# Patient Record
Sex: Male | Born: 1940
Health system: Southern US, Community
[De-identification: ages and names within clinical notes are randomized; demographics above are authoritative.]

## PROBLEM LIST (undated history)

## (undated) DIAGNOSIS — I447 Left bundle-branch block, unspecified: Secondary | ICD-10-CM

## (undated) DIAGNOSIS — M1990 Unspecified osteoarthritis, unspecified site: Secondary | ICD-10-CM

## (undated) DIAGNOSIS — G4733 Obstructive sleep apnea (adult) (pediatric): Secondary | ICD-10-CM

## (undated) DIAGNOSIS — I712 Thoracic aortic aneurysm, without rupture: Secondary | ICD-10-CM

## (undated) DIAGNOSIS — I350 Nonrheumatic aortic (valve) stenosis: Secondary | ICD-10-CM

## (undated) DIAGNOSIS — I428 Other cardiomyopathies: Secondary | ICD-10-CM

## (undated) DIAGNOSIS — Z95810 Presence of automatic (implantable) cardiac defibrillator: Secondary | ICD-10-CM

## (undated) DIAGNOSIS — E119 Type 2 diabetes mellitus without complications: Secondary | ICD-10-CM

## (undated) DIAGNOSIS — K8020 Calculus of gallbladder without cholecystitis without obstruction: Secondary | ICD-10-CM

## (undated) DIAGNOSIS — I7121 Aneurysm of the ascending aorta, without rupture: Secondary | ICD-10-CM

## (undated) DIAGNOSIS — I10 Essential (primary) hypertension: Secondary | ICD-10-CM

## (undated) DIAGNOSIS — I4891 Unspecified atrial fibrillation: Secondary | ICD-10-CM

## (undated) HISTORY — DX: Aneurysm of the ascending aorta, without rupture: I71.21

## (undated) HISTORY — DX: Essential (primary) hypertension: I10

## (undated) HISTORY — DX: Nonrheumatic aortic (valve) stenosis: I35.0

## (undated) HISTORY — PX: MEDIAN STERNOTOMY: SUR860

## (undated) HISTORY — PX: TONSILLECTOMY: SUR1361

## (undated) HISTORY — DX: Left bundle-branch block, unspecified: I44.7

## (undated) HISTORY — PX: AORTIC VALVE REPLACEMENT: SHX41

## (undated) HISTORY — DX: Obstructive sleep apnea (adult) (pediatric): G47.33

## (undated) HISTORY — DX: Presence of automatic (implantable) cardiac defibrillator: Z95.810

## (undated) HISTORY — DX: Thoracic aortic aneurysm, without rupture: I71.2

## (undated) HISTORY — DX: Unspecified atrial fibrillation: I48.91

## (undated) HISTORY — DX: Calculus of gallbladder without cholecystitis without obstruction: K80.20

## (undated) HISTORY — DX: Unspecified osteoarthritis, unspecified site: M19.90

## (undated) HISTORY — DX: Other cardiomyopathies: I42.8

## (undated) HISTORY — PX: APPENDECTOMY: SHX54

## (undated) HISTORY — PX: CHOLECYSTECTOMY: SHX55

---

## 1999-07-13 ENCOUNTER — Encounter: Payer: Self-pay | Admitting: Family Medicine

## 1999-07-13 ENCOUNTER — Encounter: Admission: RE | Admit: 1999-07-13 | Discharge: 1999-07-13 | Payer: Self-pay | Admitting: Family Medicine

## 1999-07-31 ENCOUNTER — Ambulatory Visit (HOSPITAL_COMMUNITY): Admission: RE | Admit: 1999-07-31 | Discharge: 1999-07-31 | Payer: Self-pay | Admitting: Neurosurgery

## 1999-07-31 ENCOUNTER — Encounter: Payer: Self-pay | Admitting: Neurosurgery

## 1999-08-04 HISTORY — PX: CERVICAL DISCECTOMY: SHX98

## 1999-08-06 ENCOUNTER — Encounter: Payer: Self-pay | Admitting: Neurosurgery

## 1999-08-10 ENCOUNTER — Inpatient Hospital Stay (HOSPITAL_COMMUNITY): Admission: RE | Admit: 1999-08-10 | Discharge: 1999-08-11 | Payer: Self-pay | Admitting: Neurosurgery

## 1999-08-10 ENCOUNTER — Encounter: Payer: Self-pay | Admitting: Neurosurgery

## 1999-09-02 ENCOUNTER — Encounter: Admission: RE | Admit: 1999-09-02 | Discharge: 1999-09-02 | Payer: Self-pay | Admitting: Neurosurgery

## 1999-09-02 ENCOUNTER — Encounter: Payer: Self-pay | Admitting: Neurosurgery

## 1999-10-02 ENCOUNTER — Encounter: Payer: Self-pay | Admitting: Neurosurgery

## 1999-10-02 ENCOUNTER — Encounter: Admission: RE | Admit: 1999-10-02 | Discharge: 1999-10-02 | Payer: Self-pay | Admitting: Neurosurgery

## 2000-04-13 ENCOUNTER — Ambulatory Visit (HOSPITAL_BASED_OUTPATIENT_CLINIC_OR_DEPARTMENT_OTHER): Admission: RE | Admit: 2000-04-13 | Discharge: 2000-04-13 | Payer: Self-pay | Admitting: Orthopedic Surgery

## 2004-02-14 ENCOUNTER — Ambulatory Visit: Payer: Self-pay | Admitting: Internal Medicine

## 2004-12-23 ENCOUNTER — Encounter: Admission: RE | Admit: 2004-12-23 | Discharge: 2004-12-23 | Payer: Self-pay | Admitting: Internal Medicine

## 2005-08-12 ENCOUNTER — Emergency Department (HOSPITAL_COMMUNITY): Admission: EM | Admit: 2005-08-12 | Discharge: 2005-08-12 | Payer: Self-pay | Admitting: Emergency Medicine

## 2007-03-14 ENCOUNTER — Inpatient Hospital Stay (HOSPITAL_COMMUNITY): Admission: EM | Admit: 2007-03-14 | Discharge: 2007-03-16 | Payer: Self-pay | Admitting: Emergency Medicine

## 2007-07-28 ENCOUNTER — Inpatient Hospital Stay (HOSPITAL_COMMUNITY): Admission: EM | Admit: 2007-07-28 | Discharge: 2007-08-15 | Payer: Self-pay | Admitting: Emergency Medicine

## 2007-07-31 ENCOUNTER — Ambulatory Visit: Payer: Self-pay | Admitting: Thoracic Surgery (Cardiothoracic Vascular Surgery)

## 2007-07-31 ENCOUNTER — Encounter: Payer: Self-pay | Admitting: Cardiology

## 2007-08-01 ENCOUNTER — Encounter (INDEPENDENT_AMBULATORY_CARE_PROVIDER_SITE_OTHER): Payer: Self-pay | Admitting: *Deleted

## 2007-08-01 ENCOUNTER — Encounter: Payer: Self-pay | Admitting: Thoracic Surgery (Cardiothoracic Vascular Surgery)

## 2007-08-02 ENCOUNTER — Ambulatory Visit: Payer: Self-pay | Admitting: Surgery

## 2007-08-07 ENCOUNTER — Encounter: Payer: Self-pay | Admitting: Thoracic Surgery (Cardiothoracic Vascular Surgery)

## 2007-08-25 ENCOUNTER — Emergency Department (HOSPITAL_COMMUNITY): Admission: EM | Admit: 2007-08-25 | Discharge: 2007-08-25 | Payer: Self-pay | Admitting: Emergency Medicine

## 2007-09-07 ENCOUNTER — Encounter (HOSPITAL_COMMUNITY): Admission: RE | Admit: 2007-09-07 | Discharge: 2007-11-04 | Payer: Self-pay | Admitting: Cardiology

## 2007-09-07 ENCOUNTER — Ambulatory Visit: Payer: Self-pay | Admitting: Thoracic Surgery (Cardiothoracic Vascular Surgery)

## 2007-09-07 ENCOUNTER — Encounter
Admission: RE | Admit: 2007-09-07 | Discharge: 2007-09-07 | Payer: Self-pay | Admitting: Thoracic Surgery (Cardiothoracic Vascular Surgery)

## 2007-09-18 ENCOUNTER — Inpatient Hospital Stay (HOSPITAL_COMMUNITY): Admission: EM | Admit: 2007-09-18 | Discharge: 2007-09-22 | Payer: Self-pay | Admitting: Emergency Medicine

## 2007-09-29 ENCOUNTER — Encounter
Admission: RE | Admit: 2007-09-29 | Discharge: 2007-09-29 | Payer: Self-pay | Admitting: Thoracic Surgery (Cardiothoracic Vascular Surgery)

## 2007-09-29 ENCOUNTER — Ambulatory Visit: Payer: Self-pay | Admitting: Thoracic Surgery (Cardiothoracic Vascular Surgery)

## 2007-11-06 ENCOUNTER — Ambulatory Visit: Payer: Self-pay | Admitting: Thoracic Surgery (Cardiothoracic Vascular Surgery)

## 2007-11-06 ENCOUNTER — Encounter
Admission: RE | Admit: 2007-11-06 | Discharge: 2007-11-06 | Payer: Self-pay | Admitting: Thoracic Surgery (Cardiothoracic Vascular Surgery)

## 2008-03-18 ENCOUNTER — Ambulatory Visit: Payer: Self-pay | Admitting: Thoracic Surgery (Cardiothoracic Vascular Surgery)

## 2008-03-18 ENCOUNTER — Encounter
Admission: RE | Admit: 2008-03-18 | Discharge: 2008-03-18 | Payer: Self-pay | Admitting: Thoracic Surgery (Cardiothoracic Vascular Surgery)

## 2008-06-22 ENCOUNTER — Encounter: Payer: Self-pay | Admitting: Cardiology

## 2008-08-05 ENCOUNTER — Ambulatory Visit: Payer: Self-pay | Admitting: Thoracic Surgery (Cardiothoracic Vascular Surgery)

## 2008-11-21 ENCOUNTER — Encounter: Payer: Self-pay | Admitting: Cardiology

## 2008-11-27 ENCOUNTER — Ambulatory Visit (HOSPITAL_COMMUNITY): Admission: RE | Admit: 2008-11-27 | Discharge: 2008-11-27 | Payer: Self-pay | Admitting: Cardiovascular Disease

## 2008-12-04 ENCOUNTER — Encounter: Payer: Self-pay | Admitting: Cardiology

## 2008-12-06 ENCOUNTER — Encounter: Payer: Self-pay | Admitting: Cardiology

## 2008-12-09 ENCOUNTER — Encounter: Payer: Self-pay | Admitting: Cardiology

## 2009-01-03 ENCOUNTER — Encounter: Payer: Self-pay | Admitting: Cardiology

## 2009-01-22 ENCOUNTER — Encounter: Payer: Self-pay | Admitting: Cardiology

## 2009-05-06 DIAGNOSIS — R0602 Shortness of breath: Secondary | ICD-10-CM

## 2009-05-06 DIAGNOSIS — M1990 Unspecified osteoarthritis, unspecified site: Secondary | ICD-10-CM | POA: Insufficient documentation

## 2009-05-06 DIAGNOSIS — I447 Left bundle-branch block, unspecified: Secondary | ICD-10-CM

## 2009-05-06 DIAGNOSIS — Q231 Congenital insufficiency of aortic valve: Secondary | ICD-10-CM

## 2009-05-06 DIAGNOSIS — K8020 Calculus of gallbladder without cholecystitis without obstruction: Secondary | ICD-10-CM | POA: Insufficient documentation

## 2009-05-06 DIAGNOSIS — I359 Nonrheumatic aortic valve disorder, unspecified: Secondary | ICD-10-CM

## 2009-05-06 DIAGNOSIS — G4733 Obstructive sleep apnea (adult) (pediatric): Secondary | ICD-10-CM

## 2009-05-06 DIAGNOSIS — I959 Hypotension, unspecified: Secondary | ICD-10-CM

## 2009-05-06 DIAGNOSIS — I712 Thoracic aortic aneurysm, without rupture: Secondary | ICD-10-CM

## 2009-05-07 ENCOUNTER — Encounter: Payer: Self-pay | Admitting: Cardiology

## 2009-05-07 ENCOUNTER — Ambulatory Visit: Payer: Self-pay | Admitting: Cardiology

## 2009-05-07 DIAGNOSIS — I10 Essential (primary) hypertension: Secondary | ICD-10-CM

## 2009-05-07 DIAGNOSIS — Z954 Presence of other heart-valve replacement: Secondary | ICD-10-CM | POA: Insufficient documentation

## 2009-05-08 ENCOUNTER — Ambulatory Visit: Payer: Self-pay | Admitting: Internal Medicine

## 2009-05-08 LAB — CONVERTED CEMR LAB: POC INR: 1.9

## 2009-05-29 ENCOUNTER — Encounter (INDEPENDENT_AMBULATORY_CARE_PROVIDER_SITE_OTHER): Payer: Self-pay | Admitting: Cardiology

## 2009-05-29 ENCOUNTER — Ambulatory Visit: Payer: Self-pay | Admitting: Internal Medicine

## 2009-05-29 LAB — CONVERTED CEMR LAB: POC INR: 2.2

## 2009-06-26 ENCOUNTER — Ambulatory Visit: Payer: Self-pay | Admitting: Internal Medicine

## 2009-06-26 LAB — CONVERTED CEMR LAB: POC INR: 2

## 2009-07-16 ENCOUNTER — Encounter: Payer: Self-pay | Admitting: Cardiology

## 2009-07-16 ENCOUNTER — Ambulatory Visit: Payer: Self-pay | Admitting: Cardiology

## 2009-08-07 ENCOUNTER — Ambulatory Visit: Payer: Self-pay | Admitting: Thoracic Surgery (Cardiothoracic Vascular Surgery)

## 2009-08-07 ENCOUNTER — Encounter: Payer: Self-pay | Admitting: Cardiology

## 2009-08-08 ENCOUNTER — Ambulatory Visit: Payer: Self-pay | Admitting: Cardiology

## 2009-08-08 LAB — CONVERTED CEMR LAB: POC INR: 3.3

## 2009-08-29 ENCOUNTER — Ambulatory Visit: Payer: Self-pay | Admitting: Cardiovascular Disease

## 2009-09-17 ENCOUNTER — Encounter: Payer: Self-pay | Admitting: Cardiology

## 2009-09-17 ENCOUNTER — Encounter (INDEPENDENT_AMBULATORY_CARE_PROVIDER_SITE_OTHER): Payer: Self-pay | Admitting: *Deleted

## 2009-09-17 ENCOUNTER — Ambulatory Visit: Payer: Self-pay | Admitting: Cardiology

## 2009-09-26 ENCOUNTER — Ambulatory Visit: Payer: Self-pay | Admitting: Cardiology

## 2009-10-10 ENCOUNTER — Telehealth (INDEPENDENT_AMBULATORY_CARE_PROVIDER_SITE_OTHER): Payer: Self-pay | Admitting: *Deleted

## 2009-10-24 ENCOUNTER — Ambulatory Visit: Payer: Self-pay | Admitting: Cardiology

## 2009-10-24 LAB — CONVERTED CEMR LAB: POC INR: 1.7

## 2009-11-25 ENCOUNTER — Ambulatory Visit: Payer: Self-pay | Admitting: Cardiovascular Disease

## 2009-11-25 LAB — CONVERTED CEMR LAB: POC INR: 2.1

## 2009-11-26 ENCOUNTER — Encounter: Payer: Self-pay | Admitting: Cardiology

## 2009-11-26 ENCOUNTER — Ambulatory Visit: Payer: Self-pay | Admitting: Cardiology

## 2009-11-28 ENCOUNTER — Ambulatory Visit (HOSPITAL_COMMUNITY): Admission: RE | Admit: 2009-11-28 | Discharge: 2009-11-28 | Payer: Self-pay | Admitting: Cardiology

## 2009-11-28 ENCOUNTER — Ambulatory Visit: Payer: Self-pay

## 2009-11-28 ENCOUNTER — Encounter: Payer: Self-pay | Admitting: Cardiology

## 2009-11-28 ENCOUNTER — Ambulatory Visit: Payer: Self-pay | Admitting: Internal Medicine

## 2009-12-15 ENCOUNTER — Encounter (INDEPENDENT_AMBULATORY_CARE_PROVIDER_SITE_OTHER): Payer: Self-pay | Admitting: *Deleted

## 2009-12-15 ENCOUNTER — Telehealth: Payer: Self-pay | Admitting: Cardiology

## 2009-12-16 ENCOUNTER — Telehealth (INDEPENDENT_AMBULATORY_CARE_PROVIDER_SITE_OTHER): Payer: Self-pay | Admitting: *Deleted

## 2009-12-24 ENCOUNTER — Ambulatory Visit: Payer: Self-pay | Admitting: Cardiology

## 2009-12-24 ENCOUNTER — Encounter: Payer: Self-pay | Admitting: Cardiology

## 2009-12-26 LAB — CONVERTED CEMR LAB
BUN: 18 mg/dL (ref 6–23)
Creatinine, Ser: 0.9 mg/dL (ref 0.4–1.5)
GFR calc non Af Amer: 88.64 mL/min (ref >60)
Potassium: 4.2 meq/L (ref 3.5–5.1)
Pro B Natriuretic peptide (BNP): 120.4 pg/mL — ABNORMAL HIGH (ref 0.0–100.0)

## 2010-01-01 ENCOUNTER — Ambulatory Visit: Payer: Self-pay | Admitting: Cardiology

## 2010-01-13 ENCOUNTER — Ambulatory Visit: Payer: Self-pay | Admitting: Internal Medicine

## 2010-01-26 ENCOUNTER — Ambulatory Visit: Payer: Self-pay | Admitting: Internal Medicine

## 2010-01-26 DIAGNOSIS — I5022 Chronic systolic (congestive) heart failure: Secondary | ICD-10-CM

## 2010-02-04 ENCOUNTER — Encounter: Payer: Self-pay | Admitting: Internal Medicine

## 2010-02-09 ENCOUNTER — Ambulatory Visit: Payer: Self-pay | Admitting: Cardiovascular Disease

## 2010-02-09 ENCOUNTER — Encounter (INDEPENDENT_AMBULATORY_CARE_PROVIDER_SITE_OTHER): Payer: Self-pay | Admitting: *Deleted

## 2010-02-10 ENCOUNTER — Telehealth: Payer: Self-pay | Admitting: Internal Medicine

## 2010-02-18 ENCOUNTER — Ambulatory Visit: Payer: Self-pay | Admitting: Cardiology

## 2010-02-18 ENCOUNTER — Encounter: Payer: Self-pay | Admitting: Cardiology

## 2010-03-02 ENCOUNTER — Ambulatory Visit: Payer: Self-pay | Admitting: Internal Medicine

## 2010-03-02 LAB — CONVERTED CEMR LAB
Basophils Relative: 0.3 % (ref 0.0–3.0)
CO2: 31 meq/L (ref 19–32)
Chloride: 101 meq/L (ref 96–112)
Eosinophils Absolute: 0.2 10*3/uL (ref 0.0–0.7)
Hemoglobin: 16 g/dL (ref 13.0–17.0)
INR: 2.7 — ABNORMAL HIGH (ref 0.8–1.0)
MCHC: 35 g/dL (ref 30.0–36.0)
MCV: 91.4 fL (ref 78.0–100.0)
Monocytes Absolute: 0.5 10*3/uL (ref 0.1–1.0)
Neutro Abs: 5.6 10*3/uL (ref 1.4–7.7)
RBC: 5 M/uL (ref 4.22–5.81)
Sodium: 140 meq/L (ref 135–145)

## 2010-03-05 ENCOUNTER — Ambulatory Visit: Payer: Self-pay | Admitting: Internal Medicine

## 2010-03-05 ENCOUNTER — Inpatient Hospital Stay (HOSPITAL_COMMUNITY)
Admission: RE | Admit: 2010-03-05 | Discharge: 2010-03-08 | Payer: Self-pay | Source: Home / Self Care | Admitting: Internal Medicine

## 2010-03-09 ENCOUNTER — Ambulatory Visit: Payer: Self-pay | Admitting: Cardiovascular Disease

## 2010-03-13 ENCOUNTER — Encounter: Payer: Self-pay | Admitting: Internal Medicine

## 2010-03-17 ENCOUNTER — Encounter: Payer: Self-pay | Admitting: Internal Medicine

## 2010-03-17 ENCOUNTER — Encounter: Payer: Self-pay | Admitting: Cardiology

## 2010-03-17 ENCOUNTER — Ambulatory Visit: Payer: Self-pay | Admitting: Cardiology

## 2010-03-17 LAB — CONVERTED CEMR LAB
CO2: 31 meq/L (ref 19–32)
Calcium: 9.3 mg/dL (ref 8.4–10.5)
Chloride: 102 meq/L (ref 96–112)
Glucose, Bld: 162 mg/dL — ABNORMAL HIGH (ref 70–99)
Magnesium: 2.2 mg/dL (ref 1.5–2.5)
Potassium: 4.2 meq/L (ref 3.5–5.1)
Sodium: 140 meq/L (ref 135–145)

## 2010-04-02 IMAGING — CR DG CHEST 1V PORT
1 series · 1 of 1 positions shown · non-contrast
Comparison: 07/28/2007

CLINICAL DATA: CHF, respiratory distress.

PORTABLE CHEST - 1 VIEW

[AP]
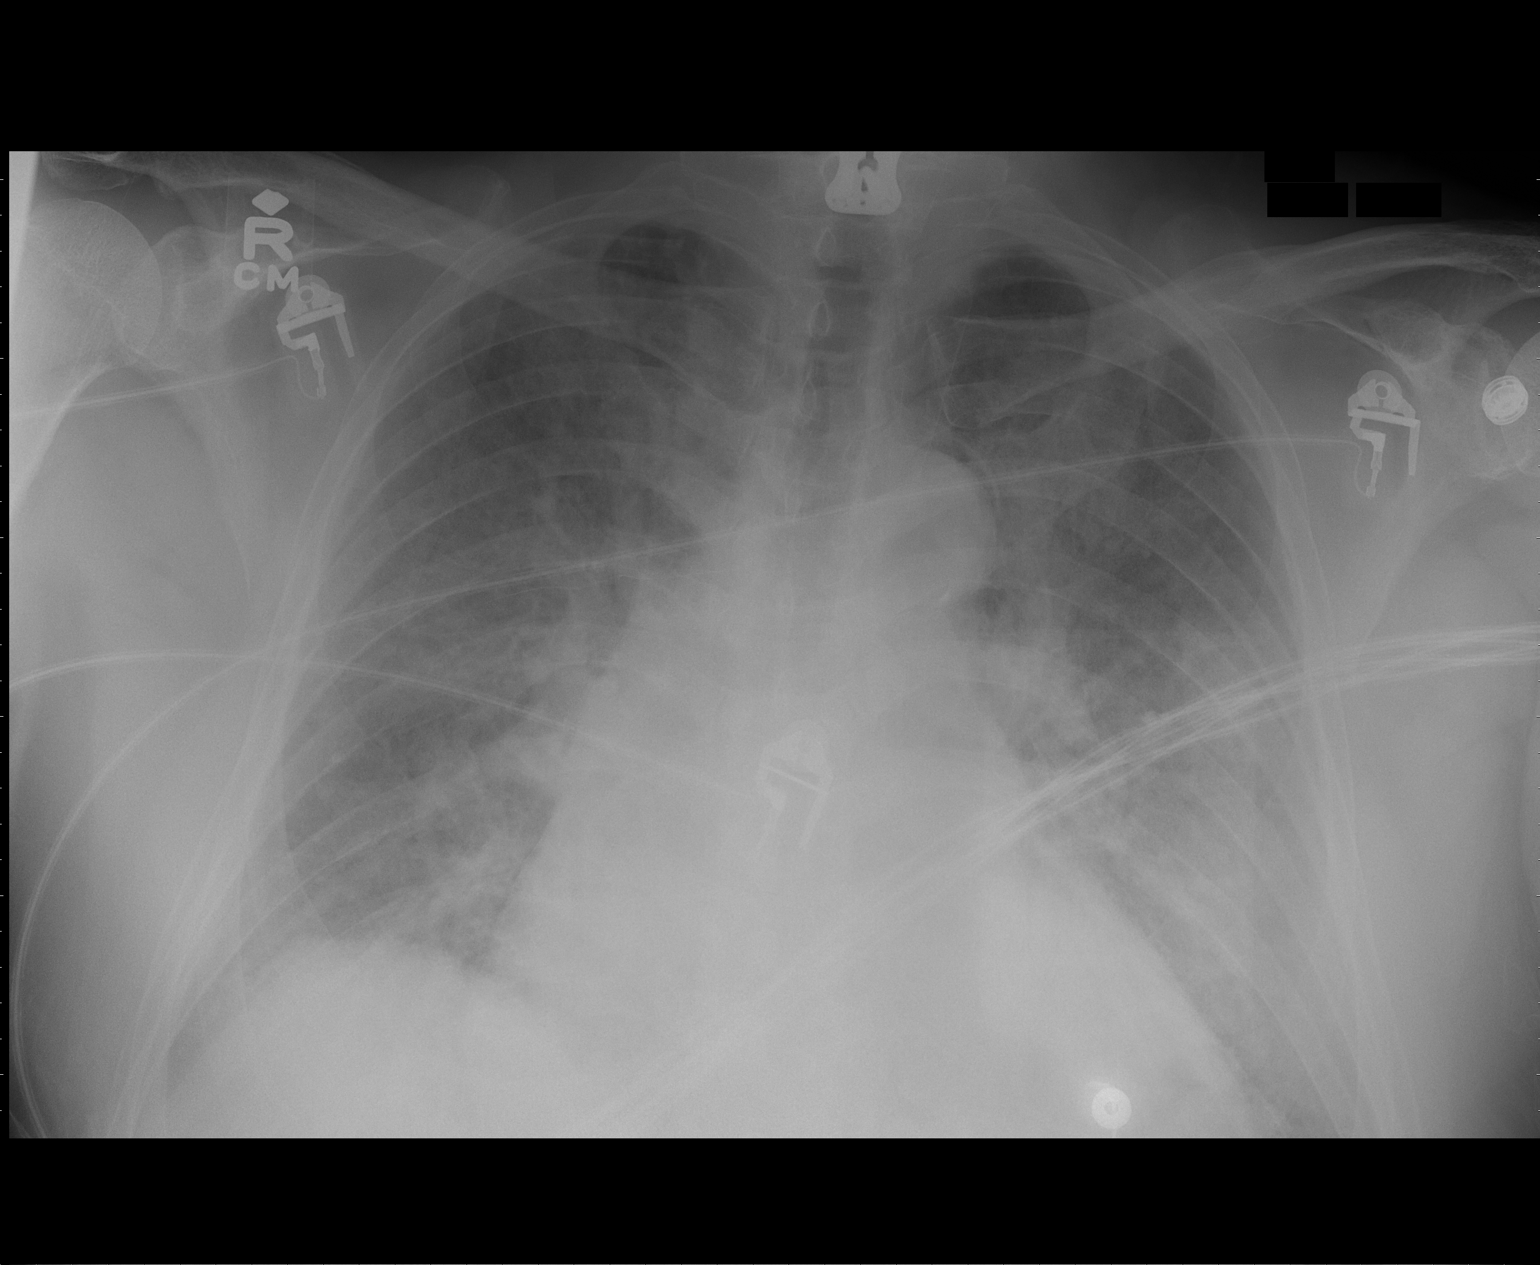

[1 of 1 positions shown; findings below may reference images not displayed]

FINDINGS: Compared to prior exam, cardiomegaly is unchanged.  There
has been an increase in pulmonary edema, most evident in the upper
lobes bilaterally.  Left costophrenic angle excluded from view.
Pulmonary edema is now graded as moderate to severe.  Lower
cervical ACDF again noted.
IMPRESSION: 1.  Increasing pulmonary edema/congestive heart failure.

## 2010-04-08 ENCOUNTER — Ambulatory Visit: Admission: RE | Admit: 2010-04-08 | Discharge: 2010-04-08 | Payer: Self-pay | Source: Home / Self Care

## 2010-04-08 IMAGING — CR DG CHEST 1V PORT
1 series · 1 of 1 positions shown · non-contrast
Comparison: 08/02/2007

CLINICAL DATA: Status post CABG.  CHF, AVR, MAZE

PORTABLE CHEST - 1 VIEW

[AP]
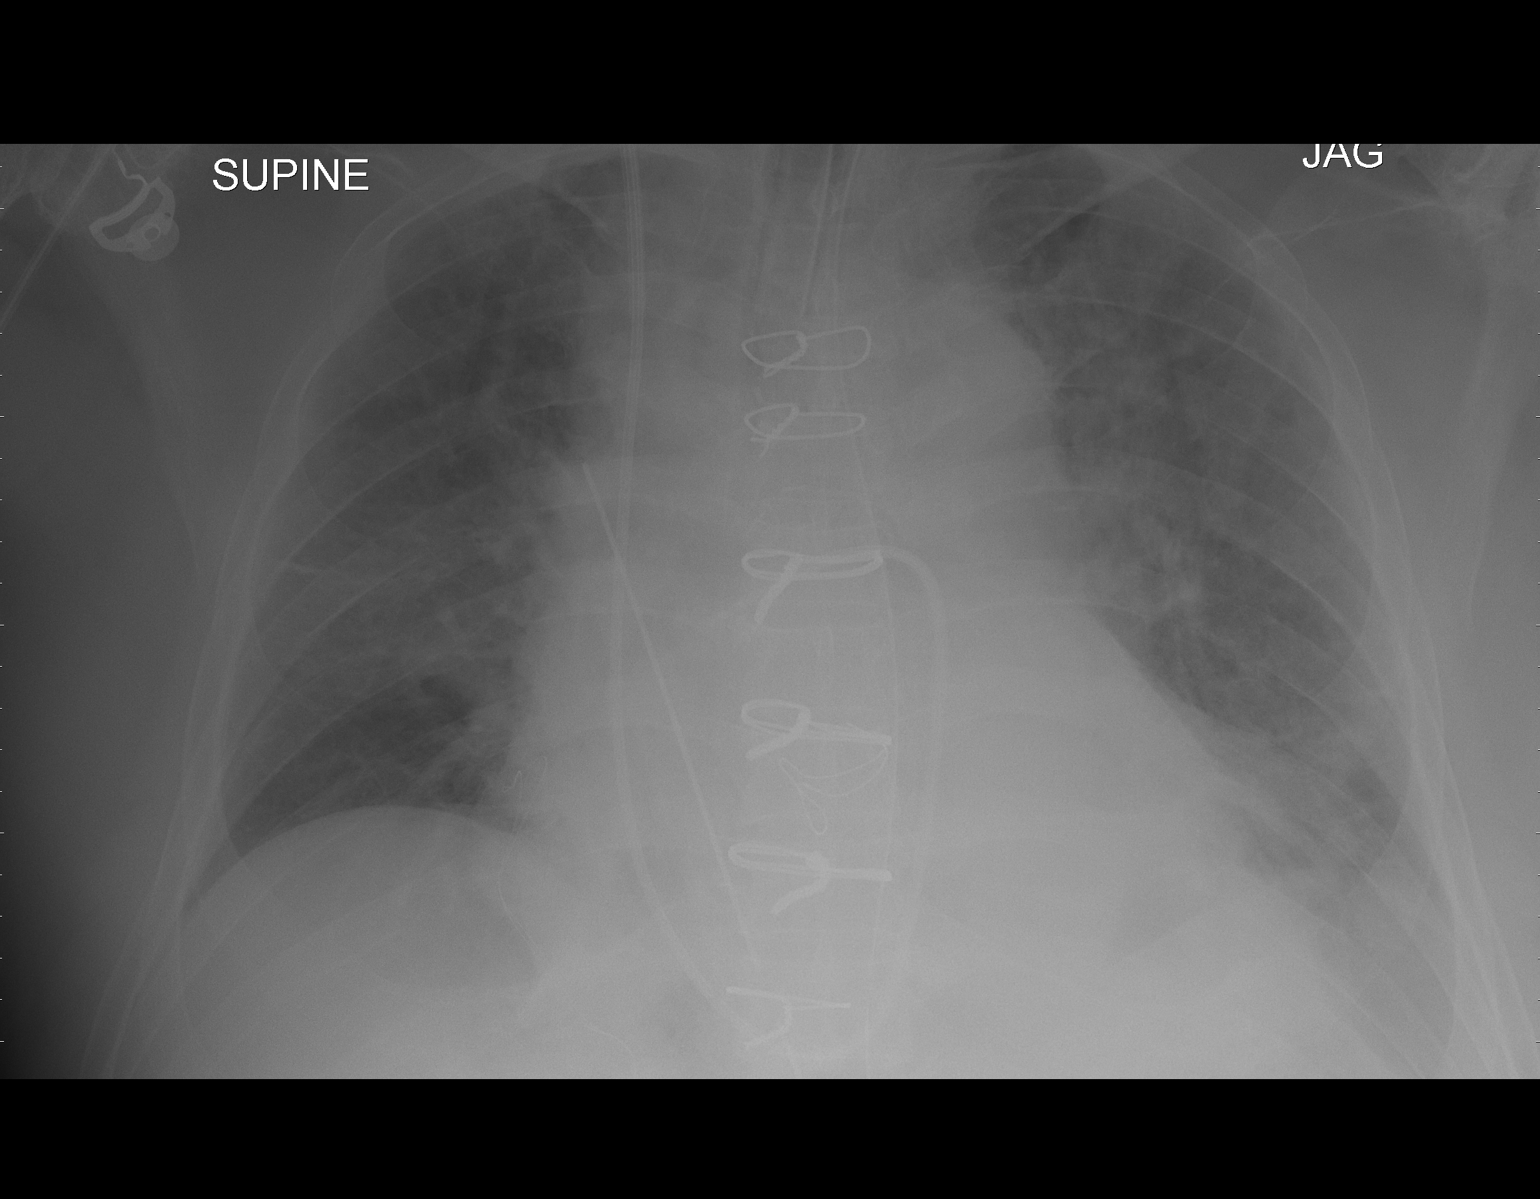

[1 of 1 positions shown; findings below may reference images not displayed]

FINDINGS: Patient has had a median sternotomy and CABG.
Endotracheal tube is in place with tip 5.6 cm above carina.
Mediastinal drains are in place.  A Swan-Ganz catheter tip overlies
the level of the right main pulmonary artery.  The heart is
enlarged.  There are patchy densities especially in the perihilar
regions, consistent with mild edema.  Left lower lobe atelectasis
is suspected.
IMPRESSION: Postoperative appearance of the chest.  Cardiomegaly and perihilar
edema.

## 2010-04-09 IMAGING — CR DG CHEST 1V PORT
1 series · 1 of 1 positions shown · non-contrast
Comparison: Chest radiograph 08/07/2007

CLINICAL DATA: Congestive heart failure

PORTABLE CHEST - 1 VIEW

[view not recorded]
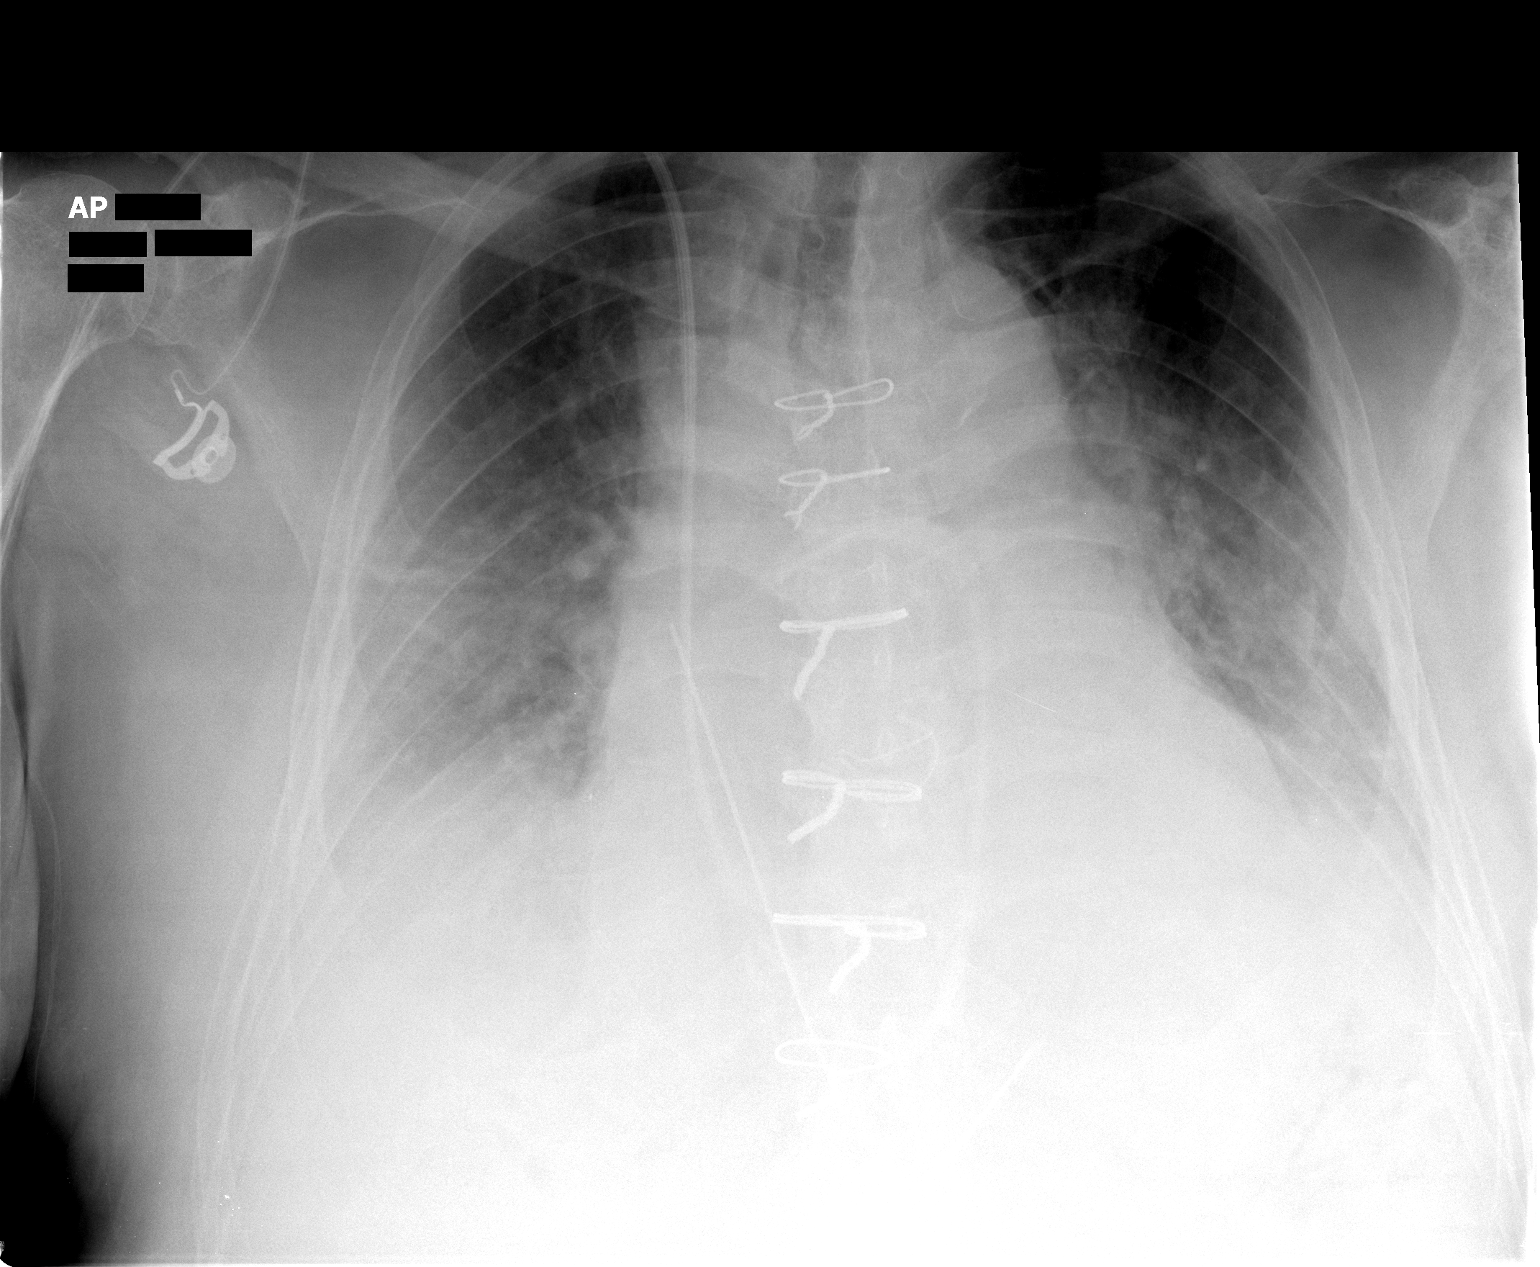

[1 of 1 positions shown; findings below may reference images not displayed]

FINDINGS: Sternotomy wires overlie stable enlarged cardiac
silhouette.  Interval extubation.  There is increase in hazy
opacity over the bilateral lung bases.  Mediastinal drain remains.
Swan-Ganz catheter unchanged.  No pneumothorax.  Mild to moderate
central venous, congestion.
IMPRESSION: 1.  Interval extubation with increase in bibasilar atelectasis and
effusions.
2.  Stable central venous congestion.

## 2010-04-11 IMAGING — CR DG CHEST 2V
2 series · 2 of 2 positions shown · non-contrast
Comparison: 08/09/2007 study.

CLINICAL DATA: Coronary artery bypass grafting.  Follow-up. History
of aortic valvular replacement.

CHEST - 2 VIEW

[w chest pa]
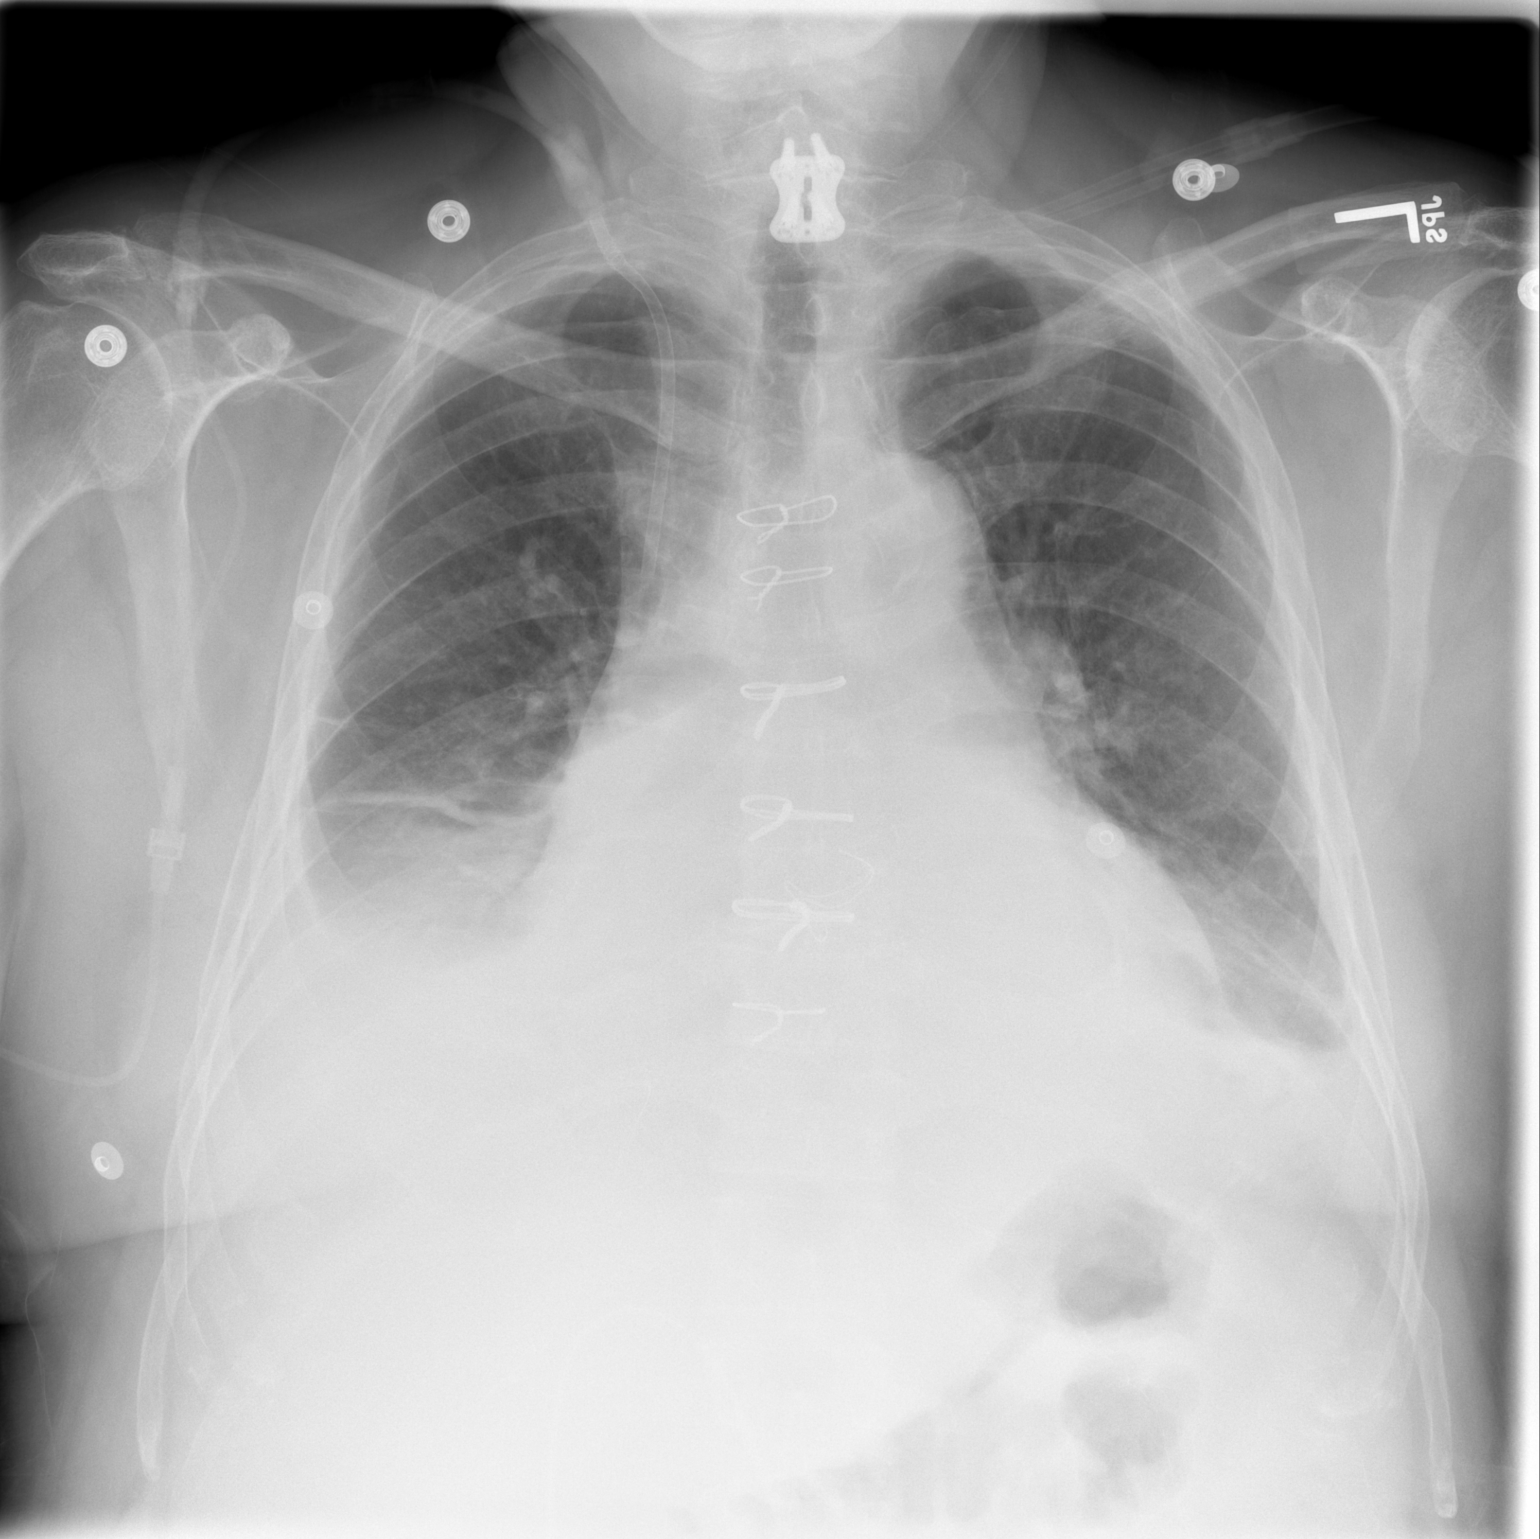

[w chest lat]
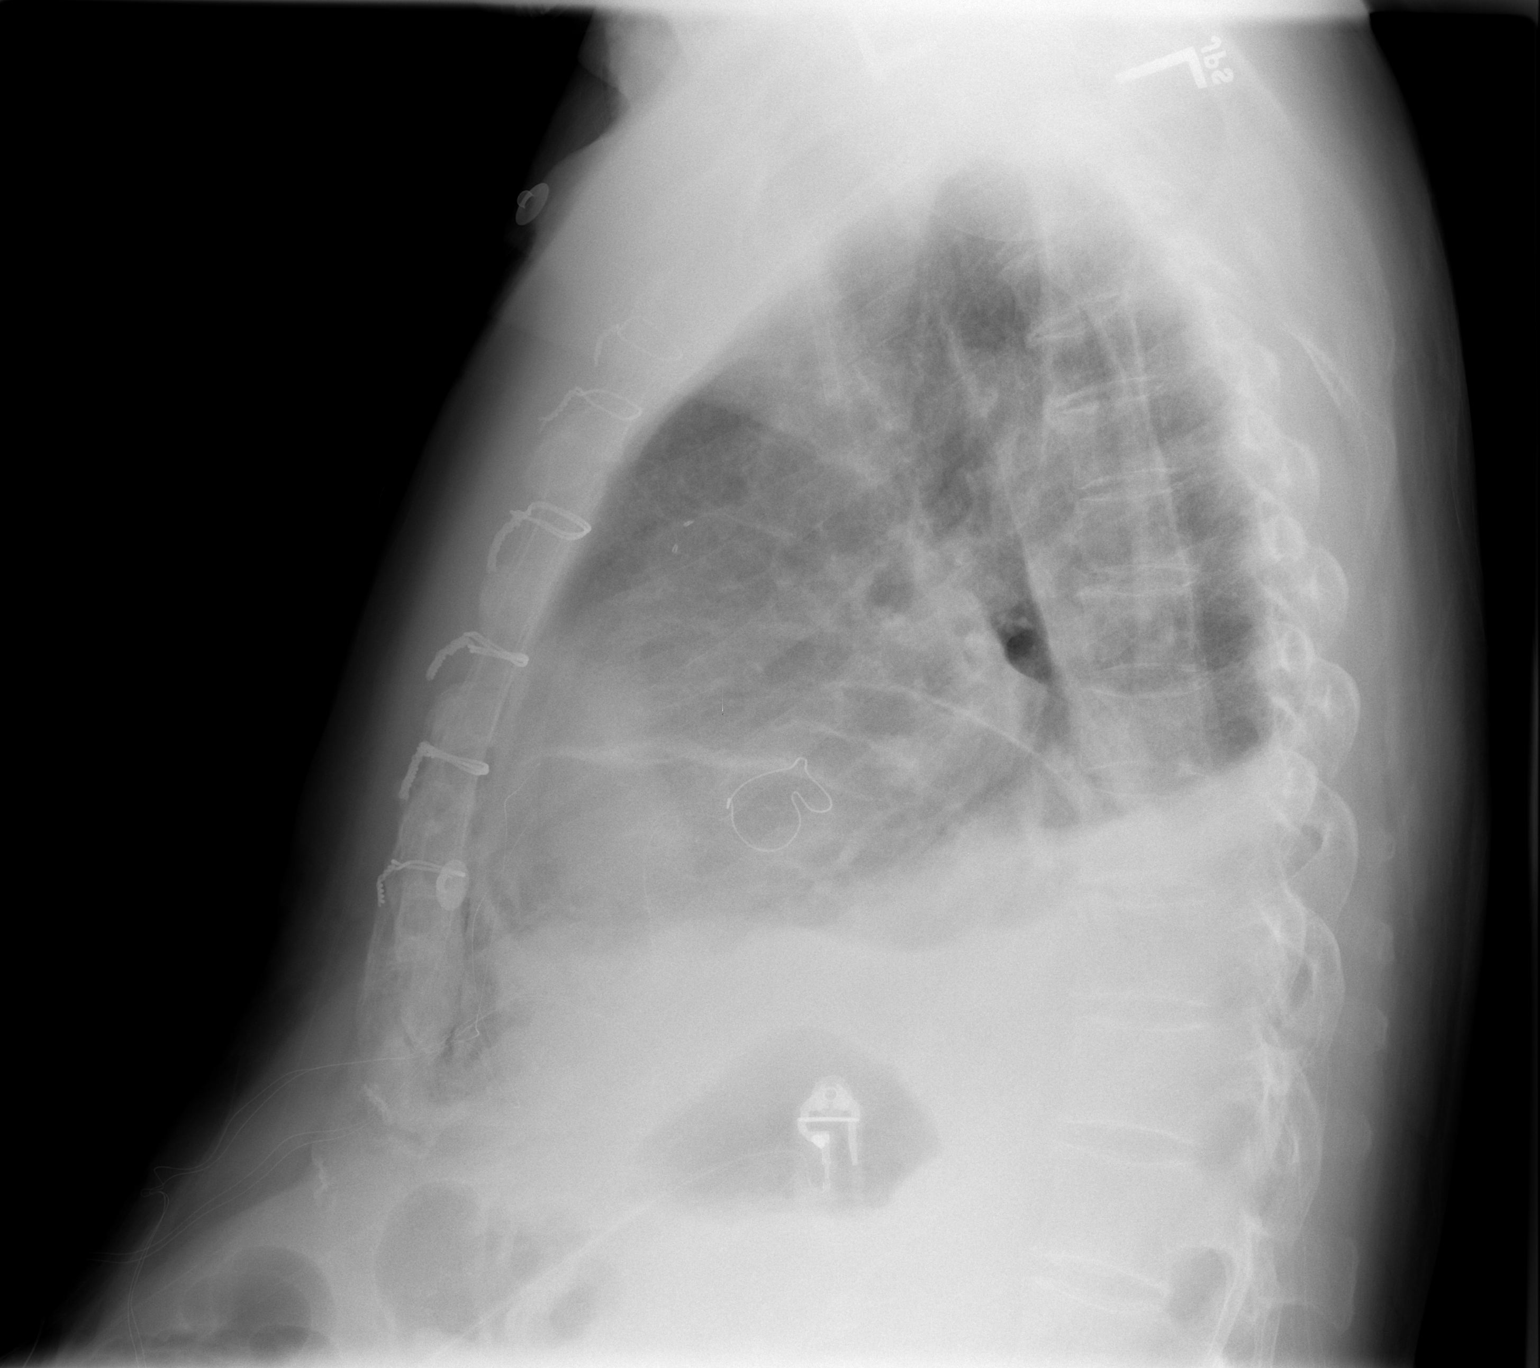

[2 of 2 positions shown; findings below may reference images not displayed]

FINDINGS: Previous median sternotomy and valvular replacement
procedure has been performed.  Previous lower cervical surgery has
been performed with hardware in place.

A venous catheter is seen in place with its tip in the superior
vena cava with no evidence of pneumothorax.

There is continued enlargement of the cardiac silhouette.  There is
bilateral basilar atelectasis and pleural effusion formation more
on the right than the left.  However, there is improved aeration of
the lungs in the mid upper portions compared to the prior study.
IMPRESSION: Continued enlargement of the cardiac silhouette with bilateral
basilar atelectasis and pleural effusion formation.  Improved
aeration of the mid and upper lung zones with less vascular
congestion pattern than on the previous study.

## 2010-04-26 ENCOUNTER — Encounter: Payer: Self-pay | Admitting: Thoracic Surgery (Cardiothoracic Vascular Surgery)

## 2010-05-05 NOTE — Medication Information (Signed)
Summary: [name]  Anticoagulant Therapy  Managed by: Weston Brass, PharmD Referring MD: Jens Som MD, Beryle Lathe MD: Gala Romney MD, Reuel Boom Indication 1: Atrial Fibrillation Lab Used: [facility] [place] Site: [address] INR POC 1.9 INR RANGE 2.0-3.0  Dietary changes: yes       Details: Was drinking 2 glasses of wine previously, but has now stopped all alcohol  Health status changes: no    Bleeding/hemorrhagic complications: no    Recent/future hospitalizations: no    Any changes in medication regimen? no    Recent/future dental: no  Any missed doses?: no       Is patient compliant with meds? yes       Allergies: No Known Drug Allergies  Anticoagulation Management History:      The patient is taking warfarin and comes in today for a routine follow up visit.  Positive risk factors for bleeding include an age of 71 years or older.  The bleeding index is 'intermediate risk'.  Positive CHADS2 values include History of HTN.  Negative CHADS2 values include Age > 75 years old.  Anticoagulation responsible provider: Kiearra Oyervides MD, Reuel Boom.  INR POC: 1.9.  Cuvette Lot#: [id number].  Exp: 02/2011.    Anticoagulation Management Assessment/Plan:      The patient's current anticoagulation dose is Warfarin sodium 5 mg tabs: Use as directed by Anticoagulation Clinic.  The target INR is 2.0-3.0.  The next INR is due 01/26/2010.  Anticoagulation instructions were given to patient.  Results were reviewed/authorized by Weston Brass, PharmD.  He was notified by Haynes Hoehn, PharmD Candidate.         Prior Anticoagulation Instructions: INR 1.6  Take 1 extra tablet today (Thursday). Then change dose to 1 tablet everyday. Re-check INR in 10 days.   Current Anticoagulation Instructions: INR 1.9  Take Coumadin 1 tablet every day of the week, except 1 and 1/2 tablets on Wednesday.  Return to clinic in 2 weeks.

## 2010-05-05 NOTE — Cardiovascular Report (Signed)
Summary: Pre Op Orders   Pre Op Orders   Imported By: Roderic Ovens 02/23/2010 16:44:35  _____________________________________________________________________  External Attachment:    Type:   Image     Comment:   External Document

## 2010-05-05 NOTE — Medication Information (Signed)
Summary: [email or web address]  Anticoagulant Therapy  Managed by: Charolotte Eke, PharmD Referring MD: Jens Som MD, Beryle Lathe MD: Gala Romney MD, Reuel Boom Indication 1: Atrial Fibrillation Lab Used: [facility] [place] Site: [address] INR POC 2.0 INR RANGE 2.0-3.0  Dietary changes: no    Health status changes: no    Bleeding/hemorrhagic complications: no    Recent/future hospitalizations: no    Any changes in medication regimen? no    Recent/future dental: no  Any missed doses?: no       Is patient compliant with meds? yes       Current Medications (verified): 1)  Diltiazem Hcl Er Beads 240 Mg Xr24h-Cap (Diltiazem Hcl Er Beads) .... Take One Capsule By Mouth Daily 2)  Lisinopril-Hydrochlorothiazide 20-12.5 Mg Tabs (Lisinopril-Hydrochlorothiazide) .... Take 1 Tablet By Mouth Once A Day 3)  Digoxin 0.125 Mg Tabs (Digoxin) .... Take A Half  Tablet By Mouth Daily 4)  Warfarin Sodium 5 Mg Tabs (Warfarin Sodium) .... Use As Directed By Anticoagulation Clinic 5)  Klor-Con M20 20 Meq Cr-Tabs (Potassium Chloride Crys Cr) .... Take 1 Tablet By Mouth Once A Day  Allergies (verified): No Known Drug Allergies  Anticoagulation Management History:      Positive risk factors for bleeding include an age of 31 years or older.  The bleeding index is 'intermediate risk'.  Positive CHADS2 values include History of HTN.  Negative CHADS2 values include Age > 72 years old.  Anticoagulation responsible provider: Bensimhon MD, Reuel Boom.  INR POC: 2.0.  Exp: 07/2010.    Anticoagulation Management Assessment/Plan:      The patient's current anticoagulation dose is Warfarin sodium 5 mg tabs: Use as directed by Anticoagulation Clinic.  The next INR is due 07/24/2009.  Anticoagulation instructions were given to patient.  Results were reviewed/authorized by Charolotte Eke, PharmD.  He was notified by Charolotte Eke, PharmD.         Prior Anticoagulation Instructions: INR 2.2  Continue 1 tab daily  except 0.5 tab each Wednesday.  Recheck in 4 weeks.    Current Anticoagulation Instructions: The patient is to continue with the same dose of coumadin.  This dosage includes: 5mg  daily except 2.5mg  on Wed.

## 2010-05-05 NOTE — Assessment & Plan Note (Signed)
Summary: [facility]   Visit Type:  2 months follow up  CC:  No cardiac complains.  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 Note a cardiac catheterization in April of 2009 prior to his procedure showed an ejection fraction of 20%, no coronary disease, a dilated aortic root, moderate aortic stenosis and mild mitral regurgitation. The patient subsequently had aortic valve replacement with 25 mm Carpentier-Edwards Perimount valve, placement of ascending aortic aneurysm with 38-mm Hemashield graft, modified Cox maze III atrial ablation. Note he did develop atrial fibrillation 6 months after his procedure. A cardioversion was attempted by his report but he only held sinus for one week. He has been in permanent atrial fibrillation for the past 10-12 months. Patient previously followed by [facility] heart [facility] vascular but would prefer to be followed here. Last echocardiogram in [place] during an admission for cholecystitis was performed in September of 2010. Ejection fraction was 30-35%, moderate mitral regurgitation and mild aortic insufficiency. There was mild aortic stenosis by report. I last saw him in February of 2011. Since then the patient denies any dyspnea on exertion, orthopnea, PND, pedal edema, palpitations, syncope, chest pain or bleeding.   Current Medications (verified): 1)  Diltiazem Hcl Er Beads 240 Mg Xr24h-Cap (Diltiazem Hcl Er Beads) .... Take One Capsule By Mouth Daily 2)  Lisinopril-Hydrochlorothiazide 20-12.5 Mg Tabs (Lisinopril-Hydrochlorothiazide) .... Take 1 Tablet By Mouth Once A Day 3)  Digoxin 0.125 Mg Tabs (Digoxin) .... Take A Half  Tablet By Mouth Daily 4)  Warfarin Sodium 5 Mg Tabs (Warfarin Sodium) .... Use As Directed By Anticoagulation Clinic 5)  Klor-Con M20 20 Meq Cr-Tabs (Potassium  Chloride Crys Cr) .... Take 1 Tablet By Mouth Once A Day  Allergies (verified): No Known Drug Allergies  Past History:  Past Medical History: Reviewed history from 05/07/2009 and no changes required. 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   Past Surgical History: Reviewed history from 05/07/2009 and no changes required.  Median sternotomy extracorporeal circulation, aortic valve  replacement with 25 mm Carpentier-Edwards Perimount valve, placement of  ascending aortic aneurysm with 38-mm Hemashield graft, modified Cox maze   III atrial ablation.   repair of an ascending aortic aneurysm  Cox maze  procedure a year ago   Pleural effusion status post left-sided thoracentesis with extraction of 1.2 L of bloody fluid.   appendectomy and Tonsillectomy as a child.  Status post C7 disk removal and fusion in May 2001.   Cholecystectomy  Social History: Reviewed history from 05/07/2009 and no changes required. 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  He has two children and one grandchild.  Tobacco Use - Former.  Alcohol Use - no  Review of Systems       no fevers or chills, productive cough, hemoptysis, dysphasia, odynophagia, melena, hematochezia, dysuria, hematuria, rash, seizure activity, orthopnea, PND, pedal edema, claudication. Remaining systems are negative.   Vital Signs:  Patient profile:   70 year old male Height:      70 inches Weight:  210.50 pounds BMI:     30.31 Pulse rate:   68 / minute Pulse rhythm:   regular Resp:     18 per  minute BP sitting:   118 / 80  (left arm) Cuff size:   large  Vitals Entered By: Vikki Ports (July 16, 2009 10:00 AM)  Physical Exam  General:  Well-developed well-nourished in no acute distress.  Skin is warm and dry.  HEENT is normal.  Neck is supple. No thyromegaly.  Chest is clear to auscultation with normal expansion.  Cardiovascular exam is irregular. Crisp mechanical valve sounds. 2/6 systolic murmur but no diastolic murmur. Abdominal exam nontender or distended. No masses palpated. Extremities show no edema. neuro grossly intact    Impression & Recommendations:  Problem # 1:  FIBRILLATION, ATRIAL (ICD-427.31) Patient remains in atrial fibrillation on physical examination. Continue Coumadin with goal INR 2-3. Given the cardiomyopathy I would prefer that he be on a beta blocker. I will decrease his Cardizem to 120 mg p.o. daily and add Coreg 6.25 mg p.o. b.i.d. He will return in 8 weeks and I will try to discontinue his Cardizem and increase Coreg further. He will then need a Holter monitor to make sure that his rate is controlled. His updated medication list for this problem includes:    Digoxin 0.125 Mg Tabs (Digoxin) .Marland Kitchen... Take a half  tablet by mouth daily    Warfarin Sodium 5 Mg Tabs (Warfarin sodium) ..... Use as directed by anticoagulation clinic  Problem # 2:  COUMADIN THERAPY (ICD-V58.61) Goal INR 2-3.  Problem # 3:  ESSENTIAL HYPERTENSION, BENIGN (ICD-401.1) Blood pressure controlled on present medications. Will continue. His updated medication list for this problem includes:    Diltiazem Hcl Er Beads 120 Mg Xr24h-cap (Diltiazem hcl er beads) .Marland Kitchen... Take one capsule by mouth daily    Lisinopril-hydrochlorothiazide 20-12.5 Mg Tabs (Lisinopril-hydrochlorothiazide) .Marland Kitchen... Take 1 tablet by mouth once a day    Carvedilol 6.25 Mg Tabs (Carvedilol) .Marland Kitchen... Take one tablet by mouth twice a day  His updated medication list for this problem includes:    Diltiazem Hcl Er  Beads 120 Mg Xr24h-cap (Diltiazem hcl er beads) .Marland Kitchen... Take one capsule by mouth daily    Lisinopril-hydrochlorothiazide 20-12.5 Mg Tabs (Lisinopril-hydrochlorothiazide) .Marland Kitchen... Take 1 tablet by mouth once a day    Carvedilol 6.25 Mg Tabs (Carvedilol) .Marland Kitchen... 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 Our goal will be to increase Coreg in the future and discontinue calcium blocker altogether. He will then need a followup echocardiogram and if ejection fraction less than or equal to 35% referral to EP for consideration of ICD. His updated medication list for this problem includes:    Diltiazem Hcl Er Beads 120 Mg Xr24h-cap (Diltiazem hcl er beads) .Marland Kitchen... Take one capsule by mouth daily    Lisinopril-hydrochlorothiazide 20-12.5 Mg Tabs (Lisinopril-hydrochlorothiazide) .Marland Kitchen... Take 1 tablet by mouth once a day    Digoxin 0.125 Mg Tabs (Digoxin) .Marland Kitchen... Take a half  tablet by mouth daily    Warfarin Sodium 5 Mg Tabs (Warfarin sodium) ..... Use as directed by anticoagulation clinic    Carvedilol 6.25 Mg Tabs (Carvedilol) .Marland Kitchen... Take one tablet by mouth twice a day  Problem # 5:  AORTIC VALVE REPLACEMENT, HX OF (ICD-V43.3) Continued SBE prophylaxis.  Problem # 6:  ASCENDING AORTIC ANEURYSM (ICD-441.2) Followup MRA in the future.  Problem # 7:  SLEEP APNEA, OBSTRUCTIVE (ICD-327.23)  Patient Instructions: 1)  Your physician recommends that you schedule a follow-up appointment in: 8 WEEKS 2)  Your physician has recommended you make the following change in your medication: DECREASE DILTIAZEM TO 120MG  ONCE DAILY 3)  START CARVEDILOL 6.25MG  ONE TABLET TWICE DAILY Prescriptions: CARVEDILOL 6.25 MG TABS (CARVEDILOL) Take one tablet by mouth twice a day  #60 x 12   Entered by:   Deliah Goody, RN   Authorized by:   Ferman Hamming, MD, [facility]   Signed by:   Deliah Goody, RN on 07/16/2009    Method used:   Electronically to        [facility] [address] [facility] [address] (retail)       [address]       [place], [place]  [address]       Ph: [phone] or [phone]       Fax: [phone]   RxID:   [id number] DILTIAZEM HCL ER BEADS 120 MG XR24H-CAP (DILTIAZEM HCL ER BEADS) Take one capsule by mouth daily  #30 x 12   Entered by:   Deliah Goody, RN   Authorized by:   Ferman Hamming, MD, [facility]   Signed by:   Deliah Goody, RN on 07/16/2009   Method used:   Electronically to        [facility] [address] (retail)       [address]       [place], [place]  [address]       Ph: [phone] or [phone]       Fax: [phone]   RxID:   [id number]

## 2010-05-05 NOTE — Progress Notes (Signed)
Summary: calling about labs  Phone Note Call from Patient Call back at Home Phone [phone]   Caller: Patient Summary of Call: pt calling to schedule labs before procedure on 03/05/10 Initial call taken by: Judie Grieve,  February 10, 2010 12:58 PM  Follow-up for Phone Call        scheduled labs for 11/28 at 0830 Follow-up by: Claris Gladden RN,  February 10, 2010 1:24 PM

## 2010-05-05 NOTE — Medication Information (Signed)
Summary: rov/eac  Anticoagulant Therapy  Managed by: Weston Brass, PharmD Referring MD: Jens Som MD, Beryle Lathe MD: Eden Emms MD, Theron Arista Indication 1: Atrial Fibrillation Lab Used: [facility] [place] Site: [address] INR POC 2.1 INR RANGE 2.0-3.0  Dietary changes: no    Health status changes: no    Bleeding/hemorrhagic complications: no    Recent/future hospitalizations: no    Any changes in medication regimen? no    Recent/future dental: no  Any missed doses?: no       Is patient compliant with meds? yes       Allergies: No Known Drug Allergies  Anticoagulation Management History:      The patient is taking warfarin and comes in today for a routine follow up visit.  Positive risk factors for bleeding include an age of 71 years or older.  The bleeding index is 'intermediate risk'.  Positive CHADS2 values include History of HTN.  Negative CHADS2 values include Age > 24 years old.  Anticoagulation responsible provider: Eden Emms MD, Theron Arista.  INR POC: 2.1.  Cuvette Lot#: [id number].  Exp: 11/2010.    Anticoagulation Management Assessment/Plan:      The patient's current anticoagulation dose is Warfarin sodium 5 mg tabs: Use as directed by Anticoagulation Clinic.  The target INR is 2.0-3.0.  The next INR is due 09/26/2009.  Anticoagulation instructions were given to patient.  Results were reviewed/authorized by Weston Brass, PharmD.  He was notified by Weston Brass PharmD.         Prior Anticoagulation Instructions: INR 3.3  Do NOT take coumadin tomorrow.  Then return to normal dosing schedule of 1/2 tablet on Wednesday and 1 tablet all other days.  Return to clinic in 3 weeks.   Current Anticoagulation Instructions: INR 2.1   Continue same dose of 1 tablet every day except 1/2 tablet on Wednesday

## 2010-05-05 NOTE — Medication Information (Signed)
Summary: [facility]  Anticoagulant Therapy  Managed by: Eda Keys, PharmD Referring MD: Jens Som MD, Beryle Lathe MD: Jens Som MD, Arlys John Indication 1: Atrial Fibrillation Lab Used: [facility] [place] Site: [address] INR POC 3.3 INR RANGE 2.0-3.0  Dietary changes: no    Health status changes: no    Bleeding/hemorrhagic complications: no    Recent/future hospitalizations: no    Any changes in medication regimen? no    Recent/future dental: no  Any missed doses?: no       Is patient compliant with meds? yes       Allergies: No Known Drug Allergies  Anticoagulation Management History:      The patient is taking warfarin and comes in today for a routine follow up visit.  Positive risk factors for bleeding include an age of 41 years or older.  The bleeding index is 'intermediate risk'.  Positive CHADS2 values include History of HTN.  Negative CHADS2 values include Age > 81 years old.  Anticoagulation responsible provider: Jens Som MD, Arlys John.  INR POC: 3.3.  Cuvette Lot#: [id number].  Exp: 09/2010.    Anticoagulation Management Assessment/Plan:      The patient's current anticoagulation dose is Warfarin sodium 5 mg tabs: Use as directed by Anticoagulation Clinic.  The next INR is due 08/29/2009.  Anticoagulation instructions were given to patient.  Results were reviewed/authorized by Eda Keys, PharmD.  He was notified by Eda Keys.         Prior Anticoagulation Instructions: The patient is to continue with the same dose of coumadin.  This dosage includes: 5mg  daily except 2.5mg  on Wed.  Current Anticoagulation Instructions: INR 3.3  Do NOT take coumadin tomorrow.  Then return to normal dosing schedule of 1/2 tablet on Wednesday and 1 tablet all other days.  Return to clinic in 3 weeks.

## 2010-05-05 NOTE — Letter (Signed)
Summary: [facility]  [facility]   Imported By: Debby Freiberg 10/03/2009 13:10:18  _____________________________________________________________________  External Attachment:    Type:   Image     Comment:   External Document

## 2010-05-05 NOTE — Medication Information (Signed)
Summary: rov/sp  Anticoagulant Therapy  Managed by: Elaina Pattee, PharmD Referring MD: Jens Som MD, Beryle Lathe MD: Shirlee Latch MD, Dalton Indication 1: Atrial Fibrillation Lab Used: [facility] [place] Site: [address] INR POC 2.5 INR RANGE 2.0-3.0  Dietary changes: no    Health status changes: yes       Details: Dizziness since meds changed. Will be contacting MD today.  Bleeding/hemorrhagic complications: no    Recent/future hospitalizations: no    Any changes in medication regimen? yes       Details: Digoxin and Coreg doses increased. Diltiazem DC.   Recent/future dental: no  Any missed doses?: yes     Details: Missed dose Wednesday.  Is patient compliant with meds? yes       Current Medications (verified): 1)  Lisinopril-Hydrochlorothiazide 20-12.5 Mg Tabs (Lisinopril-Hydrochlorothiazide) .... Take 1 Tablet By Mouth Once A Day 2)  Digoxin 0.125 Mg Tabs (Digoxin) .... Take One Tablet By Mouth Once Daily 3)  Warfarin Sodium 5 Mg Tabs (Warfarin Sodium) .... Use As Directed By Anticoagulation Clinic 4)  Klor-Con M20 20 Meq Cr-Tabs (Potassium Chloride Crys Cr) .... Take 1/2 Tablet Daily. Ran Out 2 Weeks Ago 5)  Carvedilol 12.5 Mg Tabs (Carvedilol) .... Take One Tablet By Mouth Twice A Day  Allergies (verified): No Known Drug Allergies  Anticoagulation Management History:      The patient is taking warfarin and comes in today for a routine follow up visit.  Positive risk factors for bleeding include an age of 71 years or older.  The bleeding index is 'intermediate risk'.  Positive CHADS2 values include History of HTN.  Negative CHADS2 values include Age > 71 years old.  Anticoagulation responsible provider: Shirlee Latch MD, [place].  INR POC: 2.5.  Cuvette Lot#: [id number].  Exp: 11/2010.    Anticoagulation Management Assessment/Plan:      The patient's current anticoagulation dose is Warfarin sodium 5 mg tabs: Use as directed by Anticoagulation Clinic.  The  target INR is 2.0-3.0.  The next INR is due 10/24/2009.  Anticoagulation instructions were given to patient.  Results were reviewed/authorized by Elaina Pattee, PharmD.  He was notified by Elaina Pattee, PharmD.         Prior Anticoagulation Instructions: INR 2.1   Continue same dose of 1 tablet every day except 1/2 tablet on Wednesday   Current Anticoagulation Instructions: INR 2.5. Take 1 tablet daily except 0.5 tablet on Wednesdays. Recheck in 4 weeks.

## 2010-05-05 NOTE — Medication Information (Signed)
Summary: rov/sp  Anticoagulant Therapy  Managed by: Reina Fuse, PharmD Referring MD: Jens Som MD, Beryle Lathe MD: Eden Emms MD, Theron Arista Indication 1: Atrial Fibrillation Lab Used: [facility] [place] Site: [address] INR POC 2.1 INR RANGE 2.0-3.0  Dietary changes: no    Health status changes: no    Bleeding/hemorrhagic complications: no    Recent/future hospitalizations: no    Any changes in medication regimen? no    Recent/future dental: no  Any missed doses?: no       Is patient compliant with meds? yes       Allergies: No Known Drug Allergies  Anticoagulation Management History:      The patient is taking warfarin and comes in today for a routine follow up visit.  Positive risk factors for bleeding include an age of 71 years or older.  The bleeding index is 'intermediate risk'.  Positive CHADS2 values include History of HTN.  Negative CHADS2 values include Age > 21 years old.  Anticoagulation responsible provider: Eden Emms MD, Theron Arista.  INR POC: 2.1.  Cuvette Lot#: [id number].  Exp: 01/2011.    Anticoagulation Management Assessment/Plan:      The patient's current anticoagulation dose is Warfarin sodium 5 mg tabs: Use as directed by Anticoagulation Clinic.  The target INR is 2.0-3.0.  The next INR is due 12/23/2009.  Anticoagulation instructions were given to patient.  Results were reviewed/authorized by Reina Fuse, PharmD.  He was notified by Reina Fuse PharmD.         Prior Anticoagulation Instructions: INR 1.7  Take extra 1/2 tablet today then resume same dose of 1 tablet every day except 1/2 tablet on Wednesday.   Current Anticoagulation Instructions: INR 2.1  Continue taking Coumadin 1 tab (5 mg) on all days except Coumadin 0.5 tab (2.5 mg) on Wednesdays. Return to clinic in 4 weeks.

## 2010-05-05 NOTE — Letter (Signed)
Summary: [facility]  [facility]   Imported By: Erle Crocker 09/30/2009 07:54:35  _____________________________________________________________________  External Attachment:    Type:   Image     Comment:   External Document

## 2010-05-05 NOTE — Cardiovascular Report (Signed)
Summary: [facility]  [facility]   Imported By: Roderic Ovens 05/12/2009 15:43:03  _____________________________________________________________________  External Attachment:    Type:   Image     Comment:   External Document

## 2010-05-05 NOTE — Medication Information (Signed)
Summary: rov/mw  Anticoagulant Therapy  Managed by: Weston Brass, PharmD Referring MD: Jens Som MD, Beryle Lathe MD: Jens Som MD, Arlys John Indication 1: Atrial Fibrillation Lab Used: [facility] [place] Site: [address] INR POC 1.6 INR RANGE 2.0-3.0  Dietary changes: no    Health status changes: no    Bleeding/hemorrhagic complications: no    Recent/future hospitalizations: no    Any changes in medication regimen? no    Recent/future dental: no  Any missed doses?: no       Is patient compliant with meds? yes       Allergies: No Known Drug Allergies  Anticoagulation Management History:      The patient is taking warfarin and comes in today for a routine follow up visit.  Positive risk factors for bleeding include an age of 2 years or older.  The bleeding index is 'intermediate risk'.  Positive CHADS2 values include History of HTN.  Negative CHADS2 values include Age > 51 years old.  Anticoagulation responsible provider: Jens Som MD, Arlys John.  INR POC: 1.6.  Cuvette Lot#: [id number].  Exp: 01/2011.    Anticoagulation Management Assessment/Plan:      The patient's current anticoagulation dose is Warfarin sodium 5 mg tabs: Use as directed by Anticoagulation Clinic.  The target INR is 2.0-3.0.  The next INR is due 01/13/2010.  Anticoagulation instructions were given to patient.  Results were reviewed/authorized by Weston Brass, PharmD.  He was notified by Harrel Carina, PharmD candidate.         Prior Anticoagulation Instructions: INR 1.6 Today take an extra tablet in addition to the half you've already taken. Go back to normal dosing schedule after that. See you in 1 week.  Current Anticoagulation Instructions: INR 1.6  Take 1 extra tablet today (Thursday). Then change dose to 1 tablet everyday. Re-check INR in 10 days.

## 2010-05-05 NOTE — Medication Information (Signed)
Summary: rov/cb  Anticoagulant Therapy  Managed by: Weston Brass, PharmD Referring MD: Jens Som MD, Beryle Lathe MD: Jens Som MD, Arlys John Indication 1: Atrial Fibrillation Lab Used: [facility] [place] Site: [address] INR POC 1.7 INR RANGE 2.0-3.0  Dietary changes: no    Health status changes: no    Bleeding/hemorrhagic complications: no    Recent/future hospitalizations: no    Any changes in medication regimen? no    Recent/future dental: no  Any missed doses?: no       Is patient compliant with meds? yes       Allergies: No Known Drug Allergies  Anticoagulation Management History:      The patient is taking warfarin and comes in today for a routine follow up visit.  Positive risk factors for bleeding include an age of 71 years or older.  The bleeding index is 'intermediate risk'.  Positive CHADS2 values include History of HTN.  Negative CHADS2 values include Age > 51 years old.  Anticoagulation responsible provider: Jens Som MD, Arlys John.  INR POC: 1.7.  Cuvette Lot#: [id number].  Exp: 01/2011.    Anticoagulation Management Assessment/Plan:      The patient's current anticoagulation dose is Warfarin sodium 5 mg tabs: Use as directed by Anticoagulation Clinic.  The target INR is 2.0-3.0.  The next INR is due 11/17/2009.  Anticoagulation instructions were given to patient.  Results were reviewed/authorized by Weston Brass, PharmD.  He was notified by Weston Brass PharmD.         Prior Anticoagulation Instructions: INR 2.5. Take 1 tablet daily except 0.5 tablet on Wednesdays. Recheck in 4 weeks.  Current Anticoagulation Instructions: INR 1.7  Take extra 1/2 tablet today then resume same dose of 1 tablet every day except 1/2 tablet on Wednesday.

## 2010-05-05 NOTE — Letter (Signed)
Summary: Implantable Device Instructions  Architectural technologist, Main Office  [address]   [place], [place] [address]   Phone: [phone]  Fax: [phone]      Implantable Device Instructions  You are scheduled for:  _____ Permanent Transvenous Pacemaker ___x__ Implantable Cardioverter Defibrillator (CRT-D) _____ Implantable Loop Recorder _____ Generator Change  on March 05, 2010 at 12:45 pm with Dr. Graciela Husbands.  1.  Please arrive at the Short Stay Center at [facility] at 10:45 am on the day of your procedure.  2.  You may have a clear liquid breakfast before 6:30 am the morning of your procedure.  Clear liquids include: broth, water, Ginger Ale, Sprite, black coffee, tea (no sugar), cranberry/grape/apple juice, jello, and posicle from clear juices. Nothing to eat or drink after 6:30 am.  Take medications, except those listed below, with your liquid breakfast.   3.  Please call the office to schedule a convient time for lab work within seven days prior to your procedure.  The lab at [facility] is open from 8:30 AM to 1:30 PM and from 2:30 PM to 5:00 PM.  The lab at [facility] is open from 7:30 AM to 5:30 PM.  You do not have to be fasting.  4.  Do NOT take these medications the morning of your procedure:  Lisinopril-Hydrochlorothiazide, Klor-Con, and Furosemide.   5. After your procedure, you will go to a Telemetry room to be monitored while taking the medication Tikosyn. Please plan to stay at least 3 days.   6.  Wash your chest and neck with antibacterial soap (any brand) the evening before and the morning of your procedure.  Rinse well.    *If you have ANY questions after you get home, please call the office [phone]. Claris Gladden, RN  *Every attempt is made to prevent procedures from being rescheduled.  Due to the nauture of Electrophysiology, rescheduling can happen.  The physician is always aware and directs the staff  when this occurs.

## 2010-05-05 NOTE — Medication Information (Signed)
Summary: rov/mw  Anticoagulant Therapy  Managed by: Weston Brass, PharmD Referring MD: Jens Som MD, Beryle Lathe MD: Eden Emms MD, Theron Arista Indication 1: Atrial Fibrillation Lab Used: [facility] [place] Site: [address] INR POC 2.0 INR RANGE 2.0-3.0  Dietary changes: no    Health status changes: no    Bleeding/hemorrhagic complications: no    Recent/future hospitalizations: yes       Details: discharged from hospital yesterday after device implant and Tikosyn initiation   Any changes in medication regimen? yes       Details: started Tikosyn last weekend  Recent/future dental: no  Any missed doses?: no       Is patient compliant with meds? yes       Allergies: No Known Drug Allergies  Anticoagulation Management History:      The patient is taking warfarin and comes in today for a routine follow up visit.  Positive risk factors for bleeding include an age of 29 years or older.  The bleeding index is 'intermediate risk'.  Positive CHADS2 values include History of CHF and History of HTN.  Negative CHADS2 values include Age > 32 years old.  His last INR was 2.7 ratio.  Anticoagulation responsible provider: Eden Emms MD, Theron Arista.  INR POC: 2.0.  Cuvette Lot#: [id number].  Exp: 03/2011.    Anticoagulation Management Assessment/Plan:      The patient's current anticoagulation dose is Warfarin sodium 5 mg tabs: Use as directed by Anticoagulation Clinic.  The target INR is 2.0-3.0.  The next INR is due 04/08/2010.  Anticoagulation instructions were given to patient.  Results were reviewed/authorized by Weston Brass, PharmD.  He was notified by Weston Brass PharmD.         Prior Anticoagulation Instructions: INR 2.3  Continue taking 1 tablet everyday. Recheck in 4 weeks.   Current Anticoagulation Instructions: INR 2.0  Take an extra 1/2 tablet today then resume same dose of 1 tablet every day.  Recheck INR in 4 weeks.

## 2010-05-05 NOTE — Medication Information (Signed)
Summary: rov/sl  Anticoagulant Therapy  Managed by: Lyna Poser, PharmD Referring MD: Jens Som MD, Beryle Lathe MD: Shirlee Latch MD,Saqib Cazarez Indication 1: Atrial Fibrillation Lab Used: [facility] [place] Site: [address] INR POC 1.6 INR RANGE 2.0-3.0  Dietary changes: no    Health status changes: no    Bleeding/hemorrhagic complications: no    Recent/future hospitalizations: no    Any changes in medication regimen? yes       Details: started lasix 20 mg daily  Recent/future dental: no  Any missed doses?: no       Is patient compliant with meds? yes       Allergies: No Known Drug Allergies  Anticoagulation Management History:      The patient is taking warfarin and comes in today for a routine follow up visit.  Positive risk factors for bleeding include an age of 71 years or older.  The bleeding index is 'intermediate risk'.  Positive CHADS2 values include History of HTN.  Negative CHADS2 values include Age > 43 years old.  Anticoagulation responsible provider: Shirlee Latch MD,Eilam Shrewsbury.  INR POC: 1.6.  Cuvette Lot#: [id number].  Exp: 01/2011.    Anticoagulation Management Assessment/Plan:      The patient's current anticoagulation dose is Warfarin sodium 5 mg tabs: Use as directed by Anticoagulation Clinic.  The target INR is 2.0-3.0.  The next INR is due 01/02/2010.  Anticoagulation instructions were given to patient.  Results were reviewed/authorized by Lyna Poser, PharmD.         Prior Anticoagulation Instructions: INR 2.1  Continue taking Coumadin 1 tab (5 mg) on all days except Coumadin 0.5 tab (2.5 mg) on Wednesdays. Return to clinic in 4 weeks.  Current Anticoagulation Instructions: INR 1.6 Today take an extra tablet in addition to the half you've already taken. Go back to normal dosing schedule after that. See you in 1 week.

## 2010-05-05 NOTE — Progress Notes (Signed)
Summary: [facility]  [facility]   Imported By: Debby Freiberg 09/26/2009 16:57:23  _____________________________________________________________________  External Attachment:    Type:   Image     Comment:   External Document

## 2010-05-05 NOTE — Progress Notes (Signed)
  Phone Note Outgoing Call   Call placed by: Deliah Goody, RN,  October 10, 2009 10:48 AM Summary of Call: monitor reviewed by dr Jens Som shows atrial flutter with controlled rate. pt aware Deliah Goody, RN  October 10, 2009 10:49 AM

## 2010-05-05 NOTE — Medication Information (Signed)
Summary: rov/tm  Anticoagulant Therapy  Managed by: Lyna Poser, PharmD Referring MD: Jens Som MD, Beryle Lathe MD: Kristeen Miss MD Indication 1: Atrial Fibrillation Lab Used: [facility] [place] Site: [address] INR POC 2.3 INR RANGE 2.0-3.0  Dietary changes: no    Health status changes: no    Bleeding/hemorrhagic complications: no    Recent/future hospitalizations: yes       Details: defribrillator installed december 1st  Any changes in medication regimen? no    Recent/future dental: no  Any missed doses?: no       Is patient compliant with meds? yes      Comments: Patient has been taking 5 mg everyday instead of 7.5 on wednesdays and 5 mg all other days. Says he's been doing that the past 2 times he has seen [place] even though that's not what we told him.   Allergies: No Known Drug Allergies  Anticoagulation Management History:      The patient is taking warfarin and comes in today for a routine follow up visit.  Positive risk factors for bleeding include an age of 71 years or older.  The bleeding index is 'intermediate risk'.  Positive CHADS2 values include History of CHF and History of HTN.  Negative CHADS2 values include Age > 28 years old.  Anticoagulation responsible provider: Kristeen Miss MD.  INR POC: 2.3.  Cuvette Lot#: [id number].  Exp: 03/2011.    Anticoagulation Management Assessment/Plan:      The patient's current anticoagulation dose is Warfarin sodium 5 mg tabs: Use as directed by Anticoagulation Clinic.  The target INR is 2.0-3.0.  The next INR is due 03/09/2010.  Anticoagulation instructions were given to patient.  Results were reviewed/authorized by Lyna Poser, PharmD.         Prior Anticoagulation Instructions: INR 2.7 Continue 5mg s daily except 7.5mg s on Wednesdays. REcheck in 2 weeks.   Current Anticoagulation Instructions: INR 2.3  Continue taking 1 tablet everyday. Recheck in 4 weeks.

## 2010-05-05 NOTE — Miscellaneous (Signed)
  Clinical Lists Changes  Medications: Added new medication of FUROSEMIDE 20 MG TABS (FUROSEMIDE) Take one tablet by mouth daily. - Signed Rx of FUROSEMIDE 20 MG TABS (FUROSEMIDE) Take one tablet by mouth daily.;  #30 x 12;  Signed;  Entered by: Deliah Goody, RN;  Authorized by: Ferman Hamming, MD, [facility];  Method used: Electronically to [facility] [address] [place], [place], [place]  [address], Ph: [phone] or [phone], Fax: [phone]    Prescriptions: FUROSEMIDE 20 MG TABS (FUROSEMIDE) Take one tablet by mouth daily.  #30 x 12   Entered by:   Deliah Goody, RN   Authorized by:   Ferman Hamming, MD, [facility]   Signed by:   Deliah Goody, RN on 12/15/2009   Method used:   Electronically to        [facility] [address] (retail)       [address]       [place], [place]  [address]       Ph: [phone] or [phone]       Fax: [phone]   RxID:   [id number]

## 2010-05-05 NOTE — Assessment & Plan Note (Signed)
Summary: [facility]   Visit Type:  Follow-up  CC:  No complains.  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 Note a cardiac catheterization in April of 2009 prior to his procedure showed an ejection fraction of 20%, no coronary disease, a dilated aortic root, moderate aortic stenosis and mild mitral regurgitation. The patient subsequently had aortic valve replacement with 25 mm Carpentier-Edwards  valve, replacement of ascending aortic aneurysm with 38-mm Hemashield graft, modified Cox maze III. Note he did develop atrial fibrillation 6 months after his procedure. A cardioversion was attempted by his report but he only held sinus for one week. He has been in permanent atrial fibrillation for the past 10-12 months.  Last echocardiogram in [place] during an admission for cholecystitis was performed in September of 2010. Ejection fraction was 30-35%, moderate mitral regurgitation and mild aortic insufficiency. There was mild aortic stenosis by report. I last saw him in June 2011.  Since then the patient denies any dyspnea on exertion, orthopnea, PND, pedal edema, palpitations, syncope, chest pain or bleeding. Note he has had several "dizzy spells". They are sudden in onset. He feels this for approximately 1 minute and they resolve spontaneously. There are no associated symptoms. He has not had syncope.  Current Medications (verified): 1)  Lisinopril-Hydrochlorothiazide 20-12.5 Mg Tabs (Lisinopril-Hydrochlorothiazide) .... Take 1 Tablet By Mouth Once A Day 2)  Digoxin 0.125 Mg Tabs (Digoxin) .... Take One Tablet By Mouth Once Daily 3)  Warfarin Sodium 5 Mg Tabs (Warfarin Sodium) .... Use As Directed By Anticoagulation Clinic 4)  Klor-Con M20 20 Meq Cr-Tabs (Potassium Chloride Crys Cr) .... Take 1/2 Tablet Daily. Ran Out 2  Weeks Ago 5)  Carvedilol 12.5 Mg Tabs (Carvedilol) .... 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  Median sternotomy extracorporeal circulation, aortic valve  replacement with 25 mm Carpentier-Edwards Perimount valve, placement of  ascending aortic aneurysm with 38-mm Hemashield graft, modified Cox maze   III atrial ablation.  appendectomy and Tonsillectomy as a child.  Status post C7 disk removal and fusion in May 2001.   Cholecystectomy  Social History: Reviewed history from 05/07/2009 and no changes required. 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  He has two children and one grandchild.  Tobacco Use - Former.  Alcohol Use - no  Review of Systems       no fevers or chills, productive cough, hemoptysis, dysphasia, odynophagia, melena, hematochezia, dysuria, hematuria, rash, seizure activity, orthopnea, PND, pedal edema, claudication. Remaining systems are negative.   Vital Signs:  Patient profile:   70 year old male Height:      70 inches Weight:      221.50 pounds BMI:     31.90 Pulse rate:   72 / minute Pulse rhythm:   irregular Resp:     18 per minute BP sitting:   128 / 78  (right arm) Cuff size:   large  Vitals Entered By: Vikki Ports (November 26, 2009 10:14 AM)  Physical  Exam  General:  Well-developed well-nourished in no acute distress.  Skin is warm and dry.  HEENT is normal.  Neck is supple. No thyromegaly.  Chest is clear to auscultation with normal expansion.    Cardiovascular exam is irregular; 2/6 systolic murmur no diastolic murmur Abdominal exam nontender or distended. No masses palpated. Extremities show no edema. 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 Plan repeat echocardiogram. If ejection fraction less than or equal to 35% plan referral for ICD. The following medications were removed from the medication list:    Digoxin 0.125 Mg Tabs (Digoxin) .Mar[place] Kitchen... Take one tablet by mouth once daily His updated medication list for this problem includes:    Lisinopril-hydrochlorothiazide 20-12.5 Mg Tabs (Lisinopril-hydrochlorothiazide) .Mar[place] Kitchen... Take 1 tablet by mouth once a day    Warfarin Sodium 5 Mg Tabs (Warfarin sodium) ..... Use as directed by anticoagulation clinic    Carvedilol 12.5 Mg Tabs (Carvedilol) .Mar[place] Kitchen... Take one tablet by mouth twice a day  Problem # 2:  COUMADIN THERAPY (ICD-V58.61) Monitored in the Coumadin clinic. Goal INR 2-3.  Problem # 3:  ESSENTIAL HYPERTENSION, BENIGN (ICD-401.1) Blood pressure controlled on present medications. Will continue. His updated medication list for this problem includes:    Lisinopril-hydrochlorothiazide 20-12.5 Mg Tabs (Lisinopril-hydrochlorothiazide) .Mar[place] Kitchen... Take 1 tablet by mouth once a day    Carvedilol 12.5 Mg Tabs (Carvedilol) .Mar[place] Kitchen... Take one tablet by mouth twice a day  Problem # 4:  AORTIC VALVE REPLACEMENT, HX OF (ICD-V43.3) Followup echocardiogram. Continue SBE prophylaxis. Orders: Echocardiogram (Echo)  Problem # 5:  LEFT BUNDLE BRANCH BLOCK (ICD-426.3)  His updated medication list for this problem includes:    Lisinopril-hydrochlorothiazide 20-12.5 Mg Tabs (Lisinopril-hydrochlorothiazide) .Mar[place] Kitchen... Take 1 tablet by mouth once a day    Warfarin Sodium 5 Mg Tabs (Warfarin sodium) ..... Use as directed by anticoagulation clinic    Carvedilol 12.5 Mg Tabs (Carvedilol) .Mar[place] Kitchen... Take one  tablet by mouth twice a day  Problem # 6:  FIBRILLATION, ATRIAL (ICD-427.31) Patient now in atrial flutter. He is complaining of "dizzy spells". I am concerned about the possibility of pauses. Continue carvedilol but discontinue digoxin. If symptoms persist will need an event monitor. Follow heart rate. Continue Coumadin. The following medications were removed from the medication list:    Digoxin 0.125 Mg Tabs (Digoxin) .Mar[place] Kitchen... Take one tablet by mouth once daily His updated medication list for this problem includes:    Warfarin Sodium 5 Mg Tabs (Warfarin sodium) ..... Use as directed by anticoagulation clinic    Carvedilol 12.5 Mg Tabs (Carvedilol) .Mar[place] Kitchen... Take one tablet by mouth twice a day  Problem # 7:  ASCENDING AORTIC ANEURYSM (ICD-441.2) Plan followup CTA in the future status post aneurysm repair.  Problem # 8:  SLEEP APNEA, OBSTRUCTIVE (ICD-327.23)  Patient Instructions: 1)  Your physician recommends that you schedule a follow-up appointment in: 3 MONTHS 2)  Your physician has recommended you make the following change in your medication: STOP DIGOXIN 3)  Your physician has requested that you have an echocardiogram.  Echocardiography is a painless test that uses sound waves to create images of your heart. It provides your doctor with information about the size and shape of your heart and how well your heart's chambers and valves are working.  This procedure takes approximately one hour. There are no restrictions for this procedure. 4)  MONITOR HEART RATE Prescriptions: KLOR-CON M20 20 MEQ CR-TABS (POTASSIUM CHLORIDE CRYS  CR) Take 1/2 tablet daily. Ran out 2 weeks ago  #30 x 12   Entered by:   Deliah Goody, RN   Authorized by:   Ferman Hamming, MD, [facility]   Signed by:   Deliah Goody, RN on 11/26/2009   Method used:   Electronically to        [facility] [address] (retail)       [address]       [place], [place]  [address]       Ph: [phone] or  [phone]       Fax: [phone]   RxID:   [id number]

## 2010-05-05 NOTE — Procedures (Signed)
Summary: summary report  summary report   Imported By: Mirna Mires 02/02/2010 16:00:30  _____________________________________________________________________  External Attachment:    Type:   Image     Comment:   External Document

## 2010-05-05 NOTE — Miscellaneous (Signed)
  Clinical Lists Changes  Medications: Changed medication from DIGOXIN 0.125 MG TABS (DIGOXIN) Take a half  tablet by mouth daily to DIGOXIN 0.125 MG TABS (DIGOXIN) Take one tablet by mouth once daily - Signed Rx of DIGOXIN 0.125 MG TABS (DIGOXIN) Take one tablet by mouth once daily;  #90 x 3;  Signed;  Entered by: Deliah Goody, RN;  Authorized by: Ferman Hamming, MD, [facility];  Method used: Electronically to [facility] [address] [place], [place], [place]  [address], Ph: [phone] or [phone], Fax: [phone] Rx of LISINOPRIL-HYDROCHLOROTHIAZIDE 20-12.5 MG TABS (LISINOPRIL-HYDROCHLOROTHIAZIDE) Take 1 tablet by mouth once a day;  #90 x 3;  Signed;  Entered by: Deliah Goody, RN;  Authorized by: Ferman Hamming, MD, [facility];  Method used: Electronically to [facility] [address], [place], [place], [place]  [address], Ph: [phone] or [phone], Fax: [phone] Rx of WARFARIN SODIUM 5 MG TABS (WARFARIN SODIUM) Use as directed by Anticoagulation Clinic;  #45 x 3;  Signed;  Entered by: Deliah Goody, RN;  Authorized by: Ferman Hamming, MD, [facility];  Method used: Electronically to [facility] [address] [place], [place], [place]  [address], Ph: [phone] or [phone], Fax: [phone]    Prescriptions: WARFARIN SODIUM 5 MG TABS (WARFARIN SODIUM) Use as directed by Anticoagulation Clinic  #45 x 3   Entered by:   Deliah Goody, RN   Authorized by:   Ferman Hamming, MD, [facility]   Signed by:   Deliah Goody, RN on 09/17/2009   Method used:   Electronically to        [facility] [address] (retail)       [address]       [place], [place]  [address]       Ph: [phone] or [phone]       Fax: [phone]   RxID:   [id number] LISINOPRIL-HYDROCHLOROTHIAZIDE 20-12.5 MG TABS (LISINOPRIL-HYDROCHLOROTHIAZIDE) Take 1 tablet by mouth once a day  #90 x 3  Entered by:   Deliah Goody, RN   Authorized by:   Ferman Hamming, MD, [facility]   Signed by:   Deliah Goody, RN on 09/17/2009   Method used:   Electronically to        [facility] [address] (retail)       [address]       [place], [place]  [address]       Ph: [phone] or [phone]       Fax: [phone]   RxID:   [id number] DIGOXIN 0.125 MG TABS (DIGOXIN) Take one tablet by mouth once daily  #90 x 3   Entered by:   Deliah Goody, RN   Authorized by:   Ferman Hamming, MD, [facility]   Signed by:   Deliah Goody, RN on 09/17/2009   Method used:   Electronically to        [facility] [address] (retail)       [address]       [place], [place]  [address]       Ph: [phone] or [phone]       Fax: [phone]   RxID:   [id number]

## 2010-05-05 NOTE — Assessment & Plan Note (Signed)
Summary: [facility]   Visit Type:  Initial Consult  CC:  HTN-Aortic valve disorder.  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 Note a cardiac catheterization in April of 2009 prior to his procedure showed an ejection fraction of 20%, no coronary disease, a dilated aortic root, moderate aortic stenosis and mild mitral regurgitation. The patient subsequently had aortic valve replacement with 25 mm Carpentier-Edwards Perimount valve, placement of ascending aortic aneurysm with 38-mm Hemashield graft, modified Cox maze III atrial ablation. Note he did develop atrial fibrillation 6 months after his procedure. A cardioversion was attempted by his report but he only held sinus for one week. He has been in permanent atrial fibrillation for the past 10-12 months. Patient previously followed by [facility] heart and vascular but would prefer to be followed here. Last echocardiogram in [place] during an admission for cholecystitis was performed in September of 2010. Ejection fraction was 30-35%, moderate mitral regurgitation and mild aortic insufficiency. There was mild aortic stenosis by report. The patient denies any dyspnea on exertion, orthopnea, PND, pedal edema, palpitations, syncope, chest pain or bleeding.  Preventive Screening-Counseling & Management  Alcohol-Tobacco     Smoking Status: quit  Current Medications (verified): 1)  Diltiazem Hcl Er Beads 240 Mg Xr24h-Cap (Diltiazem Hcl Er Beads) .... Take One Capsule By Mouth Daily 2)  Amiodarone Hcl 200 Mg Tabs (Amiodarone Hcl) .... Take One Tablet By Mouth Daily 3)  Lisinopril-Hydrochlorothiazide 20-12.5 Mg Tabs (Lisinopril-Hydrochlorothiazide) .... Take 1 Tablet By Mouth Once A Day 4)  Digoxin 0.125 Mg Tabs (Digoxin) .... Take A Half  Tablet By Mouth Daily 5)  Warfarin  Sodium 2.5 Mg Tabs (Warfarin Sodium) .... Use As Directed By Anticoagualtion Clinic 6)  Klor-Con M20 20 Meq Cr-Tabs (Potassium Chloride Crys Cr) .... 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   Past Surgical History:  Median sternotomy extracorporeal circulation, aortic valve  replacement with 25 mm Carpentier-Edwards Perimount valve, placement of  ascending aortic aneurysm with 38-mm Hemashield graft, modified Cox maze   III atrial ablation.   repair of an ascending aortic aneurysm  Cox maze  procedure a year ago   Pleural effusion status post left-sided thoracentesis with extraction of 1.2 L of bloody fluid.   appendectomy and Tonsillectomy as a child.  Status post C7 disk removal and fusion in May 2001.   Cholecystectomy  Family History: Reviewed history from 05/06/2009 and no changes required.  Unremarkable for coronary disease.  Social History: Reviewed history from 05/06/2009 and no changes required. 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  He has two children and one grandchild.  Tobacco Use - Former.  Alcohol Use - no Smoking Status:  quit  Review of Systems       no fevers or chills, productive cough, hemoptysis, dysphasia, odynophagia, melena, hematochezia, dysuria, hematuria, rash, seizure activity, orthopnea, PND, pedal edema, claudication. Remaining systems are negative.   Vital Signs:  Patient  profile:   70 year  old male Height:      70 inches Weight:      216.25 pounds BMI:     31.14 Pulse rate:   71 / minute Pulse rhythm:   irregular Resp:     18 per minute BP sitting:   150 / 100  (left arm) Cuff size:   large  Vitals Entered By: Vikki Ports (May 07, 2009 10:38 AM)  Physical Exam  General:  Well developed/well nourished in NAD Skin warm/dry Patient not depressed No peripheral clubbing Back-normal HEENT-normal/normal eyelids Neck supple/normal carotid upstroke bilaterally; no bruits; no JVD; no thyromegaly chest - CTA/ normal expansion; status post sternotomy CV - RRR/normal S1 and S2; 2/6 systolic murmur left sternal border. No diastolic murmur. no rubs or gallops; PMI nondisplaced Abdomen -NT/ND, no HSM, no mass, + bowel sounds, no bruit 2+ femoral pulses, no bruits Ext-no edema, chords, 2+ DP Neuro-grossly nonfocal     Impression & Recommendations:  Problem # 1:  AORTIC VALVE REPLACEMENT, HX OF (ICD-V43.3) Continued SBE prophylaxis.  Problem # 2:  COUMADIN THERAPY (ICD-V58.61) Referred to the Coumadin clinic. Goal INR 2-3.  Problem # 3:  ESSENTIAL HYPERTENSION, BENIGN (ICD-401.1) Blood pressure elevated today. However he states he typically runs 140/80. We will follow this and adjust as indicated. His updated medication list for this problem includes:    Diltiazem Hcl Er Beads 240 Mg Xr24h-cap (Diltiazem hcl er beads) .Marland Kitchen... Take one capsule by mouth daily    Lisinopril-hydrochlorothiazide 20-12.5 Mg Tabs (Lisinopril-hydrochlorothiazide) .Marland Kitchen... 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 We are discontinuing his amiodarone for his atrial fibrillation as described below. As it washes out of his system we will add a beta blocker both for rate control and for his cardiomyopathy. I will titrate this and hopefully wean off his calcium blocker. Once fully titrated we will repeat an echocardiogram his  ejection fraction is less than 35% he would be a candidate for ICD. His updated medication list for this problem includes:    Diltiazem Hcl Er Beads 240 Mg Xr24h-cap (Diltiazem hcl er beads) .Marland Kitchen... Take one capsule by mouth daily    Lisinopril-hydrochlorothiazide 20-12.5 Mg Tabs (Lisinopril-hydrochlorothiazide) .Marland Kitchen... Take 1 tablet by mouth once a day    Digoxin 0.125 Mg Tabs (Digoxin) .Marland Kitchen... Take a half  tablet by mouth daily    Warfarin Sodium 2.5 Mg Tabs (Warfarin sodium) ..... Use as directed by anticoagualtion clinic  Problem # 5:  FIBRILLATION, ATRIAL (ICD-427.31) We had long discussions today concerning the treatment for his atrial fibrillation. I discussed rate control anticoagulation versus reattempt at cardioversion. He has been in a atrial fibrillation for almost one year despite a previous Cox-Maze procedure. He would prefer to not have reattempt at cardioversion. I will therefore discontinue his amiodarone. I will see him back in 8 weeks. As the amiodarone washes out of his system his heart rate will most likely increase and we will add a beta blocker at that time. I would also like to wean his Cardizem in the future if possible. Continue Coumadin with goal INR 2-3. Note I have asked him to contact us if he becomes tachycardic or has increasing shortness of breath which he has had with atrial fibrillation with a rapid ventricular response in the past. His updated medication list for this problem includes:    Digoxin 0.125 Mg Tabs (Digoxin) .Marland Kitchen... Take a half  tablet by  mouth daily    Warfarin Sodium 2.5 Mg Tabs (Warfarin sodium) ..... Use as directed by anticoagualtion clinic  Orders: [facility]. Coumadin Clinic Referral (Coumadin clinic)  Problem # 6:  ASCENDING AORTIC ANEURYSM (ICD-441.2) He will most likely need followup CTA or MRA in the future.  Problem # 7:  SLEEP APNEA, OBSTRUCTIVE (ICD-327.23)  Patient Instructions: 1)  Your physician recommends that you schedule a follow-up  appointment in: 8 WEEKS 2)  Your physician has recommended you make the following change in your medication: STOP AMIODARONE 3)  You have been referred to [facility] [place] [facility]  Appended Document: [facility] electrocardiogram today shows atrial fibrillation at a rate of 69. Left bundle branch block.

## 2010-05-05 NOTE — Medication Information (Signed)
Summary: rov/tm  Anticoagulant Therapy  Managed by: Shelby Dubin, PharmD, BCPS, CPP Referring MD: Jens Som MD, Beryle Lathe MD: Gala Romney MD, Reuel Boom Indication 1: Atrial Fibrillation Lab Used: [facility] [place] Site: [address] INR POC 2.2 INR RANGE 2.0-3.0  Dietary changes: no    Health status changes: no    Bleeding/hemorrhagic complications: no    Recent/future hospitalizations: no    Any changes in medication regimen? no    Recent/future dental: no  Any missed doses?: no       Is patient compliant with meds? yes       Current Medications (verified): 1)  Diltiazem Hcl Er Beads 240 Mg Xr24h-Cap (Diltiazem Hcl Er Beads) .... Take One Capsule By Mouth Daily 2)  Lisinopril-Hydrochlorothiazide 20-12.5 Mg Tabs (Lisinopril-Hydrochlorothiazide) .... Take 1 Tablet By Mouth Once A Day 3)  Digoxin 0.125 Mg Tabs (Digoxin) .... Take A Half  Tablet By Mouth Daily 4)  Warfarin Sodium 5 Mg Tabs (Warfarin Sodium) .... Use As Directed By Anticoagulation Clinic 5)  Klor-Con M20 20 Meq Cr-Tabs (Potassium Chloride Crys Cr) .... Take 1 Tablet By Mouth Once A Day  Allergies (verified): No Known Drug Allergies  Anticoagulation Management History:      The patient is taking warfarin and comes in today for a routine follow up visit.  Positive risk factors for bleeding include an age of 71 years or older.  The bleeding index is 'intermediate risk'.  Positive CHADS2 values include History of HTN.  Negative CHADS2 values include Age > 71 years old old.  Anticoagulation responsible provider: Mackenzye Mackel MD, Reuel Boom.  INR POC: 2.2.  Cuvette Lot#: [id number].  Exp: 07/2010.    Anticoagulation Management Assessment/Plan:      The patient's current anticoagulation dose is Warfarin sodium 5 mg tabs: Use as directed by Anticoagulation Clinic.  The next INR is due 06/26/2009.  Anticoagulation instructions were given to patient.  Results were reviewed/authorized by Shelby Dubin, PharmD, BCPS,  CPP.  He was notified by Shelby Dubin PharmD, BCPS, CPP.         Prior Anticoagulation Instructions: INR 1.9 Today take extra 2.5mg s then change dose to 5mg s everyday except 2.5mg s on Wednesdays. Recheck in 2-3  weeks per Dr. Jimmey Ralph.   Current Anticoagulation Instructions: INR 2.2  Continue 1 tab daily except 0.5 tab each Wednesday.  Recheck in 4 weeks.

## 2010-05-05 NOTE — Medication Information (Signed)
Summary: CCN/ATRIAL FIB/DM  Anticoagulant Therapy  Managed by: Bethena Midget, RN, BSN Referring MD: Jens Som MD, Beryle Lathe MD: Tenny Craw MD, Gunnar Fusi Indication 1: Atrial Fibrillation Lab Used: [facility]  Site: [address] INR POC 1.9 INR RANGE 2.0-3.0  Dietary changes: no    Health status changes: no    Bleeding/hemorrhagic complications: no    Recent/future hospitalizations: no    Any changes in medication regimen? yes       Details: Amiodarone discontinued yesterday( last dose)   Recent/future dental: no  Any missed doses?: no       Is patient compliant with meds? yes      Comments: May 2009 had Aortic Valvle Replacement, Maze and also fixed Aortic Aneursym  by Dr. Dorris Fetch. Been on coumadin off and on pt. states for 2 yrs. But HR recently went out of rhythm now back on coumadin.   Current Medications (verified): 1)  Diltiazem Hcl Er Beads 240 Mg Xr24h-Cap (Diltiazem Hcl Er Beads) .... Take One Capsule By Mouth Daily 2)  Lisinopril-Hydrochlorothiazide 20-12.5 Mg Tabs (Lisinopril-Hydrochlorothiazide) .... Take 1 Tablet By Mouth Once A Day 3)  Digoxin 0.125 Mg Tabs (Digoxin) .... Take A Half  Tablet By Mouth Daily 4)  Warfarin Sodium 5 Mg Tabs (Warfarin Sodium) .... Use As Directed By Anticoagulation Clinic 5)  Klor-Con M20 20 Meq Cr-Tabs (Potassium Chloride Crys Cr) .... Take 1 Tablet By Mouth Once A Day  Allergies (verified): No Known Drug Allergies  Anticoagulation Management History:      The patient comes in today for his initial visit for anticoagulation therapy.  Positive risk factors for bleeding include an age of 71 years or older.  The bleeding index is 'intermediate risk'.  Positive CHADS2 values include History of HTN.  Negative CHADS2 values include Age > 71 years old.  Anticoagulation responsible provider: Tenny Craw MD, Gunnar Fusi.  INR POC: 1.9.  Cuvette Lot#: [id number].  Exp: 07/2010.    Anticoagulation Management Assessment/Plan:      The  patient's current anticoagulation dose is Warfarin sodium 5 mg tabs: Use as directed by Anticoagulation Clinic.  The next INR is due 05/29/2009.  Anticoagulation instructions were given to patient.  Results were reviewed/authorized by Bethena Midget, RN, BSN.  He was notified by Bethena Midget, RN, BSN.         Current Anticoagulation Instructions: INR 1.9 Today take extra 2.5mg s then change dose to 5mg s everyday except 2.5mg s on Wednesdays. Recheck in 2-3  weeks per Dr. Jimmey Ralph.

## 2010-05-05 NOTE — Procedures (Signed)
Summary: summary report  summary report   Imported By: Mirna Mires 10/10/2009 13:54:51  _____________________________________________________________________  External Attachment:    Type:   Image     Comment:   External Document

## 2010-05-05 NOTE — Letter (Signed)
Summary: Handout Printed  Printed Handout:  - Coumadin Instructions-w/out Meds 

## 2010-05-05 NOTE — Assessment & Plan Note (Signed)
Summary: ec6/discuss ICD/DM   Visit Type:  ec6 Referring Provider:  Dr. Jens Som  CC:  no complaints.  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  He has minimal SOB with activity;  He has no edema..  He has had episodes of dizziness that lasted about a minute unassociated with epiphenomena or palpitations. It occurred some minutes after standing and in the context of modest exertion.  He does have some palpitations, specifically after his digoxin was held there was rapid heart rates with a change in his basal rate from 70-90 and significant exercise intolerance attended that change.   Cardiac catheterization in April of 2009 prior to his procedure showed an ejection fraction of 20%, no coronary disease, a dilated aortic root, moderate aortic stenosis and mild mitral regurgitation. The patient subsequently had aortic valve replacement with 25 mm Carpentier-Edwards  valve, replacement of ascending aortic aneurysm with 38-mm Hemashield graft, modified Cox maze III.   Note he did develop atrial fibrillation 6 months after his procedure. A cardioversion was attempted by his report but he only held sinus for one week. He has been in permanent atrial fibrillation for the past 10-12 months.  Last echocardiogram in [place] during an admission for cholecystitis was performed in September of 2010. Ejection fraction was 30-35%, moderate mitral regurgitation and mild aortic insufficiency. There was mild aortic stenosis by report. I last saw him in June 2011.  Since then the patient denies any dyspnea on exertion, orthopnea, PND, pedal edema, palpitations, syncope, chest pain or bleeding.    Note he has had several "dizzy spells". They are sudden in onset. He feels this for approximately 1 minute and they resolve spontaneously. There are  no associated symptoms. He has not had syncope.  Problems Prior to Update: 1)  Encounter For Long-term Use of Other Medications  (ICD-V58.69) 2)  Other Primary Cardiomyopathies  (ICD-425.4) 3)  Coumadin Therapy  (ICD-V58.61) 4)  Essential Hypertension, Benign  (ICD-401.1) 5)  Aortic Valve Replacement, Hx of  (ICD-V43.3) 6)  Left Bundle Branch Block  (ICD-426.3) 7)  Fibrillation, Atrial  (ICD-427.31) 8)  Aortic Stenosis  (ICD-424.1) 9)  Ascending Aortic Aneurysm  (ICD-441.2) 10)  Degenerative Joint Disease  (ICD-715.90) 11)  Bicuspid Aortic Valve  (ICD-746.4) 12)  Hypotension  (ICD-458.9) 13)  Shortness of Breath  (ICD-786.05) 14)  Sleep Apnea, Obstructive  (ICD-327.23) 15)  Cholelithiasis  (ICD-574.20)  Current Medications (verified): 1)  Lisinopril-Hydrochlorothiazide 20-12.5 Mg Tabs (Lisinopril-Hydrochlorothiazide) .... Take 1 Tablet By Mouth Once A Day 2)  Warfarin Sodium 5 Mg Tabs (Warfarin Sodium) .... Use As Directed By Anticoagulation Clinic 3)  Klor-Con M20 20 Meq Cr-Tabs (Potassium Chloride Crys Cr) .... Take 1/2 Tablet Daily. Ran Out 2 Weeks Ago 4)  Carvedilol 12.5 Mg Tabs (Carvedilol) .... Take One Tablet By Mouth Twice A Day 5)  Furosemide 20 Mg Tabs (Furosemide) .... As Needed 6)  Digoxin 0.125 Mg Tabs (Digoxin) .... 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  appendectomy and Tonsillectomy as a child.  Status post C7 disk removal and fusion in May 2001.   Cholecystectomy  Family History: Last updated: 05/06/2009  Unremarkable for coronary disease.  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  He has two children and one grandchild.  Tobacco Use - Former.  Alcohol Use - no  Risk Factors: Smoking Status: quit (05/07/2009)  Vital Signs:  Patient profile:   70 year old male Height:      70 inches Weight:      219.50 pounds BMI:     31.61 Pulse rate:   72 / minute BP sitting:   136 / 78  (left arm) Cuff size:   regular  Vitals Entered By: Caralee Ates CMA (January 26, 2010 1:55 PM)  Physical Exam  General:  Well developed, well nourished,older Caucasian male appearing his stated age in no acute distress. Head:  normal HEENT Neck:  supple without thyromegaly Chest Wall:  without kyphosis or scoliosis well-healed sternotomy scar Lungs:  clear Heart:  irregular with a 2/6 systolic murmur heard along the right upper sternal border Abdomen:  soft protuberant with active bowel sounds and without hepatomegaly Msk:  without skeletal deformities Pulses:  intact distal pulses Extremities:  no clubbing cyanosis or edema Neurologic:  alert and oriented and grossly normal sensory motor function Skin:  warm and dry without rashes Cervical Nodes:  note adenopathy Psych:  engaging affect   Impression & Recommendations:  Problem # 1:  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 It is appropriate to consider ICD implantation for primary prevention of sudden cardiac death. Based on theMadit-CRT results, it would be appropriate also to place a left ventricular lead in the hopes of decreasing the development of congestive symptoms. I have  reviewed this with him.  We discussed the benefits as well as the risks of CRT-D implantation he understands these risks and is willing to proceed  Problem # 2:  SYSTOLIC HEART FAILURE, CHRONIC (ICD-428.22) the patient has mild exercise limitations which he could identify following reversion of his sinus rhythm 2 his post operative atrial arrhythmia.  today his electro cardiogram demonstrates flutter. Others have been described as atrial fibrillation.  maintaining anticoagulation is appropriate at the time of the procedure as it is possible that reversion to sinus rhythm would occur. We elected discussion regarding the potential role of amiodarone for maintenance of sinus rhythm.The alternative would be to start him on Tikosyn at the time of his procedure. We decided that that is preferable. His updated medication list for this problem includes:    Lisinopril-hydrochlorothiazide 20-12.5 Mg Tabs (Lisinopril-hydrochlorothiazide) .Marland Kitchen... Take 1 tablet by mouth once a day    Warfarin Sodium 5 Mg Tabs (Warfarin sodium) ..... Use as directed by anticoagulation clinic    Carvedilol 12.5 Mg Tabs (Carvedilol) .Marland Kitchen... Take one tablet by mouth twice a day    Furosemide 20 Mg Tabs (Furosemide) .Marland Kitchen... Take one tablet by mouth daily.    Digoxin 0.125 Mg Tabs (Digoxin) ..... Once daily  Problem # 3:  ATRIAL FIBRILLATION AND FLUTTER (ICD-427.31) as above. His updated medication list for this problem includes:    Warfarin Sodium 5 Mg Tabs (Warfarin sodium) ..... Use as directed by anticoagulation clinic    Carvedilol  12.5 Mg Tabs (Carvedilol) .Marland Kitchen... Take one tablet by mouth twice a day    Digoxin 0.125 Mg Tabs (Digoxin) ..... Once daily  Problem # 4:  LEFT BUNDLE BRANCH BLOCK (ICD-426.3) as above His updated medication list for this problem includes:    Lisinopril-hydrochlorothiazide 20-12.5 Mg Tabs (Lisinopril-hydrochlorothiazide) .Marland Kitchen... Take 1 tablet by mouth once a day    Warfarin Sodium 5 Mg Tabs (Warfarin  sodium) ..... Use as directed by anticoagulation clinic    Carvedilol 12.5 Mg Tabs (Carvedilol) .Marland Kitchen... Take one tablet by mouth twice a day  Patient Instructions: 1)  Bjorn Loser will call you regarding scheduling of appointment for procedure.

## 2010-05-05 NOTE — Letter (Signed)
Summary: [facility] Office Note  [facility] Office Note   Imported By: Roderic Ovens 05/12/2009 15:07:04  _____________________________________________________________________  External Attachment:    Type:   Image     Comment:   External Document

## 2010-05-05 NOTE — Assessment & Plan Note (Signed)
Summary: [name]   Visit Type:  2 months follow up  CC:  No complains.  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 Note a cardiac catheterization in April of 2009 prior to his procedure showed an ejection fraction of 20%, no coronary disease, a dilated aortic root, moderate aortic stenosis and mild mitral regurgitation. The patient subsequently had aortic valve replacement with 25 mm Carpentier-Edwards  valve, replacement of ascending aortic aneurysm with 38-mm Hemashield graft, modified Cox maze III. Note he did develop atrial fibrillation 6 months after his procedure. A cardioversion was attempted by his report but he only held sinus for one week. He has been in permanent atrial fibrillation for the past 10-12 months.  Last echocardiogram in [place] during an admission for cholecystitis was performed in September of 2010. Ejection fraction was 30-35%, moderate mitral regurgitation and mild aortic insufficiency. There was mild aortic stenosis by report. I last saw him in Aprill 2011 and we began decreasing his cardizem and adding coreg for his cardiomyopathy. Since then the patient denies any dyspnea on exertion, orthopnea, PND, pedal edema, palpitations, syncope, chest pain or bleeding.  Current Medications (verified): 1)  Diltiazem Hcl Er Beads 120 Mg Xr24h-Cap (Diltiazem Hcl Er Beads) .... Take One Capsule By Mouth Daily 2)  Lisinopril-Hydrochlorothiazide 20-12.5 Mg Tabs (Lisinopril-Hydrochlorothiazide) .... Take 1 Tablet By Mouth Once A Day 3)  Digoxin 0.125 Mg Tabs (Digoxin) .... Take A Half  Tablet By Mouth Daily 4)  Warfarin Sodium 5 Mg Tabs (Warfarin Sodium) .... Use As Directed By Anticoagulation Clinic 5)  Klor-Con M20 20 Meq Cr-Tabs (Potassium Chloride Crys Cr) .... Take 1/2 Tablet Daily. Ran Out 2 Weeks  Ago 6)  Carvedilol 6.25 Mg Tabs (Carvedilol) .... Take One Tablet By Mouth Twice A Day  Allergies (verified): No Known Drug Allergies  Past History:  Past Medical History: Hypertension LEFT BUNDLE BRANCH BLOCK (ICD-426.3) FIBRILLATION, ATRIAL (ICD-427.31) AORTIC STENOSIS (ICD-424.1) ASCENDING AORTIC ANEURYSM (ICD-441.2) DEGENERATIVE JOINT DISEASE (ICD-715.90) BICUSPID AORTIC VALVE (ICD-746.4) SLEEP APNEA, OBSTRUCTIVE (ICD-327.23) CHOLELITHIASIS (ICD-574.20) NICM  Past Surgical History:  Median sternotomy extracorporeal circulation, aortic valve  replacement with 25 mm Carpentier-Edwards Perimount valve, placement of  ascending aortic aneurysm with 38-mm Hemashield graft, modified Cox maze   III atrial ablation.  appendectomy and Tonsillectomy as a child.  Status post C7 disk removal and fusion in May 2001.   Cholecystectomy  Social History: Reviewed history from 05/07/2009 and no changes required. 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  He has two children and one grandchild.  Tobacco Use - Former.  Alcohol Use - no  Review of Systems       no fevers or chills, productive cough, hemoptysis, dysphasia, odynophagia, melena, hematochezia, dysuria, hematuria, rash, seizure activity, orthopnea, PND, pedal edema, claudication. Remaining systems are negative.   Vital Signs:  Patient profile:   70 year old male Height:      70 inches Weight:      217 pounds BMI:     31.25 Pulse rate:   68 / minute Pulse rhythm:   irregular Resp:     18 per minute BP sitting:   130 / 88  (left arm) Cuff size:   large  Vitals Entered By: Vikki Ports (September 17, 2009 9:16 AM)  Physical Exam  General:  Well-developed well-nourished in no  acute distress.  Skin is warm and dry.  HEENT is normal.  Neck is supple. No thyromegaly.  Chest is clear to auscultation with normal expansion.  Cardiovascular exam is irregular with 2/6 systolic murmur left sternal border. No diastolic  murmur. Abdominal exam nontender or distended. No masses palpated. Extremities show no edema. neuro grossly intact    EKG  Procedure date:  09/17/2009  Findings:      Atrial fibrillation with left bundle branch block.  Impression & Recommendations:  Problem # 1:  OTHER PRIMARY CARDIOMYOPATHIES (ICD-425.4) Plan discontinue Cardizem. Increase Coreg to 12.5 mg p.o. b.i.d. I will see him back in 8 weeks and increase his ACE inhibitor if possible as tolerated by blood pressure. Once medications are titrated plan repeat echocardiogram. If ejection fraction less than or equal to 35% referr to EP for consideration of ICD. The following medications were removed from the medication list:    Diltiazem Hcl Er Beads 120 Mg Xr24h-cap (Diltiazem hcl er beads) .Marland Kitchen... Take one capsule by mouth daily His updated medication list for this problem includes:    Lisinopril-hydrochlorothiazide 20-12.5 Mg Tabs (Lisinopril-hydrochlorothiazide) .Marland Kitchen... Take 1 tablet by mouth once a day    Digoxin 0.125 Mg Tabs (Digoxin) .Marland Kitchen... Take a half  tablet by mouth daily    Warfarin Sodium 5 Mg Tabs (Warfarin sodium) ..... Use as directed by anticoagulation clinic    Carvedilol 12.5 Mg Tabs (Carvedilol) .Marland Kitchen... Take one tablet by mouth twice a day  Problem # 2:  COUMADIN THERAPY (ICD-V58.61) Monitored in the Coumadin clinic. Goal INR 2-3.  Problem # 3:  ESSENTIAL HYPERTENSION, BENIGN (ICD-401.1) Blood pressure controlled on present medications. Will continue. The following medications were removed from the medication list:    Diltiazem Hcl Er Beads 120 Mg Xr24h-cap (Diltiazem hcl er beads) .Marland Kitchen... Take one capsule by mouth daily His updated medication list for this problem includes:    Lisinopril-hydrochlorothiazide 20-12.5 Mg Tabs (Lisinopril-hydrochlorothiazide) .Marland Kitchen... Take 1 tablet by mouth once a day    Carvedilol 12.5 Mg Tabs (Carvedilol) .Marland Kitchen... Take one tablet by mouth twice a day  Problem # 4:  AORTIC VALVE  REPLACEMENT, HX OF (ICD-V43.3) Continued SBE prophylaxis.  Problem # 5:  LEFT BUNDLE BRANCH BLOCK (ICD-426.3)  The following medications were removed from the medication list:    Diltiazem Hcl Er Beads 120 Mg Xr24h-cap (Diltiazem hcl er beads) .Marland Kitchen... Take one capsule by mouth daily His updated medication list for this problem includes:    Lisinopril-hydrochlorothiazide 20-12.5 Mg Tabs (Lisinopril-hydrochlorothiazide) .Marland Kitchen... Take 1 tablet by mouth once a day    Warfarin Sodium 5 Mg Tabs (Warfarin sodium) ..... Use as directed by anticoagulation clinic    Carvedilol 12.5 Mg Tabs (Carvedilol) .Marland Kitchen... Take one tablet by mouth twice a day  Problem # 6:  FIBRILLATION, ATRIAL (ICD-427.31) Given nonischemic cardiomyopathy plan discontinue Cardizem. Increase Coreg to 12.5 mg p.o. b.i.d.  48-hour Holter monitor in one week to make sure that rate is adequately controlled. Continue Coumadin. His updated medication list for this problem includes:    Digoxin 0.125 Mg Tabs (Digoxin) .Marland Kitchen... Take a half  tablet by mouth daily    Warfarin Sodium 5 Mg Tabs (Warfarin sodium) ..... Use as directed by anticoagulation clinic    Carvedilol 12.5 Mg Tabs (Carvedilol) .Marland Kitchen... Take one tablet by mouth twice a day  Orders: Holter (Holter)  Problem # 7:  ASCENDING AORTIC ANEURYSM (ICD-441.2) Will need her CTA or MRA and future. The patient is status post repair.  Problem #  8:  SLEEP APNEA, OBSTRUCTIVE (ICD-327.23)  Patient Instructions: 1)  Your physician recommends that you schedule a follow-up appointment in: 8 WEEKS 2)  Your physician has recommended that you wear a holter monitor.  Holter monitors are medical devices that record the heart's electrical activity. Doctors most often use these monitors to diagnose arrhythmias. Arrhythmias are problems with the speed or rhythm of the heartbeat. The monitor is a small, portable device. You can wear one while you do your normal daily activities. This is usually used to  diagnose what is causing palpitations/syncope (passing out). 3)  Your physician has recommended you make the following change in your medication: STOP DILTIAZEM 4)  INCREASE CARVEDALOL 12.5MG  ONE TABLET TWICE DAILY Prescriptions: CARVEDILOL 12.5 MG TABS (CARVEDILOL) Take one tablet by mouth twice a day  #60 x 12   Entered by:   Deliah Goody, RN   Authorized by:   Ferman Hamming, MD, [facility]   Signed by:   Deliah Goody, RN on 09/17/2009   Method used:   Electronically to        [id number] (retail)       [address]       [place], [place]  [address]       Ph: [phone] or [phone]       Fax: [phone]   RxID:   [id number]

## 2010-05-05 NOTE — Progress Notes (Signed)
Summary: sob x 2 days  Phone Note Call from Patient   Caller: Patient [phone] Reason for Call: Talk to Nurse Summary of Call: pt having a -fib off and on-now having sob x 2 days-getting worse-appt with klein 10-24 to discuss pacemaker-what to do? [phone] Initial call taken by: Glynda Jaeger,  December 15, 2009 3:34 PM  Follow-up for Phone Call        spoke with pt, he is having regular atrial fib and has noticed for the last couple days SOB. he denies swelling in his feet and ankles but his abdomen is larger and his weight is up 10lbs in 2 weeks. he notices the SOB with walking about 100 yards and his heart rate elevates to about 90. he was unable to sleep late night in the bed, he sat in the recliner but denies trouble breathing while lying flat. will foward for dr Jens Som review Deliah Goody, RN  December 15, 2009 4:09 PM   Additional Follow-up for Phone Call Additional follow up Details #1::        Lasix 20 mg by mouth daily; bmet/bnp 1 week 48 hour holter for afib (? rate controlled) Ferman Hamming, MD, [facility]  December 15, 2009 4:29 PM  pt aware of new med and need for labs Deliah Goody, RN  December 15, 2009 4:38 PM

## 2010-05-05 NOTE — Progress Notes (Signed)
Summary: holter monitor  Phone Note Call from Patient Call back at Home Phone [phone]   Call For: monitor Action Taken: Appt Scheduled Summary of Call: Pt would like to have holter monitor done same day  of coumadin and Lab work. Initial call taken by: Marcos Eke,  December 16, 2009 4:38 PM

## 2010-05-05 NOTE — Medication Information (Signed)
Summary: rov/cs  Anticoagulant Therapy  Managed by: Bethena Midget, RN, BSN Referring MD: Jens Som MD, Beryle Lathe MD: Johney Frame MD, Fayrene Fearing Indication 1: Atrial Fibrillation Lab Used: [facility]  Site: [address] INR POC 2.7 INR RANGE 2.0-3.0  Dietary changes: no    Health status changes: no    Bleeding/hemorrhagic complications: no    Recent/future hospitalizations: no    Any changes in medication regimen? no    Recent/future dental: no  Any missed doses?: no       Is patient compliant with meds? yes      Comments: Seeing Dr Graciela Husbands today.   Allergies: No Known Drug Allergies  Anticoagulation Management History:      The patient is taking warfarin and comes in today for a routine follow up visit.  Positive risk factors for bleeding include an age of 71 years or older.  The bleeding index is 'intermediate risk'.  Positive CHADS2 values include History of HTN.  Negative CHADS2 values include Age > 12 years old.  Anticoagulation responsible provider: Roberta Angell MD, Fayrene Fearing.  INR POC: 2.7.  Cuvette Lot#: [id number].  Exp: 03/2011.    Anticoagulation Management Assessment/Plan:      The patient's current anticoagulation dose is Warfarin sodium 5 mg tabs: Use as directed by Anticoagulation Clinic.  The target INR is 2.0-3.0.  The next INR is due 02/09/2010.  Anticoagulation instructions were given to patient.  Results were reviewed/authorized by Bethena Midget, RN, BSN.  He was notified by Bethena Midget, RN, BSN.         Prior Anticoagulation Instructions: INR 1.9  Take Coumadin 1 tablet every day of the week, except 1 and 1/2 tablets on Wednesday.  Return to clinic in 2 weeks.    Current Anticoagulation Instructions: INR 2.7 Continue 5mg s daily except 7.5mg s on Wednesdays. REcheck in 2 weeks.

## 2010-05-05 NOTE — Assessment & Plan Note (Signed)
Summary: [facility]   Visit Type:  3 months follow up Referring Provider:  Dr. Jens Som  CC:  No complaints.  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 Note a cardiac catheterization in April of 2009 prior to his procedure showed an ejection fraction of 20%, no coronary disease, a dilated aortic root, moderate aortic stenosis and mild mitral regurgitation. The patient subsequently had aortic valve replacement with 25 mm Carpentier-Edwards  valve, replacement of ascending aortic aneurysm with 38-mm Hemashield graft, modified Cox maze III. Note he did develop atrial fibrillation 6 months after his procedure. A cardioversion was attempted by his report but he only held sinus for one week. He has been in permanent atrial fibrillation for the past 10-12 months.  Last echocardiogram performed 8/11 and showed Ejection fraction was 35%, moderate LAE, mild mitral regurgitation; prosthetic aortic valve with mean gradient of 10 mmHg. Holtet monitor in Oct 2011 showed atrial flutter with controlled ventricular response. Patient seen by Dr. Graciela Husbands recently and is scheduled for ICD and possible initiation of tikosyn for atrial flutter.  I last saw him in August 2011.  Since then the patient has dyspnea with more extreme activities but not with routine activities. It is relieved with rest. It is not associated with chest pain. There is no orthopnea, PND or pedal edema. There is no syncope or palpitations. There is no exertional chest pain. Denies bleeding; no further "dizzy" spells.   Current Medications (verified): 1)  Lisinopril-Hydrochlorothiazide 20-12.5 Mg Tabs (Lisinopril-Hydrochlorothiazide) .... Take 1 Tablet By Mouth Once A Day 2)  Warfarin Sodium 5 Mg Tabs (Warfarin Sodium) .... Use As Directed By Anticoagulation Clinic 3)   Klor-Con M20 20 Meq Cr-Tabs (Potassium Chloride Crys Cr) .... Take 1/2 Tablet Daily. Ran Out 2 Weeks Ago 4)  Carvedilol 12.5 Mg Tabs (Carvedilol) .... Take One Tablet By Mouth Twice A Day 5)  Furosemide 20 Mg Tabs (Furosemide) .... As Needed 6)  Digoxin 0.125 Mg Tabs (Digoxin) .... Once Daily  Allergies (verified): No Known Drug Allergies  Past History:  Past Medical History: Reviewed history from 11/26/2009 and no changes required. Hypertension LEFT BUNDLE BRANCH BLOCK (ICD-426.3) FIBRILLATION, ATRIAL (ICD-427.31) atrial flutter AORTIC STENOSIS (ICD-424.1) ASCENDING AORTIC ANEURYSM (ICD-441.2) DEGENERATIVE JOINT DISEASE (ICD-715.90) BICUSPID AORTIC VALVE (ICD-746.4) SLEEP APNEA, OBSTRUCTIVE (ICD-327.23) CHOLELITHIASIS (ICD-574.20) NICM  Past Surgical History: Reviewed history from 09/17/2009 and no changes required.  Median sternotomy extracorporeal circulation, aortic valve  replacement with 25 mm Carpentier-Edwards Perimount valve, placement of  ascending aortic aneurysm with 38-mm Hemashield graft, modified Cox maze   III atrial ablation.  appendectomy and Tonsillectomy as a child.  Status post C7 disk removal and fusion in May 2001.   Cholecystectomy  Social History: Reviewed history from 05/07/2009 and no changes required. 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  He has two children and one grandchild.  Tobacco Use - Former.  Alcohol Use - no  Review of Systems       no fevers or chills, productive cough, hemoptysis, dysphasia, odynophagia, melena, hematochezia, dysuria, hematuria, rash, seizure activity, orthopnea, PND, pedal edema, claudication. Remaining systems are negative.   Vital Signs:  Patient profile:   70 year old male Height:      70 inches Weight:      218.25 pounds BMI:     31.43 Pulse rate:  72 / minute Pulse rhythm:   regular Resp:     18 per minute BP sitting:   124 / 84  (left arm) Cuff size:   large  Vitals Entered By: Vikki Ports (February 18, 2010 9:14 AM)  Physical Exam  General:  Well-developed well-nourished in no acute distress.  Skin is warm and dry.  HEENT is normal.  Neck is supple. No thyromegaly.  Chest is clear to auscultation with normal expansion.  Cardiovascular exam is irregular; 2/6 systolic ejection murmur. Abdominal exam nontender or distended. No masses palpated. Extremities show no edema. 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 Euvolemic on examination. Patient is scheduled for ICD in early December with Dr. Graciela Husbands. His updated medication list for this problem includes:    Lisinopril-hydrochlorothiazide 20-12.5 Mg Tabs (Lisinopril-hydrochlorothiazide) .Marland Kitchen... Take 1 tablet by mouth once a day    Warfarin Sodium 5 Mg Tabs (Warfarin sodium) ..... Use as directed by anticoagulation clinic    Carvedilol 12.5 Mg Tabs (Carvedilol) .Marland Kitchen... Take one tablet by mouth twice a day    Furosemide 20 Mg Tabs (Furosemide) .Marland Kitchen... As needed    Digoxin 0.125 Mg Tabs (Digoxin) ..... Once daily  Problem # 2:  COUMADIN THERAPY (ICD-V58.61) Monitor in the Coumadin clinic. Goal INR 2-3.  Problem # 3:  ESSENTIAL HYPERTENSION, BENIGN (ICD-401.1) Blood pressure controlled on present medications. Will continue. His updated medication list for this problem includes:    Lisinopril-hydrochlorothiazide 20-12.5 Mg Tabs (Lisinopril-hydrochlorothiazide) .Marland Kitchen... Take 1 tablet by mouth once a day    Carvedilol 12.5 Mg Tabs (Carvedilol) .Marland Kitchen... Take one tablet by mouth twice a day    Furosemide 20 Mg Tabs (Furosemide) .Marland Kitchen... As needed  Problem # 4:  AORTIC VALVE REPLACEMENT, HX OF (ICD-V43.3) Continued SBE prophylaxis.  Problem # 5:  ATRIAL FIBRILLATION AND FLUTTER (ICD-427.31) Continue beta blocker, digoxin and Coumadin. At the time of his ICD he will most likely be initiated on tikosyn and a possible  attempt to reestablish sinus rhythm. His updated medication list for this problem includes:    Warfarin Sodium 5 Mg Tabs (Warfarin sodium) ..... Use as directed by anticoagulation clinic    Carvedilol 12.5 Mg Tabs (Carvedilol) .Marland Kitchen... Take one tablet by mouth twice a day    Digoxin 0.125 Mg Tabs (Digoxin) ..... Once daily  Problem # 6:  ASCENDING AORTIC ANEURYSM (ICD-441.2) Plan followup CTA in one year.  Problem # 7:  SLEEP APNEA, OBSTRUCTIVE (ICD-327.23)  Patient Instructions: 1)  Your physician wants you to follow-up in:6 MONTHS   You will receive a reminder letter in the mail two months in advance. If you don't receive a letter, please call our office to schedule the follow-up appointment.

## 2010-05-05 NOTE — Miscellaneous (Signed)
Summary: Device preload  Clinical Lists Changes  Observations: Added new observation of ICD INDICATN: ICM (03/13/2010 9:04) Added new observation of ICDLEADSTAT3: active (03/13/2010 9:04) Added new observation of ICDLEADSER3: [id number] (03/13/2010 9:04) Added new observation of ICDLEADMOD3: 4396  (03/13/2010 9:04) Added new observation of ICDLEADLOC3: LV  (03/13/2010 9:04) Added new observation of ICDLEADSTAT2: active  (03/13/2010 9:04) Added new observation of ICDLEADSER2: 409811  (03/13/2010 9:04) Added new observation of ICDLEADMOD2: 0158  (03/13/2010 9:04) Added new observation of ICDLEADLOC2: RV  (03/13/2010 9:04) Added new observation of ICDLEADSTAT1: active  (03/13/2010 9:04) Added new observation of ICDLEADSER1: [id number]  (03/13/2010 9:04) Added new observation of ICDLEADMOD1: 5076  (03/13/2010 9:04) Added new observation of ICDLEADLOC1: RA  (03/13/2010 9:04) Added new observation of ICD IMP MD: Sherryl Manges, MD  (03/13/2010 9:04) Added new observation of ICDLEADDOI3: 03/05/2010  (03/13/2010 9:04) Added new observation of ICDLEADDOI2: 03/05/2010  (03/13/2010 9:04) Added new observation of ICDLEADDOI1: 03/05/2010  (03/13/2010 9:04) Added new observation of ICD IMPL DTE: 03/05/2010  (03/13/2010 9:04) Added new observation of ICD SERL#: [id number]  (03/13/2010 9:04) Added new observation of ICD MODL#: N119  (03/13/2010 9:04) Added new observation of ICDMANUFACTR: [employer]  (03/13/2010 9:04) Added new observation of ICD MD: Sherryl Manges, MD  (03/13/2010 9:04)       ICD Specifications Following MD:  Sherryl Manges, MD     ICD Vendor:  [facility] [employer]     ICD Model Number:  N119     ICD Serial Number:  [id number] ICD DOI:  03/05/2010     ICD Implanting MD:  Sherryl Manges, MD  Lead 1:    Location: RA     DOI: 03/05/2010     Model #: [id number]     Serial #: [id number]     Status: active Lead 2:    Location: RV     DOI: 03/05/2010     Model #: 2725     Serial #: [id number]     Status:  active Lead 3:    Location: LV     DOI: 03/05/2010     Model #: [id number]     Serial #: [id number]     Status: active  Indications::  ICM

## 2010-05-06 ENCOUNTER — Ambulatory Visit: Admit: 2010-05-06 | Payer: Self-pay

## 2010-05-07 NOTE — Medication Information (Signed)
Summary: rov/sp  Anticoagulant Therapy  Managed by: Weston Brass, PharmD Referring MD: Jens Som MD, Beryle Lathe MD: Patty Sermons Indication 1: Atrial Fibrillation Lab Used: [facility] [place] Site: [address] INR POC 2.4 INR RANGE 2.0-3.0  Dietary changes: no    Health status changes: no    Bleeding/hemorrhagic complications: no    Recent/future hospitalizations: no    Any changes in medication regimen? no    Recent/future dental: no  Any missed doses?: no       Is patient compliant with meds? yes       Current Medications (verified): 1)  Lisinopril-Hydrochlorothiazide 20-12.5 Mg Tabs (Lisinopril-Hydrochlorothiazide) .... Take 1 Tablet By Mouth Once A Day 2)  Warfarin Sodium 5 Mg Tabs (Warfarin Sodium) .... Use As Directed By Anticoagulation Clinic 3)  Klor-Con M20 20 Meq Cr-Tabs (Potassium Chloride Crys Cr) .... Take 1/2 Tablet Daily. Ran Out 2 Weeks Ago 4)  Carvedilol 12.5 Mg Tabs (Carvedilol) .... Take One and One Half  Tablet By Mouth Twice A Day 5)  Furosemide 20 Mg Tabs (Furosemide) .... As Needed 6)  Digoxin 0.125 Mg Tabs (Digoxin) .... Once Daily 7)  Tikosyn 250 Mcg Caps (Dofetilide) .Marland Kitchen.. 1  Tab By Mouth Two Times A Day  Allergies: No Known Drug Allergies  Anticoagulation Management History:      The patient is taking warfarin and comes in today for a routine follow up visit.  Positive risk factors for bleeding include an age of 71 years or older.  The bleeding index is 'intermediate risk'.  Positive CHADS2 values include History of CHF and History of HTN.  Negative CHADS2 values include Age > 71 years old.  His last INR was 2.7 ratio.  Anticoagulation responsible provider: Andreya Lacks.  INR POC: 2.4.  Cuvette Lot#: [id number].  Exp: 05/2011.    Anticoagulation Management Assessment/Plan:      The patient's current anticoagulation dose is Warfarin sodium 5 mg tabs: Use as directed by Anticoagulation Clinic.  The target INR is 2.0-3.0.  The next INR  is due 05/06/2010.  Anticoagulation instructions were given to patient.  Results were reviewed/authorized by Weston Brass, PharmD.  He was notified by Weston Brass PharmD.         Prior Anticoagulation Instructions: INR 2.0  Take an extra 1/2 tablet today then resume same dose of 1 tablet every day.  Recheck INR in 4 weeks.   Current Anticoagulation Instructions: INR 2.4  Continue same dose of 1 tablet every day.  Recheck INR in 4 weeks.

## 2010-05-07 NOTE — Procedures (Signed)
Summary: [facility]   Current Medications (verified): 1)  Lisinopril-Hydrochlorothiazide 20-12.5 Mg Tabs (Lisinopril-Hydrochlorothiazide) .... Take 1 Tablet By Mouth Once A Day 2)  Warfarin Sodium 5 Mg Tabs (Warfarin Sodium) .... Use As Directed By Anticoagulation Clinic 3)  Klor-Con M20 20 Meq Cr-Tabs (Potassium Chloride Crys Cr) .... Take 1/2 Tablet Daily. Ran Out 2 Weeks Ago 4)  Carvedilol 12.5 Mg Tabs (Carvedilol) .... Take One and One Half  Tablet By Mouth Twice A Day 5)  Furosemide 20 Mg Tabs (Furosemide) .... As Needed 6)  Digoxin 0.125 Mg Tabs (Digoxin) .... Once Daily 7)  Tikosyn 250 Mcg Caps (Dofetilide) .Marland Kitchen.. 1  Tab By Mouth Two Times A Day  Allergies (verified): No Known Drug Allergies   ICD Specifications Following MD:  Sherryl Manges, MD     ICD Vendor:  [facility] [employer]     ICD Model Number:  [phone]     ICD Serial Number:  [id number] ICD DOI:  03/05/2010     ICD Implanting MD:  Sherryl Manges, MD  Lead 1:    Location: RA     DOI: 03/05/2010     Model #: [id number]     Serial #: [id number]     Status: active Lead 2:    Location: RV     DOI: 03/05/2010     Model #: 2130     Serial #: [id number]     Status: active Lead 3:    Location: LV     DOI: 03/05/2010     Model #: 4396     Serial #: [id number]     Status: active  Indications::  ICM   ICD Follow Up Battery Voltage:  GOOD V     Charge Time:  8.2 seconds     Battery Est. Longevity:  7 YRS Underlying rhythm:  SR   ICD Device Measurements Atrium:  Amplitude: 3.1 mV, Impedance: 557 ohms, Threshold: 0.7 V at 0.4 msec Right Ventricle:  Amplitude: 25.0 mV, Impedance: 583 ohms, Threshold: 0.9 V at 0.4 msec Left Ventricle:  Amplitude: 25 mV, Impedance: 697 ohms, Threshold: 1.5 V at 1.5 msec Shock Impedance: 65 ohms   Episodes MS Episodes:  0     Shock:  0     ATP:  0     Nonsustained:  0     Atrial Therapies:  0 Atrial Pacing:  100%     Ventricular Pacing:  100%  Brady Parameters Mode DDDR     Lower Rate Limit:  70      Upper Rate Limit 130 PAV 200     Sensed AV Delay:  140  Tachy Zones VF:  240     VT:  210     VT1:  180     Next Cardiology Appt Due:  06/30/2010 Tech Comments:  WOUND CHECK---STERI STRIPS REMOVED.  NO REDNESS OR SWELLING AT SITE.  NORMAL DEVICE FUNCTION.  CHANGED LV OUTPUT TO 2.5 DUE TO PT HAVING DIAPHRAMATIC STIMULATION.  PT WOULD LIKE TO BE ENROLLED IN LATITUDE. ROV 06-30-10 @ [address]. Vella Kohler  March 17, 2010 8:08 PM

## 2010-05-07 NOTE — Assessment & Plan Note (Signed)
Summary: eph/amber   Referring Krystle Polcyn:  Dr. Jens Som  CC:  check up.  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 Note a cardiac catheterization in April of 2009 prior to his procedure showed an ejection fraction of 20%, no coronary disease, a dilated aortic root, moderate aortic stenosis and mild mitral regurgitation. The patient subsequently had aortic valve replacement with 25 mm Carpentier-Edwards  valve, replacement of ascending aortic aneurysm with 38-mm Hemishield graft, modified Cox maze III. Note he did develop atrial fibrillation 6 months after his procedure. A cardioversion was attempted by his report but he only held sinus for one week.  Last echocardiogram performed 8/11 and showed Ejection fraction was 35%, moderate LAE, mild mitral regurgitation; prosthetic aortic valve with mean gradient of 10 mmHg. Holter monitor in Oct 2011 showed atrial flutter with controlled ventricular response. Patient had CRT-D on December 2 of 2011. He was placed on tikosyn for his atrial fibrillation/flutter. His dose was decreased because of increasing QT. Since then the patient denies any dyspnea on exertion, orthopnea, PND, pedal edema, palpitations, syncope or chest pain.   Current Medications (verified): 1)  Lisinopril-Hydrochlorothiazide 20-12.5 Mg Tabs (Lisinopril-Hydrochlorothiazide) .... Take 1 Tablet By Mouth Once A Day 2)  Warfarin Sodium 5 Mg Tabs (Warfarin Sodium) .... Use As Directed By Anticoagulation Clinic 3)  Klor-Con M20 20 Meq Cr-Tabs (Potassium Chloride Crys Cr) .... Take 1/2 Tablet Daily. Ran Out 2 Weeks Ago 4)  Carvedilol 12.5 Mg Tabs (Carvedilol) .... Take One Tablet By Mouth Twice A Day 5)  Furosemide 20 Mg Tabs (Furosemide) .... As Needed 6)  Digoxin 0.125 Mg Tabs (Digoxin) .... Once Daily 7)  Tikosyn 250  Mcg Caps (Dofetilide) .Marland Kitchen.. 1  Tab By Mouth Two Times A Day  Allergies: No Known Drug Allergies  Past History:  Past Medical History: Reviewed history from 11/26/2009 and no changes required. Hypertension LEFT BUNDLE BRANCH BLOCK (ICD-426.3) FIBRILLATION, ATRIAL (ICD-427.31) atrial flutter AORTIC STENOSIS (ICD-424.1) ASCENDING AORTIC ANEURYSM (ICD-441.2) DEGENERATIVE JOINT DISEASE (ICD-715.90) BICUSPID AORTIC VALVE (ICD-746.4) SLEEP APNEA, OBSTRUCTIVE (ICD-327.23) CHOLELITHIASIS (ICD-574.20) NICM  Past Surgical History:  Median sternotomy extracorporeal circulation, aortic valve  replacement with 25 mm Carpentier-Edwards Perimount valve, placement of  ascending aortic aneurysm with 38-mm Hemashield graft, modified Cox maze   III atrial ablation.  appendectomy and Tonsillectomy as a child.  Status post C7 disk removal and fusion in May 2001.   Cholecystectomy  S/P CRT-D  Social History: Reviewed history from 05/07/2009 and no changes required. 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  He has two children and one grandchild.  Tobacco Use - Former.  Alcohol Use - no  Review of Systems       no fevers or chills, productive cough, hemoptysis, dysphasia, odynophagia, melena, hematochezia, dysuria, hematuria, rash, seizure activity, orthopnea, PND, pedal edema, claudication. Remaining systems are negative.  Vital Signs:  Patient profile:   70 year old male Height:      70 inches Weight:      217 pounds BMI:     31.25 Pulse rate:   72 / minute Resp:     16 per minute BP sitting:   138 / 86  (left arm)  Vitals Entered By: Kem Parkinson (March 17, 2010 9:17 AM)  Physical Exam  General:  Well-developed well-nourished in no acute distress.  Skin  is warm and dry.  HEENT is normal.  Neck is supple. No thyromegaly.  Chest is clear to auscultation with normal expansion. ICD site without hematoma or evidence of infection. Cardiovascular exam is regular rate and  rhythm. 2/6 systolic murmur left sternal border. No diastolic murmur noted. Abdominal exam nontender or distended. No masses palpated. Extremities show no edema. neuro grossly intact    EKG  Procedure date:  03/17/2010  Findings:      AV paced.   ICD Specifications Following MD:  Sherryl Manges, MD     ICD Vendor:  [facility] [employer]     ICD Model Number:  [phone]     ICD Serial Number:  [id number] ICD DOI:  03/05/2010     ICD Implanting MD:  Sherryl Manges, MD  Lead 1:    Location: RA     DOI: 03/05/2010     Model #: 1478     Serial #: [id number]     Status: active Lead 2:    Location: RV     DOI: 03/05/2010     Model #: 5784     Serial #: [id number]     Status: active Lead 3:    Location: LV     DOI: 03/05/2010     Model #: 4396     Serial #: [id number]     Status: active  Indications::  ICM   Impression & Recommendations:  Problem # 1:  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 b.i.d. His updated medication list for this problem includes:    Lisinopril-hydrochlorothiazide 20-12.5 Mg Tabs (Lisinopril-hydrochlorothiazide) .Marland Kitchen... Take 1 tablet by mouth once a day    Warfarin Sodium 5 Mg Tabs (Warfarin sodium) ..... Use as directed by anticoagulation clinic    Carvedilol 12.5 Mg Tabs (Carvedilol) .Marland Kitchen... Take one and one half  tablet by mouth twice a day    Furosemide 20 Mg Tabs (Furosemide) .Marland Kitchen... As needed    Digoxin 0.125 Mg Tabs (Digoxin) ..... Once daily    Tikosyn 250 Mcg Caps (Dofetilide) .Marland Kitchen... 1  tab by mouth two times a day  Orders: TLB-Magnesium (Mg) (83735-MG)  Problem # 2:  COUMADIN THERAPY (ICD-V58.61) Goal INR 2-3.  Problem # 3:  ESSENTIAL HYPERTENSION, BENIGN (ICD-401.1)  Blood pressure controlled. Continue present medications. Check potassium and renal function. Check magnesium given tikosyn use. His updated medication list for this problem includes:    Lisinopril-hydrochlorothiazide 20-12.5 Mg Tabs  (Lisinopril-hydrochlorothiazide) .Marland Kitchen... Take 1 tablet by mouth once a day    Carvedilol 12.5 Mg Tabs (Carvedilol) .Marland Kitchen... Take one and one half  tablet by mouth twice a day    Furosemide 20 Mg Tabs (Furosemide) .Marland Kitchen... As needed  Orders: TLB-BMP (Basic Metabolic Panel-BMET) (80048-METABOL)  Problem # 4:  AORTIC VALVE REPLACEMENT, HX OF (ICD-V43.3) Continued SBE prophylaxis.  Problem # 5:  ATRIAL FIBRILLATION AND FLUTTER (ICD-427.31) Continue tikosyn and coumadin His updated medication list for this problem includes:    Warfarin Sodium 5 Mg Tabs (Warfarin sodium) ..... Use as directed by anticoagulation clinic    Carvedilol 12.5 Mg Tabs (Carvedilol) .Marland Kitchen... Take one and one half  tablet by mouth twice a day    Digoxin 0.125 Mg Tabs (Digoxin) ..... Once daily    Tikosyn 250 Mcg Caps (Dofetilide) .Marland Kitchen... 1  tab by mouth two times a day  Problem # 6:  ASCENDING AORTIC ANEURYSM (ICD-441.2) Status post replacement.  Problem # 7:  SLEEP APNEA, OBSTRUCTIVE (ICD-327.23)  Patient Instructions:  1)  Your physician has recommended you make the following change in your medication: INCREASE CARVEDALOL 12.5MG  TAKE ONE AND ONE HALF TABLET TWICE DAILY 2)  Your physician wants you to follow-up in: 6 MONTHS   You will receive a reminder letter in the mail two months in advance. If you don't receive a letter, please call our office to schedule the follow-up appointment. Prescriptions: WARFARIN SODIUM 5 MG TABS (WARFARIN SODIUM) Use as directed by Anticoagulation Clinic  #45 x 3   Entered by:   Deliah Goody, RN   Authorized by:   Ferman Hamming, MD, [facility]   Signed by:   Deliah Goody, RN on 03/17/2010   Method used:   Electronically to        [facility] [phone]* (retail)       [address]       [place], [place]  [address]       Ph: [phone]       Fax: [phone]   RxID:   [id number] CARVEDILOL 12.5 MG TABS (CARVEDILOL) Take one and one half  tablet by mouth  twice a day  #90 x 12   Entered by:   Deliah Goody, RN   Authorized by:   Ferman Hamming, MD, [facility]   Signed by:   Deliah Goody, RN on 03/17/2010   Method used:   Electronically to        [facility] [phone][facility] (retail)       [address]       [place], [place]  [address]       Ph: [phone]       Fax: [phone]   RxID:   [id number]

## 2010-05-07 NOTE — Cardiovascular Report (Signed)
Summary: Office Visit   Office Visit   Imported By: Roderic Ovens 03/24/2010 08:53:13  _____________________________________________________________________  External Attachment:    Type:   Image     Comment:   External Document

## 2010-05-07 NOTE — Cardiovascular Report (Signed)
Summary: CRT-D Warranty Validation and Lead Registrations   CRT-D Warranty Validation and Lead Registrations   Imported By: Roderic Ovens 03/24/2010 08:56:10  _____________________________________________________________________  External Attachment:    Type:   Image     Comment:   External Document

## 2010-06-09 ENCOUNTER — Encounter: Payer: Self-pay | Admitting: Cardiology

## 2010-06-09 DIAGNOSIS — Z954 Presence of other heart-valve replacement: Secondary | ICD-10-CM

## 2010-06-09 DIAGNOSIS — I359 Nonrheumatic aortic valve disorder, unspecified: Secondary | ICD-10-CM

## 2010-06-09 DIAGNOSIS — I4891 Unspecified atrial fibrillation: Secondary | ICD-10-CM

## 2010-06-10 ENCOUNTER — Encounter: Payer: Self-pay | Admitting: Cardiology

## 2010-06-10 ENCOUNTER — Encounter (INDEPENDENT_AMBULATORY_CARE_PROVIDER_SITE_OTHER): Payer: MEDICARE

## 2010-06-10 DIAGNOSIS — Z7901 Long term (current) use of anticoagulants: Secondary | ICD-10-CM

## 2010-06-10 DIAGNOSIS — I4891 Unspecified atrial fibrillation: Secondary | ICD-10-CM

## 2010-06-10 LAB — CONVERTED CEMR LAB: POC INR: 2.4

## 2010-06-12 ENCOUNTER — Encounter (INDEPENDENT_AMBULATORY_CARE_PROVIDER_SITE_OTHER): Payer: Self-pay | Admitting: *Deleted

## 2010-06-15 LAB — BASIC METABOLIC PANEL
BUN: 16 mg/dL (ref 6–23)
CO2: 27 mEq/L (ref 19–32)
CO2: 27 mEq/L (ref 19–32)
Calcium: 8.6 mg/dL (ref 8.4–10.5)
Calcium: 8.7 mg/dL (ref 8.4–10.5)
Chloride: 103 mEq/L (ref 96–112)
Chloride: 104 mEq/L (ref 96–112)
Chloride: 106 mEq/L (ref 96–112)
Creatinine, Ser: 0.83 mg/dL (ref 0.4–1.5)
GFR calc Af Amer: >60 mL/min (ref >60)
GFR calc Af Amer: >60 mL/min (ref >60)
GFR calc Af Amer: >60 mL/min (ref >60)
GFR calc Af Amer: >60 mL/min (ref >60)
GFR calc non Af Amer: >60 mL/min (ref >60)
GFR calc non Af Amer: >60 mL/min (ref >60)
Glucose, Bld: 139 mg/dL — ABNORMAL HIGH (ref 70–99)
Glucose, Bld: 147 mg/dL — ABNORMAL HIGH (ref 70–99)
Glucose, Bld: 148 mg/dL — ABNORMAL HIGH (ref 70–99)
Glucose, Bld: 236 mg/dL — ABNORMAL HIGH (ref 70–99)
Potassium: 4.1 mEq/L (ref 3.5–5.1)
Potassium: 4.1 mEq/L (ref 3.5–5.1)
Sodium: 134 mEq/L — ABNORMAL LOW (ref 135–145)
Sodium: 137 mEq/L (ref 135–145)
Sodium: 137 mEq/L (ref 135–145)
Sodium: 138 mEq/L (ref 135–145)
Sodium: 139 mEq/L (ref 135–145)

## 2010-06-15 LAB — PROTIME-INR
INR: 1.89 — ABNORMAL HIGH (ref 0.00–1.49)
INR: 2.38 — ABNORMAL HIGH (ref 0.00–1.49)
INR: 2.4 — ABNORMAL HIGH (ref 0.00–1.49)
Prothrombin Time: 21.1 seconds — ABNORMAL HIGH (ref 11.6–15.2)
Prothrombin Time: 21.9 seconds — ABNORMAL HIGH (ref 11.6–15.2)

## 2010-06-15 LAB — DIGOXIN LEVEL: Digoxin Level: <0.2 ng/mL — ABNORMAL LOW (ref 0.8–2.0)

## 2010-06-15 LAB — MAGNESIUM: Magnesium: 2.2 mg/dL (ref 1.5–2.5)

## 2010-06-16 ENCOUNTER — Encounter: Payer: Self-pay | Admitting: Internal Medicine

## 2010-06-16 NOTE — Letter (Signed)
Summary: Appointment - Reschedule  [facility], Main Office  [address]   [place], [place] [address]   Phone: [phone]  Fax: [phone]     June 12, 2010 MRN: [id number]   [facility] [address] [place], [place]  [address]   Dear Mr. [facility],   Due to a change in our office schedule, your appointment on 06-30-2010                       at   9:50 a.m.     must be changed.  It is very important that we reach you to reschedule this appointment. We look forward to participating in your health care needs. Please contact us at the number listed above at your earliest convenience to reschedule this appointment.     Sincerely,       Lorne Skeens  [facility] Scheduling Team

## 2010-06-16 NOTE — Medication Information (Signed)
Summary: ccr. per pt call.gd  Anticoagulant Therapy  Managed by: Windell Hummingbird, RN Referring MD: Jens Som MD, Beryle Lathe MD: Myrtis Ser MD, Tinnie Gens Indication 1: Atrial Fibrillation Lab Used: [facility] Site: [address] INR POC 2.4 INR RANGE 2.0-3.0  Dietary changes: no    Health status changes: no    Bleeding/hemorrhagic complications: yes       Details: burst blood vessel in eye (cleared within 24 hours)  Recent/future hospitalizations: no    Any changes in medication regimen? no    Recent/future dental: no  Any missed doses?: no       Is patient compliant with meds? yes       Allergies: No Known Drug Allergies  Anticoagulation Management History:      The patient is taking warfarin and comes in today for a routine follow up visit.  Positive risk factors for bleeding include an age of 71 years or older.  The bleeding index is 'intermediate risk'.  Positive CHADS2 values include History of CHF and History of HTN.  Negative CHADS2 values include Age > 79 years old.  His last INR was 2.7 ratio.  Anticoagulation responsible provider: Myrtis Ser MD, Tinnie Gens.  INR POC: 2.4.  Cuvette Lot#: [id number].  Exp: 04/2011.    Anticoagulation Management Assessment/Plan:      The patient's current anticoagulation dose is Warfarin sodium 5 mg tabs: Use as directed by Anticoagulation Clinic.  The target INR is 2.0-3.0.  The next INR is due 07/08/2010.  Anticoagulation instructions were given to patient.  Results were reviewed/authorized by Windell Hummingbird, RN.  He was notified by Windell Hummingbird, RN.         Prior Anticoagulation Instructions: INR 2.4  Continue same dose of 1 tablet every day.  Recheck INR in 4 weeks.   Current Anticoagulation Instructions: INR 2.4 Continue taking 1 tablet every day. Recheck INR in 4 weeks.

## 2010-06-30 ENCOUNTER — Encounter: Payer: MEDICARE | Admitting: Internal Medicine

## 2010-07-08 ENCOUNTER — Encounter: Payer: MEDICARE | Admitting: *Deleted

## 2010-07-13 ENCOUNTER — Ambulatory Visit (INDEPENDENT_AMBULATORY_CARE_PROVIDER_SITE_OTHER): Payer: MEDICARE | Admitting: *Deleted

## 2010-07-13 DIAGNOSIS — Z954 Presence of other heart-valve replacement: Secondary | ICD-10-CM

## 2010-07-13 DIAGNOSIS — Z7901 Long term (current) use of anticoagulants: Secondary | ICD-10-CM

## 2010-07-13 DIAGNOSIS — I359 Nonrheumatic aortic valve disorder, unspecified: Secondary | ICD-10-CM

## 2010-07-13 DIAGNOSIS — I4891 Unspecified atrial fibrillation: Secondary | ICD-10-CM

## 2010-07-13 NOTE — Patient Instructions (Signed)
INR 2.3 Continue taking 1 tablet (5mg ) daily.  Return in 4 weeks.

## 2010-08-04 ENCOUNTER — Encounter: Payer: Self-pay | Admitting: Internal Medicine

## 2010-08-04 ENCOUNTER — Ambulatory Visit (INDEPENDENT_AMBULATORY_CARE_PROVIDER_SITE_OTHER): Payer: MEDICARE | Admitting: Internal Medicine

## 2010-08-04 ENCOUNTER — Ambulatory Visit (INDEPENDENT_AMBULATORY_CARE_PROVIDER_SITE_OTHER): Payer: MEDICARE | Admitting: *Deleted

## 2010-08-04 DIAGNOSIS — Z95810 Presence of automatic (implantable) cardiac defibrillator: Secondary | ICD-10-CM | POA: Insufficient documentation

## 2010-08-04 DIAGNOSIS — I4891 Unspecified atrial fibrillation: Secondary | ICD-10-CM

## 2010-08-04 DIAGNOSIS — I428 Other cardiomyopathies: Secondary | ICD-10-CM

## 2010-08-04 DIAGNOSIS — I359 Nonrheumatic aortic valve disorder, unspecified: Secondary | ICD-10-CM

## 2010-08-04 DIAGNOSIS — I5022 Chronic systolic (congestive) heart failure: Secondary | ICD-10-CM

## 2010-08-04 DIAGNOSIS — Z954 Presence of other heart-valve replacement: Secondary | ICD-10-CM

## 2010-08-04 LAB — BASIC METABOLIC PANEL
BUN: 19 mg/dL (ref 6–23)
Chloride: 103 mEq/L (ref 96–112)
Potassium: 4.1 mEq/L (ref 3.5–5.1)

## 2010-08-04 LAB — CBC WITH DIFFERENTIAL/PLATELET
Eosinophils Relative: 2.3 % (ref 0.0–5.0)
HCT: 41.9 % (ref 39.0–52.0)
Hemoglobin: 14.7 g/dL (ref 13.0–17.0)
Lymphs Abs: 1 10*3/uL (ref 0.7–4.0)
Monocytes Relative: 7.1 % (ref 3.0–12.0)
Neutro Abs: 4.5 10*3/uL (ref 1.4–7.7)
RDW: 13.3 % (ref 11.5–14.6)
WBC: 6.1 10*3/uL (ref 4.5–10.5)

## 2010-08-04 LAB — MAGNESIUM: Magnesium: 2.1 mg/dL (ref 1.5–2.5)

## 2010-08-04 LAB — POCT INR: INR: 1.8

## 2010-08-04 MED ORDER — DABIGATRAN ETEXILATE MESYLATE 150 MG PO CAPS: 150.0000 mg | ORAL_CAPSULE | Freq: Two times a day (BID) | ORAL | Status: DC

## 2010-08-04 MED ORDER — LISINOPRIL 20 MG PO TABS: 20.0000 mg | ORAL_TABLET | Freq: Every day | ORAL | Status: DC

## 2010-08-04 NOTE — Progress Notes (Signed)
HPI: [name] is a 70 y.o. male  Is seen  Following COPD implantation in the fall 2011. He also had a history of atrial flutter. This occurred following porcine aortic valve replacement in 2009 as well as Cox maze procedure for atrial fibrillation.   He had undergone cardiac catheterization in April of 2009 prior to his procedure showed an ejection fraction of 20%, no coronary disease, a dilated aortic root, moderate aortic stenosis and mild mitral regurgitation. The patient subsequently had aortic valve replacement with 25 mm Carpentier-Edwards  valve, replacement of ascending aortic aneurysm with 38-mm Hemishield graft, modified Cox maze III.    Last echocardiogram performed 8/11 and showed Ejection fraction was 35%, moderate LAE, mild mitral regurgitation; prosthetic aortic valve with mean gradient of 10 mmHg.    He was placed on tikosyn for his atrial fibrillation/flutter. His dose was decreased because of increasing QT.   Since then the patient denies any dyspnea on exertion, orthopnea, PND, pedal edema, palpitations, syncope or chest pain    Current Outpatient Prescriptions  Medication Sig Dispense Refill  . carvedilol (COREG) 12.5 MG tablet Take 18.75 mg by mouth 2 (two) times daily with a meal.        . digoxin (LANOXIN) 0.125 MG tablet Take 125 mcg by mouth daily.        Marland Kitchen dofetilide (TIKOSYN) 250 MCG capsule Take 250 mcg by mouth 2 (two) times daily.        Marland Kitchen lisinopril-hydrochlorothiazide (PRINZIDE,ZESTORETIC) 20-12.5 MG per tablet Take 1 tablet by mouth daily.        . metFORMIN (GLUCOPHAGE) 500 MG tablet Take 500 mg by mouth daily with breakfast.        . potassium chloride SA (K-DUR,KLOR-CON) 20 MEQ tablet Take 10 mEq by mouth daily.        Marland Kitchen warfarin (COUMADIN) 5 MG tablet Take by mouth as directed.        Marland Kitchen DISCONTD: furosemide (LASIX) 20 MG tablet Take 20 mg by mouth as needed.          No Known Allergies  Past Medical History  Diagnosis Date  . LBBB (left  bundle branch block)   . Atrial fibrillation   . Atrial flutter   . Aortal stenosis   . Ascending aortic aneurysm   . DJD (degenerative joint disease)   . Bicuspid aortic valve   . OSA (obstructive sleep apnea)   . Cholelithiases   . Non-ischemic cardiomyopathy     Past Surgical History  Procedure Date  . Median sternotomy   . Aortic valve replacement   . Appendectomy   . Tonsillectomy   . Cervical discectomy 5/01    C7  . Cholecystectomy     No family history on file.  History   Social History  . Marital Status: Married    Spouse Name: N/A    Number of Children: 2  . Years of Education: N/A   Occupational History  . retired Art gallery manager    Social History Main Topics  . Smoking status: Former Games developer  . Smokeless tobacco: Not on file  . Alcohol Use: No  . Drug Use: Not on file  . Sexually Active: Not on file   Other Topics Concern  . Not on file   Social History Narrative  . No narrative on file    Fourteen point review of systems was negative except as noted in HPI and PMH   PHYSICAL EXAMINATION  Blood pressure 140/80, pulse 64, height 5'  10" (1.778 m), weight 222 lb (100.699 kg).   Well developed and nourished in no acute distress HENT normal Neck supple with JVP-flat Carotids brisk and full without bruits Back without scoliosis or kyphosis Clear Regular rate and rhythm,S4 and a 1-2/6 systolic murmur at the right upper sternal border Abd-soft with active BS without hepatomegaly or midline pulsation Femoral pulses 2+ distal pulses intact No Clubbing cyanosis; 1-2+ edema Skin-warm and dry LN-neg submandibular and supraclavicular A & Oriented CN 3-12 normal  Grossly normal sensory and motor function Affect engaging .

## 2010-08-04 NOTE — Patient Instructions (Signed)
Your physician recommends that you have lab work today: bmp/ digoxin/ magnesium/ cbc (427.31).  You will need labwork 10 days after starting Pradaxa: cbc (427.31).  Your physician has recommended you make the following change in your medication:  1) Take lasix 20mg  every other day 2) Take potassium 10 meq every other day with lasix 3) Stop lisinopril/ hctz. 4) Start lisinopril 20mg  once daily  5) Stop Coumadin 6) Start Pradaxa 150mg  twice daily.  Your physician wants you to follow-up in: 6 months with Dr. Jens Som & 1 year with Dr. Graciela Husbands. You will receive a reminder letter in the mail two months in advance. If you don't receive a letter, please call our office to schedule the follow-up appointment.

## 2010-08-04 NOTE — Assessment & Plan Note (Signed)
No recurrent atrial fibrillation as detected by his device. He will continue on Tikosyn. We will check his potassium and magnesium levels today.  He is on hydrochlorothiazide; this is contraindicated in the setting of Tikosyn therapy. We will change it to furosemide 20 mg every other day.  He also would like to switch to Pradaxa. We reviewed the potential benefits as well as risks of the Pradaxa. He is agreeable

## 2010-08-04 NOTE — Assessment & Plan Note (Signed)
Stable on current medications. We'll check his digoxin level

## 2010-08-04 NOTE — Assessment & Plan Note (Signed)
The patient's device was interrogated.  The information was reviewed. No changes were made in the programming.    

## 2010-08-18 NOTE — Consult Note (Signed)
NAME:  [name], [name]                ACCOUNT NO.:  [id number]   MEDICAL RECORD NO.:  [id number]           PATIENT TYPE:   LOCATION:                                 FACILITY:   PHYSICIAN:  Salvatore Decent. Dorris Fetch, M.D.DATE OF BIRTH:  1941-03-23   DATE OF CONSULTATION:  07/31/2007  DATE OF DISCHARGE:                                 CONSULTATION   CHIEF COMPLAINT:  [name] is a 70 year old gentleman who presents  with a chief complaint of shortness of breath.   HISTORY OF PRESENT ILLNESS:  [name] is a 70 year old gentleman with  a history of aortic stenosis with bicuspid aortic valve, dilated  ascending aorta, and paroxysmal atrial fibrillation.  His aortic valve  area was 1.2 cm with a mean gradient of 23 and a max gradient of 35 in  December 2008.  He was admitted at that time with atrial fibrillation.  He was started on Betapace.  He initially had improvement with that, but  now presents with increasing dyspnea on exertion.  He has also had  orthopnea.  He came to the emergency room on July 28, 2007, with  progressively worsening shortness of breath.  Today, he had cardiac  catheterization, which showed severe left ventricular dysfunction with  an ejection fraction estimated at 25%.  He had normal coronary arteries.  His aortic valve area calculated at 1.1 sq. cm with a mean gradient of  13 mmHg.  His cardiac index was 1.8.  Right ventricular pressure was  40/10 and PA was 50/20.  His wedge was 25.  He had marked dilatation of  his aortic root and ascending aorta.  There was mild aortic  insufficiency.  Of note, when Dr. Jenne [name] was doing the catheterization,  he noted a great deal of difficulty crossing the aortic valve and  thought that the valve may be considerably tighter than the calculated  numbers.   [name] is currently symptom free.   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   MEDICATIONS ON ADMISSION:  1. Diovan 160 mg daily.  2. Hydrochlorothiazide 25 mg daily.  3. Cardura 4 mg daily.  4. Coumadin 5 mg daily except 7.5 on Tuesday and Saturday.  5. Betapace 80 mg b.i.d.   ALLERGIES:  He has no known drug allergies.   FAMILY HISTORY:  No coronary disease.  Father died with COPD.   SOCIAL HISTORY:  He is a retired Art gallery manager.  He is married.  He has 2  daughters and 1 grandchild.  He is a nonsmoker.   REVIEW OF SYSTEMS:  Denies any previous prostate trouble, but did  require catheterization and urinary retention post catheterization.  Recent diagnosis of sleep apnea, it was treated with CPAP.  He bruises  and bleeds easily since he is on Coumadin.  All other systems are  negative.   PHYSICAL EXAMINATION:  GENERAL:  [name] is a 70 year old white male  in no acute distress.  He is well developed and well nourished.  NEUROLOGICAL:  He is alert and oriented x3 with no focal deficits.  HEENT:  Unremarkable.  NECK:  Supple without thyromegaly or adenopathy or bruits.  CARDIAC:  Regular rate and rhythm.  There is a 2/6 systolic murmur at  the right upper sternal border.  LUNGS:  Clear anteriorly.  The patient is supine post catheterization.  ABDOMEN:  Soft and nontender.  EXTREMITIES:  Without clubbing, cyanosis, or edema.  He has palpable  pulses.   LABORATORY DATA:  Sodium 135, potassium 4.3, BUN 21, creatinine 1.2,  white count 11.3, hematocrit 47, and platelets 249,000.  His PT was 27.9  on admission with an INR of 2.5, it was 15.9 with an INR of 1.2.  Cardiac enzymes were negative x3.  Portable chest x-ray on the 24th  showed enlarged heart and congestive heart failure.   IMPRESSION:  [name] is a 70 year old gentleman with known aortic   stenosis which has been moderate, congestive heart failure, and  paroxysmal atrial fibrillation.  He also has a dilated ascending aorta,  5.2 cm.  During catheterization today, he was found to have normal  coronaries, but when on attempts to cross the aortic valve it was very  difficult and the appearances of the films were discussed with Dr.  Jenne [name].  It appears his aortic valve may be quite a bit more stenotic  than calculated numbers that indicate.  I agree with Dr. Mikey Bussing plan  for a transesophageal echocardiogram to further evaluate the aortic  valve and I think we may need to give serious consideration to aortic  valve replacement as well as root and ascending aorta replacement with  or without a concomitant maze procedure.  We will await the results of  the transesophageal echocardiogram, and I will meet with the patient  again as well as discuss further with cardiology at that time.      Salvatore Decent Dorris Fetch, M.D.  Electronically Signed     SCH/MEDQ  D:  07/31/2007  T:  08/01/2007  Job:  [id number]   cc:   Madaline Savage, M.D.

## 2010-08-18 NOTE — Assessment & Plan Note (Signed)
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  Postoperatively, he did quite well.  He saw Dr. Elsie [name] recently and was  told he had been in sinus rhythm since July and his Coumadin and other  atrial fibrillation medications were discontinued.  He currently is  taking Diovan 160 mg daily and a baby aspirin.   PHYSICAL EXAMINATION:  GENERAL:  The patient is a well-appearing 70-year-  old white male, in no acute distress.  VITAL SIGNS:  His blood pressure is 162/91, pulse 64, respirations are  18, and his ox saturation 95% on room air.  LUNGS:  Clear with equal breath sounds bilaterally.  There is no rales  or wheezing.  CARDIAC:  Has a faint systolic murmur at the right upper sternal border.  Has a regular rate and rhythm.  His sternum is stable.  Sternal incision  is well healed.   A rhythm strip shows normal sinus rhythm with a rate of approximately  70.   IMPRESSION:  The patient is a 70 year old gentleman.  He is status post  aortic valve replacement and a maze procedure.  He is doing extremely  well at this point in time.  He states he has energy that he had had for  years.  He had thought he was just getting older, but his exercise  tolerance is improved dramatically.  He is not really as weak and tired  as he had been prior to surgery.  He is off Coumadin and  antiarrhythmics.  Currently, she is taking Diovan and baby aspirin.  We  did not make any medication changes today.  I will see him back in about  6 months just to check his rhythm.  Once again, he will also continued  to be  followed by Dr.  Elsie [name] during that time.  Obviously, I will be happy to see him back at  anytime in the interim, if he were to have any.   [name] Dorris Fetch, M.D.  Electronically Signed   SCH/MEDQ  D:  03/18/2008  T:  03/18/2008  Job:  [id number]   cc:   [name], M.D.  [name], M.D.

## 2010-08-18 NOTE — Assessment & Plan Note (Signed)
OFFICE VISIT   [name], [name]  DOB:  Jun 21, 1940                                        September 29, 2007  CHART #:  [id number]   The patient is a 70 year old gentleman who had aortic valve replacement,  replacement of the ascending aorta, and a Cox Maze procedure done on Aug 07, 2007.  He tells me he was readmitted to the hospital last week.  I  was out of the town.  Dr. Elsie Lincoln readmitted him for fluid overload.  He  was diuresed and had left thoracentesis.  A 1.2 liters of fluid was  removed with a thoracentesis,  and he had marked symptomatic  improvement.  He states that since that time he has been on 20 mg of  Lasix on a p.r.n. basis but has not been taking it every day.   PHYSICAL EXAMINATION:  GENERAL:  The patient is a 70 year old white male  in no acute distress.  VITAL SIGNS:  Blood pressure 134/84, pulse 64, respirations 18, and his  oxygen saturation is 90% on room air.  LUNGS:  Diminished breath sounds at the bases.  There is no wheezing.  CARDIAC:  Regular rate and rhythm with no murmur.  EXTREMITIES:  Traced 1+ pitting edema.   Rhythm strip shows no clear P-wave, but it is a regular rhythm.   IMPRESSION:  The patient looks and feels much better today.  He does  still appear to be volume overloaded.  He has had partial reaccumulation  of his left pleural effusion.  He does have a small right pleural  effusion as well as just some pulmonary vascular congestion and some  peripheral edema, and I think there is any indication for thoracentesis  at this time, but I do think he needs to increase his diuretic for the  next several days.  I have advised him to take 40 mg of Lasix daily for  the next 3 days, then take 20 mg daily for the next 2 weeks and after  that he can go back to the p.r.n. basis, as he was instructed when he  left the hospital.  I will plan to see him  back in about a month.  We will checking another chest x-ray to check on  his  progress at that time.   Salvatore Decent Dorris Fetch, M.D.  Electronically Signed   SCH/MEDQ  D:  09/29/2007  T:  09/30/2007  Job:  [id number]   cc:   Madaline Savage, M.D.  Gaspar Garbe, M.D.

## 2010-08-18 NOTE — Assessment & Plan Note (Signed)
OFFICE VISIT   [name], [name]  DOB:  [birth year]                                        September 07, 2007  CHART #:  [id number]   Mr. [name] is a 70 year old gentleman who had aortic stenosis, ascending  aortic aneurysm, and chronic atrial fibrillation.  He had an aortic  valve replacement, as well as replacement of the ascending aorta and  modified Cox-Maze procedure done on Aug 07, 2007.  Postoperatively, he  had some anemia and thrombocytopenia, as well as some bradycardia, but  eventually was discharged home on the 12th without any major  complications.  After getting home, he was on the Lasix for about a week  and did well for the first 2 weeks.  He saw Dr. Elsie Lincoln last week and was  significantly volume overloaded with peripheral edema and decreased  breath sounds.  He was started on Bumex.  He has lost about 4 pounds  since that time.  He says his heart rhythm has been regular for the  majority of the time; however, he had noticed 1 episode last week that  lasted about 10 minutes, where it was irregular.  He remains on  Coumadin.  Dr. Elsie Lincoln is managing his INR and that has been in a stable  situation.   PHYSICAL EXAMINATION:  Mr. [name] is a 70 year old white male in no  acute distress.  Blood pressure 142/88, pulse 79, respirations are 20,  and his oxygen saturation is 93% on room air.  He has markedly decreased  breath sounds at the bases.  Cardiac exam has a regular rate and rhythm.  Normal S1 and S2.  There is no murmur or rub.  His abdomen is  protuberant, but benign.  His extremities have 2-3 plus pitting edema.   LABORATORY DATA:  Chest x-ray shows bilateral pleural effusions and  pulmonary congestion.   IMPRESSION:  Mr. [name] is a 70 year old gentleman.  He is status post  aortic valve replacement, as well as repair of an ascending aneurysm and  Maze procedure for chronic atrial fibrillation.  He is maintaining sinus  rhythm, although he may  have had a very brief episode of atrial  fibrillation, which was undocumented about a week ago.  He does remain  at risk for having atrial fibrillation for about the next 6 months, and  will always have some risk after that but the risk markedly decreases  after 6 months.  There is about 85% long-term cure rate.  He will remain  on Coumadin for the near future.  His biggest problem currently is  volume overload.  He was started on Bumex last week and it sounds like  he started to diuresis.  He has noted some improvement in his peripheral  edema.  He still is short of breath and has orthopnea and aggravating  cough.  He is about 4 pounds negative since starting Bumex a week ago.   I advised to Mr. and Mrs. [name] to check his weight on a daily basis,  which they have been doing.  If he were to gain more than 3 pounds a day  or 5 pounds in 3 days, they should call either [place] or Dr. Elsie Lincoln to get  his diuretic dose increased.  I am going to plan on seeing him back in  about 3 weeks.  We will check another chest x-ray at that time.  It is  possible that he might need a thoracentesis.  Right now, I think it is  primarily related to heart failure.  If he were to develop any more  acute respiratory symptoms in the meantime, he was instructed to call  our office.  From a pain standpoint, he is doing well.  He may begin  driving.  He should not lift any objects which are greater than 10  pounds for at least another 2 weeks.   Salvatore Decent Dorris Fetch, M.D.  Electronically Signed   SCH/MEDQ  D:  09/07/2007  T:  09/08/2007  Job:  [id number]   cc:   Madaline Savage, M.D.  Gaspar Garbe, M.D.

## 2010-08-18 NOTE — H&P (Signed)
NAME:  DAVYON, FISCH                ACCOUNT NO.:  [id number]   MEDICAL RECORD NO.:  [id number]          PATIENT TYPE:  EMS   LOCATION:  MAJO                         FACILITY:  [facility]   PHYSICIAN:  Madaline Savage, M.D.DATE OF BIRTH:  April 13, 1940   DATE OF ADMISSION:  07/28/2007  DATE OF DISCHARGE:                              HISTORY & PHYSICAL   CHIEF COMPLAINT:  Shortness of breath.   HISTORY OF PRESENT ILLNESS:  Mr. Ketchum is a 70 year old male followed  by Dr. Elsie Lincoln, with a history of PAF and aortic stenosis.  His last  echocardiogram was in December 2008.  His aortic valve area at that time  was 1.2 cm, with a mean gradient of 23 mm and a maximum gradient of 35  mm.  He was seen in the office and found to be in atrial fibrillation.  He was actually admitted for atrial fibrillation back in December 2008.  He was seen by Dr. Jenne Campus and Rudi Coco, N.P.  He was put on  Betapace.  His diet and was cut back because of some hypotension.  He  did well initially, but he says for the past month he has been having  increasing dyspnea on exertion.  For the past several days, he has had  frank orthopnea and has been sleeping in a recliner.  He finally came to  the emergency room earlier this morning.  He denies chest pain.  He  cannot always tell when his heart rhythm is irregular, but he has a  feeling that it has been irregular for about a month.   PAST MEDICAL HISTORY:  1. PAF as noted.  2. He has aortic stenosis as noted.  3. He has treated hypertension and recently has been somewhat      hypotensive.  4. He has a chronic left bundle branch block.  5. He has DJD, and had C-spine surgery in May 2001.  6. CT of his chest in December 2008 showed a dilated ascending aorta      at 5.2 cm.  7. He had an incidental finding of cholelithiasis on ultrasound in      2006.   CURRENT MEDICATIONS:  1. Diovan 160 mg a day.  2. HCTZ 25 mg a day.  3. Cardura 4 mg a day.  4. Coumadin 5 mg a  day, with 7.5 on Tuesday and Saturday.  5. Betapace 80 mg b.i.d.   He has no known drug allergies.   SOCIAL HISTORY:  He is married.  He is a retired Art gallery manager.  He has two  daughters and one grandchild.  He is a nonsmoker.   FAMILY HISTORY:  Unremarkable for coronary artery disease.  His mother  died at 26.  His father died at 88 of COPD.   REVIEW OF SYSTEMS:  Essentially unremarkable, except for as noted above.  He denies any prostate trouble.  He has not had anginal symptoms.  He  did have a nuclear stress test in the office recently, and this was  negative for ischemia.  He was scheduled to have an echocardiogram this  next  week.  He has also recently been diagnosed with sleep apnea and was  actually to start CPAP today.   PHYSICAL EXAMINATION:  VITAL SIGNS:  Blood pressure 126/100, pulse 96,  temperature 97, respirations 16.  GENERAL:  He is a well-developed overweight male in no acute distress.  HEENT: Normocephalic.  He wears glasses.  NECK:  Without obvious JVD.  He does have a transmitted murmur to his  carotids.  CHEST:  Reveals rales at the left base and halfway up on the right.  CARDIAC:  Reveals irregularly irregular rhythm, with a 2/6 systolic  murmur loudest at the aortic valve area and left sternal border.  ABDOMEN:  Obese and nontender.  EXTREMITIES: Without edema.  Distal pulses are intact.  There are no  femoral bruits noted.  NEUROLOGIC:  Grossly intact.  He is awake, alert, and oriented,  cooperative.  Moves all extremities without obvious deficit.  SKIN:  Warm and dry.   EKG:  Shows atrial fibrillation, with left bundle branch block, with a  rate of 94.   LABORATORIES:  Sodium 135, potassium 4.3, BUN 21, creatinine 1.2.  INR  is 2.5.  White count 11.3, hemoglobin 15.9, hematocrit 46.8, platelets  249.  Troponin is negative x1.   CHEST X-RAY:  Shows cardiomegaly and congestive heart failure.   IMPRESSION:  1. Congestive heart failure, probably secondary  to aortic stenosis and      atrial fibrillation.  2. Paroxysmal atrial fibrillation.  The patient has been on Betapace      but apparently has been in atrial fibrillation for the last few      weeks.  3. Coumadin therapy, with therapeutic INR.  4. Moderate aortic stenosis by echocardiogram in December 2008, with      an aortic valve area 1.2 sq cm, and a mean gradient of 23 mmHg,      with normal LV function.  5. Chronic left bundle branch block.  6. Dilated ascending aorta at 5.2 cm by CT scan in December 2008.  7. Recent diagnosis of sleep apnea.  The patient was to be fitted for      CPAP today.  8. Treated hypertension.  9. Coumadin therapy.   PLAN:  The patient will be admitted to telemetry.  We will start him on  Lasix 80 mg b.i.d. for 2 days and then reassess his response.  For now,  we will continue his Betapace 80 mg b.i.d., and his Coumadin will be put  on hold today pending physician evaluation for possible catheterization  Monday.      Abelino Derrick, P.A.    ______________________________  Madaline Savage, M.D.    Lenard Lance  D:  07/28/2007  T:  07/28/2007  Job:  [id number]

## 2010-08-18 NOTE — Discharge Summary (Signed)
NAME:  [name], [name]                ACCOUNT NO.:  [id number]   MEDICAL RECORD NO.:  [id number]          PATIENT TYPE:  INP   LOCATION:  2010                         FACILITY:  [facility]   PHYSICIAN:  Salvatore Decent. Dorris [name], M.D.DATE OF BIRTH:  1940/11/09   DATE OF ADMISSION:  07/28/2007  DATE OF DISCHARGE:  08/15/2007                               DISCHARGE SUMMARY   FINAL DIAGNOSES:  1. Aortic stenosis.  2. Ascending aortic aneurysm.  3. Chronic atrial fibrillation.   IN-HOSPITAL DIAGNOSES:  1. Acute blood loss anemia postoperatively.  2. Thrombocytopenia postoperatively.  3. Bradycardia postoperatively.   SECONDARY DIAGNOSES:  1. Chronic left bundle-branch block.  2. Hypertension.  3. Cholelithiasis.  4. Degenerative joint disease.  5. Status post C7 disk removal and fusion in May 2001.   IN-HOSPITAL OPERATIONS AND PROCEDURES:  1. Cardiac catheterization.  2. Aortic valve replacement with 25-mm Carpentier-Edwards Perimount      valve.  3. Replacement of ascending aortic aneurysm with 38-mm Hemashield      graft.  4. Modified Cox-Maze III atrial ablation.   HISTORY AND PHYSICAL AND HOSPITAL COURSE:  [name] is a 70 year old  gentleman with a history of moderate aortic stenosis.  He also has known  enlarged ascending aorta and a history of paroxysmal atrial  fibrillation.  He presented with congestive heart failure.  The patient  was found to have normal coronary arteries by catheterization, but the  aortic valve was very difficult to cross; and there is question that the  aortic valve may be more stenotic than the calculated numbers indicated.  Transesophageal echocardiogram showed moderate-to-severe aortic stenosis  and 1+ mitral regurgitation.  [name] saw and evaluated the  patient.  He discussed with the patient undergoing aortic valve  replacement with replacement of ascending aortic aneurysm and Cox-Maze  procedure.  He discussed the risks and benefits with  the patient.  The  patient nods understanding and agreed to proceed.  Surgery was scheduled  for Aug 07, 2007.  Preoperatively, the patient had bilateral carotid  duplex ultrasound which showed no significant ICA stenosis.  The patient  remained stable preoperatively.  For details of the patient's past  medical history and physical exam, please see dictated H and P.   The patient was taken to the operating room on Aug 07, 2007, where he  underwent aortic valve replacement with 25-mm Carpentier-Edwards  Perimount valve, replacement of ascending aortic aneurysm with a 38-mm  Hemashield graft, and modified Cox-Maze III atrial ablation.  The  patient tolerated this procedure well and transferred to the Intensive  Care Unit in stable condition.  Postoperatively, the patient was noted  to be hemodynamically stable.  He was extubated in the evening of  surgery.  Post extubation, he is noted to be alert and oriented x4 and  neuro intact.  Postoperatively, chest x-ray done on postop day #1 was  stable.  The patient had minimum drainage from chest tubes, and chest  tube was discontinued in normal fashion.  The patient was encouraged to  use his incentive spirometer.  Repeat chest x-ray on postop day #  3  showed mild bilateral pleural effusions.  The patient was on diuretics  for volume overload as well as infusions.  He continued to use his  incentive spirometer.  The patient was able to be weaned off oxygen,  sating greater than 90% on room air.  Repeat chest x-ray was done today,  Aug 15, 2007, prior to discharge home, which showed improving bilateral  pleural effusions and noted to be stable.  Following surgery, the  patient was placed on AAI pacing.  He was on low-dose dopamine, and this  was weaned off as tolerated.  Postoperatively, the patient was noted to  be in normal sinus rhythm.  He was restarted on his Coumadin dosing as  well as Sotalol and doxazosin as well as ACE inhibitor.  The  patient is  initially tolerated these.  On postop day #2, the patient went back into  rapid atrial fibrillation.  He was started on IV amiodarone drip.  He  was also continued on all other medications.  The patient converted back  to normal sinus rhythm after starting amiodarone IV.  On postop day #3,  the patient bradied down into the 40s.  He was placed back on A pacing  at 80.  The patient improved with A pacing.  He is maintaining heart  rate in the 80s.  He maintained normal sinus rhythm.  IV amiodarone was  discontinued, and he was switched to p.o. amiodarone.  The patient's  underlying rhythm of pacer was 55-60.  He was in normal sinus rhythm.  On postop day #5, it was felt due to the patient's bradycardic episode,  best to stop lisinopril as well as Lopressor and to hold the Cardura.  The patient remained on the pacer.  He had a brief episode of rapid  atrial fibrillation on postop day #6.  Next, the pacer was disconnected.  The patient converted back to normal sinus rhythm and hence remained in  normal sinus rhythm for the past 24 hours.  Blood pressure is stable.  Heart rate is in the 70s.  He is continued on amiodarone p.o., but no  beta-blocker or ACE inhibitor.  We will hold off on the ACE as well as  the patient's Cardura and potentially restart as an outpatient.  During  this time, the patient did develop acute blood loss anemia.  He was  asymptomatic and never required any transfusions.  Hemoglobin and  hematocrit were monitored, and they remained stable prior to discharge.  The patient was restarted on Coumadin as stated above, and then INRs  were obtained.  Last INR was 1.5 on postop day #8.  Plan is to follow up  with [name] as an outpatient for management.  The patient did have  volume overload postoperatively, and he is to continue on diuretics.  He  is 1.3 kg above his preoperative weight.  The patient is out of bed,  ambulating well with assistance.  He is  tolerating diet well.  No nausea  or vomiting noted.   On postop day #8, the patient is afebrile.  He is in normal sinus rhythm  with heart rate in the 70s.  Blood pressure is 120s over 70s.  He is  sating greater than 90% on room air.   LABORATORY DATA:  On postop day #8 showed an INR of 1.5 with sodium of  136, potassium 4.8, chloride 96, bicarb 30, BUN 21, creatinine 1.10, and  glucose of 146.  Last CBC was done  on Aug 13, 2007, postop day #6,  showed a white count of 8, hemoglobin 9.4, hematocrit 26.9, and platelet  count 209.  Chest x-ray today showed small bilateral pleural effusions  but stable.  All incisions are clean, dry, and intact and healing well.  The patient is tentatively ready for discharge home today, postop day  #8, Aug 15, 2007.   FOLLOWUP APPOINTMENTS:  A followup appointment has been arranged with  [name] for September 07, 2007, at 11.45 a.m.  The patient will need  to obtain an AP and lateral chest x-ray 30 minutes prior to this  appointment.  The patient will need to follow up with [name] in 2  weeks.  He needs to contact Dr. Truett Perna office and make these  arrangements.  The patient will need to obtain a PT/INR and blood work  on Aug 17, 2007, at the [facility].   ACTIVITY:  The patient was instructed no driving until released to do  so, no heavy lifting over 10 pounds.  He is told to ambulate 3-4 times  per day, progress as tolerated, and to continue his breathing exercises.   INCISIONAL CARE:  The patient was told to shower, washing his incisions  using soap and water.  He is to contact the office if he develops any  drainage or opening from any of his incision sites.   DIET:  The patient is educated on diet to be low fat, low salt.   DISCHARGE MEDICATIONS:  1. Aspirin 81 mg daily.  2. Coumadin 2.5 mg at night.  3. Amiodarone 200 mg b.i.d.  4. Lasix 40 mg daily x1 week.  5. Potassium chloride 20 mEq daily x1 week.   6. Ultram 50 mg 1-2 tablets q.4-6 h. p.r.n.      Theda Belfast, PA      Salvatore Decent. Dorris [name], M.D.  Electronically Signed    KMD/MEDQ  D:  08/15/2007  T:  08/16/2007  Job:  [id number]   cc:   Salvatore Decent. Dorris [name], M.D.  Madaline Savage, M.D.

## 2010-08-18 NOTE — Assessment & Plan Note (Signed)
OFFICE VISIT   [name], [name]  DOB:  1940-12-04                                        Aug 05, 2008  CHART #:  [id number]   The patient is a 70 year old gentleman, who had aortic valve  replacement, repair of an ascending aortic aneurysm, and Cox maze  procedure a year ago for aortic stenosis and chronic atrial  fibrillation.  He returns today for check on his rhythm.  He has been  off his amiodarone, digoxin, and Coumadin for almost a year now.  He had  been taking Diovan, but that was discontinued.  He was started on  amlodipine 5 mg daily.  The patient says that he feels better than he  has in years.  He is able to work physically all day.  He does not have  any chest pain, shortness of breath.  He has not noted any irregular or  rapid rhythms.   His current medications are aspirin 81 mg daily, amlodipine 5 mg daily,  and Lasix.   He has no known drug allergies.   PHYSICAL EXAMINATION:  GENERAL:  The patient is a well-appearing 70-year-  old gentleman in no acute distress.  VITAL SIGNS:  His blood pressure is 166/82, pulse 43, respirations are  18, and his oxygen saturation is 94% on room air.  LUNGS:  Clear.  CARDIAC:  Regularly irregular rhythm.  There is a faint systolic murmur.  CHEST:  Sternal incision is well healed.   LABORATORY DATA:  His rhythm strip today shows atrial bigeminy.   IMPRESSION:  The patient is a 70 year old gentleman.  He is now a year  out from aortic valve replacement and maze procedure as well as  replacement of the ascending aorta.  He is doing extremely well at this  point in time.  He says he feels better than he had in years prior to  surgery which is not surprising given the severity of his aortic  stenosis.  His blood pressure is elevated.  He was started on Norvasc  and told that he might need to go up on the dose if his blood pressure  did not come down with 5 mg daily.  I told him to go ahead and increase  his  blood pressure medication to 10 mg daily and to check back with Dr.  Elsie Lincoln and Dr. Wylene Simmer to have his blood pressure rechecked to make sure  it does come down.  His Diovan was stopped because of cost that he was  having trouble meeting.  Overall, I think the patient is doing well.  I  will plan to see him back in a year to check on his progress.   Salvatore Decent Dorris Fetch, M.D.  Electronically Signed   SCH/MEDQ  D:  08/05/2008  T:  08/06/2008  Job:  [id number]   cc:   Madaline Savage, M.D.  Gaspar Garbe, M.D.

## 2010-08-18 NOTE — Op Note (Signed)
NAME:  [name], [name]                ACCOUNT NO.:  [id number]   MEDICAL RECORD NO.:  [id number]          PATIENT TYPE:  INP   LOCATION:  2306                         FACILITY:  [facility]   PHYSICIAN:  Salvatore Decent. Dorris Fetch, M.D.DATE OF BIRTH:  May 01, 1940   DATE OF PROCEDURE:  08/07/2007  DATE OF DISCHARGE:                               OPERATIVE REPORT   PREOPERATIVE DIAGNOSES:  1. Aortic stenosis.  2. Ascending aortic aneurysm.  3. Chronic atrial fibrillation.   POSTOPERATIVE DIAGNOSES:  1. Aortic stenosis.  2. Ascending aortic aneurysm.  3. Chronic atrial fibrillation.   PROCEDURE:  Median sternotomy extracorporeal circulation, aortic valve  replacement with 25 mm Carpentier-Edwards Perimount valve, placement of  ascending aortic aneurysm with 38-mm Hemashield graft, modified Cox maze  III atrial ablation.   SURGEON:  Salvatore Decent. Dorris Fetch, MD   ASSISTANT:  Gershon Crane, PA-C.   ANESTHESIA:  General.   FINDINGS:  Moderately severe aortic stenosis, general bicuspid valve  with heavy calcification on the left coronary side, 180-degrees origin  of the coronary arteries, normal morphology of the aortic root to the  level of sinotubular junction,  poststenotic dilatation with aneurysm  formation of the ascending aorta.  The aorta approximately 5.2 cm  diameter tapering to relatively normal caliber before the take off the  innominate artery, mild global hypokinesis, and moderate septal  hypokinesis.  Post bypass transesophageal echocardiography revealed good  function of the prosthetic valve with no perivalvular leaks.  There was  mild mitral insufficiency pre and post bypass.   CLINICAL NOTE:  [name] is a 70 year old gentleman with a known  bicuspid aortic valve and a history of moderate aortic stenosis.  He  also has known enlarged ascending aorta and a history of paroxysmal  atrial fibrillation.  He presented with congestive heart failure.  He  was found to have normal  coronary arteries by catheterization, but the  aortic valve was very difficult to cross and I suspected the aortic  valve may be more stenotic and than the calculated numbers indicated.  Transesophageal echocardiography showed moderate-to-severe aortic  stenosis and 1+ mitral regurgitation.  The patient was offered aortic  valve replacement and repair of the ascending aorta.  We also discussed  maze procedure at the time of treatment of this chronic atrial  fibrillation.  He understood that was associated with 85-90% success  rate.  [name] strongly wished to proceed with aortic valve  replacement and repair of the ascending aorta.  We did discuss valve  options including mechanical and tissue valves, he strongly desired  tissue valve as well as treatment for his atrial fibrillation.   OPERATIVE NOTE:  [name] was brought to the preop holding area on Aug 07, 2007, there the anesthesia service placed on monitoring arterial  central venous and pulmonary arterial pressures.  Intravenous  antibiotics were administered.  He was taken to the operating room,  anesthetized and intubated.  Transesophageal echocardiography was  performed with findings consistent with the preoperative echocardiogram.  The chest, abdomen, and legs were prepped and draped in the usual  sterile fashion.  A median sternotomy was performed.  The patient was  fully heparinized.  Hemostasis was achieved.  The pericardium was  opened.  There was a moderate pericardial effusion.  The ascending aorta  was markedly enlarged.  There was sufficient room on the ascending aorta  to cannulate, it was cross-clamped to allow for a direct aneurysm repair  without the need for circulatory arrest.  After confirming adequate  anticoagulation with ACT measurement, the aorta was cannulated via  concentric 2 Ethilon pledgeted pursestring sutures.  A 31-French right-  angle metal tip cannula was placed via pursestring suture in the   superior vena cava.  This was difficult due to the size of the ascending  aorta.  Cardiopulmonary bypass was commenced.  A second right-angle  venous cannula, 40-French size was placed via pursestring in the  inferior aspect of the right atrium and directed to the inferior vena  cava.  Caval tapes were placed, but were only tightened during the left  atrial portion of the procedure.  Full cardiopulmonary bypass was  commenced and flows were maintained per protocol throughout the  procedure.  The heart was elevated exposing the left atrial appendage.  This was ablated with 2 ablation lines using bipolar irrigated  radiofrequency ablation.  The left atrial appendage then was excised  with a ATB45 green stapler.  The left-sided pulmonary veins then were  encircled and 2 ablation lines were placed on the left atrium just  proximal to the insertion site of the pulmonary veins, again used the  bipolar device.   A left ventricular vent was placed via purse suture in the right  superior pulmonary vein and a retrograde cardioplegic cannula was placed  via pursestring suture in the right atrium and an antegrade cardioplegic  cannula was placed in the ascending aorta.   The aorta was cross-clamped.  The patient was cooled to 28 degrees  Celsius.  The left ventricle was emptied via the vents.  Cardiac arrest  was then achieved with a combination of cold antegrade and retrograde  blood cardioplegia.  Technical difficulties were encountered with a  temperature probe.  There was a good diastolic arrest, 1 L of  cardioplegia was administered antegrade and additional liter was  administered retrograde.   The aorta was incised just above the sinotubular junction.  The aortic  valve was inspected.  The more posterior of the 2 leaflets was heavily  calcified and had moderately heavy annular calcification.  The more  anterior of the 2 leaflets had mild calcification of the valve and  annulus.  Valve  leaflets were excised.  Care was taken to remove any  entrapped calcium.  The annulus was debrided.  The annulus was copiously  irrigated with iced saline and sized for a 25 mm Paramount valve.   At this point, the caval tapes were tightened, left atrium was opened  via the intra-atrial groove.  The ablation lines around the right-sided  pulmonary veins that were completed posteriorly with a bipolar  radiofrequency device.  The monopolar device then was used to connect  the ablation lines between the sets of pulmonary veins as well as extend  the lesion to the 4 o'clock position on the mitral annulus and to  connect the pulmonary vein lesions to the lesion at the base of the left  atrial appendage.  The transesophageal echocardiogram probe was pulled  back during this portion of procedure.  After completing the ablation  lines, the atriotomy was closed in 2 layers  with a running 4-0 Prolene  suture.  First layer was a horizontal mattress followed by running  simple suture.   The aortic valve replacement then was performed, 2-0 Ethibond horizontal  mattress sutures with subannular pledgets were placed circumferentially  around the annulus.  These were then brought through the sewing ring of  the valve that was lowered into place and seated nicely and the sutures  were sequentially tied.  Completion of the placement, the annulus was  probed with a fine tip right angle.  There were no gaps.  Potential  perivalvular leaks noted.  The coronary ostia were inspected.  There was  no impingement on either the left or right coronary ostium.  Next, the  ascending aorta was excised from the level of the sinotubular junction  to approximately a centimeter below the aortic cross-clamp.  The aorta  was sized for a 38-mm Hemashield graft prior to placement of the cross-  clamp.  The 38 Hemashield graft then was anastomosed proximally with a  running 4-0 Prolene suture.  It was cut to length and the  distal  anastomosis was performed again with a running 4-0 Prolene suture.  The  patient's distal anastomoses before tying the suture, warm dose of  retrograde cardioplegia was administered.  Of note, cardioplegia was  administered at 15-minute intervals throughout the procedure due to  technical difficulties with the temperature probe.  The patient was  placed in Trendelenburg position.  Lidocaine was administered.  After  giving the warm retrograde cardioplegia, de-airing maneuvers were  performed.  The aortic cross-clamp was removed.  The total cross-clamp  time was 107 minutes.   For the patient is being rewarmed, the caval tapes were once again  tightened.  The tip of the right atrial appendage was excised and a  incision was made in the body of the right atrium incorporating the  retrograde cannulation site.  Ablation lines then were completed from  this incision along the lateral aspect of the right atrium to the  superior vena cava and inferior vena cava between the 2 incisions, then  along the medial aspect of the right atrium to the anterior most aspect  of the tricuspid annulus using bipolar radiofrequency, the monopolar  device was used to complete the isthmus lesion to the coronary sinus.   The right atriotomy sites were closed with running 4-0 Prolene sutures.  A McGoon needle was placed in the graft and secured with a 4-0 pledgeted  Prolene suture.  Once suture lines were carefully inspected for  hemostasis.  When the patient had rewarmed to a core temperature of 37  degrees Celsius, a dopamine infusion was initiated 5 mcg/kg per minute.  Epicardial pacing wires placed on the right ventricle and right atrium.  The patient was weaned from cardiopulmonary bypass on the first attempt  without difficulty.  The total bypass time was 196 minutes.  The initial  cardiac index was greater than 2 L/minute meter squared.  The patient  remained hemodynamically stable throughout the  post bypass.  Post bypass  transesophageal echocardiography revealed good function of the  prosthetic valve.  There was 1+ mitral insufficiency and there was no  change in the mild global hypokinesis and moderate septal hypokinesis.   A dose of protamine was administered, it well tolerated.  The atrial and  aortic cannulae were removed.  The remainder of protamine was  administered without incident.  Chest was irrigated with 1 L of warm  normal saline containing a  gram of vancomycin.  Hemostasis was achieved.  The pericardium was reapproximated with interrupted 2-0 silk sutures. it  came together  easily without tension.  Two mediastinal chest tubes were  placed through separate subcostal incisions.  The sternum was closed  with combination of single and double heavy gauge stainless steel wires.  Pectoralis fascia, subcutaneous tissue, and skin were closed in standard  fashion.  All sponge, needle, and instrument counts were correct.  At  the end of procedure, there were no intraoperative complications.  The  patient was taken from the operating room to the surgical intensive care  unit in fair condition.      Salvatore Decent Dorris Fetch, M.D.  Electronically Signed     SCH/MEDQ  D:  08/07/2007  T:  08/08/2007  Job:  [id number]   cc:   Madaline Savage, M.D.

## 2010-08-18 NOTE — H&P (Signed)
NAME:  Brandon, [name]                ACCOUNT NO.:  [id number]   MEDICAL RECORD NO.:  [id number]          PATIENT TYPE:  EMS   LOCATION:  MAJO                         FACILITY:  [facility]   PHYSICIAN:  [name], P.A.   DATE OF BIRTH:  03/17/1941   DATE OF ADMISSION:  09/18/2007  DATE OF DISCHARGE:                              HISTORY & PHYSICAL   CHIEF COMPLAINT:  Weakness and shortness of breath.   HISTORY OF PRESENT ILLNESS:  [name] is a 70 year old male who is  followed by [name], [name] and [name].  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  He was cathed  in April 2009.  He has had problems with PAF, he had been on Betapace at  one point, but has had problems with hypotension.  He had  catheterization in April 2009, he had minor coronary disease and severe  AS.  He underwent surgery by [name] on Aug 07, 2007.  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  He also had a modified Cox maze  procedure.  He was discharged on amiodarone.  Since discharge, he  apparently has been in and out of atrial fibrillation.  Usually it only  lasts a couple hours.  He saw [name] in the office on September 07, 2007.  He does have a residual left pleural effusion.  [name] had  recently put on Bumex for some edema and that seemed to be working.  Today, he presents to the emergency room with increasing weakness and  shortness of breath.  Apparently since discharge, he has been short of  breath, but his symptoms worsened, especially with rehab.  He can  correlate his increasing shortness of breath and weakness with his  atrial fibrillation.  He started feeling weak yesterday, he thinks he  was in atrial fibrillation yesterday and is in atrial fibrillation now  with a ventricular response of 110.  Also in the emergency room, he has  an increasing left pleural  effusion.  He has not had fever or chills or  productive cough.   PAST MEDICAL HISTORY:  1. Remarkable for bicuspid aortic valve with dilated aortic root and      PAF as noted above.  2. He has chronic left bundle branch block.  3. He had LV dysfunction, which was described as severe at      catheterization in April 2009.  A TEE done in May 2009, showed his      EF to be 40%.  4. He has had an incidental finding of cholelithiasis.  5. He is status post appendectomy and tonsillectomy remotely, as well      as C7 disk surgery in 2001.  6. He does have sleep apnea and is on CPAP.   CURRENT MEDICATIONS:  Are not totally clear, but apparently he was  discharged on:  1. Aspirin 81 mg a day.  2. Coumadin 2.5 mg or as directed.  3.  Amiodarone 200 mg a day.  4. Bumex 2 mg a day.   His wife has his list of medications and she is at work and will bring  it when she comes.  The patient does not know what medicines he takes.   ALLERGIES:  HE HAS NO KNOWN DRUG ALLERGIES.   SOCIAL HISTORY:  He is a nonsmoker, he is a retired Art gallery manager.  He is  married.  He has two children and one grandchild.   FAMILY HISTORY:  Unremarkable for coronary disease.   REVIEW OF SYSTEMS:  He denies any GI bleeding or melena.  He has not had  fever, chills or productive cough.   PHYSICAL EXAMINATION:  VITAL SIGNS:  Blood pressure manually by me was  100 systolic over 60.  His heart rate is 110.  Respirations are 12.  GENERAL:  He is a well-developed, somewhat chronically ill appearing  pale male in no acute distress.  HEENT:  Normocephalic.  He wears  glasses.  NECK:  Reveals no bruits, I could not detect any significant JVD.  CHEST:  Reveals diminished breath sounds halfway up on the left and a  third of the way up on the right with a few crackles on the right.  CARDIAC:  Reveals irregularly irregular rhythm without obvious murmur.  ABDOMEN:  Nontender.  No hepatosplenomegaly.  Bowel sounds are present.   EXTREMITIES:  Reveal 1+ pretibial pitting edema bilaterally.  NEURO:  Grossly intact.  He is awake, alert, oriented and cooperative.   His EKG shows atrial fibrillation with a left bundle branch block.   LABORATORY DATA:  White count 9.3, hemoglobin 12.4, hematocrit 37.8,  platelets 317.  Sodium 134, potassium 4.4, BUN 24, creatinine 1.1, INR  1.8.  Initial troponin in the ER is negative.  Chest x-ray shows an  increase in left pleural effusion compared with [name] x-ray  of September 07, 2007.   IMPRESSION:  1. Dyspnea, probably secondary to increasing left pleural effusion, as      well as recurrent atrial fibrillation.  2. Paroxysmal atrial fibrillation, the patient is back in atrial      fibrillation and does not tolerate this well.  3. Transient hypotension, when he is in atrial fibrillation he does      tend to be hypotensive, some of this is complicated by the fact      that the mechanical blood pressure cuffs have a hard time reading      his blood pressure in atrial fibrillation.  4. Bicuspid aortic valve with dilated aortic root, status post      Carpentier-Edwards valve with ascending aortic aneurysm Hemashield      graft and modified Cox III ablation by [name], on Aug 07, 2007.  5. Left ventricular dysfunction with an ejection fraction of 25% at      catheterization in April 2009, and 40% by transesophageal      echocardiogram on May 2009.  6. Coumadin therapy, followed by [name].  7. Chronic left bundle branch block.  8. Degenerative joint disease with remote C7 disk surgery to 2001.  9. Sleep apnea on continuous positive airway pressure.  10.Incidental cholelithiasis noted in the past.   PLAN:  The patient be admitted to telemetry.  We will add Lanoxin for  rate control as he is somewhat hypotensive.  Will give him one dose of  IV Lasix now.  Will also ask [name] to see him in consult as  he  may need to his left effusion tapped.  Will  discuss further treatment of  his atrial fibrillation with [name].      [name], P.ALenard [name]  D:  09/18/2007  T:  09/18/2007  Job:  [id number]

## 2010-08-18 NOTE — Discharge Summary (Signed)
[name], [name]                ACCOUNT NO.:  [id number]   MEDICAL RECORD NO.:  [id number]          PATIENT TYPE:  INP   LOCATION:  2020                         FACILITY:  [facility]   PHYSICIAN:  Madaline Savage, M.D.DATE OF BIRTH:  [birth year]   DATE OF ADMISSION:  09/18/2007  DATE OF DISCHARGE:  09/22/2007                               DISCHARGE SUMMARY   DISCHARGE DIAGNOSES:  1. Pleural effusion status post left-sided thoracentesis with      extraction of 1.2 L of bloody fluid.  2. Status post recent aortic valve replacement with Carpentier-Edwards      valve.  3. Paroxysmal atrial fibrillation recurrent on Coumadin      anticoagulation therapy.  4. Left ventricular dysfunction with ejection fraction of 25%.  5. Left bundle branch block.  6. Obstructive sleep apnea.   HOSPITAL COURSE:  This is a pleasant 71 year old gentleman with history  of bicuspid aortic valve who underwent valve replacement and maze  procedure on Aug 07, 2007.  He was in sinus rhythm and discontinued the  amiodarone and the patient was not a candidate for Betapace therapy.  He  could not tolerate it secondary to reduce blood pressure.  He saw Dr.  Dorris Fetch on September 07, 2007 and an x-ray revealed left-sided pleural  effusion.  Today, the patient presents with shortness of breath which is  increasing and atrial fibrillation.  Chest x-ray  revealed increased  left pleural effusion on the day of presentation and EKG showed atrial  fibrillation with increased ventricular response to 110.  We admitted  him to the hospital and asked Dr. Dorris Fetch to see the patient in  consult.  Our plan was to ask interventional radiology to proceed with  ultrasound-guided thoracentesis and Dr. Dorris Fetch agreed with this  plan.   On June 16, the patient underwent ultrasound-guided thoracentesis with  extraction of 1.2 L of bloody fluid.  His blood pressure of final post  procedure was 110/74.  The patient was stable and we  started loading him  with Coumadin.  On the day of discharge, he was stable.  INR was 1.9,  hemoglobin 12.3, hematocrit 36.6, white blood cell count 6.3, platelet  count 286.  Sodium 137, potassium 3.10, BUN 19, creatinine 1.03,  chloride 96, CO2 34, glucose 113.   The patient was in atrial fibrillation on the day of his discharge and  maximum heart rate of 94.   DISCHARGE MEDICATIONS:  He is going to be discharged home on the  following medications.  1. Amiodarone 200 mg b.i.d.  2. Lanoxin 0.125 mg daily.  3. Coumadin 7.5 mg today and the next consecutive days and then the      patient will present to [facility]madin Clinic for INR checkup.  4. Protonix 40 mg daily.  5. Lasix 20 mg daily.  6. Aspirin 81 mg daily.   The patient was allowed to start his p.r.n. medications as before.   DISCHARGE FOLLOWUP:  He will see Dr. Elsie Lincoln on July 2 at 4:30 p.m. and  appointment for Protime and INR checkup is scheduled in our Coumadin  Clinic on July 22 at 8:40 a.m.      Raymon Mutton, P.A.    ______________________________  Madaline Savage, M.D.    MK/MEDQ  D:  09/22/2007  T:  09/22/2007  Job:  [id number]

## 2010-08-18 NOTE — Assessment & Plan Note (Signed)
OFFICE VISIT   [name], [name]  DOB:  30-Dec-1940                                        November 06, 2007  CHART #:  [id number]   HISTORY OF PRESENT ILLNESS:  The patient is status post aortic valve  replacement using 25-mm Carpentier-Edwards Perimount valve with  placement of ascending aortic aneurysm with 30 mm Hemashield graft to  modify Cox-Maze III atrial ablation.  This was done by Dr. Dorris Fetch  on Aug 07, 2007.  The patient was seen in June for his followup visit.  At this time, he was noted to have a significant left pleural effusion.  At this time, his diuretics were increased and plans to followup to see  if improved on its own or would require a thoracentesis.  The patient  states that he had started cardiac rehab and during his second visit,  developed a significant shortness of breath with exertion and was taken  down to the emergency room.  Chest x-ray done and showed increased size  in his left pneumothorax.  The patient was admitted under Cardiology and  underwent left thoracentesis.  The patient states 1.2 liters was drained  from his left side.  He was discharged home the next day.  The patient  states since then he has been feeling so much better.  He is ambulating  30 minutes per day without difficulty and states no more shortness of  breath.  He is not back at cardiac rehab, because he is doing it on his  own.  Dr. Elsie Lincoln has continued to manage the patient's Coumadin.  Last  INR was 2.4.  The patient is tolerating diet well.  Denies any chest  pain, shortness of breath, and opening or drainage from any of his  incision sites.   PHYSICAL EXAMINATION:  VITAL SIGNS:  Blood pressure 151/80, pulse of 60,  respirations of 18, and O2 sats 94% on room air.  RESPIRATORY:  Clear to auscultation bilaterally.  CARDIAC:  Regular rate and rhythm.  S1 and S2 noted.  No murmur noted.  ABDOMEN:  Benign.  EXTREMITIES:  No edema noted.  GENERAL:  All  incisions are clean, dry, and intact and continue to heal  well.   STUDIES:  The patient had PA and lateral chest x-ray done today on  November 06, 2006, which shows to be stable.  There is no reaccumulation of  pleural effusion.  No pneumothorax or atelectasis noted.   IMPRESSION AND PLAN:  The patient is continuing to progress well  postoperatively.  He is doing much better post thoracentesis.  The  patient is instructed to continue ambulating as tolerated.  He is told  that he is allowed to lift over 10 pounds, but increase gradually.  He  is to continue seeing Dr. Elsie Lincoln as directed.  I discussed to follow up  with Dr. Dorris Fetch in our office.  We will contact the patient to  arrange a followup visit in 6 months with chest x-ray.  The patient is  instructed to contact us, if he develops or has any surgical issues.  The patient is in agreement.   Salvatore Decent Dorris Fetch, M.D.  Electronically Signed   KMD/MEDQ  D:  11/06/2007  T:  11/06/2007  Job:  [id number]   cc:   Madaline Savage, M.D.  Salvatore Decent Dorris Fetch,  M.D. 

## 2010-08-18 NOTE — Cardiovascular Report (Signed)
NAMEELON, EOFF                ACCOUNT NO.:  [id number]   MEDICAL RECORD NO.:  [id number]          PATIENT TYPE:  INP   LOCATION:  2315                         FACILITY:  [facility]   PHYSICIAN:  Darlin Priestly, MD  DATE OF BIRTH:  1940-06-20   DATE OF PROCEDURE:  07/31/2007  DATE OF DISCHARGE:                            CARDIAC CATHETERIZATION   PROCEDURES:  1. Right heart catheterization.  2. Left heart catheterization.  3. Coronary angiography.  4. Left ventriculogram.  5. Ascending aortography.   ATTENDING SURGEON:  Darlin Priestly, MD   COMPLICATIONS:  None.   INDICATIONS:  Mr. Bennis is a 70 year old male, patient of Dr. Lavonne Chick with a history of paroxysmal atrial fibrillation, history of  aortic stenosis with aortic valve area of 1.2 and a mean gradient of 23,  in December 2008.  He was readmitted on July 28, 2007, with increasing  shortness of breath, orthopnea, and PND.  He is now brought for a  cardiac catheterization to assess his coronary anatomy as well as left  ventricular function and aortic valve.   DESCRIPTION OF PROCEDURE:  After informed consent, the patient was  brought to cardiac cath lab where right groin was shaved, prepped, and  draped in sterile fashion.  ECG monitor was established.  Using modified  Seldinger technique, #7 French venous sheath introduced into the femoral  vein, #6 French arterial sheath into right femoral artery.  Next, under  fluoroscopic guidance, a #7 [place] Swan-Ganz catheter was floated to the  RA, RV, PA, in wedge position, and hemodynamic measures were obtained.   A 6-French diagnostic catheter was used to perform diagnostic  angiography.   Left main is a large vessel with no significant disease.   LAD is a large vessel which courses __________  with 4 diagonal  branches.  LAD has no significant disease.   First, second, and third diagonals were all small-to-medium sized  vessels with no significant disease.   The fourth diagonal is a medium-to-large vessel with no significant  disease.   Left circumflex is a medium-sized vessel coursed to AV groove against  one obtuse marginal branch.  AV groove circumflex has no significant  disease.   First OM is a medium-sized vessel which bifurcates distally with no  significant disease.   Right coronary is a large vessel, which is dominant, gives rise to PDA  and posterolateral branch.  There is no significant disease in the  RCA,  PDA, or posterolateral branch.   Left ventricle reveals a severely depressed EF of 20%.  The patient was  noted to have severe global hypokinesis with mild mitral regurge.   Ascending aortography reveals moderate-to-marked aortic root dilatation  with mild aortic insufficiency.   It should be noted that we did cross the aortic valve, there was  considerable amount of difficulty crossing.  We were unable to cross the  J-wire.  Ultimately, we were able to cross with a straight wire and a  right coronary catheter, which was ultimately exchanged for a pigtail  over exchange length J-wire.   HEMODYNAMICS:  Right artery  13, RV 40/10, PA 50/20, pulmonary capillary  pressure is 25, systemic arterial pressure 94/67, LV system pressure  126/6, LVEDP of 20, cardiac output 3.7, cardiac index 1.8, PA saturation  59%, aortic saturation 94%, aortic valve area of 1.1 sq cm, atrial valve  mean gradient 30 mmHg.   CONCLUSION:  1. Noncritical coronary artery disease.  2. Severely depressed left ventricular systolic function.  3. Mild mitral regurgitation.  4. Moderate-to-marked aortic root dilatation.  5. History of mild aortic insufficiency.  6. Elevated pulmonary capillary wedge pressure with moderate pulmonary      hypertension.  7. Cardiac output 3.7, cardiac index 1.8.  8. Pulmonary artery saturation of 39%, aortic saturation 94%.  9. Aortic valve area of 1.1 sq cm and aortic valve mean gradient 32      mmHg.        Darlin Priestly, MD  Electronically Signed     RHM/MEDQ  D:  07/31/2007  T:  08/01/2007  Job:  [id number]   cc:   Madaline Savage, M.D.

## 2010-08-19 ENCOUNTER — Telehealth: Payer: Self-pay | Admitting: Internal Medicine

## 2010-08-19 NOTE — Telephone Encounter (Signed)
Pradaxa needs authorization from your insurance company.  [facility]ed this to [place] on 5-13 regarding same. Pt has 3 days of meds left and is concerned that this will not be done in time. Is there any samples he can get until this gets resolved.  Please call patient back regarding same.

## 2010-08-19 NOTE — Telephone Encounter (Signed)
Pt aware samples have been placed at the front desk of pradaxa Pradaxa 150mg   Lot: [id number] A Exp: 10/12 # 24. I will have to contact [facility] when I am back in the office next week and start the PA. He is agreeable.

## 2010-08-21 NOTE — Assessment & Plan Note (Signed)
OFFICE VISIT   [name], [name]  DOB:  [birth year]                                        Aug 07, 2009  CHART #:  [id number]   HISTORY:  The patient is a 70 year old gentleman.  He had aortic valve  replacement, repair of an ascending aortic aneurysm, and a Cox maze  procedure for chronic atrial fibrillation in 2009.  He returned last May  for 1-year followup, at which time he was in sinus rhythm.  He states  that since his last visit, his only major medical issue was he was  hospitalized in [place] with acute cholecystitis and had to have a  cholecystectomy.  Apparently, he was in the hospital quite a period of  time coming off the Coumadin and going back on the Coumadin.  He is not  aware of any cardiac issues or arrhythmias during that event.  He did  say that last summer he had had some what he felt like irregular heart  rapid rhythms.  He has now changed cardiologist and is with Dr. Olga Millers, who is very happy with.   CURRENT MEDICATIONS:  1. Coreg 12.5 mg daily.  2. Lisinopril/hydrochlorothiazide 20/12.5 one tablet daily.  3. Diltiazem 120 mg daily.  4. Coumadin 5 mg daily except Wednesdays when he takes 2.5 mg.  5. Klor-Con 20 mEq daily.  6. Digoxin 0.125 mg tablets, he takes 1/2 tablet daily.   ALLERGIES:  He has known drug allergies.   PHYSICAL EXAMINATION:  His blood pressure is 144/86, pulse 68 and  regular, respirations are 18, and oxygen saturation is 94% on room air.  His cardiac exam has a regular rate and rhythm.  There is a normal S1  and S2.  There is no murmur.  His sternal incision is well healed.   DIAGNOSTIC TESTS:  A rhythm strip was done in the office shows sinus  rhythm with a rate of 68.   IMPRESSION:  The patient is doing well at this point in time.  He is now  2 years out from aortic valve replacement and maze procedure.  He is  maintaining sinus rhythm currently on a beta-blocker and calcium blocker  as well as very  low dose of digoxin.  He does remain on Coumadin.  He  has not had any issues regarding that other than having to come off and  go back on around the time of his gallbladder surgery a year ago.  We  did not make any medication changes today.  We will defer to Dr.  Jens Som in that regard as he is following now consistently with Dr.  Jens Som.  I have released him to return on a p.r.n. basis.  I will be  more than happy to see him at any time in the future if I can be of any  further assistance with his care.   Salvatore Decent Dorris Fetch, M.D.  Electronically Signed   SCH/MEDQ  D:  08/07/2009  T:  08/07/2009  Job:  [id number]   cc:   Madolyn Frieze. Jens Som, MD, [facility]  Gaspar Garbe, M.D.

## 2010-08-21 NOTE — Op Note (Signed)
[place]. [facility]  Patient:    [name], [name]                       MRN: [id number] Proc. Date: 04/13/00 Adm. Date:  [id number] Attending:  Marlowe Shores CC:         Artist Pais Mina Marble, M.D. (2) [id number].   Operative Report  PREOPERATIVE DIAGNOSIS:  Right carpal tunnel syndrome.  POSTOPERATIVE DIAGNOSIS:  Right carpal tunnel syndrome.  PROCEDURE:  Right carpal tunnel release.  SURGICAL HISTORY:  Artist Pais. Mina Marble, M.D.  ASSISTANT:  R.N.  ANESTHESIOLOGIST:  Bier block.  TOURNIQUET TIME:  30 minutes.  COMPLICATIONS:  None.  DRAINS:  None.  DESCRIPTION OF PROCEDURE:  The patient was taken to the operating room after induction of ___________ Bier block analgesic.  The right upper extremity was prepped and draped in the usual sterile fashion.  Once this was done a 2 cm incision was made in the palmar aspect of the right hand in line with the ___________ of the ring finger in the area of the thenar crease.  The incision was taken down through the skin and subcutaneous tissues until the palmar fascia was identified.  The palmar fascia was split longitudinally, this exposing the transverse carpal ligament.  The transverse carpal ligament was divided with a 15 blade.  This exposed and underlying median nerve in ___________ with the carpal canal.  Using Ragnell retractor, the distal-most and proximal aspects of the transverse carpal ligament were identified and divided under direct vision.  The carpal canal was inspected.  There were no obvious ganglions or osseous lesions were present.  The wound was then thoroughly irrigated and closed with a running 3-0 Prolene subcuticular stitch.  Steri-Strips, 4x4, and compressive dressing was applied.  The patient tolerated the procedure well and went to recovery room in stable fashion. DD:  04/13/00 TD:  04/13/00 Job: [id number]

## 2010-08-21 NOTE — Discharge Summary (Signed)
NAMEANURAG, SCARFO                ACCOUNT NO.:  [id number]   MEDICAL RECORD NO.:  [id number]          PATIENT TYPE:  INP   LOCATION:  2034                         FACILITY:  [facility]   PHYSICIAN:  Raymon Mutton, P.A. DATE OF BIRTH:  08/22/1940   DATE OF ADMISSION:  03/14/2007  DATE OF DISCHARGE:  03/16/2007                               DISCHARGE SUMMARY   HISTORY OF PRESENT ILLNESS:  Mr. Kyllo is a 70 year old gentleman  presented to the emergency room with complaints of irregular rapid heart  rate and chest pain.  The patient took some aspirin with resolution of  pain, but the symptoms were recurrent.  He called our office and  instructed to go to the emergency room.  Upon arrival to the ER, the EKG  was performed and it revealed atrial fibrillation with rapid ventricular  response.  The patient has never had a history of atrial fibrillation in  the past, and he was unaware of how long it lasted, apparently, greater  than 48 hours.  We started him on anticoagulation therapy.  Dr. Clarene Duke  saw the patient and decided to continue with Coumadin therapy for 3-4  weeks and then proceed with cardioversion.  During this hospitalization,  the patient was seen with social worker, and Lovenox prescription was  arranged for him to take injections at home.   DISCHARGE MEDICATIONS:  The patient was discharge home on the following  medications:  1. Cardura  4 mg q.h.s.  2. Avapro 150 mg daily.  3. Coumadin 5 mg daily.  4. Diltiazem 120 mg q.a.c.  5. HCTZ 12.5 mg daily.   DISCHARGE DIAGNOSIS:  1. New onset atrial fibrillation, duration unknown, greater than 48      hours.  Plan to stay on anticoagulation therapy with Coumadin and      then planned for cardioversion.  2. Hypertension.  3. Aortic stenosis.  4. Rheumatic fever as a child.  5. History of C7 fracture effusion by Dr. Newell Coral.   DISCHARGE FOLLOWUP:  Dr. Elsie Lincoln to contact the patient to schedule  follow-up appointment, and the  patient was instructed to return to the  office on December 13 for protime INR checks.      Raymon Mutton, P.A.     MK/MEDQ  D:  04/25/2007  T:  04/26/2007  Job:  [id number]   cc:   Madaline Savage, M.D.  [facility]

## 2010-08-21 NOTE — Op Note (Signed)
[place]. [facility]  Patient:    [name], [name]                       MRN: [id number] Proc. Date: 08/10/99 Adm. Date:  [id number] Disc. Date: [id number] Attending:  Barton Fanny                           Operative Report  PREOPERATIVE DIAGNOSIS:  Cervical spondylosis, degenerative disc disease and radiculopathy.  POSTOPERATIVE DIAGNOSIS:  Cervical spondylosis, degenerative disc disease and radiculopathy.  OPERATION: C6-7 anterior cervical diskectomy and arthrodesis with iliac crest Allograft and A line cervical plating.  SURGEON:  Hewitt Shorts, M.D.  ANESTHESIA:  General endotracheal.  INDICATION FOR PROCEDURE:  The patient is a 70 year old man who presented with a right cervical radiculopathy who was found by MRI, myelogram, and post myelogram CT scan as well as x-ray to have multiple level degenerative disc disease and spondylosis.  However, there was particular foraminal encroachment on the right side at C6-7 where as at the C4-5 and C5-6 levels, the more foraminal encroachment was off to the left where he was asymptomatic.  It was therefore elected to proceed with a single level anterior cervical diskectomy and arthrodesis at the level where the foraminal encroachment most closely corresponded to his symptoms.  DESCRIPTION OF PROCEDURE:  The patient was brought to the operating room and was placed under general endotracheal anesthesia.  The patient was placed in 10 pounds of halter traction and the neck was prepped with Betadine soap and solution and draped in a sterile fashion. A horizontal incision was made on the left side of the neck.  The line of incision was infiltrated with local anesthetic with epinephrine and an incision was made carried down through the subcutaneous tissue and platysma. Dissection was then carried out through an avascular plane leaving the sternocleidomastoid, carotid artery, jugular vein laterally and  tracheal and esophagus medially.  The ventral aspect of the vertebral column was identified and localizing x-ray taken.  The C6-7 intervertebral disc space identified. Bipolar cautery was used throughout the case to maintain hemostasis.  The annulus of the C6-7 disc was incised and the disc space entered and degenerated disc material removed using micro curets and pituitary rongeurs.  The cartilaginous end plates of C6 and C7 were removed using micro curets and the Midas Rex drill with an A2 bur.  Then there was substantial osteophytic and spondylitic overgrowth that was removed using the Midas Rex drill including posteriorly across the ventral aspect of the spinal canal as well as laterally within the foramen.  Once the spondylitic overgrowth was thinned a 2 mm Kerrison punch with a thin foot plate was used to remove the remaining thin axilla bone.  The thickened ligament tissue was removed as well and in the end good decompression of the thecal sac and of the foraminal and nerve root bilaterally was achieved.  There was mild epidural bleeding that was controlled with Gelfoam soaked in thrombin. Once hemostasis was established and the decompression completed, we proceeded with the arthrodesis.  We selected a wedge of iliac crest Allograft and cut and shaped it using an oscillating saw. It was positioned in the intervertebral disc space and counter sunk.  We then removed anterior osteophytic overgrowth and selected a 26 mm A line cervical plate. It was positioned over the fusion construct and secured to each of the vertebrae with a pair  of screws. Each screw hole was drilled and taped and a 4 x 14 mm screw placed accepted on the left side at C7 where a 4.35 x 14 mm screw was placed.  The wound was irrigated with Bacitracin solution.  Checked for hemostasis which was established and confirmed and then we proceeded with closure.  The platysma was closed with interrupted and inverted 2-0 undyed  Vicryl suture and the subcutaneous and subcuticular interrupted inverted 3-0 undyed Vicryl sutures and the skin was reapproximated with Dermabond.  No x-ray was taken at the end of the procedure because, the initial film had not visualized the actual vertebral bodies of C6 and C7 and therefore it was felt that another x-ray would not be of any use.  However, under direct vision the fusion construct appeared good.  Following closure, the patient was taken out of traction, placed in a soft collar.  Reversed from anesthetic, extubated and transferred to the recovery room for further care. DD:  08/10/99 TD:  08/12/99 Job: [id number]

## 2010-08-21 NOTE — H&P (Signed)
[name]. [facility]  Patient:    [name]                       MRN: [id number] Adm. Date:  [id number] Disc. Date: [id number] Attending:  Barton Fanny CC:         Hewitt Shorts, M.D.                         History and Physical  CHIEF COMPLAINT: This patient is a 70 year old right-handed white male who is evaluated for right cervical radiculopathy with pain and numbness of the right arm and forearm.  HISTORY OF PRESENT ILLNESS: He has had several episodes over the past year, the current episode not easing.  He was given a number of medications including prednisone dospak, pain pills, and muscle relaxants, with pain, numbness, and tingling all through the right upper extremity.  He complains of pain in the right arm and forearm, numbness and tingling in the right forearm, and sense of weakness in the right upper extremity.  He has a bit of soreness in the intrascapular region but no significant posterior neck pain.  He was evaluated with MRI scan and x-rays and the MRI scan confirms degenerative disk disease and spondylosis noted at C4-5, C5-6 and C6-7, and it was suspected that he had root compression of the right side at C6-7. However, to evaluate this further a myelogram and post myelogram CT scan was performed, which confirmed degenerative disk disease and spondylosis at C4-5, C5-6, and C6-7, with particular nerve root impingement on the right side at C6-7 corresponding to his right C7 cervical radiculopathy.  He did have some encroachment on the left side at C4-5 and C5-6 but no left cervical radicular symptoms.  The patient is therefore admitted now for single level C6-7 anterior cervical diskectomy and arthrodesis with bone graft and plating. PAST MEDICAL HISTORY: No history of hypertension, myocardial infarction, cancer, stroke, diabetes, peptic ulcer disease, or lung disease.  PAST SURGICAL HISTORY:  1. Appendectomy as a  child.  2. Tonsillectomy as a child.  ALLERGIES: No known drug allergies.  CURRENT MEDICATIONS: None.  FAMILY HISTORY: His mother passed away at age 16 from congestive heart failure.  His father died at age 36 and had emphysema.  SOCIAL HISTORY: The patient is married.  He works as an Veterinary surgeon.  He does not smoke.  He does drink alcoholic beverages socially. He denies history of substance abuse.  REVIEW OF SYSTEMS: Notable for those difficulties described in the History of Present Illness and Past Medical History, otherwise unremarkable.  PHYSICAL EXAMINATION:  GENERAL: The patient is a well-developed, well-nourished white male in no acute distress.  VITAL SIGNS: Height 5 feet 10 inches.  Weight 210 pounds.  Afebrile.  LUNGS: Clear to auscultation.  He has symmetrical respiratory excursion.  HEART: Regular rate and rhythm, normal S1 and S2.  No murmur.  ABDOMEN: Soft, nondistended.  Bowel sounds present.  EXTREMITIES: No clubbing, cyanosis, or edema.  MUSCULOSKELETAL: No tenderness to palpation over the cervical spinous process or paracervical musculature.  He has good range of motion of the neck without significant discomfort on range of motion of the neck.  There is no discomfort on testing for impingement in the shoulders.  NEUROLOGIC: Shows weakness of the right triceps, 4-4+/5, and remainder of upper extremity strength 5/5 bilaterally including the deltoid, biceps, as well as left triceps as well as intrinsics  and grip bilaterally.  Sensory examination shows intact sensation to pinprick in the upper extremities.  He does say that pinprick is somewhat less intense throughout the right hand as compared to the left hand but there is no clear-cut dermatome pattern of sensory deficit.  Reflexes are 1 at the biceps and brachial radialis bilaterally as well as the left triceps.  However, the right triceps is trace. Quadriceps and gastrocnemius are 1  bilaterally.  Toes are downgoing bilaterally.  He has normal gait and stance.  IMPRESSION: Right C7 cervical radiculopathy secondary to spondylosis and degenerative disk disease encroaching upon the right C7 nerve root, with more mild degenerative changes seen at the C4-5 and C5-6 level and more significantly at those levels off to the left side.  PLAN: The patient is admitted for C6-7 anterior cervical diskectomy and arthrodesis, with iliac crest allograft and cervical plating.  We discussed the nature of the surgery including hospital stay and recuperation, his limitations during the postoperative period, the need for postoperative immobilization in a soft cervical collar, and discussed the risks of surgery including the risk of infection, bleeding, possible need for transfusion, the risk of nerve root dysfunction with pain, weakness, numbness, or paresthesias, the risk of spinal cord dysfunction with paralysis of all four limbs and quadriplegia, and the anesthetic risks of myocardial infarction, stroke, pneumonia, and death.  We discussed alternatives to surgical procedure as well as alternatives to surgery, and after discussing all this the patient does wish to proceed with the surgery as planned and is admitted for such.  DD: 08/10/99 TD:  08/10/99 Job: [id number]

## 2010-08-24 NOTE — Telephone Encounter (Signed)
I contacted the pt's insurance at [phone] and have received authorization for his Pradaxa. [facility] at [facility] [address] [facility] and have notified the pt.

## 2010-09-02 ENCOUNTER — Other Ambulatory Visit: Payer: Self-pay | Admitting: *Deleted

## 2010-09-02 ENCOUNTER — Encounter: Payer: MEDICARE | Admitting: *Deleted

## 2010-10-02 ENCOUNTER — Other Ambulatory Visit: Payer: Self-pay | Admitting: Cardiovascular Disease

## 2010-10-04 ENCOUNTER — Other Ambulatory Visit: Payer: Self-pay | Admitting: Cardiology

## 2010-11-05 ENCOUNTER — Ambulatory Visit (INDEPENDENT_AMBULATORY_CARE_PROVIDER_SITE_OTHER): Payer: Medicare Other | Admitting: *Deleted

## 2010-11-05 ENCOUNTER — Encounter: Payer: Self-pay | Admitting: Internal Medicine

## 2010-11-05 ENCOUNTER — Other Ambulatory Visit: Payer: Self-pay | Admitting: Internal Medicine

## 2010-11-05 DIAGNOSIS — Z95810 Presence of automatic (implantable) cardiac defibrillator: Secondary | ICD-10-CM

## 2010-11-05 DIAGNOSIS — I428 Other cardiomyopathies: Secondary | ICD-10-CM

## 2010-11-05 DIAGNOSIS — I4891 Unspecified atrial fibrillation: Secondary | ICD-10-CM

## 2010-11-05 LAB — REMOTE ICD DEVICE
ATRIAL PACING ICD: 99 pct
FVT: 0
TOT-0006: 20120501000000
TZAT-0001FASTVT: 2
TZAT-0001SLOWVT: 1
TZAT-0002FASTVT: NEGATIVE
TZAT-0013FASTVT: 2
TZAT-0013SLOWVT: 2
TZAT-0018FASTVT: NEGATIVE
TZAT-0018FASTVT: NEGATIVE
TZAT-0018SLOWVT: NEGATIVE
TZON-0003SLOWVT: 333.3 ms
TZST-0001FASTVT: 3
TZST-0001FASTVT: 6
TZST-0001FASTVT: 7
TZST-0001SLOWVT: 5
TZST-0001SLOWVT: 6
TZST-0003FASTVT: 41 J
TZST-0003FASTVT: 41 J
TZST-0003SLOWVT: 17 J
TZST-0003SLOWVT: 41 J
VENTRICULAR PACING ICD: 98 pct
VF: 0

## 2010-11-06 ENCOUNTER — Telehealth: Payer: Self-pay | Admitting: Cardiology

## 2010-11-06 NOTE — Telephone Encounter (Signed)
Pt calling wanting to know what he can take for sinus drainage that won't effect other meds he is taking--advised to take any decongestant that does not have antihystamine or "D" in the make up--pt agrees--nt

## 2010-11-06 NOTE — Telephone Encounter (Signed)
Per pt call pt is having a severe sinus attack, started about one week ago but is getting progressively worse. Pt calling to see what medicine he can take, OTC or RX. Pt is c/o coughing, nasal drainage into throat forcing pt to sleep in recliner. Please return pt call to advise/discuss.

## 2010-11-11 NOTE — Progress Notes (Signed)
icd remote check  

## 2010-11-25 ENCOUNTER — Encounter: Payer: Self-pay | Admitting: *Deleted

## 2010-12-29 LAB — COMPREHENSIVE METABOLIC PANEL
ALT: 25
Alkaline Phosphatase: 52
Alkaline Phosphatase: 55
BUN: 20
CO2: 29
CO2: 29
Chloride: 99
Creatinine, Ser: 0.98
GFR calc non Af Amer: 59 — ABNORMAL LOW
GFR calc non Af Amer: >60
Glucose, Bld: 156 — ABNORMAL HIGH
Glucose, Bld: 164 — ABNORMAL HIGH
Potassium: 4
Potassium: 4.5
Sodium: 138
Total Bilirubin: 3 — ABNORMAL HIGH

## 2010-12-29 LAB — POCT I-STAT 3, VENOUS BLOOD GAS (G3P V)
O2 Saturation: 59
pO2, Ven: 33

## 2010-12-29 LAB — CBC
HCT: 43.4
HCT: 46.8
Hemoglobin: 13.6
Hemoglobin: 14.1
Hemoglobin: 15.7
Hemoglobin: 15.9
MCHC: 33.7
MCHC: 34.1
MCHC: 34.5
MCHC: 34.5
MCV: 88.5
MCV: 88.6
MCV: 89.1
Platelets: 171
Platelets: 180
Platelets: 203
RBC: 4.68
RBC: 5.09
RBC: 5.29
RDW: 12.9
RDW: 12.9
RDW: 13.1
RDW: 13.1
WBC: 8.3
WBC: 8.9
WBC: 9.4

## 2010-12-29 LAB — POCT I-STAT 3, ART BLOOD GAS (G3+)
Acid-Base Excess: 1
Acid-Base Excess: 2
Acid-Base Excess: 2
Bicarbonate: 29 — ABNORMAL HIGH
O2 Saturation: 79
O2 Saturation: 94
Patient temperature: 97.6
Patient temperature: 97.7
TCO2: 31
TCO2: 31
pH, Arterial: 7.323 — ABNORMAL LOW
pO2, Arterial: 70 — ABNORMAL LOW

## 2010-12-29 LAB — BASIC METABOLIC PANEL
BUN: 23
BUN: 23
CO2: 28
Calcium: 8.9
Calcium: 9
Calcium: 9.2
Chloride: 100
Chloride: 101
Creatinine, Ser: 0.87
Creatinine, Ser: 0.93
Creatinine, Ser: 1.07
Creatinine, Ser: 1.09
GFR calc Af Amer: >60
GFR calc Af Amer: >60
GFR calc non Af Amer: >60
GFR calc non Af Amer: >60
Glucose, Bld: 110 — ABNORMAL HIGH
Sodium: 138
Sodium: 138

## 2010-12-29 LAB — DIFFERENTIAL
Basophils Absolute: 0
Basophils Relative: 0
Eosinophils Absolute: 0.1
Eosinophils Relative: 1
Monocytes Absolute: 0.5
Monocytes Relative: 4
Neutro Abs: 9.5 — ABNORMAL HIGH

## 2010-12-29 LAB — POCT CARDIAC MARKERS
CKMB, poc: <1 — ABNORMAL LOW
Myoglobin, poc: 62.1
Operator id: 282201
Troponin i, poc: <0.05

## 2010-12-29 LAB — POCT I-STAT, CHEM 8
BUN: 21
Calcium, Ion: 1.14
Chloride: 99
HCT: 49
Potassium: 4.3
Sodium: 135

## 2010-12-29 LAB — PROTIME-INR
INR: 1.2
INR: 1.5
INR: 2.1 — ABNORMAL HIGH
Prothrombin Time: 15.9 — ABNORMAL HIGH
Prothrombin Time: 18.4 — ABNORMAL HIGH
Prothrombin Time: 23.9 — ABNORMAL HIGH

## 2010-12-29 LAB — CARDIAC PANEL(CRET KIN+CKTOT+MB+TROPI)
CK, MB: 1.1
CK, MB: 1.2
CK, MB: 1.2
CK, MB: 1.4
Relative Index: INVALID
Total CK: 28
Troponin I: 0.01
Troponin I: 0.01
Troponin I: 0.02
Troponin I: 0.02
Troponin I: 0.02

## 2010-12-29 LAB — APTT
aPTT: 30
aPTT: 31

## 2010-12-29 LAB — B-NATRIURETIC PEPTIDE (CONVERTED LAB)
Pro B Natriuretic peptide (BNP): 455 — ABNORMAL HIGH
Pro B Natriuretic peptide (BNP): 484 — ABNORMAL HIGH
Pro B Natriuretic peptide (BNP): 589 — ABNORMAL HIGH
Pro B Natriuretic peptide (BNP): 790 — ABNORMAL HIGH

## 2010-12-29 LAB — MAGNESIUM: Magnesium: 2.4

## 2010-12-30 LAB — PROTIME-INR
INR: 1.4
INR: 1.5
INR: 1.5
INR: 1.6 — ABNORMAL HIGH
INR: 2.3 — ABNORMAL HIGH
INR: 2 — ABNORMAL HIGH
Prothrombin Time: 16.9 — ABNORMAL HIGH
Prothrombin Time: 18.1 — ABNORMAL HIGH
Prothrombin Time: 18.7 — ABNORMAL HIGH
Prothrombin Time: 19.1 — ABNORMAL HIGH
Prothrombin Time: 22.1 — ABNORMAL HIGH
Prothrombin Time: 26 — ABNORMAL HIGH
Prothrombin Time: 22.9 — ABNORMAL HIGH

## 2010-12-30 LAB — CROSSMATCH
ABO/RH(D): O POS
Antibody Screen: NEGATIVE

## 2010-12-30 LAB — BLOOD GAS, ARTERIAL
Bicarbonate: 26.8 — ABNORMAL HIGH
FIO2: 0.21
O2 Saturation: 98.4
Patient temperature: 98.6
pH, Arterial: 7.431

## 2010-12-30 LAB — BASIC METABOLIC PANEL
BUN: 14
BUN: 15
BUN: 17
BUN: 18
CO2: 29
CO2: 30
Calcium: 7.8 — ABNORMAL LOW
Calcium: 8.3 — ABNORMAL LOW
Calcium: 8.4
Calcium: 8.6
Calcium: 8.6
Calcium: 8.8
Calcium: 8.9
Calcium: 9.1
Chloride: 101
Chloride: 95 — ABNORMAL LOW
Chloride: 97
Chloride: 99
Chloride: 99
Creatinine, Ser: 0.97
Creatinine, Ser: 0.99
Creatinine, Ser: 1.06
Creatinine, Ser: 1.1
Creatinine, Ser: 1.19
Creatinine, Ser: 1.3
GFR calc Af Amer: >60
GFR calc Af Amer: >60
GFR calc Af Amer: >60
GFR calc Af Amer: >60
GFR calc Af Amer: >60
GFR calc Af Amer: >60
GFR calc Af Amer: >60
GFR calc Af Amer: >60
GFR calc Af Amer: >60
GFR calc Af Amer: >60
GFR calc non Af Amer: >60
GFR calc non Af Amer: >60
GFR calc non Af Amer: >60
GFR calc non Af Amer: >60
GFR calc non Af Amer: >60
GFR calc non Af Amer: >60
GFR calc non Af Amer: >60
GFR calc non Af Amer: >60
GFR calc non Af Amer: >60
GFR calc non Af Amer: >60
Glucose, Bld: 102 — ABNORMAL HIGH
Glucose, Bld: 116 — ABNORMAL HIGH
Glucose, Bld: 123 — ABNORMAL HIGH
Glucose, Bld: 90
Potassium: 3.8
Potassium: 4.3
Potassium: 4.3
Potassium: 4.4
Sodium: 131 — ABNORMAL LOW
Sodium: 135
Sodium: 135
Sodium: 136
Sodium: 138
Sodium: 138

## 2010-12-30 LAB — POCT I-STAT 3, ART BLOOD GAS (G3+)
Acid-base deficit: 3 — ABNORMAL HIGH
Bicarbonate: 23.6
Bicarbonate: 27.6 — ABNORMAL HIGH
O2 Saturation: 93
O2 Saturation: 97
Operator id: 252761
Operator id: 260261
Operator id: 3406
Patient temperature: 37.1
TCO2: 25
TCO2: 25
TCO2: 26
pCO2 arterial: 36.8
pCO2 arterial: 38.4
pCO2 arterial: 44.6
pH, Arterial: 7.399
pH, Arterial: 7.41
pH, Arterial: 7.418
pH, Arterial: 7.423
pO2, Arterial: 105 — ABNORMAL HIGH
pO2, Arterial: 265 — ABNORMAL HIGH

## 2010-12-30 LAB — CBC
HCT: 27.2 — ABNORMAL LOW
HCT: 43.2
Hemoglobin: 12.4 — ABNORMAL LOW
Hemoglobin: 14.6
Hemoglobin: 9.4 — ABNORMAL LOW
Hemoglobin: 9.6 — ABNORMAL LOW
MCHC: 34
MCHC: 35.3
MCV: 88
MCV: 88.5
Platelets: 155
Platelets: 204
Platelets: 211
Platelets: 302
RBC: 3.06 — ABNORMAL LOW
RBC: 3.43 — ABNORMAL LOW
RBC: 3.57 — ABNORMAL LOW
RBC: 4.01 — ABNORMAL LOW
RBC: 4.59
RBC: 4.68
RDW: 13
RDW: 13.1
RDW: 13.4
RDW: 14.3
WBC: 14.1 — ABNORMAL HIGH
WBC: 14.8 — ABNORMAL HIGH
WBC: 16.5 — ABNORMAL HIGH
WBC: 17.9 — ABNORMAL HIGH
WBC: 24.3 — ABNORMAL HIGH
WBC: 7.3
WBC: 7.5
WBC: 8
WBC: 8.1
WBC: 10

## 2010-12-30 LAB — POCT I-STAT, CHEM 8
BUN: 20
Calcium, Ion: 1.18
Chloride: 103
Creatinine, Ser: 1.1
Creatinine, Ser: 1.1
HCT: 32 — ABNORMAL LOW
HCT: 34 — ABNORMAL LOW
HCT: 35 — ABNORMAL LOW
Hemoglobin: 10.9 — ABNORMAL LOW
Hemoglobin: 11.6 — ABNORMAL LOW
Potassium: 4.3
Potassium: 5.1
Sodium: 138
Sodium: 140
Sodium: 137
TCO2: 25
TCO2: 28

## 2010-12-30 LAB — B-NATRIURETIC PEPTIDE (CONVERTED LAB)
Pro B Natriuretic peptide (BNP): 766 — ABNORMAL HIGH
Pro B Natriuretic peptide (BNP): 940 — ABNORMAL HIGH

## 2010-12-30 LAB — POCT I-STAT 4, (NA,K, GLUC, HGB,HCT)
Glucose, Bld: 144 — ABNORMAL HIGH
Glucose, Bld: 166 — ABNORMAL HIGH
Glucose, Bld: 97
HCT: 38 — ABNORMAL LOW
HCT: 43
Hemoglobin: 12.9 — ABNORMAL LOW
Hemoglobin: 14.6
Hemoglobin: 8.5 — ABNORMAL LOW
Hemoglobin: 9.2 — ABNORMAL LOW
Hemoglobin: 9.9 — ABNORMAL LOW
Operator id: 252761
Operator id: 3406
Potassium: 4.5
Potassium: 4.6
Sodium: 133 — ABNORMAL LOW
Sodium: 138
Sodium: 139

## 2010-12-30 LAB — POCT I-STAT GLUCOSE: Glucose, Bld: 127 — ABNORMAL HIGH

## 2010-12-30 LAB — MAGNESIUM
Magnesium: 2.3
Magnesium: 2.5
Magnesium: 2.5
Magnesium: 3.2 — ABNORMAL HIGH

## 2010-12-30 LAB — DIFFERENTIAL
Basophils Absolute: 0
Lymphocytes Relative: 7 — ABNORMAL LOW
Lymphs Abs: 0.7
Neutrophils Relative %: 84 — ABNORMAL HIGH

## 2010-12-30 LAB — URINALYSIS, ROUTINE W REFLEX MICROSCOPIC
Bilirubin Urine: NEGATIVE
Glucose, UA: NEGATIVE
Hgb urine dipstick: NEGATIVE
Specific Gravity, Urine: 1.015
Urobilinogen, UA: 0.2
pH: 7

## 2010-12-30 LAB — POCT CARDIAC MARKERS
CKMB, poc: <1 — ABNORMAL LOW
Myoglobin, poc: 47.1
Operator id: 173591
Troponin i, poc: <0.05

## 2010-12-30 LAB — CREATININE, SERUM
Creatinine, Ser: 0.7
Creatinine, Ser: 0.94
GFR calc Af Amer: >60
GFR calc Af Amer: >60
GFR calc non Af Amer: >60
GFR calc non Af Amer: >60

## 2010-12-30 LAB — HEMOGLOBIN AND HEMATOCRIT, BLOOD
HCT: 26.8 — ABNORMAL LOW
Hemoglobin: 9.1 — ABNORMAL LOW

## 2010-12-31 LAB — POCT I-STAT, CHEM 8
Calcium, Ion: 1.13
Chloride: 101
Creatinine, Ser: 1.1
Glucose, Bld: 108 — ABNORMAL HIGH
HCT: 39
Hemoglobin: 13.3
Potassium: 4.4

## 2010-12-31 LAB — COMPREHENSIVE METABOLIC PANEL
ALT: 13
Alkaline Phosphatase: 75
BUN: 22
CO2: 29
GFR calc non Af Amer: >60
Glucose, Bld: 113 — ABNORMAL HIGH
Potassium: 4.3
Sodium: 138
Total Protein: 6.3

## 2010-12-31 LAB — BASIC METABOLIC PANEL
BUN: 20
BUN: 21
BUN: 24 — ABNORMAL HIGH
CO2: 31
CO2: 32
CO2: 33 — ABNORMAL HIGH
CO2: 34 — ABNORMAL HIGH
Calcium: 9.2
Calcium: 9.2
Calcium: 9.2
Chloride: 96
Chloride: 97
Creatinine, Ser: 1.03
Creatinine, Ser: 1.07
Creatinine, Ser: 1.11
Creatinine, Ser: 1.18
GFR calc Af Amer: >60
GFR calc non Af Amer: >60
Glucose, Bld: 113 — ABNORMAL HIGH
Glucose, Bld: 115 — ABNORMAL HIGH
Glucose, Bld: 124 — ABNORMAL HIGH
Glucose, Bld: 125 — ABNORMAL HIGH
Sodium: 136

## 2010-12-31 LAB — CBC
Hemoglobin: 12.3 — ABNORMAL LOW
Hemoglobin: 12.3 — ABNORMAL LOW
MCHC: 32.7
MCHC: 33.6
MCV: 81.8
MCV: 82
Platelets: 317
RBC: 4.48
RBC: 4.59
RDW: 14.7
RDW: 15
RDW: 15
WBC: 9.3

## 2010-12-31 LAB — DIFFERENTIAL
Basophils Absolute: 0
Lymphocytes Relative: 7 — ABNORMAL LOW
Lymphs Abs: 0.6 — ABNORMAL LOW
Neutrophils Relative %: 83 — ABNORMAL HIGH

## 2010-12-31 LAB — TSH: TSH: 3.535

## 2010-12-31 LAB — PROTIME-INR
INR: 1.8 — ABNORMAL HIGH
Prothrombin Time: 21.7 — ABNORMAL HIGH

## 2010-12-31 LAB — POCT CARDIAC MARKERS
CKMB, poc: <1 — ABNORMAL LOW
Myoglobin, poc: 53.1

## 2010-12-31 LAB — HEPARIN LEVEL (UNFRACTIONATED): Heparin Unfractionated: 0.61

## 2010-12-31 LAB — MAGNESIUM: Magnesium: 2.2

## 2011-01-11 LAB — CBC
HCT: 42.2
Hemoglobin: 14.8
Hemoglobin: 14.8
Hemoglobin: 14.9
Hemoglobin: 15.5
MCHC: 34.4
MCHC: 35.1
MCHC: 35.3
MCV: 90.7
MCV: 92.3
RBC: 4.64
RBC: 4.64
RBC: 4.65
RBC: 4.87
RDW: 12.5
RDW: 12.6

## 2011-01-11 LAB — COMPREHENSIVE METABOLIC PANEL
AST: 23
Albumin: 3.7
Chloride: 105
Creatinine, Ser: 0.91
GFR calc Af Amer: >60
Total Bilirubin: 1.9 — ABNORMAL HIGH
Total Protein: 6.1

## 2011-01-11 LAB — HEPARIN LEVEL (UNFRACTIONATED)
Heparin Unfractionated: 0.11 — ABNORMAL LOW
Heparin Unfractionated: 0.3

## 2011-01-11 LAB — APTT: aPTT: 27

## 2011-01-11 LAB — DIFFERENTIAL
Eosinophils Relative: 3
Lymphocytes Relative: 14
Lymphs Abs: 1.1
Monocytes Absolute: 0.6
Monocytes Relative: 7
Neutro Abs: 5.9

## 2011-01-11 LAB — BASIC METABOLIC PANEL
Calcium: 8.6
Creatinine, Ser: 0.98
GFR calc Af Amer: >60
GFR calc non Af Amer: >60
Sodium: 139

## 2011-01-11 LAB — CK TOTAL AND CKMB (NOT AT ARMC)
CK, MB: 1
CK, MB: 1.1
Relative Index: INVALID
Relative Index: INVALID
Total CK: 45
Total CK: 74
Total CK: 78

## 2011-01-11 LAB — PROTIME-INR
INR: 0.9
INR: 1
INR: 1.1
Prothrombin Time: 13.2
Prothrombin Time: 14.9

## 2011-01-11 LAB — TSH: TSH: 1.963

## 2011-01-11 LAB — TROPONIN I: Troponin I: 0.02

## 2011-01-22 ENCOUNTER — Encounter: Payer: Self-pay | Admitting: Cardiology

## 2011-02-03 ENCOUNTER — Encounter: Payer: Self-pay | Admitting: Cardiology

## 2011-02-03 ENCOUNTER — Ambulatory Visit (INDEPENDENT_AMBULATORY_CARE_PROVIDER_SITE_OTHER): Payer: Medicare Other | Admitting: Cardiology

## 2011-02-03 DIAGNOSIS — I428 Other cardiomyopathies: Secondary | ICD-10-CM

## 2011-02-03 DIAGNOSIS — I10 Essential (primary) hypertension: Secondary | ICD-10-CM

## 2011-02-03 DIAGNOSIS — Z95810 Presence of automatic (implantable) cardiac defibrillator: Secondary | ICD-10-CM

## 2011-02-03 DIAGNOSIS — I4891 Unspecified atrial fibrillation: Secondary | ICD-10-CM

## 2011-02-03 DIAGNOSIS — Z79899 Other long term (current) drug therapy: Secondary | ICD-10-CM

## 2011-02-03 LAB — BASIC METABOLIC PANEL WITH GFR
CO2: 27 mEq/L (ref 19–32)
Chloride: 102 mEq/L (ref 96–112)
GFR, Est Non African American: 71 mL/min — ABNORMAL LOW (ref >90)
Potassium: 4.5 mEq/L (ref 3.5–5.3)

## 2011-02-03 MED ORDER — CARVEDILOL 25 MG PO TABS: 25.0000 mg | ORAL_TABLET | Freq: Two times a day (BID) | ORAL | Status: DC

## 2011-02-03 NOTE — Assessment & Plan Note (Signed)
Status post replacement. 

## 2011-02-03 NOTE — Assessment & Plan Note (Signed)
Continue ACE inhibitor. Increase carvedilol to 25 mg p.o. B.i.d.

## 2011-02-03 NOTE — Assessment & Plan Note (Signed)
Continued SBE prophylaxis. Repeat echocardiogram when he returns in one year.

## 2011-02-03 NOTE — Progress Notes (Signed)
VHQ:IONGEXBM male with past medical history of bicuspid aortic valve status post porcine aortic valve replacement in 2009 as well as Cox maze procedure for atrial fibrillation and NICM for F/U. Note a cardiac catheterization in April of 2009 prior to his procedure showed an ejection fraction of 20%, no coronary disease, a dilated aortic root, moderate aortic stenosis and mild mitral regurgitation. The patient subsequently had aortic valve replacement with 25 mm Carpentier-Edwards valve, replacement of ascending aortic aneurysm with 38-mm Hemishield graft, modified Cox maze III. Note he did develop atrial fibrillation 6 months after his procedure. A cardioversion was attempted by his report but he only held sinus for one week. Last echocardiogram performed 8/11 and showed Ejection fraction was 35%, moderate LAE, mild mitral regurgitation; prosthetic aortic valve with mean gradient of 10 mmHg. Holter monitor in Oct 2011 showed atrial flutter with controlled ventricular response. Patient had CRT-D on December 2 of 2011. He was placed on tikosyn for his atrial fibrillation/flutter. His dose was decreased because of increasing QT. I last saw him in Dec 2011. Since then, the patient denies any dyspnea on exertion, orthopnea, PND, pedal edema, palpitations, syncope or chest pain.    Current Outpatient Prescriptions  Medication Sig Dispense Refill  . carvedilol (COREG) 12.5 MG tablet Take 12.5 mg by mouth 2 (two) times daily with a meal.       . dabigatran (PRADAXA) 150 MG CAPS Take 1 capsule (150 mg total) by mouth every 12 (twelve) hours.  60 capsule  6  . furosemide (LASIX) 20 MG tablet One tablet every other day      . lisinopril (PRINIVIL,ZESTRIL) 20 MG tablet Take 1 tablet (20 mg total) by mouth daily.  30 tablet  11  . metFORMIN (GLUCOPHAGE) 500 MG tablet Take 500 mg by mouth daily with breakfast.        . TIKOSYN 250 MCG capsule TAKE ONE CAPSULE TWICE A DAY  60 capsule  6     Past Medical History    Diagnosis Date  . LBBB (left bundle branch block)   . Atrial fibrillation   . Atrial flutter   . Aortal stenosis   . Ascending aortic aneurysm   . DJD (degenerative joint disease)   . Bicuspid aortic valve   . OSA (obstructive sleep apnea)   . Cholelithiases   . Non-ischemic cardiomyopathy     Past Surgical History  Procedure Date  . Median sternotomy   . Aortic valve replacement   . Appendectomy   . Tonsillectomy   . Cervical discectomy 5/01    C7  . Cholecystectomy     History   Social History  . Marital Status: Married    Spouse Name: N/A    Number of Children: 2  . Years of Education: N/A   Occupational History  . retired Art gallery manager    Social History Main Topics  . Smoking status: Former Games developer  . Smokeless tobacco: Not on file  . Alcohol Use: No  . Drug Use: Not on file  . Sexually Active: Not on file   Other Topics Concern  . Not on file   Social History Narrative  . No narrative on file    ROS: no fevers or chills, productive cough, hemoptysis, dysphasia, odynophagia, melena, hematochezia, dysuria, hematuria, rash, seizure activity, orthopnea, PND, pedal edema, claudication. Remaining systems are negative.  Physical Exam: Well-developed well-nourished in no acute distress.  Skin is warm and dry.  HEENT is normal.  Neck is supple. No thyromegaly.  Chest is clear to auscultation with normal expansion.  Cardiovascular exam is regular rate and rhythm. 2/6 systolic murmur left border. No diastolic murmur. Abdominal exam nontender or distended. No masses palpated. Extremities show no edema. neuro grossly intact  ECG AV sequential pacemaker.

## 2011-02-03 NOTE — Assessment & Plan Note (Signed)
Blood pressure elevated; increase coreg to 25 mg po b.i.d.

## 2011-02-03 NOTE — Assessment & Plan Note (Signed)
Patient's rhythm is AV paced. Continue tikosyn and dabigitran. Check potassium, magnesium and renal function and hemoglobin.

## 2011-02-03 NOTE — Patient Instructions (Signed)
Your physician wants you to follow-up in: ONE YEAR You will receive a reminder letter in the mail two months in advance. If you don't receive a letter, please call our office to schedule the follow-up appointment.   INCREASE CARVEDILOL 25 MG TWICE DAILY  Your physician recommends that you return for lab work in: TODAY

## 2011-02-03 NOTE — Assessment & Plan Note (Signed)
Management per electrophysiology. 

## 2011-02-04 ENCOUNTER — Ambulatory Visit (INDEPENDENT_AMBULATORY_CARE_PROVIDER_SITE_OTHER): Payer: Medicare Other | Admitting: *Deleted

## 2011-02-04 ENCOUNTER — Other Ambulatory Visit: Payer: Self-pay | Admitting: Internal Medicine

## 2011-02-04 DIAGNOSIS — I4891 Unspecified atrial fibrillation: Secondary | ICD-10-CM

## 2011-02-04 DIAGNOSIS — Z95810 Presence of automatic (implantable) cardiac defibrillator: Secondary | ICD-10-CM

## 2011-02-04 DIAGNOSIS — I2589 Other forms of chronic ischemic heart disease: Secondary | ICD-10-CM

## 2011-02-04 LAB — CBC WITH DIFFERENTIAL/PLATELET
Basophils Absolute: 0 10*3/uL (ref 0.0–0.1)
Eosinophils Relative: 3 % (ref 0–5)
HCT: 45.3 % (ref 39.0–52.0)
Hemoglobin: 15.6 g/dL (ref 13.0–17.0)
Lymphocytes Relative: 16 % (ref 12–46)
Lymphs Abs: 1.1 10*3/uL (ref 0.7–4.0)
MCV: 89.5 fL (ref 78.0–100.0)
Monocytes Absolute: 0.5 10*3/uL (ref 0.1–1.0)
Monocytes Relative: 8 % (ref 3–12)
Neutro Abs: 4.9 10*3/uL (ref 1.7–7.7)
RBC: 5.06 MIL/uL (ref 4.22–5.81)
WBC: 6.7 10*3/uL (ref 4.0–10.5)

## 2011-02-07 LAB — REMOTE ICD DEVICE
CHARGE TIME: 8.3 s
DEVICE MODEL ICD: 485875
HV IMPEDENCE: 54 Ohm
PACEART VT: 0
RV LEAD IMPEDENCE ICD: 696 Ohm
TZAT-0001SLOWVT: 1
TZAT-0013SLOWVT: 2
TZAT-0018FASTVT: NEGATIVE
TZAT-0018SLOWVT: NEGATIVE
TZON-0003FASTVT: 285.7 ms
TZON-0003SLOWVT: 333.3 ms
TZST-0001FASTVT: 3
TZST-0001FASTVT: 6
TZST-0001FASTVT: 7
TZST-0001SLOWVT: 5
TZST-0001SLOWVT: 6
TZST-0003FASTVT: 41 J
TZST-0003FASTVT: 41 J
TZST-0003SLOWVT: 17 J
VF: 0

## 2011-02-19 NOTE — Progress Notes (Signed)
icd remote check  

## 2011-03-24 ENCOUNTER — Encounter: Payer: Self-pay | Admitting: Internal Medicine

## 2011-04-07 ENCOUNTER — Other Ambulatory Visit: Payer: Self-pay | Admitting: Internal Medicine

## 2011-04-21 ENCOUNTER — Ambulatory Visit (INDEPENDENT_AMBULATORY_CARE_PROVIDER_SITE_OTHER): Payer: Medicare Other | Admitting: *Deleted

## 2011-04-21 ENCOUNTER — Encounter: Payer: Self-pay | Admitting: Internal Medicine

## 2011-04-21 DIAGNOSIS — I428 Other cardiomyopathies: Secondary | ICD-10-CM

## 2011-04-21 LAB — ICD DEVICE OBSERVATION
AL IMPEDENCE ICD: 635 Ohm
HV IMPEDENCE: 57 Ohm
RV LEAD AMPLITUDE: 25 mv
RV LEAD IMPEDENCE ICD: 691 Ohm
TZAT-0001FASTVT: 1
TZAT-0001SLOWVT: 1
TZAT-0013SLOWVT: 2
TZAT-0018FASTVT: NEGATIVE
TZAT-0018FASTVT: NEGATIVE
TZAT-0018SLOWVT: NEGATIVE
TZON-0003SLOWVT: 333.3 ms
TZST-0001FASTVT: 3
TZST-0001FASTVT: 6
TZST-0001FASTVT: 7
TZST-0001SLOWVT: 5
TZST-0001SLOWVT: 6
TZST-0001SLOWVT: 7
TZST-0003FASTVT: 41 J
TZST-0003FASTVT: 41 J
TZST-0003SLOWVT: 17 J
TZST-0003SLOWVT: 41 J
VENTRICULAR PACING ICD: 92 pct

## 2011-04-21 NOTE — Progress Notes (Signed)
ICD check 

## 2011-04-26 ENCOUNTER — Other Ambulatory Visit: Payer: Self-pay

## 2011-05-06 ENCOUNTER — Other Ambulatory Visit: Payer: Self-pay | Admitting: Cardiology

## 2011-05-06 MED ORDER — DOFETILIDE 250 MCG PO CAPS: 250.0000 ug | ORAL_CAPSULE | Freq: Two times a day (BID) | ORAL | Status: DC

## 2011-05-13 ENCOUNTER — Encounter: Payer: Medicare Other | Admitting: *Deleted

## 2011-05-19 ENCOUNTER — Ambulatory Visit: Payer: Self-pay | Admitting: Cardiovascular Disease

## 2011-06-04 ENCOUNTER — Other Ambulatory Visit: Payer: Self-pay | Admitting: Cardiology

## 2011-07-20 ENCOUNTER — Encounter: Payer: Medicare Other | Admitting: Internal Medicine

## 2011-07-27 ENCOUNTER — Telehealth: Payer: Self-pay | Admitting: *Deleted

## 2011-07-27 NOTE — Telephone Encounter (Signed)
Toni Amend from [facility] called. He could not fill Tikosyn medication under Dr.Crenshaw so I advised him to fill medication under Dr.Klein's name.

## 2011-08-10 ENCOUNTER — Encounter: Payer: Self-pay | Admitting: Internal Medicine

## 2011-08-11 ENCOUNTER — Telehealth: Payer: Self-pay | Admitting: Internal Medicine

## 2011-08-11 NOTE — Telephone Encounter (Signed)
08-11-11 called pt to rs pt's missed appt 07-20-11 with dr klein/mt

## 2011-09-10 ENCOUNTER — Ambulatory Visit (INDEPENDENT_AMBULATORY_CARE_PROVIDER_SITE_OTHER): Payer: Medicare Other | Admitting: Internal Medicine

## 2011-09-10 ENCOUNTER — Encounter: Payer: Self-pay | Admitting: Internal Medicine

## 2011-09-10 VITALS — BP 156/93 | HR 70 | Ht 70.0 in | Wt 231.0 lb

## 2011-09-10 DIAGNOSIS — I10 Essential (primary) hypertension: Secondary | ICD-10-CM | POA: Insufficient documentation

## 2011-09-10 DIAGNOSIS — I4891 Unspecified atrial fibrillation: Secondary | ICD-10-CM

## 2011-09-10 DIAGNOSIS — I428 Other cardiomyopathies: Secondary | ICD-10-CM

## 2011-09-10 DIAGNOSIS — Z95810 Presence of automatic (implantable) cardiac defibrillator: Secondary | ICD-10-CM

## 2011-09-10 DIAGNOSIS — I5022 Chronic systolic (congestive) heart failure: Secondary | ICD-10-CM

## 2011-09-10 LAB — ICD DEVICE OBSERVATION
AL THRESHOLD: 0.7 V
ATRIAL PACING ICD: 92 pct
DEVICE MODEL ICD: 485875
LV LEAD AMPLITUDE: 25 mv
RV LEAD AMPLITUDE: 25 mv
RV LEAD IMPEDENCE ICD: 635 Ohm
TZAT-0001FASTVT: 2
TZAT-0001SLOWVT: 2
TZAT-0013FASTVT: 2
TZAT-0013SLOWVT: 2
TZAT-0013SLOWVT: 2
TZAT-0018FASTVT: NEGATIVE
TZAT-0018SLOWVT: NEGATIVE
TZON-0003SLOWVT: 333.3 ms
TZST-0001FASTVT: 3
TZST-0001FASTVT: 4
TZST-0001FASTVT: 7
TZST-0001FASTVT: 8
TZST-0001SLOWVT: 4
TZST-0001SLOWVT: 6
TZST-0001SLOWVT: 7
TZST-0003FASTVT: 26 J
TZST-0003FASTVT: 36 J
TZST-0003FASTVT: 41 J
TZST-0003FASTVT: 41 J
TZST-0003FASTVT: 41 J
TZST-0003SLOWVT: 17 J
TZST-0003SLOWVT: 31 J
TZST-0003SLOWVT: 41 J
VENTRICULAR PACING ICD: 95 pct

## 2011-09-10 MED ORDER — LISINOPRIL 20 MG PO TABS: ORAL_TABLET | ORAL | Status: DC

## 2011-09-10 MED ORDER — FUROSEMIDE 20 MG PO TABS: ORAL_TABLET | ORAL | Status: DC

## 2011-09-10 NOTE — Assessment & Plan Note (Signed)
The patient has significant elevations of blood pressure and had been elevated at home. We will have him increase his lisinopril to twice daily. He also has: Occurring peripheral edema we will have him take his diuretic daily for 2 weeks. We'll check a metabolic profile and magnesium at that time for this as well as for  Tikosyn surveillance

## 2011-09-10 NOTE — Assessment & Plan Note (Signed)
As above Currently taking Pradaxa. We'll reassess renal function

## 2011-09-10 NOTE — Progress Notes (Signed)
  HPI  [name] is a 71 y.o. male Seen in followup for CRT-D implantation in the fall 2011. He also had a history of atrial flutter. This occurred following  porcine aortic valve replacement in 2009 as well as Cox maze procedure for atrial fibrillation.  He had undergone cardiac catheterization in April of 2009 prior to his procedure showed an ejection fraction of 20%, no coronary disease, a dilated aortic root, moderate aortic stenosis and mild mitral regurgitation. The patient subsequently had aortic valve replacement with 25 mm Carpentier-Edwards valve, replacement of ascending aortic aneurysm with 38-mm Hemishield graft, modified Cox maze III.  Last echocardiogram performed 8/11 and showed Ejection fraction was 35%, moderate LAE, mild mitral regurgitation; prosthetic aortic valve with mean gradient of 10 mmHg.    The patient denies chest pain, shortness of breath, nocturnal dyspnea, orthopnea   There have been no palpitations, lightheadedness or syncope.   He has noted lately that his blood pressure has been; there has also been more peripheral edema. He notes he takes a diuretic every other day. His creatinine in October was 1.05    Past Medical History  Diagnosis Date  . LBBB (left bundle branch block)   . Atrial fibrillation/flutter   . Hypertension   . Aortic stenosis/ bicuspid valve--s/p AVR   . Ascending aortic aneurysm   . DJD (degenerative joint disease)   . Biventricular implantable cardiac defibrillator- BSX   . OSA (obstructive sleep apnea)   . Cholelithiases   . Non-ischemic cardiomyopathy     Past Surgical History  Procedure Date  . Median sternotomy   . Aortic valve replacement   . Appendectomy   . Tonsillectomy   . Cervical discectomy 5/01    C7  . Cholecystectomy     Current Outpatient Prescriptions  Medication Sig Dispense Refill  . carvedilol (COREG) 25 MG tablet Take 1 tablet (25 mg total) by mouth 2 (two) times daily with a meal.  60 tablet  12    . dofetilide (TIKOSYN) 250 MCG capsule Take 1 capsule (250 mcg total) by mouth 2 (two) times daily.  60 capsule  6  . furosemide (LASIX) 20 MG tablet       . lisinopril (PRINIVIL,ZESTRIL) 20 MG tablet Take 1 tablet (20 mg total) by mouth daily.  30 tablet  11  . metFORMIN (GLUCOPHAGE) 500 MG tablet Take 500 mg by mouth daily with breakfast.        . PRADAXA 150 MG CAPS TAKE 1 CAPSULE BY MOUTH EVERY 12 HOURS.  60 capsule  6  . DISCONTD: furosemide (LASIX) 20 MG tablet TAKE 1 TABLET EVERY DAY  30 tablet  5    No Known Allergies  Review of Systems negative except from HPI and PMH  Physical Exam BP 156/93  Pulse 70  Ht 5\' 10"  (1.778 m)  Wt 231 lb (104.781 kg)  BMI 33.15 kg/m2 Well developed and well nourished in no acute distress HENT normal E scleral and icterus clear Neck Supple JVP flat; carotids brisk and full Clear to ausculation Regular rate and rhythm, no murmurs gallops or rub Soft with active bowel sounds No clubbing cyanosis 1+ Edema Alert and oriented, grossly normal motor and sensory function Skin Warm and Dry  Electrocardiogram demonstrates AV pacing  Assessment and  Plan

## 2011-09-10 NOTE — Patient Instructions (Signed)
Your physician has recommended you make the following change in your medication:  1) Increase lasix to 20 mg one tablet by mouth daily for 2 weeks, then resume one tablet by mouth every other day. 2) Increase lisinopril to 20 mg one tablet by mouth twice daily.  Your physician recommends that you return for lab work in: 2 weeks- bmp/magesium  Your physician wants you to follow-up in: 1 year with Dr. Graciela Husbands. You will receive a reminder letter in the mail two months in advance. If you don't receive a letter, please call our office to schedule the follow-up appointment.

## 2011-09-10 NOTE — Assessment & Plan Note (Addendum)
As above; he is currently stable class II symptoms

## 2011-09-10 NOTE — Assessment & Plan Note (Signed)
The patient's device was interrogated and the information was fully reviewed.  The device was reprogrammed to  Minimize intrinsic conduction by shortening his AV delay and increasing change with activity

## 2011-09-10 NOTE — Assessment & Plan Note (Signed)
Continue current medications. 

## 2011-09-24 ENCOUNTER — Other Ambulatory Visit: Payer: Self-pay | Admitting: Internal Medicine

## 2011-09-24 LAB — BASIC METABOLIC PANEL
BUN: 21 mg/dL (ref 6–23)
CO2: 27 mEq/L (ref 19–32)
Calcium: 9.4 mg/dL (ref 8.4–10.5)
Creat: 1.03 mg/dL (ref 0.50–1.35)
Glucose, Bld: 271 mg/dL — ABNORMAL HIGH (ref 70–99)

## 2011-09-24 LAB — MAGNESIUM: Magnesium: 1.9 mg/dL (ref 1.5–2.5)

## 2011-11-04 ENCOUNTER — Other Ambulatory Visit: Payer: Self-pay | Admitting: Internal Medicine

## 2011-12-07 ENCOUNTER — Other Ambulatory Visit: Payer: Self-pay | Admitting: Internal Medicine

## 2011-12-16 ENCOUNTER — Encounter: Payer: Medicare Other | Admitting: *Deleted

## 2011-12-16 ENCOUNTER — Encounter: Payer: Self-pay | Admitting: Internal Medicine

## 2011-12-16 ENCOUNTER — Other Ambulatory Visit: Payer: Self-pay

## 2011-12-16 DIAGNOSIS — I428 Other cardiomyopathies: Secondary | ICD-10-CM

## 2011-12-29 LAB — REMOTE ICD DEVICE
AL IMPEDENCE ICD: 644 Ohm
ATRIAL PACING ICD: 95 pct
FVT: 0
LV LEAD IMPEDENCE ICD: 627 Ohm
TOT-0006: 20130801000000
TZAT-0001FASTVT: 1
TZAT-0001FASTVT: 2
TZAT-0001SLOWVT: 2
TZAT-0002FASTVT: NEGATIVE
TZAT-0013FASTVT: 2
TZAT-0013SLOWVT: 2
TZAT-0018FASTVT: NEGATIVE
TZAT-0018SLOWVT: NEGATIVE
TZON-0003FASTVT: 285.7 ms
TZST-0001FASTVT: 4
TZST-0001FASTVT: 5
TZST-0001FASTVT: 8
TZST-0001SLOWVT: 3
TZST-0001SLOWVT: 4
TZST-0001SLOWVT: 5
TZST-0003FASTVT: 26 J
TZST-0003FASTVT: 36 J
TZST-0003FASTVT: 41 J
TZST-0003FASTVT: 41 J
TZST-0003SLOWVT: 31 J
TZST-0003SLOWVT: 41 J
TZST-0003SLOWVT: 41 J
VENTRICULAR PACING ICD: 91 pct
VF: 0

## 2012-02-09 ENCOUNTER — Other Ambulatory Visit: Payer: Self-pay | Admitting: Cardiology

## 2012-03-16 ENCOUNTER — Encounter: Payer: Self-pay | Admitting: Internal Medicine

## 2012-03-16 ENCOUNTER — Ambulatory Visit (INDEPENDENT_AMBULATORY_CARE_PROVIDER_SITE_OTHER): Payer: Medicare Other | Admitting: *Deleted

## 2012-03-16 DIAGNOSIS — Z95810 Presence of automatic (implantable) cardiac defibrillator: Secondary | ICD-10-CM

## 2012-03-16 DIAGNOSIS — I5022 Chronic systolic (congestive) heart failure: Secondary | ICD-10-CM

## 2012-03-16 LAB — REMOTE ICD DEVICE
BRDY-0003RV: 130 {beats}/min
BRDY-0004RV: 130 {beats}/min
DEVICE MODEL ICD: 485875
HV IMPEDENCE: 57 Ohm
PACEART VT: 0
RV LEAD IMPEDENCE ICD: 712 Ohm
TZAT-0001FASTVT: 2
TZAT-0001SLOWVT: 1
TZAT-0001SLOWVT: 2
TZAT-0002FASTVT: NEGATIVE
TZAT-0013SLOWVT: 2
TZAT-0018FASTVT: NEGATIVE
TZAT-0018SLOWVT: NEGATIVE
TZAT-0018SLOWVT: NEGATIVE
TZON-0003FASTVT: 285.7 ms
TZST-0001FASTVT: 5
TZST-0001FASTVT: 6
TZST-0001FASTVT: 7
TZST-0001FASTVT: 8
TZST-0001SLOWVT: 4
TZST-0001SLOWVT: 5
TZST-0003FASTVT: 26 J
TZST-0003FASTVT: 36 J
TZST-0003FASTVT: 41 J
TZST-0003SLOWVT: 17 J
TZST-0003SLOWVT: 31 J
TZST-0003SLOWVT: 41 J
VENTRICULAR PACING ICD: 91 pct
VF: 0

## 2012-03-21 ENCOUNTER — Encounter: Payer: Self-pay | Admitting: *Deleted

## 2012-04-06 ENCOUNTER — Other Ambulatory Visit: Payer: Self-pay | Admitting: *Deleted

## 2012-04-06 MED ORDER — DOFETILIDE 250 MCG PO CAPS: 250.0000 ug | ORAL_CAPSULE | Freq: Two times a day (BID) | ORAL | Status: DC

## 2012-04-07 ENCOUNTER — Telehealth: Payer: Self-pay | Admitting: Cardiology

## 2012-04-07 NOTE — Telephone Encounter (Signed)
Spoke with pt, questions regarding meds and ICD answered.

## 2012-04-07 NOTE — Telephone Encounter (Signed)
New Problem:    Patient called in with a question about his dofetilide (TIKOSYN) 250 MCG capsule.  Please call back.

## 2012-06-22 ENCOUNTER — Ambulatory Visit (INDEPENDENT_AMBULATORY_CARE_PROVIDER_SITE_OTHER): Payer: Medicare Other | Admitting: *Deleted

## 2012-06-22 ENCOUNTER — Other Ambulatory Visit: Payer: Self-pay

## 2012-06-22 DIAGNOSIS — I428 Other cardiomyopathies: Secondary | ICD-10-CM

## 2012-06-22 DIAGNOSIS — Z95810 Presence of automatic (implantable) cardiac defibrillator: Secondary | ICD-10-CM

## 2012-06-26 LAB — REMOTE ICD DEVICE
AL IMPEDENCE ICD: 598 Ohm
ATRIAL PACING ICD: 94 pct
LV LEAD IMPEDENCE ICD: 637 Ohm
RV LEAD AMPLITUDE: 25 mv
RV LEAD IMPEDENCE ICD: 700 Ohm
TOT-0006: 20131212000000
TZAT-0001FASTVT: 2
TZAT-0001SLOWVT: 2
TZAT-0013SLOWVT: 2
TZAT-0018FASTVT: NEGATIVE
TZON-0003FASTVT: 285.7 ms
TZON-0003SLOWVT: 333.3 ms
TZST-0001FASTVT: 8
TZST-0001SLOWVT: 5
TZST-0001SLOWVT: 6
TZST-0003FASTVT: 36 J
TZST-0003FASTVT: 41 J
TZST-0003FASTVT: 41 J
TZST-0003SLOWVT: 17 J
TZST-0003SLOWVT: 31 J
VF: 0

## 2012-07-06 ENCOUNTER — Encounter: Payer: Self-pay | Admitting: *Deleted

## 2012-08-11 ENCOUNTER — Other Ambulatory Visit: Payer: Self-pay | Admitting: Internal Medicine

## 2012-09-06 ENCOUNTER — Encounter: Payer: Self-pay | Admitting: Internal Medicine

## 2012-09-13 ENCOUNTER — Ambulatory Visit (INDEPENDENT_AMBULATORY_CARE_PROVIDER_SITE_OTHER): Payer: Medicare Other | Admitting: Internal Medicine

## 2012-09-13 ENCOUNTER — Encounter: Payer: Self-pay | Admitting: Internal Medicine

## 2012-09-13 ENCOUNTER — Other Ambulatory Visit: Payer: Self-pay | Admitting: Cardiology

## 2012-09-13 VITALS — BP 165/90 | HR 70 | Wt 230.0 lb

## 2012-09-13 DIAGNOSIS — I10 Essential (primary) hypertension: Secondary | ICD-10-CM

## 2012-09-13 DIAGNOSIS — Z95810 Presence of automatic (implantable) cardiac defibrillator: Secondary | ICD-10-CM

## 2012-09-13 DIAGNOSIS — I428 Other cardiomyopathies: Secondary | ICD-10-CM

## 2012-09-13 DIAGNOSIS — I5022 Chronic systolic (congestive) heart failure: Secondary | ICD-10-CM

## 2012-09-13 DIAGNOSIS — I4891 Unspecified atrial fibrillation: Secondary | ICD-10-CM

## 2012-09-13 LAB — ICD DEVICE OBSERVATION
AL AMPLITUDE: 1.9 mv
AL IMPEDENCE ICD: 584 Ohm
AL THRESHOLD: 0.8 V
CHARGE TIME: 8.7 s
DEVICE MODEL ICD: 485875
HV IMPEDENCE: 57 Ohm
LV LEAD THRESHOLD: 1.4 V
RV LEAD AMPLITUDE: 25 mv
RV LEAD THRESHOLD: 0.9 V
TZAT-0001FASTVT: 2
TZAT-0001SLOWVT: 2
TZAT-0002FASTVT: NEGATIVE
TZAT-0013FASTVT: 2
TZAT-0013SLOWVT: 2
TZAT-0018FASTVT: NEGATIVE
TZAT-0018SLOWVT: NEGATIVE
TZST-0001FASTVT: 4
TZST-0001FASTVT: 5
TZST-0001FASTVT: 8
TZST-0001SLOWVT: 3
TZST-0001SLOWVT: 4
TZST-0001SLOWVT: 7
TZST-0003FASTVT: 26 J
TZST-0003FASTVT: 36 J
TZST-0003FASTVT: 41 J
TZST-0003FASTVT: 41 J
TZST-0003SLOWVT: 31 J
TZST-0003SLOWVT: 41 J
TZST-0003SLOWVT: 41 J

## 2012-09-13 LAB — BASIC METABOLIC PANEL
BUN: 17 mg/dL (ref 6–23)
Calcium: 9.8 mg/dL (ref 8.4–10.5)
Chloride: 104 mEq/L (ref 96–112)
Creatinine, Ser: 1 mg/dL (ref 0.4–1.5)

## 2012-09-13 NOTE — Progress Notes (Signed)
Patient Care Team: Gaspar Garbe, MD as PCP - General   HPI  [name] is a 72 y.o. male Seen in followup for CRT-D implantation in the fall 2011. He also had a history of atrial flutter. In 2009 he underwent  porcine aortic valve replacement with 25 mm Carpentier-Edwards valve, replacement of ascending aortic aneurysm with 38-mm Hemishield graft, s well as Cox maze procedure for atrial fibrillation. He takes dofetilide. Surveillance laboratories were normal June/13  He had undergone cardiac catheterization in April of 2009>> s ejection fraction of 20%, no coronary disease,    Last echocardiogram performed 8/11 and showed Ejection fraction was 35%, moderate LAE, mild mitral regurgitation; prosthetic aortic valve with mean gradient of 10 mmHg.      Past Medical History  Diagnosis Date  . LBBB (left bundle branch block)   . Atrial fibrillation/flutter   . Hypertension   . Aortic stenosis/ bicuspid valve--s/p AVR   . Ascending aortic aneurysm   . DJD (degenerative joint disease)   . Biventricular implantable cardiac defibrillator- BSX   . OSA (obstructive sleep apnea)   . Cholelithiases   . Non-ischemic cardiomyopathy     Past Surgical History  Procedure Laterality Date  . Median sternotomy    . Aortic valve replacement    . Appendectomy    . Tonsillectomy    . Cervical discectomy  5/01    C7  . Cholecystectomy      Current Outpatient Prescriptions  Medication Sig Dispense Refill  . carvedilol (COREG) 25 MG tablet TAKE 1 TABLET BY MOUTH TWICE DAILY WITH A MEAL  60 tablet  6  . furosemide (LASIX) 20 MG tablet Take one tablet by mouth every other day  30 tablet  6  . lisinopril (PRINIVIL,ZESTRIL) 20 MG tablet Take one tablet by mouth twice daily  60 tablet  11  . metFORMIN (GLUCOPHAGE) 500 MG tablet Take 500 mg by mouth daily with breakfast.        . PRADAXA 150 MG CAPS TAKE 1 CAPSULE BY MOUTH EVERY 12 HOURS  60 capsule  2  . TIKOSYN 250 MCG capsule TAKE 1 CAPSULE  BY MOUTH 2 TIMES DAILY.  60 capsule  3   No current facility-administered medications for this visit.    No Known Allergies  Review of Systems negative except from HPI and PMH  Physical Exam BP 165/90  Pulse 70  Wt 230 lb (104.327 kg)  BMI 33 kg/m2 Well developed and well nourished in no acute distress HENT normal E scleral and icterus clear Neck Supple JVP flat; carotids brisk and full Clear to ausculation Regular rate and rhythm, 2/6 murmur and an S4Soft with active bowel sounds No clubbing cyanosis Trace Edema Alert and oriented, grossly normal motor and sensory function Skin Warm and Dry  ECG demonstrates AV pacing with a QRS duration of 174 QTC of 522 which gives corrected T oJTC of about 450  Assessment and  Plan

## 2012-09-13 NOTE — Patient Instructions (Signed)
Your physician recommends that you continue on your current medications as directed. Please refer to the Current Medication list given to you today.  Your physician recommends that you return for lab work in: today  Your physician wants you to follow-up in: 1 year. You will receive a reminder letter in the mail two months in advance. If you don't receive a letter, please call our office to schedule the follow-up appointment.    

## 2012-09-13 NOTE — Assessment & Plan Note (Signed)
euvolemic we'll continue current medications.   

## 2012-09-13 NOTE — Assessment & Plan Note (Signed)
Poorly controlled; as above we'll begin Aldactone

## 2012-09-13 NOTE — Assessment & Plan Note (Signed)
The patient has persistent nonischemic cardiomyopathy. I will asked Dr. Jens Som as to whether he would benefit from the addition of Aldactone. His blood pressure is quite elevated today.

## 2012-09-13 NOTE — Assessment & Plan Note (Signed)
No intercurrent atrial fibrillation. We'll continue dofetilide. We'll check surveillance laboratories today. QT interval is okay

## 2012-09-13 NOTE — Assessment & Plan Note (Signed)
The patient's device was interrogated.  The information was reviewed. No changes were made in the programming.    

## 2012-09-18 ENCOUNTER — Other Ambulatory Visit: Payer: Self-pay | Admitting: Internal Medicine

## 2012-09-21 ENCOUNTER — Other Ambulatory Visit: Payer: Self-pay | Admitting: *Deleted

## 2012-09-21 MED ORDER — FUROSEMIDE 20 MG PO TABS: 20.0000 mg | ORAL_TABLET | Freq: Every day | ORAL | Status: DC

## 2012-11-16 ENCOUNTER — Other Ambulatory Visit: Payer: Self-pay | Admitting: *Deleted

## 2012-11-16 MED ORDER — DABIGATRAN ETEXILATE MESYLATE 150 MG PO CAPS: ORAL_CAPSULE | ORAL | Status: DC

## 2012-11-16 MED ORDER — DOFETILIDE 250 MCG PO CAPS: ORAL_CAPSULE | ORAL | Status: DC

## 2012-12-14 ENCOUNTER — Encounter: Payer: Self-pay | Admitting: Internal Medicine

## 2012-12-14 ENCOUNTER — Ambulatory Visit (INDEPENDENT_AMBULATORY_CARE_PROVIDER_SITE_OTHER): Payer: Medicare Other | Admitting: *Deleted

## 2012-12-14 DIAGNOSIS — I428 Other cardiomyopathies: Secondary | ICD-10-CM

## 2012-12-14 DIAGNOSIS — Z95810 Presence of automatic (implantable) cardiac defibrillator: Secondary | ICD-10-CM

## 2012-12-15 LAB — REMOTE ICD DEVICE
AL IMPEDENCE ICD: 582 Ohm
TZAT-0001SLOWVT: 1
TZAT-0013SLOWVT: 2
TZAT-0018FASTVT: NEGATIVE
TZAT-0018SLOWVT: NEGATIVE
TZON-0003FASTVT: 285.7 ms
TZON-0003SLOWVT: 333.3 ms
TZST-0001FASTVT: 3
TZST-0001FASTVT: 6
TZST-0001FASTVT: 7
TZST-0001SLOWVT: 5
TZST-0001SLOWVT: 6
TZST-0003FASTVT: 41 J
TZST-0003FASTVT: 41 J
TZST-0003SLOWVT: 17 J
TZST-0003SLOWVT: 41 J
VENTRICULAR PACING ICD: 88 pct

## 2012-12-18 NOTE — Progress Notes (Signed)
ICD remote. All functions normal, no NSVT. Full details in [facility].  [place] 03/15/13

## 2012-12-20 ENCOUNTER — Other Ambulatory Visit: Payer: Self-pay

## 2012-12-20 MED ORDER — DOFETILIDE 250 MCG PO CAPS: ORAL_CAPSULE | ORAL | Status: DC

## 2013-01-08 ENCOUNTER — Ambulatory Visit (INDEPENDENT_AMBULATORY_CARE_PROVIDER_SITE_OTHER): Payer: Medicare Other | Admitting: *Deleted

## 2013-01-08 ENCOUNTER — Telehealth: Payer: Self-pay | Admitting: *Deleted

## 2013-01-08 DIAGNOSIS — Z95810 Presence of automatic (implantable) cardiac defibrillator: Secondary | ICD-10-CM

## 2013-01-08 NOTE — Progress Notes (Signed)
Pt Bi-V pacing 88% of time. Changed paced AV delay from 160-189ms to 130-19ms.  [place] 03/15/13 & ROV w/ Dr. Graciela Husbands 09/2013.

## 2013-01-08 NOTE — Telephone Encounter (Signed)
Bringing pt in today to change AV delay per Dr. Graciela Husbands.  Paced AV delay currently at 160-162ms w/ Bi-V pacing 88%.

## 2013-02-12 ENCOUNTER — Encounter: Payer: Self-pay | Admitting: Internal Medicine

## 2013-02-14 ENCOUNTER — Ambulatory Visit (INDEPENDENT_AMBULATORY_CARE_PROVIDER_SITE_OTHER): Payer: Medicare Other | Admitting: Cardiology

## 2013-02-14 ENCOUNTER — Encounter: Payer: Self-pay | Admitting: Cardiology

## 2013-02-14 VITALS — BP 134/80 | HR 70 | Ht 70.0 in | Wt 228.0 lb

## 2013-02-14 DIAGNOSIS — I4891 Unspecified atrial fibrillation: Secondary | ICD-10-CM

## 2013-02-14 DIAGNOSIS — I719 Aortic aneurysm of unspecified site, without rupture: Secondary | ICD-10-CM

## 2013-02-14 DIAGNOSIS — R0989 Other specified symptoms and signs involving the circulatory and respiratory systems: Secondary | ICD-10-CM

## 2013-02-14 DIAGNOSIS — I509 Heart failure, unspecified: Secondary | ICD-10-CM

## 2013-02-14 MED ORDER — FUROSEMIDE 40 MG PO TABS: ORAL_TABLET | ORAL | Status: DC

## 2013-02-14 MED ORDER — SPIRONOLACTONE 25 MG PO TABS: 25.0000 mg | ORAL_TABLET | Freq: Every day | ORAL | Status: DC

## 2013-02-14 NOTE — Patient Instructions (Addendum)
Your physician has requested that you have an echocardiogram. Echocardiography is a painless test that uses sound waves to create images of your heart. It provides your doctor with information about the size and shape of your heart and how well your heart's chambers and valves are working. This procedure takes approximately one hour. There are no restrictions for this procedure. On November 25 @ 2:00 at [address] location  You will have and ultrasound of the abdomen at our [address] location November 20th @ 8:00am   You will have a CTA of the Thoriac Aorta on November 19 @ 9:00 am at the [address] location, nothing to eat or drink 2 hours prior to test and hold metformin   Your physician has recommended you make the following change in your medication:  TAKE LASIX (40 MG ) DAILY AND EXTRA DOSE IF NEEDED START ALDACTONE ( 25 MG) DAILY IN THE AFTERNOON THAT WAS SENT INTO YOUR PHARMACY   Your physician recommends that you return for lab work ON November 18TH BETWEEN 8 AND 5 DONT FORGET TO BRING LAB PAPERWORK

## 2013-02-14 NOTE — Assessment & Plan Note (Signed)
Blood pressure controlled. Continue present medications. 

## 2013-02-14 NOTE — Progress Notes (Signed)
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 Note a cardiac catheterization in April of 2009 prior to his procedure showed an ejection fraction of 20%, no coronary disease, a dilated aortic root, moderate aortic stenosis and mild mitral regurgitation. The patient subsequently had aortic valve replacement with 25 mm Carpentier-Edwards valve, replacement of ascending aortic aneurysm with 38-mm Hemishield graft, modified Cox maze III. Note he did develop atrial fibrillation 6 months after his procedure. A cardioversion was attempted by his report but he only held sinus for one week. Last echocardiogram performed 8/11 and showed Ejection fraction was 35%, moderate LAE, mild mitral regurgitation; prosthetic aortic valve with mean gradient of 10 mmHg. Holter monitor in Oct 2011 showed atrial flutter with controlled ventricular response. Patient had CRT-D on December 2 of 2011. He was placed on tikosyn for his atrial fibrillation/flutter. His dose was decreased because of increasing QT. I last saw him in Oct of 2012. Since then, the patient denies any dyspnea on exertion, orthopnea, PND, palpitations, syncope or chest pain. Occasional mild pedal edema.   Current Outpatient Prescriptions  Medication Sig Dispense Refill  . carvedilol (COREG) 25 MG tablet TAKE 1 TABLET BY MOUTH TWICE DAILY WITH A MEAL  60 tablet  6  . dabigatran (PRADAXA) 150 MG CAPS capsule TAKE 1 CAPSULE BY MOUTH EVERY 12 HOURS  60 capsule  2  . dofetilide (TIKOSYN) 250 MCG capsule TAKE 1 CAPSULE BY MOUTH 2 TIMES DAILY.  60 capsule  11  . lisinopril (PRINIVIL,ZESTRIL) 40 MG tablet Take 40 mg by mouth 2 (two) times daily.      . metFORMIN (GLUCOPHAGE) 500 MG tablet Take 1,000 mg by mouth daily with breakfast.       . furosemide (LASIX) 20 MG tablet Take 40 mg by mouth 2 (two) times daily.        No current facility-administered medications for this visit.     Past Medical History  Diagnosis Date  . LBBB (left bundle branch block)   . Atrial fibrillation/flutter   . Hypertension   . Aortic stenosis/ bicuspid valve--s/p AVR   . Ascending aortic aneurysm   . DJD (degenerative joint disease)   . Biventricular implantable cardiac defibrillator- BSX   . OSA (obstructive sleep apnea)   . Cholelithiases   . Non-ischemic cardiomyopathy     Past Surgical History  Procedure Laterality Date  . Median sternotomy    . Aortic valve replacement    . Appendectomy    . Tonsillectomy    . Cervical discectomy  5/01    C7  . Cholecystectomy      History   Social History  . Marital Status: Married    Spouse Name: N/A    Number of Children: 2  . Years of Education: N/A   Occupational History  . retired Art gallery manager    Social History Main Topics  . Smoking status: Former Games developer  . Smokeless tobacco: Not on file  . Alcohol Use: No  . Drug Use: Not on file  . Sexual Activity: Not on file   Other Topics Concern  . Not on file   Social History Narrative  . No narrative on file    ROS: no fevers or chills, productive cough, hemoptysis, dysphasia, odynophagia, melena, hematochezia, dysuria, hematuria, rash, seizure activity, orthopnea, PND, pedal edema, claudication. Remaining systems are negative.  Physical Exam: Well-developed well-nourished in no acute distress.  Skin is warm and dry.  HEENT is normal.  Neck is supple.  Chest is clear to auscultation with normal expansion.  Cardiovascular exam is regular rate and rhythm.  Abdominal exam nontender or distended. No masses palpated. Extremities show trace edema. neuro grossly intact  ECG AV paced.

## 2013-02-14 NOTE — Assessment & Plan Note (Signed)
Continue SBE prophylaxis. Repeat echocardiogram. 

## 2013-02-14 NOTE — Assessment & Plan Note (Signed)
Patient remains in sinus rhythm. Continue tikosyn and coreg. Continue pradaxa. I discussed that this medication is not typically recommended in patients with previous prosthetic valves. However he would prefer to continue this instead of Coumadin and understands this is off label and not recommended. Check hemoglobin and renal function.

## 2013-02-14 NOTE — Assessment & Plan Note (Signed)
Status post aortic root replacement. Plan CTA of thoracic aorta.

## 2013-02-14 NOTE — Assessment & Plan Note (Signed)
Followed by electrophysiology. 

## 2013-02-14 NOTE — Assessment & Plan Note (Signed)
Continue ACE inhibitor and beta blocker. Change Lasix to 40 mg daily with additional 40 mg daily as needed. Add spironolactone 25 mg daily. Check potassium and renal function in one week. I will also check magnesium at that time.

## 2013-02-20 ENCOUNTER — Other Ambulatory Visit: Payer: Self-pay | Admitting: Cardiology

## 2013-02-20 LAB — CBC WITH DIFFERENTIAL/PLATELET
Basophils Relative: 1 % (ref 0–1)
Eosinophils Absolute: 0.1 10*3/uL (ref 0.0–0.7)
Eosinophils Relative: 2 % (ref 0–5)
HCT: 41.4 % (ref 39.0–52.0)
Hemoglobin: 14.6 g/dL (ref 13.0–17.0)
Lymphs Abs: 1 10*3/uL (ref 0.7–4.0)
MCH: 31.7 pg (ref 26.0–34.0)
MCHC: 35.3 g/dL (ref 30.0–36.0)
MCV: 89.8 fL (ref 78.0–100.0)
Monocytes Absolute: 0.6 10*3/uL (ref 0.1–1.0)
Monocytes Relative: 8 % (ref 3–12)

## 2013-02-20 LAB — BASIC METABOLIC PANEL
BUN: 23 mg/dL (ref 6–23)
CO2: 29 mEq/L (ref 19–32)
Calcium: 9.4 mg/dL (ref 8.4–10.5)
Chloride: 96 mEq/L (ref 96–112)
Glucose, Bld: 246 mg/dL — ABNORMAL HIGH (ref 70–99)

## 2013-02-21 ENCOUNTER — Ambulatory Visit (INDEPENDENT_AMBULATORY_CARE_PROVIDER_SITE_OTHER)
Admission: RE | Admit: 2013-02-21 | Discharge: 2013-02-21 | Disposition: A | Discharge status: Home / Self Care | Payer: Medicare Other | Source: Ambulatory Visit | Attending: Cardiology | Admitting: Cardiology

## 2013-02-21 DIAGNOSIS — I4891 Unspecified atrial fibrillation: Secondary | ICD-10-CM

## 2013-02-21 DIAGNOSIS — I719 Aortic aneurysm of unspecified site, without rupture: Secondary | ICD-10-CM

## 2013-02-21 DIAGNOSIS — R0989 Other specified symptoms and signs involving the circulatory and respiratory systems: Secondary | ICD-10-CM

## 2013-02-21 DIAGNOSIS — I509 Heart failure, unspecified: Secondary | ICD-10-CM

## 2013-02-21 HISTORY — DX: Type 2 diabetes mellitus without complications: E11.9

## 2013-02-22 ENCOUNTER — Ambulatory Visit (HOSPITAL_COMMUNITY): Payer: Medicare Other | Attending: Cardiology

## 2013-02-22 DIAGNOSIS — I10 Essential (primary) hypertension: Secondary | ICD-10-CM | POA: Insufficient documentation

## 2013-02-22 DIAGNOSIS — R0989 Other specified symptoms and signs involving the circulatory and respiratory systems: Secondary | ICD-10-CM | POA: Insufficient documentation

## 2013-02-22 DIAGNOSIS — I700 Atherosclerosis of aorta: Secondary | ICD-10-CM | POA: Insufficient documentation

## 2013-02-22 DIAGNOSIS — I714 Abdominal aortic aneurysm, without rupture: Secondary | ICD-10-CM

## 2013-02-22 DIAGNOSIS — I77819 Aortic ectasia, unspecified site: Secondary | ICD-10-CM | POA: Insufficient documentation

## 2013-02-22 DIAGNOSIS — I4891 Unspecified atrial fibrillation: Secondary | ICD-10-CM | POA: Insufficient documentation

## 2013-02-22 DIAGNOSIS — Z87891 Personal history of nicotine dependence: Secondary | ICD-10-CM | POA: Insufficient documentation

## 2013-02-23 ENCOUNTER — Telehealth: Payer: Self-pay | Admitting: Nurse Practitioner

## 2013-02-23 DIAGNOSIS — I428 Other cardiomyopathies: Secondary | ICD-10-CM

## 2013-02-23 DIAGNOSIS — I10 Essential (primary) hypertension: Secondary | ICD-10-CM

## 2013-02-23 DIAGNOSIS — I4891 Unspecified atrial fibrillation: Secondary | ICD-10-CM

## 2013-02-23 MED ORDER — MAGNESIUM OXIDE 250 MG PO TABS: 250.0000 mg | ORAL_TABLET | Freq: Two times a day (BID) | ORAL | Status: DC

## 2013-02-23 NOTE — Telephone Encounter (Signed)
Reviewed test and lab results with patient who verbalized understanding.  Patient scheduled for repeat Mg level 12/22, order in epic, and magnesium oxide 250 mg BID added to patient's medications.

## 2013-02-23 NOTE — Telephone Encounter (Signed)
Message copied by Levi Aland on Fri Feb 23, 2013  2:14 PM ------      Message from: Lewayne Bunting      Created: Fri Feb 23, 2013  6:54 AM       Hazle Coca'       ------

## 2013-02-27 ENCOUNTER — Ambulatory Visit (HOSPITAL_COMMUNITY): Payer: Medicare Other | Attending: Cardiology | Admitting: Radiology

## 2013-02-27 DIAGNOSIS — I447 Left bundle-branch block, unspecified: Secondary | ICD-10-CM | POA: Insufficient documentation

## 2013-02-27 DIAGNOSIS — I428 Other cardiomyopathies: Secondary | ICD-10-CM | POA: Insufficient documentation

## 2013-02-27 DIAGNOSIS — I4891 Unspecified atrial fibrillation: Secondary | ICD-10-CM | POA: Insufficient documentation

## 2013-02-27 DIAGNOSIS — I719 Aortic aneurysm of unspecified site, without rupture: Secondary | ICD-10-CM

## 2013-02-27 DIAGNOSIS — Z954 Presence of other heart-valve replacement: Secondary | ICD-10-CM | POA: Insufficient documentation

## 2013-02-27 DIAGNOSIS — R0989 Other specified symptoms and signs involving the circulatory and respiratory systems: Secondary | ICD-10-CM

## 2013-02-27 DIAGNOSIS — E669 Obesity, unspecified: Secondary | ICD-10-CM | POA: Insufficient documentation

## 2013-02-27 DIAGNOSIS — I359 Nonrheumatic aortic valve disorder, unspecified: Secondary | ICD-10-CM | POA: Insufficient documentation

## 2013-02-27 DIAGNOSIS — I509 Heart failure, unspecified: Secondary | ICD-10-CM

## 2013-02-27 DIAGNOSIS — Z87891 Personal history of nicotine dependence: Secondary | ICD-10-CM | POA: Insufficient documentation

## 2013-02-27 NOTE — Progress Notes (Signed)
Echocardiogram performed.  

## 2013-03-15 ENCOUNTER — Ambulatory Visit (INDEPENDENT_AMBULATORY_CARE_PROVIDER_SITE_OTHER): Payer: Medicare Other | Admitting: *Deleted

## 2013-03-15 DIAGNOSIS — Z95810 Presence of automatic (implantable) cardiac defibrillator: Secondary | ICD-10-CM

## 2013-03-15 DIAGNOSIS — I428 Other cardiomyopathies: Secondary | ICD-10-CM

## 2013-03-15 LAB — MDC_IDC_ENUM_SESS_TYPE_REMOTE
Brady Statistic RV Percent Paced: 91 %
Implantable Pulse Generator Serial Number: 485875
Lead Channel Impedance Value: 621 Ohm
Lead Channel Setting Pacing Pulse Width: 0.4 ms
Lead Channel Setting Pacing Pulse Width: 1.5 ms
Zone Setting Detection Interval: 250 ms
Zone Setting Detection Interval: 285.7 ms

## 2013-03-20 ENCOUNTER — Other Ambulatory Visit: Payer: Self-pay | Admitting: Internal Medicine

## 2013-03-23 ENCOUNTER — Encounter: Payer: Self-pay | Admitting: Internal Medicine

## 2013-03-26 ENCOUNTER — Other Ambulatory Visit: Payer: Medicare Other

## 2013-03-26 ENCOUNTER — Other Ambulatory Visit: Payer: Self-pay | Admitting: *Deleted

## 2013-03-26 DIAGNOSIS — I428 Other cardiomyopathies: Secondary | ICD-10-CM

## 2013-03-26 DIAGNOSIS — I4891 Unspecified atrial fibrillation: Secondary | ICD-10-CM

## 2013-03-26 DIAGNOSIS — I10 Essential (primary) hypertension: Secondary | ICD-10-CM

## 2013-03-26 LAB — MAGNESIUM: Magnesium: 1.9 mg/dL (ref 1.5–2.5)

## 2013-04-11 ENCOUNTER — Other Ambulatory Visit: Payer: Self-pay | Admitting: Cardiology

## 2013-04-13 ENCOUNTER — Other Ambulatory Visit: Payer: Self-pay

## 2013-04-13 MED ORDER — CARVEDILOL 25 MG PO TABS: ORAL_TABLET | ORAL | Status: DC

## 2013-04-18 ENCOUNTER — Other Ambulatory Visit: Payer: Self-pay | Admitting: Internal Medicine

## 2013-04-18 ENCOUNTER — Encounter: Payer: Self-pay | Admitting: *Deleted

## 2013-04-24 ENCOUNTER — Other Ambulatory Visit: Payer: Self-pay | Admitting: Internal Medicine

## 2013-04-25 ENCOUNTER — Other Ambulatory Visit: Payer: Self-pay | Admitting: *Deleted

## 2013-04-25 DIAGNOSIS — I509 Heart failure, unspecified: Secondary | ICD-10-CM

## 2013-04-25 DIAGNOSIS — I4891 Unspecified atrial fibrillation: Secondary | ICD-10-CM

## 2013-04-25 DIAGNOSIS — R0989 Other specified symptoms and signs involving the circulatory and respiratory systems: Secondary | ICD-10-CM

## 2013-04-25 DIAGNOSIS — I719 Aortic aneurysm of unspecified site, without rupture: Secondary | ICD-10-CM

## 2013-04-25 MED ORDER — FUROSEMIDE 40 MG PO TABS: ORAL_TABLET | ORAL | Status: DC

## 2013-04-26 ENCOUNTER — Telehealth: Payer: Self-pay | Admitting: *Deleted

## 2013-04-26 NOTE — Telephone Encounter (Signed)
Patient called to let us know that [employer] needs prior authorization on pradaxa. His id# is [id number]. He stated that he spoke to a representative from [employer] and they just need a call saying that the doctor still wants him on it and the prior-auth was already initiated. The number he was given for [place] to call is [phone]. Thanks, MI

## 2013-04-30 NOTE — Telephone Encounter (Signed)
Prior authorization for pradaxa from [employer] initiated, has to go through review since patient has a hx of aortic valve replacement

## 2013-05-07 NOTE — Telephone Encounter (Signed)
Patient states [employer] [facility] DENIED payment for the pradaxa due to the pt having hx of HEART valve replacement, will route to Dr Graciela Husbands to discuss with patient

## 2013-06-11 ENCOUNTER — Telehealth: Payer: Self-pay | Admitting: *Deleted

## 2013-06-11 NOTE — Telephone Encounter (Signed)
Medication reevaluation and EXCEPTION form sent to [employer] [facility] for patient PRADAXA.

## 2013-06-13 NOTE — Telephone Encounter (Signed)
[employer] sent another form for standard appeal for patient PRADAXA, they have been denying med due to pt has a bioprosthetic heart valve.

## 2013-06-14 ENCOUNTER — Encounter: Payer: Self-pay | Admitting: Internal Medicine

## 2013-06-14 ENCOUNTER — Ambulatory Visit (INDEPENDENT_AMBULATORY_CARE_PROVIDER_SITE_OTHER): Payer: Medicare Other | Admitting: *Deleted

## 2013-06-14 DIAGNOSIS — I428 Other cardiomyopathies: Secondary | ICD-10-CM

## 2013-06-14 DIAGNOSIS — Z95810 Presence of automatic (implantable) cardiac defibrillator: Secondary | ICD-10-CM

## 2013-06-26 ENCOUNTER — Telehealth: Payer: Self-pay | Admitting: *Deleted

## 2013-06-26 NOTE — Telephone Encounter (Signed)
Patient stated that he received a letter stating that his insurance is still not going to pay for the pradaxa even after the appeal was submitted. I offered samples but he has about a 30 day supply of pradaxa on hand right now. He is not willing to go on coumadin and he made that very clear. Thanks, MI

## 2013-06-28 LAB — MDC_IDC_ENUM_SESS_TYPE_REMOTE
Brady Statistic RA Percent Paced: 95 %
Brady Statistic RV Percent Paced: 91 %
HighPow Impedance: 52 Ohm
Implantable Pulse Generator Serial Number: 485875
Lead Channel Impedance Value: 621 Ohm
Lead Channel Impedance Value: 700 Ohm
Lead Channel Sensing Intrinsic Amplitude: 1.4 mV
Lead Channel Setting Pacing Amplitude: 2 V
Lead Channel Setting Pacing Amplitude: 2.4 V
Lead Channel Setting Pacing Amplitude: 2.4 V
Lead Channel Setting Pacing Pulse Width: 0.4 ms
Lead Channel Setting Pacing Pulse Width: 1.5 ms
MDC IDC MSMT LEADCHNL LV IMPEDANCE VALUE: 637 Ohm
MDC IDC MSMT LEADCHNL LV SENSING INTR AMPL: 25 mV — AB
MDC IDC MSMT LEADCHNL RV SENSING INTR AMPL: 25 mV — AB
MDC IDC SET ZONE DETECTION INTERVAL: 250 ms
MDC IDC SET ZONE DETECTION INTERVAL: 333.3 ms
Zone Setting Detection Interval: 285.7 ms

## 2013-07-03 NOTE — Telephone Encounter (Signed)
Spoke with pt, given xarelto and eliquis to check with his insurance to see if they will cover. He will call back and let me know

## 2013-07-03 NOTE — Telephone Encounter (Signed)
Will insurance cover any of [facility] agents? [name]

## 2013-07-03 NOTE — Telephone Encounter (Signed)
Patient returned your call, he stated that his insurance is not going to cover xarelto or eliquis either as they are all under the same tier. Thanks, MI

## 2013-07-05 NOTE — Telephone Encounter (Signed)
Pt given the number to [employer] [phone]. He has called and they are going to send him some paperwork to fill out and send back. He is going to bring to the [place] office the next time we are there.

## 2013-07-25 ENCOUNTER — Telehealth: Payer: Self-pay | Admitting: Cardiology

## 2013-07-25 MED ORDER — WARFARIN SODIUM 5 MG PO TABS: 5.0000 mg | ORAL_TABLET | Freq: Every day | ORAL | Status: DC

## 2013-07-25 NOTE — Telephone Encounter (Signed)
New message ° ° ° ° ° °Returning a nurses call °

## 2013-07-25 NOTE — Telephone Encounter (Signed)
Spoke with pt, he is not able to afford the new blood thinners, he wants to switch back to the coumadin. Discussed with jeremy, pharm md, pt instructed to take pradaxa and coumadin 5 mg together for two days. Then he will take coumadin 5 mg only and will see the [facility] clinic for protime in 5 days. Patient voiced understanding, follow up [facility] appt made.

## 2013-08-02 ENCOUNTER — Encounter: Payer: Self-pay | Admitting: *Deleted

## 2013-08-03 ENCOUNTER — Ambulatory Visit (INDEPENDENT_AMBULATORY_CARE_PROVIDER_SITE_OTHER): Payer: Medicare Other

## 2013-08-03 DIAGNOSIS — I4891 Unspecified atrial fibrillation: Secondary | ICD-10-CM

## 2013-08-03 DIAGNOSIS — Z5181 Encounter for therapeutic drug level monitoring: Secondary | ICD-10-CM

## 2013-08-03 LAB — POCT INR: INR: 2

## 2013-08-08 LAB — PROTIME-INR: INR: 2 — AB (ref 0.9–1.1)

## 2013-08-09 ENCOUNTER — Ambulatory Visit (INDEPENDENT_AMBULATORY_CARE_PROVIDER_SITE_OTHER): Payer: Medicare Other | Admitting: Cardiovascular Disease

## 2013-08-09 DIAGNOSIS — Z5181 Encounter for therapeutic drug level monitoring: Secondary | ICD-10-CM

## 2013-08-09 DIAGNOSIS — I4891 Unspecified atrial fibrillation: Secondary | ICD-10-CM

## 2013-08-16 ENCOUNTER — Ambulatory Visit (INDEPENDENT_AMBULATORY_CARE_PROVIDER_SITE_OTHER): Payer: Medicare Other | Admitting: *Deleted

## 2013-08-16 DIAGNOSIS — Z5181 Encounter for therapeutic drug level monitoring: Secondary | ICD-10-CM

## 2013-08-16 DIAGNOSIS — I4891 Unspecified atrial fibrillation: Secondary | ICD-10-CM

## 2013-08-16 LAB — POCT INR: INR: 2.3

## 2013-08-17 ENCOUNTER — Other Ambulatory Visit: Payer: Self-pay | Admitting: Internal Medicine

## 2013-08-23 ENCOUNTER — Ambulatory Visit (INDEPENDENT_AMBULATORY_CARE_PROVIDER_SITE_OTHER): Payer: Medicare Other | Admitting: Pharmacist Clinician (PhC)/ Clinical Pharmacy Specialist

## 2013-08-23 DIAGNOSIS — I4891 Unspecified atrial fibrillation: Secondary | ICD-10-CM

## 2013-08-23 DIAGNOSIS — Z5181 Encounter for therapeutic drug level monitoring: Secondary | ICD-10-CM

## 2013-08-23 LAB — POCT INR: INR: 2.2

## 2013-09-06 ENCOUNTER — Ambulatory Visit (INDEPENDENT_AMBULATORY_CARE_PROVIDER_SITE_OTHER): Payer: Medicare Other | Admitting: Pharmacist Clinician (PhC)/ Clinical Pharmacy Specialist

## 2013-09-06 DIAGNOSIS — I4891 Unspecified atrial fibrillation: Secondary | ICD-10-CM

## 2013-09-06 DIAGNOSIS — Z5181 Encounter for therapeutic drug level monitoring: Secondary | ICD-10-CM

## 2013-09-06 LAB — POCT INR: INR: 2.6

## 2013-09-24 ENCOUNTER — Encounter: Payer: Self-pay | Admitting: Internal Medicine

## 2013-09-24 ENCOUNTER — Ambulatory Visit (INDEPENDENT_AMBULATORY_CARE_PROVIDER_SITE_OTHER): Payer: Medicare Other | Admitting: Internal Medicine

## 2013-09-24 VITALS — BP 120/76 | HR 70 | Ht 70.0 in | Wt 225.0 lb

## 2013-09-24 DIAGNOSIS — I5022 Chronic systolic (congestive) heart failure: Secondary | ICD-10-CM

## 2013-09-24 DIAGNOSIS — Z95810 Presence of automatic (implantable) cardiac defibrillator: Secondary | ICD-10-CM

## 2013-09-24 DIAGNOSIS — I4891 Unspecified atrial fibrillation: Secondary | ICD-10-CM

## 2013-09-24 DIAGNOSIS — I428 Other cardiomyopathies: Secondary | ICD-10-CM

## 2013-09-24 LAB — MDC_IDC_ENUM_SESS_TYPE_INCLINIC
Brady Statistic RA Percent Paced: 95 %
Brady Statistic RV Percent Paced: 92 %
HIGH POWER IMPEDANCE MEASURED VALUE: 34 Ohm
HighPow Impedance: 59 Ohm
Implantable Pulse Generator Serial Number: 485875
Lead Channel Impedance Value: 616 Ohm
Lead Channel Impedance Value: 687 Ohm
Lead Channel Impedance Value: 735 Ohm
Lead Channel Pacing Threshold Amplitude: 0.9 V
Lead Channel Pacing Threshold Pulse Width: 0.4 ms
Lead Channel Pacing Threshold Pulse Width: 0.4 ms
Lead Channel Pacing Threshold Pulse Width: 1.5 ms
Lead Channel Sensing Intrinsic Amplitude: 1.8 mV
Lead Channel Setting Pacing Amplitude: 2.4 V
Lead Channel Setting Pacing Pulse Width: 0.4 ms
Lead Channel Setting Sensing Sensitivity: 0.5 mV
Lead Channel Setting Sensing Sensitivity: 1 mV
MDC IDC MSMT LEADCHNL LV PACING THRESHOLD AMPLITUDE: 1.4 V
MDC IDC MSMT LEADCHNL LV SENSING INTR AMPL: 24.1 mV
MDC IDC MSMT LEADCHNL RA PACING THRESHOLD AMPLITUDE: 0.8 V
MDC IDC MSMT LEADCHNL RV SENSING INTR AMPL: 25 mV — AB
MDC IDC SESS DTM: 20150622040000
MDC IDC SET LEADCHNL LV PACING PULSEWIDTH: 1.5 ms
MDC IDC SET LEADCHNL RA PACING AMPLITUDE: 2 V
MDC IDC SET LEADCHNL RV PACING AMPLITUDE: 2.4 V
MDC IDC SET ZONE DETECTION INTERVAL: 250 ms
Zone Setting Detection Interval: 286 ms
Zone Setting Detection Interval: 333 ms

## 2013-09-24 LAB — BASIC METABOLIC PANEL
BUN: 27 mg/dL — ABNORMAL HIGH (ref 6–23)
CALCIUM: 9.3 mg/dL (ref 8.4–10.5)
CO2: 29 mEq/L (ref 19–32)
CREATININE: 0.9 mg/dL (ref 0.4–1.5)
Chloride: 102 mEq/L (ref 96–112)
GFR: 84.45 mL/min (ref >60.00)
Glucose, Bld: 143 mg/dL — ABNORMAL HIGH (ref 70–99)
Potassium: 4.4 mEq/L (ref 3.5–5.1)
Sodium: 137 mEq/L (ref 135–145)

## 2013-09-24 LAB — MAGNESIUM: MAGNESIUM: 2.1 mg/dL (ref 1.5–2.5)

## 2013-09-24 NOTE — Progress Notes (Signed)
Patient Care Team: Gaspar Garbeichard W [name], MD as PCP - General   HPI  [name] is a 73 y.o. male Seen in followup for CRT-D implantation in the fall 2011. He also had a history of atrial flutter. In 2009 he underwent  porcine aortic valve replacement with 25 mm Carpentier-Edwards valve, replacement of ascending aortic aneurysm with 38-mm Hemishield graft, as well as Cox maze procedure for atrial fibrillation. He takes dofetilide.   Surveillance laboratories were normal Nov 2014  The patient denies chest pain, shortness of breath, nocturnal dyspnea, orthopnea or peripheral edema.  There have been no palpitations, lightheadedness or syncope.   He has a diaphragmatic stimulation since this device was implanted.   He had undergone cardiac catheterization in April of 2009>> s ejection fraction of 20%, no coronary disease,  Last echocardiogram performed 8/11 and showed Ejection fraction was 35%, moderate LAE, mild mitral regurgitation; prosthetic aortic valve with mean gradient of 10 mmHg.   Past Medical History  Diagnosis Date  . LBBB (left bundle branch block)   . Atrial fibrillation/flutter   . Hypertension   . Aortic stenosis/ bicuspid valve--s/p AVR   . Ascending aortic aneurysm   . DJD (degenerative joint disease)   . Biventricular implantable cardiac defibrillator- BSX   . OSA (obstructive sleep apnea)   . Cholelithiases   . Non-ischemic cardiomyopathy   . Diabetes mellitus without complication     Past Surgical History  Procedure Laterality Date  . Median sternotomy    . Aortic valve replacement    . Appendectomy    . Tonsillectomy    . Cervical discectomy  5/01    C7  . Cholecystectomy      Current Outpatient Prescriptions  Medication Sig Dispense Refill  . carvedilol (COREG) 25 MG tablet TAKE 1 TABLET BY MOUTH TWICE DAILY WITH A MEAL  60 tablet  6  . dofetilide (TIKOSYN) 250 MCG capsule TAKE 1 CAPSULE BY MOUTH 2 TIMES DAILY.  60 capsule  11  .  furosemide (LASIX) 40 MG tablet Take 1 tablet ( 40 mg ) daily and extra dose if needed  30 tablet  5  . lisinopril (PRINIVIL,ZESTRIL) 40 MG tablet Take 40 mg by mouth 2 (two) times daily.      . metFORMIN (GLUCOPHAGE) 500 MG tablet Take 1,000 mg by mouth daily with breakfast.       . spironolactone (ALDACTONE) 25 MG tablet Take 1 tablet (25 mg total) by mouth daily.  30 tablet  6  . warfarin (COUMADIN) 5 MG tablet Take 1 tablet (5 mg total) by mouth daily.  30 tablet  12   No current facility-administered medications for this visit.    No Known Allergies  Review of Systems negative except from HPI and PMH  Physical Exam BP 120/76  Pulse 70  Ht 5\' 10"  (1.778 m)  Wt 225 lb (102.059 kg)  BMI 32.28 kg/m2 Well developed and well nourished in no acute distress HENT normal E scleral and icterus clear Neck Supple JVP flat; carotids brisk and full Clear to ausculation  Regular rate and rhythm, 2/6 systolic murmur diminished S1 and present S4 Soft with active bowel sounds No clubbing cyanosis  Edema Alert and oriented, grossly normal motor and sensory function Skin Warm and Dry Visible diaphragmatic stimulation  ECG demonstrates AV pacing  Assessment and  Plan  Nonischemic cardiomyopathy  Aortic stenosis S./P. bioprosthetic replacement  Diaphragmatic stimulation  Implantable defibrillator-CRT-[employer]  The patient's device was  interrogated and the information was fully reviewed.  The device was reprogrammed to  shorten the AV delay and to eliminate diaphragmatic stimulation by creating a doctor from the LV tip to RV coil  Congestive heart.-Chronic-systolic  Diabetes mellitus-2  Atrial fibrillation-dofetilide   There have been no intercurrent atrial fibrillation. We will check his dofetilide surveillance laboratories  Given his heart failure we have reprogrammed his device as noted above he is otherwise euvolemic.  Given his diabetes as a coronary artery disease  equivalent, I recommend that he discuss with his PCP the role of statin therapy  Continue current occasions for his cardiomyopathy and check potassium levels today on his Aldactone

## 2013-09-24 NOTE — Patient Instructions (Signed)
Labs today: BMET, Magnesium  Your physician recommends that you continue on your current medications as directed. Please refer to the Current Medication list given to you today.  Remote monitoring is used to monitor your Pacemaker of ICD from home. This monitoring reduces the number of office visits required to check your device to one time per year. It allows [place] to keep an eye on the functioning of your device to ensure it is working properly. You are scheduled for a device check from home on 12/24/13. You may send your transmission at any time that day. If you have a wireless device, the transmission will be sent automatically. After your physician reviews your transmission, you will receive a postcard with your next transmission date.  Your physician wants you to follow-up in: 1 year with Dr. Graciela Husbands.  You will receive a reminder letter in the mail two months in advance. If you don't receive a letter, please call our office to schedule the follow-up appointment.

## 2013-09-26 ENCOUNTER — Other Ambulatory Visit: Payer: Self-pay | Admitting: Cardiology

## 2013-09-28 ENCOUNTER — Telehealth: Payer: Self-pay | Admitting: *Deleted

## 2013-09-28 NOTE — Telephone Encounter (Signed)
lmom with pt's full name on machine labs normal per Dr. Graciela Husbands. If any questions cb [phone] for Dr. Odessa Fleming RN to go over.

## 2013-10-04 ENCOUNTER — Ambulatory Visit (INDEPENDENT_AMBULATORY_CARE_PROVIDER_SITE_OTHER): Payer: Medicare Other | Admitting: Surgery

## 2013-10-04 DIAGNOSIS — I4891 Unspecified atrial fibrillation: Secondary | ICD-10-CM

## 2013-10-04 DIAGNOSIS — Z5181 Encounter for therapeutic drug level monitoring: Secondary | ICD-10-CM

## 2013-10-04 LAB — POCT INR: INR: 1.9

## 2013-10-18 ENCOUNTER — Ambulatory Visit (INDEPENDENT_AMBULATORY_CARE_PROVIDER_SITE_OTHER): Payer: Medicare Other | Admitting: *Deleted

## 2013-10-18 ENCOUNTER — Other Ambulatory Visit: Payer: Self-pay | Admitting: Internal Medicine

## 2013-10-18 DIAGNOSIS — Z5181 Encounter for therapeutic drug level monitoring: Secondary | ICD-10-CM

## 2013-10-18 DIAGNOSIS — I4891 Unspecified atrial fibrillation: Secondary | ICD-10-CM

## 2013-10-18 LAB — POCT INR: INR: 2

## 2013-11-01 ENCOUNTER — Other Ambulatory Visit: Payer: Self-pay | Admitting: Cardiology

## 2013-11-19 ENCOUNTER — Ambulatory Visit (INDEPENDENT_AMBULATORY_CARE_PROVIDER_SITE_OTHER): Payer: Medicare Other | Admitting: Pharmacist

## 2013-11-19 DIAGNOSIS — I4891 Unspecified atrial fibrillation: Secondary | ICD-10-CM

## 2013-11-19 DIAGNOSIS — Z5181 Encounter for therapeutic drug level monitoring: Secondary | ICD-10-CM

## 2013-11-19 LAB — POCT INR: INR: 2

## 2013-11-20 ENCOUNTER — Other Ambulatory Visit: Payer: Self-pay | Admitting: Cardiology

## 2013-12-17 ENCOUNTER — Ambulatory Visit (INDEPENDENT_AMBULATORY_CARE_PROVIDER_SITE_OTHER): Payer: Medicare Other

## 2013-12-17 DIAGNOSIS — Z5181 Encounter for therapeutic drug level monitoring: Secondary | ICD-10-CM

## 2013-12-17 DIAGNOSIS — I4891 Unspecified atrial fibrillation: Secondary | ICD-10-CM

## 2013-12-17 LAB — POCT INR: INR: 2

## 2013-12-24 ENCOUNTER — Telehealth: Payer: Self-pay | Admitting: Cardiology

## 2013-12-24 ENCOUNTER — Encounter: Payer: Medicare Other | Admitting: *Deleted

## 2013-12-24 NOTE — Telephone Encounter (Signed)
Spoke with pt and reminded pt of remote transmission that is due today. Pt verbalized understanding.   

## 2013-12-25 ENCOUNTER — Encounter: Payer: Self-pay | Admitting: Cardiology

## 2014-01-14 ENCOUNTER — Ambulatory Visit (INDEPENDENT_AMBULATORY_CARE_PROVIDER_SITE_OTHER): Payer: Medicare Other

## 2014-01-14 DIAGNOSIS — I4891 Unspecified atrial fibrillation: Secondary | ICD-10-CM

## 2014-01-14 DIAGNOSIS — Z5181 Encounter for therapeutic drug level monitoring: Secondary | ICD-10-CM

## 2014-01-14 LAB — POCT INR: INR: 1.2

## 2014-01-29 ENCOUNTER — Ambulatory Visit (INDEPENDENT_AMBULATORY_CARE_PROVIDER_SITE_OTHER): Payer: Medicare Other | Admitting: Pharmacist Clinician (PhC)/ Clinical Pharmacy Specialist

## 2014-01-29 DIAGNOSIS — Z5181 Encounter for therapeutic drug level monitoring: Secondary | ICD-10-CM

## 2014-01-29 DIAGNOSIS — I4891 Unspecified atrial fibrillation: Secondary | ICD-10-CM

## 2014-01-29 LAB — POCT INR: INR: 1.6

## 2014-02-04 ENCOUNTER — Other Ambulatory Visit: Payer: Self-pay | Admitting: Cardiology

## 2014-02-20 ENCOUNTER — Encounter: Payer: Self-pay | Admitting: Cardiology

## 2014-02-20 ENCOUNTER — Ambulatory Visit (INDEPENDENT_AMBULATORY_CARE_PROVIDER_SITE_OTHER): Payer: Medicare Other | Admitting: *Deleted

## 2014-02-20 ENCOUNTER — Ambulatory Visit (INDEPENDENT_AMBULATORY_CARE_PROVIDER_SITE_OTHER): Payer: Medicare Other | Admitting: Cardiology

## 2014-02-20 ENCOUNTER — Other Ambulatory Visit: Payer: Self-pay | Admitting: Cardiology

## 2014-02-20 VITALS — BP 130/80 | HR 70 | Ht 70.0 in | Wt 231.8 lb

## 2014-02-20 DIAGNOSIS — Z5181 Encounter for therapeutic drug level monitoring: Secondary | ICD-10-CM

## 2014-02-20 DIAGNOSIS — I714 Abdominal aortic aneurysm, without rupture, unspecified: Secondary | ICD-10-CM | POA: Insufficient documentation

## 2014-02-20 DIAGNOSIS — Z95810 Presence of automatic (implantable) cardiac defibrillator: Secondary | ICD-10-CM

## 2014-02-20 DIAGNOSIS — I4891 Unspecified atrial fibrillation: Secondary | ICD-10-CM

## 2014-02-20 LAB — BASIC METABOLIC PANEL WITH GFR
BUN: 24 mg/dL — ABNORMAL HIGH (ref 6–23)
CALCIUM: 9.5 mg/dL (ref 8.4–10.5)
CHLORIDE: 101 meq/L (ref 96–112)
CO2: 25 meq/L (ref 19–32)
Creat: 1.08 mg/dL (ref 0.50–1.35)
GFR, Est African American: 78 mL/min
GFR, Est Non African American: 67 mL/min
GLUCOSE: 256 mg/dL — AB (ref 70–99)
POTASSIUM: 4.6 meq/L (ref 3.5–5.3)
SODIUM: 136 meq/L (ref 135–145)

## 2014-02-20 LAB — CBC
HEMATOCRIT: 39.7 % (ref 39.0–52.0)
HEMOGLOBIN: 13.2 g/dL (ref 13.0–17.0)
MCH: 29.8 pg (ref 26.0–34.0)
MCHC: 33.2 g/dL (ref 30.0–36.0)
MCV: 89.6 fL (ref 78.0–100.0)
MPV: 10.9 fL (ref 9.4–12.4)
Platelets: 187 10*3/uL (ref 150–400)
RBC: 4.43 MIL/uL (ref 4.22–5.81)
RDW: 13.3 % (ref 11.5–15.5)
WBC: 6.1 10*3/uL (ref 4.0–10.5)

## 2014-02-20 LAB — POCT INR: INR: 2.6

## 2014-02-20 LAB — MAGNESIUM: MAGNESIUM: 1.9 mg/dL (ref 1.5–2.5)

## 2014-02-20 NOTE — Assessment & Plan Note (Signed)
Recent CT as outlined in history of present illness. Previous repair stable.

## 2014-02-20 NOTE — Assessment & Plan Note (Signed)
Continue SBE prophylaxis. 

## 2014-02-20 NOTE — Telephone Encounter (Signed)
Rx refill sent to patient pharmacy   

## 2014-02-20 NOTE — Progress Notes (Signed)
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 Note a cardiac catheterization in April of 2009 prior to his procedure showed an ejection fraction of 20%, no coronary disease, a dilated aortic root, moderate aortic stenosis and mild mitral regurgitation. The patient subsequently had aortic valve replacement with 25 mm Carpentier-Edwards valve, replacement of ascending aortic aneurysm with 38-mm Hemishield graft, modified Cox maze III. Note he did develop atrial fibrillation 6 months after his procedure. A cardioversion was attempted by his report but he only held sinus for one week. Patient had CRT-D on December 2 of 2011. He was placed on tikosyn for his atrial fibrillation/flutter. His dose was decreased because of increasing QT. Last echocardiogram in November 2014 showed an ejection fraction of 50-55%, grade 2 diastolic dysfunction, bioprosthetic aortic valve with a mean gradient of 16 mmHg and mild left atrial enlargement. There was mild right atrial enlargement. Abdominal ultrasound November 2014 showed mild dilatation of the abdominal aorta. Follow-up recommended 1 year. CTA of the thoracic aorta in November 2014 showed normal patency of the aortic root graft with no aneurysmal disease of the rest of the thoracic aorta. Since I last saw him, the patient has dyspnea with more extreme activities but not with routine activities. It is relieved with rest. It is not associated with chest pain. There is no orthopnea, PND or pedal edema. There is no syncope or palpitations. There is no exertional chest pain.   Current Outpatient Prescriptions  Medication Sig Dispense Refill  . carvedilol (COREG) 25 MG tablet TAKE 1 TABLET BY MOUTH TWICE A DAY WITH A MEAL. 60 tablet 2  . furosemide (LASIX) 40 MG tablet Take 1 tablet ( 40 mg ) daily and extra dose if needed 30 tablet 5  . lisinopril  (PRINIVIL,ZESTRIL) 40 MG tablet Take 40 mg by mouth 2 (two) times daily.    . metFORMIN (GLUCOPHAGE) 500 MG tablet Take 1,000 mg by mouth daily with breakfast.     . spironolactone (ALDACTONE) 25 MG tablet TAKE 1 TABLET (25 MG) BY MOUTH DAILY. 30 tablet 2  . TIKOSYN 250 MCG capsule TAKE 1 CAPSULE BY MOUTH 2 TIMES DAILY. 60 capsule 5  . warfarin (COUMADIN) 5 MG tablet Take 1 tablet (5 mg total) by mouth daily. 30 tablet 12   No current facility-administered medications for this visit.     Past Medical History  Diagnosis Date  . LBBB (left bundle branch block)   . Atrial fibrillation/flutter   . Hypertension   . Aortic stenosis/ bicuspid valve--s/p AVR   . Ascending aortic aneurysm   . DJD (degenerative joint disease)   . Biventricular implantable cardiac defibrillator- BSX   . OSA (obstructive sleep apnea)   . Cholelithiases   . Non-ischemic cardiomyopathy   . Diabetes mellitus without complication     Past Surgical History  Procedure Laterality Date  . Median sternotomy    . Aortic valve replacement    . Appendectomy    . Tonsillectomy    . Cervical discectomy  5/01    C7  . Cholecystectomy      History   Social History  . Marital Status: Married    Spouse Name: N/A    Number of Children: 2  . Years of Education: N/A   Occupational History  . retired Art gallery managerengineer    Social History Main Topics  . Smoking status: Former Games developermoker  .  Smokeless tobacco: Not on file  . Alcohol Use: No  . Drug Use: Not on file  . Sexual Activity: Not on file   Other Topics Concern  . Not on file   Social History Narrative    ROS: no fevers or chills, productive cough, hemoptysis, dysphasia, odynophagia, melena, hematochezia, dysuria, hematuria, rash, seizure activity, orthopnea, PND, claudication. Remaining systems are negative.  Physical Exam: Well-developed obese in no acute distress.  Skin is warm and dry.  HEENT is normal.  Neck is supple.  Chest is clear to auscultation with  normal expansion.  Cardiovascular exam is regular rate and rhythm. 2/6 systolic murmur Abdominal exam nontender or distended. No masses palpated. Extremities show 1+ ankle edema. neuro grossly intact  ECG Ventricular paced rhythm

## 2014-02-20 NOTE — Assessment & Plan Note (Signed)
Repeat abdominal ultrasound. 

## 2014-02-20 NOTE — Assessment & Plan Note (Signed)
Blood pressure controlled. Continue present medications. Check potassium and renal function. 

## 2014-02-20 NOTE — Assessment & Plan Note (Signed)
Continue Coumadin, carvedilol and tikosyn. Check potassium, magnesium and hemoglobin.

## 2014-02-20 NOTE — Assessment & Plan Note (Signed)
Followed by electrophysiology. 

## 2014-02-20 NOTE — Assessment & Plan Note (Signed)
LV function improved on most recent echocardiogram. 

## 2014-02-20 NOTE — Patient Instructions (Signed)
Your physician wants you to follow-up in: ONE YEAR WITH DR CRENSHAW. You will receive a reminder letter in the mail two months in advance. If you don't receive a letter, please call our office to schedule the follow-up appointment.  Your physician recommends that you HAVE LAB WORK TODAY.  Your physician has requested that you have an abdominal aorta duplex. During this test, an ultrasound is used to evaluate the aorta. Allow 30 minutes for this exam. Do not eat after midnight the day before and avoid carbonated beverages    

## 2014-02-21 ENCOUNTER — Ambulatory Visit (HOSPITAL_BASED_OUTPATIENT_CLINIC_OR_DEPARTMENT_OTHER)
Admission: RE | Admit: 2014-02-21 | Discharge: 2014-02-21 | Disposition: A | Discharge status: Home / Self Care | Payer: Medicare Other | Source: Ambulatory Visit | Attending: Cardiology | Admitting: Cardiology

## 2014-02-21 DIAGNOSIS — I714 Abdominal aortic aneurysm, without rupture, unspecified: Secondary | ICD-10-CM

## 2014-02-21 DIAGNOSIS — I77811 Abdominal aortic ectasia: Secondary | ICD-10-CM | POA: Insufficient documentation

## 2014-02-21 DIAGNOSIS — I712 Thoracic aortic aneurysm, without rupture: Secondary | ICD-10-CM | POA: Diagnosis present

## 2014-03-13 ENCOUNTER — Ambulatory Visit (INDEPENDENT_AMBULATORY_CARE_PROVIDER_SITE_OTHER): Payer: Medicare Other | Admitting: *Deleted

## 2014-03-13 DIAGNOSIS — Z5181 Encounter for therapeutic drug level monitoring: Secondary | ICD-10-CM

## 2014-03-13 DIAGNOSIS — I4891 Unspecified atrial fibrillation: Secondary | ICD-10-CM

## 2014-03-13 LAB — POCT INR: INR: 1.9

## 2014-04-03 ENCOUNTER — Ambulatory Visit (INDEPENDENT_AMBULATORY_CARE_PROVIDER_SITE_OTHER): Payer: Medicare Other | Admitting: *Deleted

## 2014-04-03 DIAGNOSIS — Z5181 Encounter for therapeutic drug level monitoring: Secondary | ICD-10-CM

## 2014-04-03 DIAGNOSIS — I4891 Unspecified atrial fibrillation: Secondary | ICD-10-CM

## 2014-04-03 LAB — POCT INR: INR: 2.2

## 2014-05-01 ENCOUNTER — Ambulatory Visit (INDEPENDENT_AMBULATORY_CARE_PROVIDER_SITE_OTHER): Payer: Medicare Other | Admitting: *Deleted

## 2014-05-01 DIAGNOSIS — Z5181 Encounter for therapeutic drug level monitoring: Secondary | ICD-10-CM

## 2014-05-01 DIAGNOSIS — I4891 Unspecified atrial fibrillation: Secondary | ICD-10-CM

## 2014-05-01 LAB — POCT INR: INR: 1.6

## 2014-05-06 ENCOUNTER — Other Ambulatory Visit: Payer: Self-pay | Admitting: Cardiology

## 2014-05-06 ENCOUNTER — Other Ambulatory Visit: Payer: Self-pay | Admitting: Internal Medicine

## 2014-05-15 ENCOUNTER — Ambulatory Visit (INDEPENDENT_AMBULATORY_CARE_PROVIDER_SITE_OTHER): Payer: Medicare Other | Admitting: *Deleted

## 2014-05-15 DIAGNOSIS — Z5181 Encounter for therapeutic drug level monitoring: Secondary | ICD-10-CM

## 2014-05-15 DIAGNOSIS — I4891 Unspecified atrial fibrillation: Secondary | ICD-10-CM

## 2014-05-15 LAB — POCT INR: INR: 2.2

## 2014-05-27 ENCOUNTER — Other Ambulatory Visit (HOSPITAL_COMMUNITY): Payer: Self-pay | Admitting: Cardiology

## 2014-05-27 DIAGNOSIS — I714 Abdominal aortic aneurysm, without rupture, unspecified: Secondary | ICD-10-CM

## 2014-06-05 ENCOUNTER — Ambulatory Visit (INDEPENDENT_AMBULATORY_CARE_PROVIDER_SITE_OTHER): Payer: Medicare Other | Admitting: *Deleted

## 2014-06-05 ENCOUNTER — Ambulatory Visit (HOSPITAL_COMMUNITY): Payer: Medicare Other

## 2014-06-05 DIAGNOSIS — Z5181 Encounter for therapeutic drug level monitoring: Secondary | ICD-10-CM

## 2014-06-05 DIAGNOSIS — I4891 Unspecified atrial fibrillation: Secondary | ICD-10-CM

## 2014-06-05 LAB — POCT INR: INR: 1.5

## 2014-06-12 ENCOUNTER — Ambulatory Visit (INDEPENDENT_AMBULATORY_CARE_PROVIDER_SITE_OTHER): Payer: Medicare Other | Admitting: Podiatry

## 2014-06-12 ENCOUNTER — Encounter: Payer: Self-pay | Admitting: Podiatry

## 2014-06-12 VITALS — BP 112/64 | HR 77 | Resp 18

## 2014-06-12 DIAGNOSIS — B351 Tinea unguium: Secondary | ICD-10-CM

## 2014-06-12 DIAGNOSIS — M79676 Pain in unspecified toe(s): Secondary | ICD-10-CM | POA: Diagnosis not present

## 2014-06-12 NOTE — Progress Notes (Signed)
   Subjective:    Patient ID: [name], male    DOB: [birth year], 74 y.o.   MRN: [id number]  HPI  74 year old male presents the office today with complaints of thick, discolored toenails. He states that he has tenderness of the nails as they are elongated and he is unable to trim them himself. He is tried multiple over-the-counter treatments for nail fungus. He denies any redness or drainage on the nail sites. He has diabetic and states his blood sugars typically run between 200-300. He denies any history of ulceration, 2 L numbness or any claudication symptoms. No other complaints at this time.   Review of Systems  All other systems reviewed and are negative.      Objective:   Physical Exam AAO 3, NAD DP/PT pulses palpable, CRT less than 3 seconds Protective sensation intact with Simms was a monopolar, vibratory sensation intact, Achilles tendon reflex intact.  Nails are hypertrophic, dystrophic, elongated, brittle, discolored 10. There subjective tenderness along nails 1-5 bilaterally. No surrounding erythema or drainage. No open lesions or pre-ulcerative lesions bilaterally. No interdigital maceration. No other areas of tenderness to bilateral lower extremity. No overlying edema, erythema, increase in warmth. MMT 5/5, ROM WNL No pain with calf compression, swelling, warmth, erythema.     Assessment & Plan:  74 year old male with symptomatic onychomycosis -Treatment options discussed with the patient include alternatives, risks, complications. -Nail sharply debrided 10 without complication/bleeding. -Discussed various treatment options for onychomycosis. Would hold off on oral medications however did discuss topical occasions. He is also inquiring about laser. He was given information on this. States that he will consider his options and call the office if he desires further treatment. -Discussed daily foot inspection. -Follow-up in 3 months or sooner if any palms are to arise.  In the meantime encouraged to call the office with any questions, concerns, change in symptoms.

## 2014-06-12 NOTE — Patient Instructions (Signed)
Onychomycosis/Fungal Toenails  WHAT IS IT? An infection that lies within the keratin of your nail plate that is caused by a fungus.  WHY ME? Fungal infections affect all ages, sexes, races, and creeds.  There may be many factors that predispose you to a fungal infection such as age, coexisting medical conditions such as diabetes, or an autoimmune disease; stress, medications, fatigue, genetics, etc.  Bottom line: fungus thrives in a warm, moist environment and your shoes offer such a location.  IS IT CONTAGIOUS? Theoretically, yes.  You do not want to share shoes, nail clippers or files with someone who has fungal toenails.  Walking around barefoot in the same room or sleeping in the same bed is unlikely to transfer the organism.  It is important to realize, however, that fungus can spread easily from one nail to the next on the same foot.  HOW DO WE TREAT THIS?  There are several ways to treat this condition.  Treatment may depend on many factors such as age, medications, pregnancy, liver and kidney conditions, etc.  It is best to ask your doctor which options are available to you.  1. No treatment.   Unlike many other medical concerns, you can live with this condition.  However for many people this can be a painful condition and may lead to ingrown toenails or a bacterial infection.  It is recommended that you keep the nails cut short to help reduce the amount of fungal nail. 2. Topical treatment.  These range from herbal remedies to prescription strength nail lacquers.  About 40-50% effective, topicals require twice daily application for approximately 9 to 12 months or until an entirely new nail has grown out.  The most effective topicals are medical grade medications available through physicians offices. 3. Oral antifungal medications.  With an 80-90% cure rate, the most common oral medication requires 3 to 4 months of therapy and stays in your system for a year as the new nail grows out.  Oral  antifungal medications do require blood work to make sure it is a safe drug for you.  A liver function panel will be performed prior to starting the medication and after the first month of treatment.  It is important to have the blood work performed to avoid any harmful side effects.  In general, this medication safe but blood work is required. 4. Laser Therapy.  This treatment is performed by applying a specialized laser to the affected nail plate.  This therapy is noninvasive, fast, and non-painful.  It is not covered by insurance and is therefore, out of pocket.  The results have been very good with a 80-95% cure rate.  [facility] is the only practice in the area to offer this therapy. Permanent Nail Avulsion.  Removing the entire nail so that a new nail will not grow back.Diabetes and Foot Care Diabetes may cause you to have problems because of poor blood supply (circulation) to your feet and legs. This may cause the skin on your feet to become thinner, break easier, and heal more slowly. Your skin may become dry, and the skin may peel and crack. You may also have nerve damage in your legs and feet causing decreased feeling in them. You may not notice minor injuries to your feet that could lead to infections or more serious problems. Taking care of your feet is one of the most important things you can do for yourself.  HOME CARE INSTRUCTIONS 5. Wear shoes at all times,   even in the house. Do not go barefoot. Bare feet are easily injured. 6. Check your feet daily for blisters, cuts, and redness. If you cannot see the bottom of your feet, use a mirror or ask someone for help. 7. Wash your feet with warm water (do not use hot water) and mild soap. Then pat your feet and the areas between your toes until they are completely dry. Do not soak your feet as this can dry your skin. 8. Apply a moisturizing lotion or petroleum jelly (that does not contain alcohol and is unscented) to the skin on your feet  and to dry, brittle toenails. Do not apply lotion between your toes. 9. Trim your toenails straight across. Do not dig under them or around the cuticle. File the edges of your nails with an emery board or nail file. 10. Do not cut corns or calluses or try to remove them with medicine. 11. Wear clean socks or stockings every day. Make sure they are not too tight. Do not wear knee-high stockings since they may decrease blood flow to your legs. 12. Wear shoes that fit properly and have enough cushioning. To break in new shoes, wear them for just a few hours a day. This prevents you from injuring your feet. Always look in your shoes before you put them on to be sure there are no objects inside. 13. Do not cross your legs. This may decrease the blood flow to your feet. 14. If you find a minor scrape, cut, or break in the skin on your feet, keep it and the skin around it clean and dry. These areas may be cleansed with mild soap and water. Do not cleanse the area with peroxide, alcohol, or iodine. 15. When you remove an adhesive bandage, be sure not to damage the skin around it. 16. If you have a wound, look at it several times a day to make sure it is healing. 17. Do not use heating pads or hot water bottles. They may burn your skin. If you have lost feeling in your feet or legs, you may not know it is happening until it is too late. 18. Make sure your health care provider performs a complete foot exam at least annually or more often if you have foot problems. Report any cuts, sores, or bruises to your health care provider immediately. SEEK MEDICAL CARE IF:   You have an injury that is not healing.  You have cuts or breaks in the skin.  You have an ingrown nail.  You notice redness on your legs or feet.  You feel burning or tingling in your legs or feet.  You have pain or cramps in your legs and feet.  Your legs or feet are numb.  Your feet always feel cold. SEEK IMMEDIATE MEDICAL CARE IF:    There is increasing redness, swelling, or pain in or around a wound.  There is a red line that goes up your leg.  Pus is coming from a wound.  You develop a fever or as directed by your health care provider.  You notice a bad smell coming from an ulcer or wound. Document Released: 03/19/2000 Document Revised: 11/22/2012 Document Reviewed: 08/29/2012 ExitCare Patient Information 2015 [employer]. This information is not intended to replace advice given to you by your health care provider. Make sure you discuss any questions you have with your health care provider.  

## 2014-06-19 ENCOUNTER — Ambulatory Visit (INDEPENDENT_AMBULATORY_CARE_PROVIDER_SITE_OTHER): Payer: Medicare Other | Admitting: *Deleted

## 2014-06-19 DIAGNOSIS — Z5181 Encounter for therapeutic drug level monitoring: Secondary | ICD-10-CM

## 2014-06-19 DIAGNOSIS — I4891 Unspecified atrial fibrillation: Secondary | ICD-10-CM

## 2014-06-19 LAB — POCT INR: INR: 1.7

## 2014-06-19 MED ORDER — WARFARIN SODIUM 5 MG PO TABS: ORAL_TABLET | ORAL | Status: DC

## 2014-07-08 ENCOUNTER — Ambulatory Visit (INDEPENDENT_AMBULATORY_CARE_PROVIDER_SITE_OTHER): Payer: Medicare Other

## 2014-07-08 DIAGNOSIS — Z5181 Encounter for therapeutic drug level monitoring: Secondary | ICD-10-CM | POA: Diagnosis not present

## 2014-07-08 DIAGNOSIS — I4891 Unspecified atrial fibrillation: Secondary | ICD-10-CM | POA: Diagnosis not present

## 2014-07-08 LAB — POCT INR: INR: 2

## 2014-07-29 ENCOUNTER — Ambulatory Visit (INDEPENDENT_AMBULATORY_CARE_PROVIDER_SITE_OTHER): Payer: Medicare Other | Admitting: *Deleted

## 2014-07-29 DIAGNOSIS — I4891 Unspecified atrial fibrillation: Secondary | ICD-10-CM | POA: Diagnosis not present

## 2014-07-29 DIAGNOSIS — Z5181 Encounter for therapeutic drug level monitoring: Secondary | ICD-10-CM | POA: Diagnosis not present

## 2014-07-29 LAB — POCT INR: INR: 2

## 2014-08-06 ENCOUNTER — Encounter: Payer: Self-pay | Admitting: Internal Medicine

## 2014-08-26 ENCOUNTER — Ambulatory Visit (INDEPENDENT_AMBULATORY_CARE_PROVIDER_SITE_OTHER): Payer: Medicare Other

## 2014-08-26 DIAGNOSIS — Z5181 Encounter for therapeutic drug level monitoring: Secondary | ICD-10-CM

## 2014-08-26 DIAGNOSIS — I4891 Unspecified atrial fibrillation: Secondary | ICD-10-CM | POA: Diagnosis not present

## 2014-08-26 LAB — POCT INR: INR: 1.8

## 2014-08-27 ENCOUNTER — Other Ambulatory Visit: Payer: Self-pay | Admitting: Cardiology

## 2014-09-09 ENCOUNTER — Other Ambulatory Visit: Payer: Self-pay | Admitting: Cardiology

## 2014-09-09 ENCOUNTER — Ambulatory Visit (INDEPENDENT_AMBULATORY_CARE_PROVIDER_SITE_OTHER): Payer: Medicare Other | Admitting: *Deleted

## 2014-09-09 DIAGNOSIS — I429 Cardiomyopathy, unspecified: Secondary | ICD-10-CM | POA: Diagnosis not present

## 2014-09-09 DIAGNOSIS — I428 Other cardiomyopathies: Secondary | ICD-10-CM

## 2014-09-13 ENCOUNTER — Encounter: Payer: Self-pay | Admitting: Podiatry

## 2014-09-13 ENCOUNTER — Ambulatory Visit (INDEPENDENT_AMBULATORY_CARE_PROVIDER_SITE_OTHER): Payer: Medicare Other | Admitting: Podiatry

## 2014-09-13 VITALS — BP 93/59 | HR 70 | Resp 18

## 2014-09-13 DIAGNOSIS — M79676 Pain in unspecified toe(s): Secondary | ICD-10-CM | POA: Diagnosis not present

## 2014-09-13 DIAGNOSIS — B351 Tinea unguium: Secondary | ICD-10-CM | POA: Diagnosis not present

## 2014-09-13 LAB — CUP PACEART REMOTE DEVICE CHECK
Battery Remaining Percentage: 100 %
Date Time Interrogation Session: 20160606081800
HIGH POWER IMPEDANCE MEASURED VALUE: 54 Ohm
Lead Channel Impedance Value: 1014 Ohm
Lead Channel Pacing Threshold Amplitude: 1.3 V
Lead Channel Pacing Threshold Pulse Width: 0.4 ms
Lead Channel Pacing Threshold Pulse Width: 0.4 ms
Lead Channel Pacing Threshold Pulse Width: 1.5 ms
Lead Channel Setting Pacing Amplitude: 2 V
Lead Channel Setting Pacing Amplitude: 2 V
Lead Channel Setting Pacing Amplitude: 2.4 V
Lead Channel Setting Pacing Pulse Width: 0.4 ms
Lead Channel Setting Pacing Pulse Width: 1.5 ms
Lead Channel Setting Sensing Sensitivity: 0.5 mV
Lead Channel Setting Sensing Sensitivity: 1 mV
MDC IDC MSMT BATTERY REMAINING LONGEVITY: 66 mo
MDC IDC MSMT LEADCHNL RA IMPEDANCE VALUE: 644 Ohm
MDC IDC MSMT LEADCHNL RA PACING THRESHOLD AMPLITUDE: 0.8 V
MDC IDC MSMT LEADCHNL RV IMPEDANCE VALUE: 762 Ohm
MDC IDC MSMT LEADCHNL RV PACING THRESHOLD AMPLITUDE: 0.9 V
MDC IDC SET ZONE DETECTION INTERVAL: 333 ms
MDC IDC STAT BRADY RA PERCENT PACED: 90 %
MDC IDC STAT BRADY RV PERCENT PACED: 86 %
Pulse Gen Serial Number: 485875
Zone Setting Detection Interval: 250 ms
Zone Setting Detection Interval: 286 ms

## 2014-09-13 NOTE — Progress Notes (Signed)
Remote ICD transmission.   

## 2014-09-13 NOTE — Progress Notes (Signed)
Patient ID: [name], male   DOB: 06/06/1940, 74 y.o.   MRN: [id number]  Subjective: 74 y.o.-year-old male returns the office today for painful, elongated, thickened toenails. Denies any redness or drainage around the nails. He is also inquiring again about laser. Denies any acute changes since last appointment and no new complaints today. Denies any systemic complaints such as fevers, chills, nausea, vomiting.   Objective: AAO 3, NAD DP/PT pulses palpable, CRT less than 3 seconds Protective sensation intact with Simms Weinstein monofilament, Achilles tendon reflex intact.  Nails hypertrophic, dystrophic, elongated, brittle, discolored 10. There is tenderness overlying the nails 1-5 bilaterally. There is no surrounding erythema or drainage along the nail sites. No open lesions or pre-ulcerative lesions are identified. No other areas of tenderness bilateral lower extremities. No overlying edema, erythema, increased warmth. No pain with calf compression, swelling, warmth, erythema.  Assessment: Patient presents with symptomatic onychomycosis  Plan: -Treatment options including alternatives, risks, complications were discussed -Nails sharply debrided 10 without complication/bleeding. -Nails were biopsied and sent to [facility] labs for evaluation of onychomycosis to assure the laser treatment would be appropriate. -Discussed daily foot inspection. If there are any changes, to call the office immediately.  -Follow-up in 3 months or after nail biopsy results to discuss laser, or sooner if any problems are to arise. In the meantime, encouraged to call the office with any questions, concerns, changes symptoms.

## 2014-09-16 NOTE — Addendum Note (Signed)
Addended by: Hadley Pen R on: 09/16/2014 08:22 AM   Modules accepted: Orders

## 2014-09-23 ENCOUNTER — Ambulatory Visit (INDEPENDENT_AMBULATORY_CARE_PROVIDER_SITE_OTHER): Payer: Medicare Other | Admitting: *Deleted

## 2014-09-23 DIAGNOSIS — Z5181 Encounter for therapeutic drug level monitoring: Secondary | ICD-10-CM

## 2014-09-23 DIAGNOSIS — I4891 Unspecified atrial fibrillation: Secondary | ICD-10-CM

## 2014-09-23 LAB — POCT INR: INR: 2.3

## 2014-09-25 ENCOUNTER — Encounter: Payer: Self-pay | Admitting: Cardiology

## 2014-09-30 ENCOUNTER — Encounter: Payer: Self-pay | Admitting: Internal Medicine

## 2014-10-15 ENCOUNTER — Encounter: Payer: Medicare Other | Admitting: Internal Medicine

## 2014-10-17 ENCOUNTER — Telehealth: Payer: Self-pay | Admitting: Cardiology

## 2014-10-17 NOTE — Telephone Encounter (Signed)
New message    Pt c/o medication issue:  1. Name of Medication: Antibotic  2. How are you currently taking this medication (dosage and times per day)? No  3. Are you having a reaction (difficulty breathing--STAT)? No  4. What is your medication issue? Needs an antibotic before having dental work on Monday July 18th.  Needs the antibotic by tomorrow.   Please call to discuss

## 2014-10-17 NOTE — Telephone Encounter (Signed)
Routed to MD to review and advise on SBE prophylaxis

## 2014-10-18 MED ORDER — AMOXICILLIN 500 MG PO CAPS: 2000.0000 mg | ORAL_CAPSULE | Freq: Once | ORAL | Status: DC | PRN

## 2014-10-18 NOTE — Telephone Encounter (Signed)
PER dr Jens Som , h/o AVR- need SBE prophylaxis Spoke to wife  ANTIBIOTIC IS NEEDED PRIOR TO DENTAL WORK RX SENT TO PHARMACY- amoxicillin 500 mg (2,000 mg) 4 capsules) one hour prior to procedures. 3 refills

## 2014-10-21 ENCOUNTER — Telehealth: Payer: Self-pay | Admitting: *Deleted

## 2014-10-21 NOTE — Telephone Encounter (Signed)
Dr. Ardelle Anton reviewed results as positive and request pt make an appt to discuss results and treatment.

## 2014-10-24 ENCOUNTER — Encounter: Payer: Self-pay | Admitting: *Deleted

## 2014-10-24 ENCOUNTER — Encounter: Payer: Self-pay | Admitting: Podiatry

## 2014-11-01 ENCOUNTER — Ambulatory Visit (INDEPENDENT_AMBULATORY_CARE_PROVIDER_SITE_OTHER): Payer: Medicare Other | Admitting: Internal Medicine

## 2014-11-01 ENCOUNTER — Ambulatory Visit (INDEPENDENT_AMBULATORY_CARE_PROVIDER_SITE_OTHER): Payer: Medicare Other | Admitting: Pharmacist

## 2014-11-01 ENCOUNTER — Encounter: Payer: Self-pay | Admitting: Internal Medicine

## 2014-11-01 VITALS — BP 90/60 | HR 70 | Ht 70.0 in | Wt 224.4 lb

## 2014-11-01 DIAGNOSIS — I429 Cardiomyopathy, unspecified: Secondary | ICD-10-CM | POA: Diagnosis not present

## 2014-11-01 DIAGNOSIS — Z95810 Presence of automatic (implantable) cardiac defibrillator: Secondary | ICD-10-CM

## 2014-11-01 DIAGNOSIS — Z5181 Encounter for therapeutic drug level monitoring: Secondary | ICD-10-CM | POA: Diagnosis not present

## 2014-11-01 DIAGNOSIS — I5022 Chronic systolic (congestive) heart failure: Secondary | ICD-10-CM | POA: Diagnosis not present

## 2014-11-01 DIAGNOSIS — I4891 Unspecified atrial fibrillation: Secondary | ICD-10-CM | POA: Diagnosis not present

## 2014-11-01 DIAGNOSIS — I428 Other cardiomyopathies: Secondary | ICD-10-CM

## 2014-11-01 LAB — CUP PACEART INCLINIC DEVICE CHECK
HIGH POWER IMPEDANCE MEASURED VALUE: 55 Ohm
HighPow Impedance: 34 Ohm
Lead Channel Pacing Threshold Amplitude: 0.8 V
Lead Channel Pacing Threshold Amplitude: 1.7 V
Lead Channel Pacing Threshold Pulse Width: 0.4 ms
Lead Channel Pacing Threshold Pulse Width: 1.5 ms
Lead Channel Sensing Intrinsic Amplitude: 1.5 mV
Lead Channel Sensing Intrinsic Amplitude: 12.6 mV
Lead Channel Setting Pacing Amplitude: 2.4 V
Lead Channel Setting Pacing Pulse Width: 1.5 ms
Lead Channel Setting Sensing Sensitivity: 1 mV
MDC IDC MSMT LEADCHNL LV IMPEDANCE VALUE: 1005 Ohm
MDC IDC MSMT LEADCHNL RA IMPEDANCE VALUE: 654 Ohm
MDC IDC MSMT LEADCHNL RA PACING THRESHOLD PULSEWIDTH: 0.4 ms
MDC IDC MSMT LEADCHNL RV IMPEDANCE VALUE: 700 Ohm
MDC IDC MSMT LEADCHNL RV PACING THRESHOLD AMPLITUDE: 0.8 V
MDC IDC MSMT LEADCHNL RV SENSING INTR AMPL: 24 mV
MDC IDC PG SERIAL: 485875
MDC IDC SESS DTM: 20160729040000
MDC IDC SET LEADCHNL LV PACING AMPLITUDE: 2.2 V
MDC IDC SET LEADCHNL RA PACING AMPLITUDE: 2 V
MDC IDC SET LEADCHNL RV PACING PULSEWIDTH: 0.4 ms
MDC IDC SET LEADCHNL RV SENSING SENSITIVITY: 0.5 mV
MDC IDC SET ZONE DETECTION INTERVAL: 333 ms
Zone Setting Detection Interval: 250 ms
Zone Setting Detection Interval: 286 ms

## 2014-11-01 LAB — BASIC METABOLIC PANEL
BUN: 40 mg/dL — ABNORMAL HIGH (ref 6–23)
CO2: 27 mEq/L (ref 19–32)
Calcium: 9.2 mg/dL (ref 8.4–10.5)
Chloride: 105 mEq/L (ref 96–112)
Creatinine, Ser: 1.45 mg/dL (ref 0.40–1.50)
GFR: 50.43 mL/min — ABNORMAL LOW (ref >60.00)
GLUCOSE: 146 mg/dL — AB (ref 70–99)
Potassium: 4.3 mEq/L (ref 3.5–5.1)
Sodium: 139 mEq/L (ref 135–145)

## 2014-11-01 LAB — POCT INR: INR: 2.2

## 2014-11-01 NOTE — Progress Notes (Signed)
Patient Care Team: Tina Griffiths, MD as PCP - General   HPI  [name] is a 74 y.o. male Seen in followup for CRT-D implantation in the fall 2011. He also had a history of atrial flutter. In 2009 he underwent  porcine aortic valve replacement with 25 mm Carpentier-Edwards valve, replacement of ascending aortic aneurysm with 38-mm Hemishield graft, as well as Cox maze procedure for atrial fibrillation. He takes dofetilide.   He had undergone cardiac catheterization in April of 2009>> s ejection fraction of 20%, no coronary disease,  Last echocardiogram performed 8/11 and showed Ejection fraction was 35%, moderate LAE, mild mitral regurgitation; prosthetic aortic valve with mean gradient of 10 mmHg.  The patient denies chest pain, shortness of breath, nocturnal dyspnea, orthopnea or peripheral edema.  There have been no palpitations, lightheadedness or syncope.  He has noted lower BP for the last few months and this correlaes with sluggishness  He has lost 15 lbs in the last 6 montghs    Past Medical History  Diagnosis Date  . LBBB (left bundle branch block)   . Atrial fibrillation/flutter   . Hypertension   . Aortic stenosis/ bicuspid valve--s/p AVR   . Ascending aortic aneurysm   . DJD (degenerative joint disease)   . Biventricular implantable cardiac defibrillator- BSX   . OSA (obstructive sleep apnea)   . Cholelithiases   . Non-ischemic cardiomyopathy   . Diabetes mellitus without complication     Past Surgical History  Procedure Laterality Date  . Median sternotomy    . Aortic valve replacement    . Appendectomy    . Tonsillectomy    . Cervical discectomy  5/01    C7  . Cholecystectomy      Current Outpatient Prescriptions  Medication Sig Dispense Refill  . amoxicillin (AMOXIL) 500 MG capsule Take 4 capsules (2,000 mg total) by mouth once as needed (for dental procedures). 4 capsule 3  . carvedilol (COREG) 25 MG tablet TAKE 1 TABLET BY MOUTH TWICE A  DAY WITH A MEAL. 60 tablet 5  . cephALEXin (KEFLEX) 500 MG capsule Take 500 mg by mouth 4 (four) times daily.     . furosemide (LASIX) 40 MG tablet TAKE 1 TABLET DAILY AND EXTRA DOSE IF NEEDED 45 tablet 9  . INVOKANA 100 MG TABS tablet     . lisinopril (PRINIVIL,ZESTRIL) 40 MG tablet Take 40 mg by mouth 2 (two) times daily.    . metFORMIN (GLUCOPHAGE) 500 MG tablet Take 1,000 mg by mouth daily with breakfast.     . ofloxacin (OCUFLOX) 0.3 % ophthalmic solution     . sitaGLIPtin (JANUVIA) 100 MG tablet Take 100 mg by mouth daily.    Marland Kitchen spironolactone (ALDACTONE) 25 MG tablet TAKE 1 TABLET (25 MG) BY MOUTH DAILY. 30 tablet 3  . TIKOSYN 250 MCG capsule TAKE 1 CAPSULE BY MOUTH 2 TIMES DAILY. 60 capsule 5  . warfarin (COUMADIN) 5 MG tablet Take as directed by coumadin clinic 40 tablet 3   No current facility-administered medications for this visit.    No Known Allergies  Review of Systems negative except from HPI and PMH  Physical Exam BP 90/60 mmHg  Pulse 70  Ht 5\' 10"  (1.778 m)  Wt 224 lb 6.4 oz (101.787 kg)  BMI 32.20 kg/m2 Well developed and well nourished in no acute distress HENT normal E scleral and icterus clear Neck Supple JVP flat; carotids brisk and full Clear to ausculation  Regular rate  and rhythm, 2/6 systolic murmur diminished S1 and present S4  Loud s2 Soft with active bowel sounds No clubbing cyanosis  Edema Alert and oriented, grossly normal motor and sensory function Skin Warm and Dry Visible diaphragmatic stimulation  ECG demonstrates AV pacing  Assessment and  Plan  Nonischemic cardiomyopathy  Aortic stenosis S./P. bioprosthetic replacement  Diaphragmatic stimulation  Implantable defibrillator-CRT-[employer]  The patient's device was interrogated and the information was fully reviewed.  The device was reprogrammed to  shorten the AV delay and to eliminate diaphragmatic stimulation by creating a vector from the LV tip to RV coil  Congestive  heart.-Chronic-systolic  Diabetes mellitus-2  Atrial fibrillation-dofetilide   There have been no intercurrent atrial fibrillation.   check his dofetilide surveillance laboratories  Euvolemic continue current meds  Continue current occasions for his cardiomyopathy and check potassium levels today on his Aldactone  He is losing wegiht  Further encouraged

## 2014-11-01 NOTE — Patient Instructions (Signed)
Medication Instructions:  Your physician recommends that you continue on your current medications as directed. Please refer to the Current Medication list given to you today.   Labwork: Your physician recommends that you return for lab work TODAY (BMET)   Testing/Procedures: NONE  Follow-Up: Your physician wants you to follow-up in: 12 months with Brandon Balsam, NP You will receive a reminder letter in the mail two months in advance. If you don't receive a letter, please call our office to schedule the follow-up appointment.  Remote monitoring is used to monitor your Pacemaker or ICD from home. This monitoring reduces the number of office visits required to check your device to one time per year. It allows [place] to keep an eye on the functioning of your device to ensure it is working properly. You are scheduled for a device check from home on 02/03/2015. You may send your transmission at any time that day. If you have a wireless device, the transmission will be sent automatically. After your physician reviews your transmission, you will receive a postcard with your next transmission date.    Any Other Special Instructions Will Be Listed Below (If Applicable).

## 2014-11-04 ENCOUNTER — Other Ambulatory Visit: Payer: Self-pay | Admitting: Internal Medicine

## 2014-11-11 ENCOUNTER — Encounter: Payer: Self-pay | Admitting: Podiatry

## 2014-11-11 ENCOUNTER — Ambulatory Visit (INDEPENDENT_AMBULATORY_CARE_PROVIDER_SITE_OTHER): Payer: Medicare Other | Admitting: Podiatry

## 2014-11-11 VITALS — BP 99/48 | HR 78 | Resp 18

## 2014-11-11 DIAGNOSIS — B351 Tinea unguium: Secondary | ICD-10-CM

## 2014-11-11 NOTE — Progress Notes (Signed)
Patient ID: AMARR PLINE, male   DOB: March 13, 1941, 74 y.o.   MRN: [id number]  Subjective: 74 year old male presents the office today to discuss nail biopsy results. States his nails are about the same as it they were. Denies any surrounding redness or drainage from around the nail sites. There is no pain to the nails at this time. No other complaints at this time in no acute changes since last appointment.  Objective: AAO x 3, NAD Neurovascular status unchanged Nails are hypertrophic, dystrophic, discolored, brittle 10. There is no surrounding erythema or drainage. There is no tenderness the patient on the nails at this time. No open lesions or pre-ulcerative lesions. No pain or calf compression, swelling, warmth, erythema.  Assessment: 74 year old male with onychomycosis  Plan: Nail biopsy results were discussed with the patient was revealed onychomycosis. Discussed various treatment options. At this time elected to proceed with laser treatment. I discussed with him to this is not a guarantee the nails will improve. He understands this and wishes to proceed with laser. I also dispensed formula 3 for topical treatment.  Laserering of Toenails was carried out at today's visit via Q-switch YAG laser by QClear Laser at continuous on the 10 digit toenails.  Patient and staff were wearing appropriate laser protective goggles/eyewear Laser device was tested prior to use and safety protocols were followed Frequency of 5 Hz, Level 4, Joules 1.3 delivered. The patient tolerated the lasering well without any complications. They were encouraged to call the office with any questions, concerns, change in symptoms. -Follow-up 1 month for repeat laser or sooner if any problems arise. In the meantime, encouraged to call the office with any questions, concerns, change in symptoms.    Ovid Curd, DPM

## 2014-11-18 ENCOUNTER — Telehealth: Payer: Self-pay | Admitting: *Deleted

## 2014-11-18 NOTE — Telephone Encounter (Signed)
LM on both #s listed to return call regarding ICD.

## 2014-11-18 NOTE — Telephone Encounter (Signed)
Scheduled ICD generator change for 8/17. Pre procedure labs to be done at hospital Wednesday. Wound check on 8/31. Reviewed procedure instructions with patient:    NPO after MN the night before procedure;    No Lasix, Metformin, Januvia, or Invokana the morning of procedure;    Wash chest and neck night before and morning of procedure. Patient verbalized understanding and agreeable to plan.

## 2014-11-18 NOTE — Telephone Encounter (Signed)
Patient made aware that ICD battery is under advisory and needs to be replaced (advised by Dennie Maizes of [employer]). Patient agreeable to plan and has availability as early as Wednesday.   Will route to Dory Horn, Charity fundraiser

## 2014-11-19 ENCOUNTER — Telehealth: Payer: Self-pay | Admitting: Internal Medicine

## 2014-11-19 NOTE — Telephone Encounter (Signed)
Incorrect on below conversation.  I was misinformed on situation with device. Device rep will call patient to explain this.   Patient may potentially have a small out of pocket expense.  This device is not under recall.

## 2014-11-19 NOTE — Telephone Encounter (Signed)
New message       Pt request to talk to the nurse.  He is scheduled to have a device [employer].  He said he was told that [employer] will pay because the device is a recall. Someone from the hosp called asking for his ins info and they told him that [employer] will pay what his ins does not pay.  He want clarification on who pays for the recall.

## 2014-11-19 NOTE — Telephone Encounter (Signed)
Informed patient that this is correct.  What insurance does not pay for, [employer] will cover (since it is an Administrator, [employer] thru [employer]) He verbalized understanding.

## 2014-11-20 ENCOUNTER — Ambulatory Visit (HOSPITAL_COMMUNITY)
Admission: RE | Admit: 2014-11-20 | Discharge: 2014-11-20 | Disposition: A | Discharge status: Home / Self Care | Payer: Medicare Other | Source: Ambulatory Visit | Attending: Internal Medicine | Admitting: Internal Medicine

## 2014-11-20 ENCOUNTER — Encounter (HOSPITAL_COMMUNITY)
Admission: RE | Disposition: A | Discharge status: Home / Self Care | Payer: Medicare Other | Source: Ambulatory Visit | Attending: Internal Medicine

## 2014-11-20 ENCOUNTER — Encounter (HOSPITAL_COMMUNITY): Payer: Self-pay | Admitting: Internal Medicine

## 2014-11-20 DIAGNOSIS — Z7901 Long term (current) use of anticoagulants: Secondary | ICD-10-CM | POA: Insufficient documentation

## 2014-11-20 DIAGNOSIS — I429 Cardiomyopathy, unspecified: Secondary | ICD-10-CM | POA: Diagnosis not present

## 2014-11-20 DIAGNOSIS — I4892 Unspecified atrial flutter: Secondary | ICD-10-CM | POA: Diagnosis not present

## 2014-11-20 DIAGNOSIS — Z95810 Presence of automatic (implantable) cardiac defibrillator: Secondary | ICD-10-CM | POA: Diagnosis present

## 2014-11-20 DIAGNOSIS — I447 Left bundle-branch block, unspecified: Secondary | ICD-10-CM | POA: Insufficient documentation

## 2014-11-20 DIAGNOSIS — M1990 Unspecified osteoarthritis, unspecified site: Secondary | ICD-10-CM | POA: Diagnosis not present

## 2014-11-20 DIAGNOSIS — Z79899 Other long term (current) drug therapy: Secondary | ICD-10-CM | POA: Diagnosis not present

## 2014-11-20 DIAGNOSIS — Z4502 Encounter for adjustment and management of automatic implantable cardiac defibrillator: Secondary | ICD-10-CM | POA: Diagnosis present

## 2014-11-20 DIAGNOSIS — Z95828 Presence of other vascular implants and grafts: Secondary | ICD-10-CM | POA: Diagnosis not present

## 2014-11-20 DIAGNOSIS — G4733 Obstructive sleep apnea (adult) (pediatric): Secondary | ICD-10-CM | POA: Insufficient documentation

## 2014-11-20 DIAGNOSIS — E119 Type 2 diabetes mellitus without complications: Secondary | ICD-10-CM | POA: Diagnosis not present

## 2014-11-20 DIAGNOSIS — I10 Essential (primary) hypertension: Secondary | ICD-10-CM | POA: Insufficient documentation

## 2014-11-20 DIAGNOSIS — I428 Other cardiomyopathies: Secondary | ICD-10-CM | POA: Diagnosis not present

## 2014-11-20 DIAGNOSIS — Z953 Presence of xenogenic heart valve: Secondary | ICD-10-CM | POA: Diagnosis not present

## 2014-11-20 DIAGNOSIS — Z792 Long term (current) use of antibiotics: Secondary | ICD-10-CM | POA: Insufficient documentation

## 2014-11-20 HISTORY — PX: EP IMPLANTABLE DEVICE: SHX172B

## 2014-11-20 LAB — SURGICAL PCR SCREEN
MRSA, PCR: NEGATIVE
STAPHYLOCOCCUS AUREUS: NEGATIVE

## 2014-11-20 LAB — GLUCOSE, CAPILLARY: Glucose-Capillary: 143 mg/dL — ABNORMAL HIGH (ref 65–99)

## 2014-11-20 LAB — CBC
HCT: 40.2 % (ref 39.0–52.0)
HEMOGLOBIN: 13.9 g/dL (ref 13.0–17.0)
MCH: 30.3 pg (ref 26.0–34.0)
MCHC: 34.6 g/dL (ref 30.0–36.0)
MCV: 87.8 fL (ref 78.0–100.0)
Platelets: 188 10*3/uL (ref 150–400)
RBC: 4.58 MIL/uL (ref 4.22–5.81)
RDW: 13.4 % (ref 11.5–15.5)
WBC: 7 10*3/uL (ref 4.0–10.5)

## 2014-11-20 LAB — PROTIME-INR
INR: 1.73 — ABNORMAL HIGH (ref 0.00–1.49)
PROTHROMBIN TIME: 20.3 s — AB (ref 11.6–15.2)

## 2014-11-20 SURGERY — ICD/BIV ICD GENERATOR CHANGEOUT

## 2014-11-20 MED ORDER — ACETAMINOPHEN 325 MG PO TABS
325.0000 mg | ORAL_TABLET | ORAL | Status: DC | PRN
  Filled 2014-11-20: qty 2

## 2014-11-20 MED ORDER — MIDAZOLAM HCL 5 MG/5ML IJ SOLN
INTRAMUSCULAR | Status: DC | PRN
  Administered 2014-11-20 (×5): 1 mg via INTRAVENOUS

## 2014-11-20 MED ORDER — CHLORHEXIDINE GLUCONATE 4 % EX LIQD
60.0000 mL | Freq: Once | CUTANEOUS | Status: DC
  Filled 2014-11-20: qty 60

## 2014-11-20 MED ORDER — SODIUM CHLORIDE 0.9 % IV SOLN: INTRAVENOUS | Status: DC

## 2014-11-20 MED ORDER — SODIUM CHLORIDE 0.9 % IR SOLN
80.0000 mg | Status: DC
  Filled 2014-11-20: qty 2

## 2014-11-20 MED ORDER — MIDAZOLAM HCL 5 MG/5ML IJ SOLN
INTRAMUSCULAR | Status: AC
  Filled 2014-11-20: qty 25

## 2014-11-20 MED ORDER — CEFAZOLIN SODIUM-DEXTROSE 2-3 GM-% IV SOLR
INTRAVENOUS | Status: DC | PRN
  Administered 2014-11-20: 2 g via INTRAVENOUS

## 2014-11-20 MED ORDER — SODIUM CHLORIDE 0.9 % IR SOLN
Status: AC
  Filled 2014-11-20: qty 2

## 2014-11-20 MED ORDER — CEFAZOLIN SODIUM-DEXTROSE 2-3 GM-% IV SOLR: 2.0000 g | INTRAVENOUS | Status: DC

## 2014-11-20 MED ORDER — FENTANYL CITRATE (PF) 100 MCG/2ML IJ SOLN
INTRAMUSCULAR | Status: DC | PRN
  Administered 2014-11-20 (×2): 25 ug via INTRAVENOUS

## 2014-11-20 MED ORDER — MUPIROCIN 2 % EX OINT
1.0000 "application " | TOPICAL_OINTMENT | Freq: Once | CUTANEOUS | Status: AC
  Administered 2014-11-20: 1 via TOPICAL
  Filled 2014-11-20: qty 22

## 2014-11-20 MED ORDER — FENTANYL CITRATE (PF) 100 MCG/2ML IJ SOLN
INTRAMUSCULAR | Status: AC
  Filled 2014-11-20: qty 4

## 2014-11-20 MED ORDER — MUPIROCIN 2 % EX OINT
TOPICAL_OINTMENT | CUTANEOUS | Status: AC
  Administered 2014-11-20: 1 via TOPICAL
  Filled 2014-11-20: qty 22

## 2014-11-20 MED ORDER — LIDOCAINE HCL (PF) 1 % IJ SOLN
INTRAMUSCULAR | Status: DC | PRN
  Administered 2014-11-20: 45 mL via SUBCUTANEOUS

## 2014-11-20 MED ORDER — LIDOCAINE HCL (PF) 1 % IJ SOLN
INTRAMUSCULAR | Status: AC
  Filled 2014-11-20: qty 60

## 2014-11-20 MED ORDER — ONDANSETRON HCL 4 MG/2ML IJ SOLN: 4.0000 mg | Freq: Four times a day (QID) | INTRAMUSCULAR | Status: DC | PRN

## 2014-11-20 MED ORDER — SODIUM CHLORIDE 0.9 % IV SOLN
INTRAVENOUS | Status: DC
  Administered 2014-11-20: 10:00:00 via INTRAVENOUS

## 2014-11-20 SURGICAL SUPPLY — 4 items
CABLE SURGICAL S-101-97-12 (CABLE) ×3 IMPLANT
ICD INOGEN CRT-D G141 (ICD Generator) ×3 IMPLANT
PAD DEFIB LIFELINK (PAD) ×3 IMPLANT
TRAY PACEMAKER INSERTION (CUSTOM PROCEDURE TRAY) ×3 IMPLANT

## 2014-11-20 NOTE — Discharge Instructions (Signed)
Pacemaker Battery Change, Care After °Refer to this sheet in the next few weeks. These instructions provide you with information on caring for yourself after your procedure. Your health care provider may also give you more specific instructions. Your treatment has been planned according to current medical practices, but problems sometimes occur. Call your health care provider if you have any problems or questions after your procedure. °WHAT TO EXPECT AFTER THE PROCEDURE °After your procedure, it is typical to have the following sensations: °· Soreness at the pacemaker site. °HOME CARE INSTRUCTIONS  °· Keep the incision clean and dry. °· Unless advised otherwise, you may shower beginning 48 hours after your procedure. °· For the first week after the replacement, avoid stretching motions that pull at the incision site, and avoid heavy exercise with the arm that is on the same side as the incision. °· Take medicines only as directed by your health care provider. °· Keep all follow-up visits as directed by your health care provider. °SEEK MEDICAL CARE IF:  °· You have pain at the incision site that is not relieved by over-the-counter or prescription medicine. °· There is drainage or pus from the incision site. °· There is swelling larger than a lime at the incision site. °· You develop red streaking that extends above or below the incision site. °· You feel brief, intermittent palpitations, light-headedness, or any symptoms that you feel might be related to your heart. °SEEK IMMEDIATE MEDICAL CARE IF:  °· You experience chest pain that is different than the pain at the pacemaker site. °· You experience shortness of breath. °· You have palpitations or irregular heartbeat. °· You have light-headedness that does not go away quickly. °· You faint. °· You have pain that gets worse and is not relieved by medicine. °Document Released: 01/10/2013 Document Revised: 08/06/2013 Document Reviewed: 01/10/2013 °ExitCare® Patient  Information ©2015 [employer]. This information is not intended to replace advice given to you by your health care provider. Make sure you discuss any questions you have with your health care provider. ° °

## 2014-11-20 NOTE — H&P (View-Only) (Signed)
Patient Care Team: Tina Griffiths, MD as PCP - General   HPI  [name] is a 74 y.o. male Seen in followup for CRT-D implantation in the fall 2011. He also had a history of atrial flutter. In 2009 he underwent  porcine aortic valve replacement with 25 mm Carpentier-Edwards valve, replacement of ascending aortic aneurysm with 38-mm Hemishield graft, as well as Cox maze procedure for atrial fibrillation. He takes dofetilide.   He had undergone cardiac catheterization in April of 2009>> s ejection fraction of 20%, no coronary disease,  Last echocardiogram performed 8/11 and showed Ejection fraction was 35%, moderate LAE, mild mitral regurgitation; prosthetic aortic valve with mean gradient of 10 mmHg.  The patient denies chest pain, shortness of breath, nocturnal dyspnea, orthopnea or peripheral edema.  There have been no palpitations, lightheadedness or syncope.  He has noted lower BP for the last few months and this correlaes with sluggishness  He has lost 15 lbs in the last 6 montghs    Past Medical History  Diagnosis Date  . LBBB (left bundle branch block)   . Atrial fibrillation/flutter   . Hypertension   . Aortic stenosis/ bicuspid valve--s/p AVR   . Ascending aortic aneurysm   . DJD (degenerative joint disease)   . Biventricular implantable cardiac defibrillator- BSX   . OSA (obstructive sleep apnea)   . Cholelithiases   . Non-ischemic cardiomyopathy   . Diabetes mellitus without complication     Past Surgical History  Procedure Laterality Date  . Median sternotomy    . Aortic valve replacement    . Appendectomy    . Tonsillectomy    . Cervical discectomy  5/01    C7  . Cholecystectomy      Current Outpatient Prescriptions  Medication Sig Dispense Refill  . amoxicillin (AMOXIL) 500 MG capsule Take 4 capsules (2,000 mg total) by mouth once as needed (for dental procedures). 4 capsule 3  . carvedilol (COREG) 25 MG tablet TAKE 1 TABLET BY MOUTH TWICE A  DAY WITH A MEAL. 60 tablet 5  . cephALEXin (KEFLEX) 500 MG capsule Take 500 mg by mouth 4 (four) times daily.     . furosemide (LASIX) 40 MG tablet TAKE 1 TABLET DAILY AND EXTRA DOSE IF NEEDED 45 tablet 9  . INVOKANA 100 MG TABS tablet     . lisinopril (PRINIVIL,ZESTRIL) 40 MG tablet Take 40 mg by mouth 2 (two) times daily.    . metFORMIN (GLUCOPHAGE) 500 MG tablet Take 1,000 mg by mouth daily with breakfast.     . ofloxacin (OCUFLOX) 0.3 % ophthalmic solution     . sitaGLIPtin (JANUVIA) 100 MG tablet Take 100 mg by mouth daily.    Marland Kitchen spironolactone (ALDACTONE) 25 MG tablet TAKE 1 TABLET (25 MG) BY MOUTH DAILY. 30 tablet 3  . TIKOSYN 250 MCG capsule TAKE 1 CAPSULE BY MOUTH 2 TIMES DAILY. 60 capsule 5  . warfarin (COUMADIN) 5 MG tablet Take as directed by coumadin clinic 40 tablet 3   No current facility-administered medications for this visit.    No Known Allergies  Review of Systems negative except from HPI and PMH  Physical Exam BP 90/60 mmHg  Pulse 70  Ht 5\' 10"  (1.778 m)  Wt 224 lb 6.4 oz (101.787 kg)  BMI 32.20 kg/m2 Well developed and well nourished in no acute distress HENT normal E scleral and icterus clear Neck Supple JVP flat; carotids brisk and full Clear to ausculation  Regular rate  and rhythm, 2/6 systolic murmur diminished S1 and present S4  Loud s2 Soft with active bowel sounds No clubbing cyanosis  Edema Alert and oriented, grossly normal motor and sensory function Skin Warm and Dry Visible diaphragmatic stimulation  ECG demonstrates AV pacing  Assessment and  Plan  Nonischemic cardiomyopathy  Aortic stenosis S./P. bioprosthetic replacement  Diaphragmatic stimulation  Implantable defibrillator-CRT-[employer]  The patient's device was interrogated and the information was fully reviewed.  The device was reprogrammed to  shorten the AV delay and to eliminate diaphragmatic stimulation by creating a vector from the LV tip to RV coil  Congestive  heart.-Chronic-systolic  Diabetes mellitus-2  Atrial fibrillation-dofetilide   There have been no intercurrent atrial fibrillation.   check his dofetilide surveillance laboratories  Euvolemic continue current meds  Continue current occasions for his cardiomyopathy and check potassium levels today on his Aldactone  He is losing wegiht  Further encouraged

## 2014-11-20 NOTE — Interval H&P Note (Signed)
History and Physical Interval Note:  11/20/2014 9:01 AM  Lelon Perla  has presented today for surgery, with the diagnosis of eri  The various methods of treatment have been discussed with the patient and family. After consideration of risks, benefits and other options for treatment, the patient has consented to  Procedure(s):  ICD [employer] (N/A) as a surgical intervention .  The patient's history has been reviewed, patient examined, no change in status, stable for surgery.  I have reviewed the patient's chart and labs.  Questions were answered to the patient's satisfaction.     Sherryl Manges  We subsewquently received a signal that there was a battery issue which prompts a recommendation from [facility] [employer] that the device should be replaced  It has exceedded warranty  We have reviewed the benefits and risks of generator replacement.  These include but are not limited to lead fracture and infection.  The patient understands, agrees and is willing to proceed.

## 2014-11-20 NOTE — Interval H&P Note (Signed)
ICD Criteria  Current LVEF:50%. Within 12 months prior to implant: No   Heart failure history: Yes, Class II  Cardiomyopathy history: Yes, Non-Ischemic Cardiomyopathy.  Atrial Fibrillation/Atrial Flutter: Yes, Paroxysmal.  Ventricular tachycardia history: No.  Cardiac arrest history: No.  History of syndromes with risk of sudden death: No.  Previous ICD: Yes, Reason for ICD:  Primary prevention.  Precious CRT-D  Current ICD indication: Primary  PPM indication: Yes. Greater than 40% RV pacing requirement anticipated. Class I or II Huston Foley indication present: No  Beta Blocker therapy for 3 or more months: Yes, prescribed.   Ace Inhibitor/ARB therapy for 3 or more months: Yes, prescribed.   History and Physical Interval Note:  11/20/2014 9:03 AM  [name]  has presented today for surgery, with the diagnosis of eri  The various methods of treatment have been discussed with the patient and family. After consideration of risks, benefits and other options for treatment, the patient has consented to  Procedure(s):  ICD [employer] (N/A) as a surgical intervention .  The patient's history has been reviewed, patient examined, no change in status, stable for surgery.  I have reviewed the patient's chart and labs.  Questions were answered to the patient's satisfaction.     Sherryl Manges

## 2014-11-21 MED FILL — Sodium Chloride Irrigation Soln 0.9%: Qty: 500 | Status: AC

## 2014-11-21 MED FILL — Gentamicin Sulfate Inj 40 MG/ML: INTRAMUSCULAR | Qty: 2 | Status: AC

## 2014-11-25 ENCOUNTER — Encounter (HOSPITAL_COMMUNITY): Payer: Self-pay | Admitting: Internal Medicine

## 2014-12-04 ENCOUNTER — Ambulatory Visit (INDEPENDENT_AMBULATORY_CARE_PROVIDER_SITE_OTHER): Payer: Medicare Other | Admitting: *Deleted

## 2014-12-04 DIAGNOSIS — I4891 Unspecified atrial fibrillation: Secondary | ICD-10-CM | POA: Diagnosis not present

## 2014-12-04 DIAGNOSIS — Z95810 Presence of automatic (implantable) cardiac defibrillator: Secondary | ICD-10-CM

## 2014-12-04 DIAGNOSIS — I5022 Chronic systolic (congestive) heart failure: Secondary | ICD-10-CM

## 2014-12-04 LAB — CUP PACEART INCLINIC DEVICE CHECK
Brady Statistic RV Percent Paced: 81 %
HighPow Impedance: 52 Ohm
Lead Channel Impedance Value: 786 Ohm
Lead Channel Pacing Threshold Amplitude: 0.8 V
Lead Channel Pacing Threshold Amplitude: 1.8 V
Lead Channel Pacing Threshold Pulse Width: 0.4 ms
Lead Channel Sensing Intrinsic Amplitude: 1.5 mV
Lead Channel Sensing Intrinsic Amplitude: 14.5 mV
Lead Channel Sensing Intrinsic Amplitude: 25 mV
Lead Channel Setting Pacing Amplitude: 2 V
Lead Channel Setting Pacing Amplitude: 2.2 V
Lead Channel Setting Pacing Pulse Width: 1.5 ms
Lead Channel Setting Sensing Sensitivity: 0.5 mV
Lead Channel Setting Sensing Sensitivity: 1 mV
MDC IDC MSMT LEADCHNL LV IMPEDANCE VALUE: 987 Ohm
MDC IDC MSMT LEADCHNL LV PACING THRESHOLD PULSEWIDTH: 1.5 ms
MDC IDC MSMT LEADCHNL RA IMPEDANCE VALUE: 698 Ohm
MDC IDC MSMT LEADCHNL RA PACING THRESHOLD AMPLITUDE: 0.8 V
MDC IDC MSMT LEADCHNL RA PACING THRESHOLD PULSEWIDTH: 0.4 ms
MDC IDC PG SERIAL: 113381
MDC IDC SESS DTM: 20160831040000
MDC IDC SET LEADCHNL RV PACING AMPLITUDE: 2.4 V
MDC IDC SET LEADCHNL RV PACING PULSEWIDTH: 0.4 ms
MDC IDC SET ZONE DETECTION INTERVAL: 286 ms
MDC IDC SET ZONE DETECTION INTERVAL: 333 ms
MDC IDC STAT BRADY RA PERCENT PACED: 85 %
Zone Setting Detection Interval: 250 ms

## 2014-12-04 NOTE — Progress Notes (Signed)
Wound check appointment. Wound without redness or edema. Incision edges approximated, wound well healed. Normal device function. Thresholds, sensing, and impedances consistent with implant measurements. Histogram distribution appropriate for patient and level of activity. No mode switches or ventricular arrhythmias noted. Patient educated about wound care, arm mobility, lifting restrictions, shock plan. ROV 02/21/15 with SK.

## 2014-12-13 ENCOUNTER — Ambulatory Visit (INDEPENDENT_AMBULATORY_CARE_PROVIDER_SITE_OTHER): Payer: Medicare Other | Admitting: Podiatry

## 2014-12-13 ENCOUNTER — Ambulatory Visit (INDEPENDENT_AMBULATORY_CARE_PROVIDER_SITE_OTHER): Payer: Medicare Other | Admitting: *Deleted

## 2014-12-13 ENCOUNTER — Encounter: Payer: Self-pay | Admitting: Podiatry

## 2014-12-13 DIAGNOSIS — Z5181 Encounter for therapeutic drug level monitoring: Secondary | ICD-10-CM | POA: Diagnosis not present

## 2014-12-13 DIAGNOSIS — I4891 Unspecified atrial fibrillation: Secondary | ICD-10-CM | POA: Diagnosis not present

## 2014-12-13 DIAGNOSIS — B351 Tinea unguium: Secondary | ICD-10-CM

## 2014-12-13 LAB — POCT INR: INR: 1.9

## 2014-12-16 NOTE — Progress Notes (Signed)
Patient ID: [name], male   DOB: 1940/08/29, 74 y.o.   MRN: [id number]  Subjective: 74 year old male presents the office today for laser treatment #2.  He states he is also continue with an over-the-counter topical medication as well. He has not noticed much of a difference as toenails this last appointment. Denies any redness or drainage from the nail sites. There is no pain to the nails at this time. No other complaints at this time in no acute changes since last appointment.  Objective: AAO x 3, NAD Neurovascular status unchanged Nails are hypertrophic, dystrophic, discolored, brittle 10. There is no surrounding erythema or drainage. There is no tenderness the patient on the nails at this time. There does appear to be some evidence of clearing on the proximal nails particularly of the hallux. No open lesions or pre-ulcerative lesions. No pain or calf compression, swelling, warmth, erythema.  Assessment: 74 year old male with onychomycosis  Plan: -Treatment options discussed including all alternatives, risks, and complications -I discussed with him laser treatment of this is not a guarantee to get clearing of the toenails. He understands this and wishes to proceed.  Laserering of Toenails was carried out at today's visit via Q-switch YAG laser by QClear Laser at continuous on the 10 digit toenails.  Patient and staff were wearing appropriate laser protective goggles/eyewear Laser device was tested prior to use and safety protocols were followed Frequency of 5 Hz, Level 4, Joules 1.3 delivered. The patient tolerated the lasering well without any complications. They were encouraged to call the office with any questions, concerns, change in symptoms. -Follow-up 3 month for repeat laser or sooner if any problems arise. In the meantime, encouraged to call the office with any questions, concerns, change in symptoms. *debridement of nails prior to laser  Ovid Curd, DPM

## 2014-12-26 ENCOUNTER — Ambulatory Visit: Payer: Medicare Other | Admitting: Podiatry

## 2014-12-31 ENCOUNTER — Encounter: Payer: Self-pay | Admitting: Podiatry

## 2014-12-31 ENCOUNTER — Ambulatory Visit (INDEPENDENT_AMBULATORY_CARE_PROVIDER_SITE_OTHER): Payer: Medicare Other | Admitting: Podiatry

## 2014-12-31 DIAGNOSIS — M79676 Pain in unspecified toe(s): Secondary | ICD-10-CM

## 2014-12-31 DIAGNOSIS — B351 Tinea unguium: Secondary | ICD-10-CM | POA: Diagnosis not present

## 2014-12-31 NOTE — Progress Notes (Signed)
Patient ID: [name], male   DOB: Jan 05, 1941, 74 y.o.   MRN: [id number] Complaint:  Visit Type: Patient returns to my office for continued preventative foot care services. Complaint: Patient states" my nails have grown long and thick and become painful to walk and wear shoes"  The patient presents for preventative foot care services. He presents for care of his nails since he is in the middle of laser treatment of his nails.  Podiatric Exam: Vascular: dorsalis pedis and posterior tibial pulses are palpable bilateral. Capillary return is immediate. Temperature gradient is WNL. Skin turgor WNL  Sensorium: Normal Semmes Weinstein monofilament test. Normal tactile sensation bilaterally. Nail Exam: Pt has thick disfigured discolored nails with subungual debris noted bilateral entire nail hallux through fifth toenails Ulcer Exam: There is no evidence of ulcer or pre-ulcerative changes or infection. Orthopedic Exam: Muscle tone and strength are WNL. No limitations in general ROM. No crepitus or effusions noted. Foot type and digits show no abnormalities. Bony prominences are unremarkable. Skin: No Porokeratosis. No infection or ulcers  Diagnosis:  Onychomycosis, , Pain in right toe, pain in left toes  Treatment & Plan Procedures and Treatment: Consent by patient was obtained for treatment procedures. The patient understood the discussion of treatment and procedures well. All questions were answered thoroughly reviewed. Debridement of mycotic and hypertrophic toenails, 1 through 5 bilateral and clearing of subungual debris. No ulceration, no infection noted.  Return Visit-Office Procedure: Patient instructed to return to the office for a follow up visit 3 months for continued evaluation and treatment.

## 2015-01-06 ENCOUNTER — Encounter: Payer: Self-pay | Admitting: Internal Medicine

## 2015-01-14 ENCOUNTER — Encounter: Payer: Self-pay | Admitting: Internal Medicine

## 2015-01-20 ENCOUNTER — Other Ambulatory Visit: Payer: Self-pay | Admitting: Cardiology

## 2015-01-24 ENCOUNTER — Ambulatory Visit (INDEPENDENT_AMBULATORY_CARE_PROVIDER_SITE_OTHER): Payer: Medicare Other | Admitting: *Deleted

## 2015-01-24 DIAGNOSIS — I4891 Unspecified atrial fibrillation: Secondary | ICD-10-CM

## 2015-01-24 DIAGNOSIS — Z5181 Encounter for therapeutic drug level monitoring: Secondary | ICD-10-CM | POA: Diagnosis not present

## 2015-01-24 LAB — POCT INR: INR: 1.9

## 2015-02-03 ENCOUNTER — Other Ambulatory Visit: Payer: Self-pay | Admitting: Cardiology

## 2015-02-07 ENCOUNTER — Ambulatory Visit (INDEPENDENT_AMBULATORY_CARE_PROVIDER_SITE_OTHER): Payer: Medicare Other | Admitting: *Deleted

## 2015-02-07 DIAGNOSIS — Z5181 Encounter for therapeutic drug level monitoring: Secondary | ICD-10-CM | POA: Diagnosis not present

## 2015-02-07 DIAGNOSIS — I4891 Unspecified atrial fibrillation: Secondary | ICD-10-CM | POA: Diagnosis not present

## 2015-02-07 LAB — POCT INR: INR: 2.3

## 2015-02-21 ENCOUNTER — Ambulatory Visit (INDEPENDENT_AMBULATORY_CARE_PROVIDER_SITE_OTHER): Payer: Medicare Other

## 2015-02-21 ENCOUNTER — Encounter: Payer: Self-pay | Admitting: Internal Medicine

## 2015-02-21 ENCOUNTER — Ambulatory Visit (INDEPENDENT_AMBULATORY_CARE_PROVIDER_SITE_OTHER): Payer: Medicare Other | Admitting: Internal Medicine

## 2015-02-21 VITALS — BP 104/66 | HR 71 | Ht 70.0 in | Wt 226.4 lb

## 2015-02-21 DIAGNOSIS — Z95810 Presence of automatic (implantable) cardiac defibrillator: Secondary | ICD-10-CM

## 2015-02-21 DIAGNOSIS — I429 Cardiomyopathy, unspecified: Secondary | ICD-10-CM

## 2015-02-21 DIAGNOSIS — Z5181 Encounter for therapeutic drug level monitoring: Secondary | ICD-10-CM | POA: Diagnosis not present

## 2015-02-21 DIAGNOSIS — I428 Other cardiomyopathies: Secondary | ICD-10-CM

## 2015-02-21 DIAGNOSIS — I4891 Unspecified atrial fibrillation: Secondary | ICD-10-CM

## 2015-02-21 DIAGNOSIS — I5022 Chronic systolic (congestive) heart failure: Secondary | ICD-10-CM | POA: Diagnosis not present

## 2015-02-21 LAB — BASIC METABOLIC PANEL
BUN: 29 mg/dL — ABNORMAL HIGH (ref 7–25)
CHLORIDE: 102 mmol/L (ref 98–110)
CO2: 24 mmol/L (ref 20–31)
CREATININE: 1.19 mg/dL — AB (ref 0.70–1.18)
Calcium: 9.1 mg/dL (ref 8.6–10.3)
Glucose, Bld: 139 mg/dL — ABNORMAL HIGH (ref 65–99)
POTASSIUM: 4.7 mmol/L (ref 3.5–5.3)
Sodium: 137 mmol/L (ref 135–146)

## 2015-02-21 LAB — CUP PACEART INCLINIC DEVICE CHECK
Date Time Interrogation Session: 20161118050000
HighPow Impedance: 51 Ohm
Implantable Lead Location: 753858
Implantable Lead Model: 5076
Lead Channel Impedance Value: 709 Ohm
Lead Channel Pacing Threshold Amplitude: 1.6 V
Lead Channel Pacing Threshold Pulse Width: 0.4 ms
Lead Channel Sensing Intrinsic Amplitude: 1.9 mV
Lead Channel Sensing Intrinsic Amplitude: 14.8 mV
Lead Channel Setting Pacing Amplitude: 2 V
Lead Channel Setting Pacing Amplitude: 2.4 V
Lead Channel Setting Pacing Pulse Width: 0.4 ms
MDC IDC LEAD IMPLANT DT: 20111201
MDC IDC LEAD IMPLANT DT: 20111201
MDC IDC LEAD IMPLANT DT: 20111201
MDC IDC LEAD LOCATION: 753859
MDC IDC LEAD LOCATION: 753860
MDC IDC LEAD MODEL: 158
MDC IDC LEAD MODEL: 4396
MDC IDC LEAD SERIAL: 302703
MDC IDC MSMT LEADCHNL LV IMPEDANCE VALUE: 951 Ohm
MDC IDC MSMT LEADCHNL LV PACING THRESHOLD PULSEWIDTH: 1.5 ms
MDC IDC MSMT LEADCHNL RA PACING THRESHOLD AMPLITUDE: 0.8 V
MDC IDC MSMT LEADCHNL RA PACING THRESHOLD PULSEWIDTH: 0.4 ms
MDC IDC MSMT LEADCHNL RV IMPEDANCE VALUE: 788 Ohm
MDC IDC MSMT LEADCHNL RV PACING THRESHOLD AMPLITUDE: 0.9 V
MDC IDC MSMT LEADCHNL RV SENSING INTR AMPL: 25 mV — AB
MDC IDC SET LEADCHNL LV PACING AMPLITUDE: 2.2 V
MDC IDC SET LEADCHNL LV PACING PULSEWIDTH: 1.5 ms
MDC IDC SET LEADCHNL LV SENSING SENSITIVITY: 1 mV
MDC IDC SET LEADCHNL RV SENSING SENSITIVITY: 0.5 mV
Pulse Gen Serial Number: 113381

## 2015-02-21 LAB — POCT INR: INR: 1.9

## 2015-02-21 NOTE — Patient Instructions (Addendum)
Medication Instructions: - no changes  Labwork: - Your physician recommends that you have lab work today: BMP  Procedures/Testing: - Your physician has requested that you have an echocardiogram. Echocardiography is a painless test that uses sound waves to create images of your heart. It provides your doctor with information about the size and shape of your heart and how well your heart's chambers and valves are working. This procedure takes approximately one hour. There are no restrictions for this procedure.  Follow-Up: - Your physician recommends that you schedule a follow-up appointment in: 3 months with Dr. Jens Som  - Remote monitoring is used to monitor your Pacemaker of ICD from home. This monitoring reduces the number of office visits required to check your device to one time per year. It allows [place] to keep an eye on the functioning of your device to ensure it is working properly. You are scheduled for a device check from home on 05/26/15. You may send your transmission at any time that day. If you have a wireless device, the transmission will be sent automatically. After your physician reviews your transmission, you will receive a postcard with your next transmission date.  - Your physician wants you to follow-up in: 1 year with Dr. Graciela Husbands. You will receive a reminder letter in the mail two months in advance. If you don't receive a letter, please call our office to schedule the follow-up appointment.  Any Additional Special Instructions Will Be Listed Below (If Applicable).

## 2015-02-21 NOTE — Progress Notes (Signed)
Patient Care Team: Gaspar Garbe, MD as PCP - General   HPI  [name] is a 74 y.o. male Seen in followup for CRT-D implantation in the fall 2011. He also had a history of atrial flutter. In 2009 he underwent  porcine aortic valve replacement with 25 mm Carpentier-Edwards valve, replacement of ascending aortic aneurysm with 38-mm Hemishield graft, as well as Cox maze procedure for atrial fibrillation. He takes dofetilide.  He underwent device replacement 8/16    Cardiac catheterization in April of 2009>> s ejection fraction of 20%, no coronary disease,  Echocardiogram performed  814 demonstrated interval improvement 30-35---50%.  The patient denies   chest pain, shortness of breath, nocturnal dyspnea, orthopnea ; he does have mild variable peripheral edema.  There have been no palpitations, lightheadedness or syncope.  He has a prior history of sleep apnea. He does not sleep well. He sleeps in a chair.       Past Medical History  Diagnosis Date  . LBBB (left bundle branch block)   . Atrial fibrillation/flutter   . Hypertension   . Aortic stenosis/ bicuspid valve--s/p AVR   . Ascending aortic aneurysm (HCC)   . DJD (degenerative joint disease)   . Biventricular implantable cardiac defibrillator- BSX   . OSA (obstructive sleep apnea)   . Cholelithiases   . Non-ischemic cardiomyopathy (HCC)   . Diabetes mellitus without complication [facility])     Past Surgical History  Procedure Laterality Date  . Median sternotomy    . Aortic valve replacement    . Appendectomy    . Tonsillectomy    . Cervical discectomy  5/01    C7  . Cholecystectomy    . Ep implantable device N/A 11/20/2014    Procedure:  ICD Generator Changeout;  Surgeon: Duke Salvia, MD;  Location: [facility] INVASIVE CV LAB;  Service: Cardiovascular;  Laterality: N/A;    Current Outpatient Prescriptions  Medication Sig Dispense Refill  . amoxicillin (AMOXIL) 500 MG capsule Take 4 capsules (2,000 mg  total) by mouth once as needed (for dental procedures). 4 capsule 3  . carvedilol (COREG) 25 MG tablet TAKE 1 TABLET BY MOUTH TWICE A DAY WITH A MEAL. 60 tablet 5  . furosemide (LASIX) 40 MG tablet TAKE 1 TABLET DAILY AND EXTRA DOSE IF NEEDED 45 tablet 9  . INVOKANA 100 MG TABS tablet Take 100 mg by mouth daily.     Marland Kitchen lisinopril (PRINIVIL,ZESTRIL) 40 MG tablet Take 40 mg by mouth daily.    . Melatonin 5 MG TABS Take 1 tablet by mouth at bedtime.    . metFORMIN (GLUCOPHAGE) 500 MG tablet Take 500 mg by mouth 2 (two) times daily with a meal.     . sitaGLIPtin (JANUVIA) 100 MG tablet Take 100 mg by mouth daily.    Marland Kitchen spironolactone (ALDACTONE) 25 MG tablet TAKE 1 TABLET (25 MG) BY MOUTH DAILY. 30 tablet 3  . TIKOSYN 250 MCG capsule TAKE 1 CAPSULE BY MOUTH 2 TIMES DAILY. 60 capsule 11  . warfarin (COUMADIN) 5 MG tablet TAKE AS DIRECTED BY COUMADIN CLINIC 40 tablet 3   No current facility-administered medications for this visit.    No Known Allergies  Review of Systems negative except from HPI and PMH  Physical Exam BP 104/66 mmHg  Pulse 71  Ht  (1.778 m)  Wt 226 lb 6.4 oz (102.694 kg)  BMI 32.48 kg/m2 Well developed and well nourished in no acute distress HENT normal  E scleral and icterus clear Neck Supple JVP 7-8  carotids brisk and full Clear to ausculation The patient's device was interrogated.  The information was reviewed. No changes were made in the programming.    Regular rate and rhythm, 2/6 systolic murmur diminished S1 and present S4  Loud s2 Soft with active bowel sounds No clubbing cyanosis   Tr Edema Alert and oriented, grossly normal motor and sensory function Skin Warm and Dry    ECG demonstrates AV pacing with a configuration consistent with biventricular pacing upright QRS in V1 down QRS lead 1 frequent PVCs with a left bundle Lloyd inferior axis  Assessment and  Plan  Nonischemic cardiomyopathy  Aortic stenosis S./P. bioprosthetic  replacement  Implantable defibrillator-CRT-[employer]  The patient's device was interrogated and the information was fully reviewed.  The device was reprogrammed to  shorten the AV delay and to eliminate diaphragmatic stimulation by creating a vector from the LV tip to RV coil  Congestive heart.-Chronic mixed   PVCs  Atrial fibrillation-dofetilide  AAA note was reviewed from 11/15. Recommendation for repeat was 3 years.   There have been no intercurrent atrial fibrillation.  We will check his dofetilide surveillance laboratories   He is mildly volume overloaded but he has not been taking his diuretics.  Continue current occasions for his cardiomyopathy and check potassium levels today on his Aldactone  He has a history of a prior very abnormal sleep study. We will ask his primary care doctor to consider repeating that.  With his frequent PVCs, comprising 15-20%, question is has an impact on LV function. He has no symptoms although he is a little bit volume overloaded side have some concern. We will repeat his echocardiogram.

## 2015-03-10 ENCOUNTER — Other Ambulatory Visit: Payer: Self-pay

## 2015-03-10 ENCOUNTER — Ambulatory Visit (HOSPITAL_COMMUNITY): Payer: Medicare Other | Attending: Cardiology

## 2015-03-10 DIAGNOSIS — E669 Obesity, unspecified: Secondary | ICD-10-CM | POA: Diagnosis not present

## 2015-03-10 DIAGNOSIS — I428 Other cardiomyopathies: Secondary | ICD-10-CM

## 2015-03-10 DIAGNOSIS — I517 Cardiomegaly: Secondary | ICD-10-CM | POA: Diagnosis not present

## 2015-03-10 DIAGNOSIS — I429 Cardiomyopathy, unspecified: Secondary | ICD-10-CM

## 2015-03-10 DIAGNOSIS — I10 Essential (primary) hypertension: Secondary | ICD-10-CM | POA: Insufficient documentation

## 2015-03-10 DIAGNOSIS — Z6832 Body mass index (BMI) 32.0-32.9, adult: Secondary | ICD-10-CM | POA: Insufficient documentation

## 2015-03-10 DIAGNOSIS — I5022 Chronic systolic (congestive) heart failure: Secondary | ICD-10-CM | POA: Diagnosis not present

## 2015-03-10 DIAGNOSIS — E119 Type 2 diabetes mellitus without complications: Secondary | ICD-10-CM | POA: Diagnosis not present

## 2015-03-10 DIAGNOSIS — I77810 Thoracic aortic ectasia: Secondary | ICD-10-CM | POA: Insufficient documentation

## 2015-03-11 NOTE — Progress Notes (Signed)
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 Note a cardiac catheterization in April of 2009 prior to his procedure showed an ejection fraction of 20%, no coronary disease, a dilated aortic root, moderate aortic stenosis and mild mitral regurgitation. The patient subsequently had aortic valve replacement with 25 mm Carpentier-Edwards valve, replacement of ascending aortic aneurysm with 38-mm Hemishield graft, modified Cox maze III. Note he did develop atrial fibrillation 6 months after his procedure. A cardioversion was attempted by his report but he only held sinus for one week. Patient had CRT-D on December 2 of 2011. He was placed on tikosyn for his atrial fibrillation/flutter. His dose was decreased because of increasing QT. CTA of the thoracic aorta in November 2014 showed normal patency of the aortic root graft with no aneurysmal disease of the rest of the thoracic aorta. 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 Follow-up recommended 3 years. Last echocardiogram December 2016 showed normal LV function, bioprosthetic aortic valve with mean gradient 12 mmHg, aortic root 38 mm. Since I last saw him, the patient has dyspnea with more extreme activities but not with routine activities. It is relieved with rest. It is not associated with chest pain. There is no orthopnea, PND or pedal edema. There is no syncope or palpitations. There is no exertional chest pain.   Current Outpatient Prescriptions  Medication Sig Dispense Refill  . amoxicillin (AMOXIL) 500 MG capsule Take 4 capsules (2,000 mg total) by mouth once as needed (for dental procedures). 4 capsule 3  . carvedilol (COREG) 25 MG tablet TAKE 1 TABLET BY MOUTH TWICE A DAY WITH A MEAL. 60 tablet 5  . furosemide (LASIX) 40 MG tablet TAKE 1 TABLET DAILY AND EXTRA DOSE IF NEEDED 45  tablet 9  . INVOKANA 300 MG TABS tablet Take 300 mg by mouth daily.  12  . lisinopril (PRINIVIL,ZESTRIL) 40 MG tablet Take 40 mg by mouth daily.    . Melatonin 5 MG TABS Take 1 tablet by mouth at bedtime.    . metFORMIN (GLUCOPHAGE) 500 MG tablet Take 500 mg by mouth 2 (two) times daily with a meal.     . sitaGLIPtin (JANUVIA) 100 MG tablet Take 100 mg by mouth daily.    Marland Kitchen spironolactone (ALDACTONE) 25 MG tablet TAKE 1 TABLET (25 MG) BY MOUTH DAILY. 30 tablet 3  . TIKOSYN 250 MCG capsule TAKE 1 CAPSULE BY MOUTH 2 TIMES DAILY. 60 capsule 11  . warfarin (COUMADIN) 5 MG tablet TAKE AS DIRECTED BY COUMADIN CLINIC 40 tablet 3   No current facility-administered medications for this visit.     Past Medical History  Diagnosis Date  . LBBB (left bundle branch block)   . Atrial fibrillation/flutter   . Hypertension   . Aortic stenosis/ bicuspid valve--s/p AVR   . Ascending aortic aneurysm (HCC)   . DJD (degenerative joint disease)   . Biventricular implantable cardiac defibrillator- BSX   . OSA (obstructive sleep apnea)   . Cholelithiases   . Non-ischemic cardiomyopathy (HCC)   . Diabetes mellitus without complication [facility])     Past Surgical History  Procedure Laterality Date  . Median sternotomy    . Aortic valve replacement    . Appendectomy    . Tonsillectomy    . Cervical discectomy  5/01    C7  . Cholecystectomy    .  Ep implantable device N/A 11/20/2014    Procedure:  ICD Generator Changeout;  Surgeon: Duke Salvia, MD;  Location: [facility] INVASIVE CV LAB;  Service: Cardiovascular;  Laterality: N/A;    Social History   Social History  . Marital Status: Married    Spouse Name: N/A  . Number of Children: 2  . Years of Education: N/A   Occupational History  . retired Art gallery manager    Social History Main Topics  . Smoking status: Former Games developer  . Smokeless tobacco: Not on file  . Alcohol Use: No  . Drug Use: Not on file  . Sexual Activity: Not on file   Other Topics Concern    . Not on file   Social History Narrative    ROS: no fevers or chills, productive cough, hemoptysis, dysphasia, odynophagia, melena, hematochezia, dysuria, hematuria, rash, seizure activity, orthopnea, PND, pedal edema, claudication. Remaining systems are negative.  Physical Exam: Well-developed well-nourished in no acute distress.  Skin is warm and dry.  HEENT is normal.  Neck is supple.  Chest is clear to auscultation with normal expansion.  Cardiovascular exam is regular rate and rhythm. 2/6 systolic murmur left sternal border. Abdominal exam nontender or distended. No masses palpated. Extremities show no edema. neuro grossly intact  ECG Ventricular pacing.

## 2015-03-12 ENCOUNTER — Encounter: Payer: Self-pay | Admitting: Cardiology

## 2015-03-12 ENCOUNTER — Ambulatory Visit (INDEPENDENT_AMBULATORY_CARE_PROVIDER_SITE_OTHER): Payer: Medicare Other | Admitting: Cardiology

## 2015-03-12 VITALS — BP 104/66 | HR 70 | Ht 70.0 in | Wt 222.4 lb

## 2015-03-12 DIAGNOSIS — I4891 Unspecified atrial fibrillation: Secondary | ICD-10-CM | POA: Diagnosis not present

## 2015-03-12 DIAGNOSIS — I429 Cardiomyopathy, unspecified: Secondary | ICD-10-CM | POA: Diagnosis not present

## 2015-03-12 DIAGNOSIS — I712 Thoracic aortic aneurysm, without rupture, unspecified: Secondary | ICD-10-CM

## 2015-03-12 DIAGNOSIS — I714 Abdominal aortic aneurysm, without rupture, unspecified: Secondary | ICD-10-CM

## 2015-03-12 DIAGNOSIS — Z95810 Presence of automatic (implantable) cardiac defibrillator: Secondary | ICD-10-CM

## 2015-03-12 DIAGNOSIS — I428 Other cardiomyopathies: Secondary | ICD-10-CM

## 2015-03-12 NOTE — Assessment & Plan Note (Signed)
Continue SBE prophylaxis. 

## 2015-03-12 NOTE — Assessment & Plan Note (Addendum)
Last echocardiogram shows thoracic aorta at 3.8 cm.

## 2015-03-12 NOTE — Assessment & Plan Note (Signed)
Follow by electrophysiology. 

## 2015-03-12 NOTE — Patient Instructions (Signed)
Your physician wants you to follow-up in: ONE YEAR WITH DR CRENSHAW You will receive a reminder letter in the mail two months in advance. If you don't receive a letter, please call our office to schedule the follow-up appointment.   If you need a refill on your cardiac medications before your next appointment, please call your pharmacy.  

## 2015-03-12 NOTE — Assessment & Plan Note (Signed)
Follow-up abdominal ultrasound November 2018.

## 2015-03-12 NOTE — Assessment & Plan Note (Signed)
LV function improved on most recent echocardiogram. Continue ARB, beta blocker and present dose of diuretics.

## 2015-03-12 NOTE — Assessment & Plan Note (Signed)
Blood pressure controlled. Continue present medications. 

## 2015-03-12 NOTE — Assessment & Plan Note (Signed)
Continue tikosyn, beta blocker and Coumadin.

## 2015-03-14 ENCOUNTER — Encounter: Payer: Self-pay | Admitting: Podiatry

## 2015-03-14 ENCOUNTER — Ambulatory Visit (INDEPENDENT_AMBULATORY_CARE_PROVIDER_SITE_OTHER): Payer: Medicare Other | Admitting: Podiatry

## 2015-03-14 VITALS — BP 122/67 | HR 70 | Resp 18

## 2015-03-14 DIAGNOSIS — B351 Tinea unguium: Secondary | ICD-10-CM

## 2015-03-16 NOTE — Progress Notes (Signed)
Patient ID: ISLAM DELLACROCE, male   DOB: 05/08/1940, 74 y.o.   MRN: [id number]  Subjective: 74 year old male presents the office today for laser treatment #3.  He states he is also continue with an over-the-counter topical medication as well. He states he has noticed some clearing of the nails lady looks somewhat better. He denies any pain to the toenails and advised any redness or swelling or other signs of infection. No other complaints at this time in no acute changes since last appointment.  Objective: AAO x 3, NAD Neurovascular status unchanged Nails are hypertrophic, dystrophic, discolored, brittle 10. There is no surrounding erythema or drainage. There is no tenderness the patient on the nails at this time.  there does continue be some evidence of clearing on the proximal nail borders and subjectively he states that he has noticed a difference with the laser treatment. There is no tenderness well patient, nails are no surrounding erythema, drainage or edema.  No open lesions or pre-ulcerative lesions. No pain or calf compression, swelling, warmth, erythema.  Assessment: 74 year old male with onychomycosis  Plan: -Treatment options discussed including all alternatives, risks, and complications -I discussed with him laser treatment of this is not a guarantee to get clearing of the toenails. He understands this and wishes to proceed.  Laserering of Toenails was carried out at today's visit via Q-switch YAG laser by QClear Laser at continuous on the 10 digit toenails.  Patient and staff were wearing appropriate laser protective goggles/eyewear Laser device was tested prior to use and safety protocols were followed Frequency of 5 Hz, Level 4, Joules 1.3 delivered. The patient tolerated the lasering well without any complications. They were encouraged to call the office with any questions, concerns, change in symptoms. -Follow-up 6 month for repeat laser or sooner if any problems arise. In the  meantime, encouraged to call the office with any questions, concerns, change in symptoms. *debridement of nails prior to laser  Ovid Curd, DPM

## 2015-03-21 ENCOUNTER — Ambulatory Visit (INDEPENDENT_AMBULATORY_CARE_PROVIDER_SITE_OTHER): Payer: Medicare Other | Admitting: *Deleted

## 2015-03-21 DIAGNOSIS — I4891 Unspecified atrial fibrillation: Secondary | ICD-10-CM | POA: Diagnosis not present

## 2015-03-21 DIAGNOSIS — Z5181 Encounter for therapeutic drug level monitoring: Secondary | ICD-10-CM | POA: Diagnosis not present

## 2015-03-21 LAB — POCT INR: INR: 1.8

## 2015-04-08 ENCOUNTER — Ambulatory Visit (INDEPENDENT_AMBULATORY_CARE_PROVIDER_SITE_OTHER): Payer: Medicare Other | Admitting: Podiatry

## 2015-04-08 ENCOUNTER — Ambulatory Visit (INDEPENDENT_AMBULATORY_CARE_PROVIDER_SITE_OTHER): Payer: Medicare Other | Admitting: *Deleted

## 2015-04-08 ENCOUNTER — Encounter: Payer: Self-pay | Admitting: Podiatry

## 2015-04-08 DIAGNOSIS — Z5181 Encounter for therapeutic drug level monitoring: Secondary | ICD-10-CM

## 2015-04-08 DIAGNOSIS — M79676 Pain in unspecified toe(s): Secondary | ICD-10-CM | POA: Diagnosis not present

## 2015-04-08 DIAGNOSIS — I4891 Unspecified atrial fibrillation: Secondary | ICD-10-CM | POA: Diagnosis not present

## 2015-04-08 DIAGNOSIS — B351 Tinea unguium: Secondary | ICD-10-CM | POA: Diagnosis not present

## 2015-04-08 LAB — POCT INR: INR: 3.2

## 2015-04-08 MED FILL — metFORMIN HCL 500 MG TABS: 500 | 30 days supply | Qty: 60 | Fill #4

## 2015-04-08 MED FILL — FUROSEMIDE 40 MG TABLET: 40 | 30 days supply | Qty: 45 | Fill #6

## 2015-04-08 NOTE — Progress Notes (Signed)
Patient ID: [name], male   DOB: 26-Dec-1940, 75 y.o.   MRN: [id number] Complaint:  Visit Type: Patient returns to my office for continued preventative foot care services. Complaint: Patient states" my nails have grown long and thick and become painful to walk and wear shoes"  The patient presents for preventative foot care services. He presents for care of his nails since he is in the middle of laser treatment of his nails.  Podiatric Exam: Vascular: dorsalis pedis and posterior tibial pulses are palpable bilateral. Capillary return is immediate. Temperature gradient is WNL. Skin turgor WNL  Sensorium: Normal Semmes Weinstein monofilament test. Normal tactile sensation bilaterally. Nail Exam: Pt has thick disfigured discolored nails with subungual debris noted bilateral entire nail hallux through fifth toenails Ulcer Exam: There is no evidence of ulcer or pre-ulcerative changes or infection. Orthopedic Exam: Muscle tone and strength are WNL. No limitations in general ROM. No crepitus or effusions noted. Foot type and digits show no abnormalities. Bony prominences are unremarkable. Skin: No Porokeratosis. No infection or ulcers  Diagnosis:  Onychomycosis, , Pain in right toe, pain in left toes  Treatment & Plan Procedures and Treatment: Consent by patient was obtained for treatment procedures. The patient understood the discussion of treatment and procedures well. All questions were answered thoroughly reviewed. Debridement of mycotic and hypertrophic toenails, 1 through 5 bilateral and clearing of subungual debris. No ulceration, no infection noted.  Return Visit-Office Procedure: Patient instructed to return to the office for a follow up visit 3 months for continued evaluation and treatment.   Helane Gunther DPM

## 2015-04-16 MED FILL — metFORMIN HCL 1000 MG TABS: 1000 | 30 days supply | Qty: 60 | Fill #0

## 2015-04-17 MED FILL — DOFETILIDE 250 MCG CAPSULE: 250 | 30 days supply | Qty: 60 | Fill #5

## 2015-04-21 MED FILL — SPIRONOLACTONE 25 MG TABLET: 25 | 30 days supply | Qty: 30 | Fill #2

## 2015-04-22 ENCOUNTER — Ambulatory Visit (INDEPENDENT_AMBULATORY_CARE_PROVIDER_SITE_OTHER): Payer: Medicare Other | Admitting: *Deleted

## 2015-04-22 DIAGNOSIS — Z5181 Encounter for therapeutic drug level monitoring: Secondary | ICD-10-CM | POA: Diagnosis not present

## 2015-04-22 DIAGNOSIS — I4891 Unspecified atrial fibrillation: Secondary | ICD-10-CM | POA: Diagnosis not present

## 2015-04-22 LAB — POCT INR: INR: 2.4

## 2015-04-28 MED FILL — ONETOUCH VERIO TEST STRIP: 90 days supply | Qty: 100 | Fill #1

## 2015-05-05 MED FILL — JANUVIA 100 MG TABLET: 100 | 30 days supply | Qty: 30 | Fill #5

## 2015-05-05 MED FILL — INVOKANA 300 MG TABLET: 300 | 30 days supply | Qty: 30 | Fill #1

## 2015-05-12 MED FILL — WARFARIN SODIUM 5 MG TABLET: 5 | 30 days supply | Qty: 40 | Fill #3

## 2015-05-14 ENCOUNTER — Ambulatory Visit (INDEPENDENT_AMBULATORY_CARE_PROVIDER_SITE_OTHER): Payer: Medicare Other | Admitting: *Deleted

## 2015-05-14 DIAGNOSIS — Z5181 Encounter for therapeutic drug level monitoring: Secondary | ICD-10-CM | POA: Diagnosis not present

## 2015-05-14 DIAGNOSIS — I4891 Unspecified atrial fibrillation: Secondary | ICD-10-CM | POA: Diagnosis not present

## 2015-05-14 LAB — POCT INR: INR: 2.2

## 2015-05-19 MED FILL — CARVEDILOL 25 MG TABLET: 25 | 30 days supply | Qty: 60 | Fill #5

## 2015-05-19 MED FILL — DOFETILIDE 250 MCG CAPSULE: 250 | 30 days supply | Qty: 60 | Fill #6

## 2015-05-26 ENCOUNTER — Ambulatory Visit (INDEPENDENT_AMBULATORY_CARE_PROVIDER_SITE_OTHER): Payer: Medicare Other | Admitting: *Deleted

## 2015-05-26 ENCOUNTER — Telehealth: Payer: Self-pay | Admitting: Cardiology

## 2015-05-26 DIAGNOSIS — I429 Cardiomyopathy, unspecified: Secondary | ICD-10-CM

## 2015-05-26 DIAGNOSIS — I428 Other cardiomyopathies: Secondary | ICD-10-CM

## 2015-05-26 NOTE — Telephone Encounter (Signed)
LMOVM reminding pt to send remote transmission.   

## 2015-05-27 ENCOUNTER — Encounter: Payer: Self-pay | Admitting: Internal Medicine

## 2015-05-28 NOTE — Progress Notes (Signed)
Remote ICD transmission.   

## 2015-05-30 MED FILL — LISINOPRIL 40 MG TABLET: 40 | 90 days supply | Qty: 90 | Fill #2

## 2015-05-30 MED FILL — SPIRONOLACTONE 25 MG TABLET: 25 | 30 days supply | Qty: 30 | Fill #3

## 2015-05-30 MED FILL — metFORMIN HCL 1000 MG TABS: 1000 | 30 days supply | Qty: 60 | Fill #1

## 2015-05-30 MED FILL — JANUVIA 100 MG TABLET: 100 | 30 days supply | Qty: 30 | Fill #0

## 2015-06-11 ENCOUNTER — Other Ambulatory Visit: Payer: Self-pay | Admitting: Cardiology

## 2015-06-11 MED FILL — FUROSEMIDE 40 MG TABLET: 40 | 30 days supply | Qty: 45 | Fill #0

## 2015-06-11 MED FILL — INVOKANA 300 MG TABLET: 300 | 30 days supply | Qty: 30 | Fill #2

## 2015-06-11 NOTE — Telephone Encounter (Signed)
REFILL 

## 2015-06-17 ENCOUNTER — Ambulatory Visit (INDEPENDENT_AMBULATORY_CARE_PROVIDER_SITE_OTHER): Payer: Medicare Other | Admitting: *Deleted

## 2015-06-17 DIAGNOSIS — I4891 Unspecified atrial fibrillation: Secondary | ICD-10-CM

## 2015-06-17 DIAGNOSIS — Z5181 Encounter for therapeutic drug level monitoring: Secondary | ICD-10-CM | POA: Diagnosis not present

## 2015-06-17 LAB — POCT INR: INR: 2.1

## 2015-06-18 ENCOUNTER — Other Ambulatory Visit: Payer: Self-pay | Admitting: Cardiology

## 2015-06-18 MED FILL — DOFETILIDE 250 MCG CAPSULE: 250 | 30 days supply | Qty: 60 | Fill #7

## 2015-06-18 MED FILL — WARFARIN SODIUM 5 MG TABLET: 5 | 27 days supply | Qty: 40 | Fill #0

## 2015-06-19 ENCOUNTER — Telehealth: Payer: Self-pay | Admitting: *Deleted

## 2015-06-19 ENCOUNTER — Ambulatory Visit (HOSPITAL_COMMUNITY)
Admission: RE | Admit: 2015-06-19 | Discharge: 2015-06-19 | Disposition: A | Discharge status: Home / Self Care | Payer: Medicare Other | Source: Ambulatory Visit | Attending: Internal Medicine | Admitting: Internal Medicine

## 2015-06-19 DIAGNOSIS — T82190A Other mechanical complication of cardiac electrode, initial encounter: Secondary | ICD-10-CM | POA: Insufficient documentation

## 2015-06-19 DIAGNOSIS — Y838 Other surgical procedures as the cause of abnormal reaction of the patient, or of later complication, without mention of misadventure at the time of the procedure: Secondary | ICD-10-CM | POA: Diagnosis not present

## 2015-06-19 LAB — CUP PACEART REMOTE DEVICE CHECK
Implantable Lead Implant Date: 20111201
Implantable Lead Location: 753859
Implantable Lead Location: 753860
Implantable Lead Model: 5076
MDC IDC LEAD IMPLANT DT: 20111201
MDC IDC LEAD IMPLANT DT: 20111201
MDC IDC LEAD LOCATION: 753858
MDC IDC LEAD MODEL: 158
MDC IDC LEAD MODEL: 4396
MDC IDC LEAD SERIAL: 302703
MDC IDC PG SERIAL: 113381
MDC IDC SESS DTM: 20170316113009
MDC IDC STAT BRADY RA PERCENT PACED: 82 %
MDC IDC STAT BRADY RV PERCENT PACED: 72 %

## 2015-06-19 NOTE — Telephone Encounter (Signed)
Remote transmission reviewed. Decreased biv pacing. Atrial signal appears to be falling on top of RV sense- questionable atrial lead dislodgment. CXR ordered per policy. Will review with SK.  Patient made aware that there was a change seen in his device and that a CXR is needed to evaluate placement of the leads. He verbalizes understanding. He does not have a vehicle to get to [facility] today but will go first thing in the morning. He reports that he has been feeling great, no symptoms of HF noted.

## 2015-06-20 ENCOUNTER — Encounter: Payer: Self-pay | Admitting: Cardiology

## 2015-06-23 ENCOUNTER — Encounter: Payer: Self-pay | Admitting: Internal Medicine

## 2015-06-23 ENCOUNTER — Ambulatory Visit (INDEPENDENT_AMBULATORY_CARE_PROVIDER_SITE_OTHER): Payer: Medicare Other | Admitting: Internal Medicine

## 2015-06-23 VITALS — BP 104/60 | HR 70 | Ht 70.0 in | Wt 226.4 lb

## 2015-06-23 DIAGNOSIS — I429 Cardiomyopathy, unspecified: Secondary | ICD-10-CM

## 2015-06-23 DIAGNOSIS — I480 Paroxysmal atrial fibrillation: Secondary | ICD-10-CM | POA: Diagnosis not present

## 2015-06-23 DIAGNOSIS — Z95810 Presence of automatic (implantable) cardiac defibrillator: Secondary | ICD-10-CM | POA: Diagnosis not present

## 2015-06-23 DIAGNOSIS — I428 Other cardiomyopathies: Secondary | ICD-10-CM

## 2015-06-23 DIAGNOSIS — I5022 Chronic systolic (congestive) heart failure: Secondary | ICD-10-CM | POA: Diagnosis not present

## 2015-06-23 LAB — CUP PACEART INCLINIC DEVICE CHECK
HIGH POWER IMPEDANCE MEASURED VALUE: 52 Ohm
Implantable Lead Implant Date: 20111201
Implantable Lead Implant Date: 20111201
Implantable Lead Implant Date: 20111201
Implantable Lead Location: 753859
Implantable Lead Location: 753860
Implantable Lead Model: 158
Implantable Lead Model: 4396
Implantable Lead Serial Number: 302703
Lead Channel Impedance Value: 782 Ohm
Lead Channel Impedance Value: 971 Ohm
Lead Channel Pacing Threshold Amplitude: 0.8 V
Lead Channel Pacing Threshold Amplitude: 1.7 V
Lead Channel Pacing Threshold Pulse Width: 0.4 ms
Lead Channel Pacing Threshold Pulse Width: 1.5 ms
Lead Channel Sensing Intrinsic Amplitude: 25 mV
Lead Channel Setting Pacing Amplitude: 2.2 V
Lead Channel Setting Pacing Pulse Width: 1.5 ms
Lead Channel Setting Sensing Sensitivity: 0.5 mV
Lead Channel Setting Sensing Sensitivity: 1 mV
MDC IDC LEAD LOCATION: 753858
MDC IDC MSMT LEADCHNL LV SENSING INTR AMPL: 15.2 mV
MDC IDC MSMT LEADCHNL RA IMPEDANCE VALUE: 711 Ohm
MDC IDC MSMT LEADCHNL RA PACING THRESHOLD AMPLITUDE: 0.9 V
MDC IDC MSMT LEADCHNL RA SENSING INTR AMPL: 2.2 mV
MDC IDC MSMT LEADCHNL RV PACING THRESHOLD PULSEWIDTH: 0.4 ms
MDC IDC PG SERIAL: 113381
MDC IDC SESS DTM: 20170320040000
MDC IDC SET LEADCHNL RA PACING AMPLITUDE: 2 V
MDC IDC SET LEADCHNL RV PACING AMPLITUDE: 2.4 V
MDC IDC SET LEADCHNL RV PACING PULSEWIDTH: 0.4 ms
MDC IDC STAT BRADY RA PERCENT PACED: 76 %
MDC IDC STAT BRADY RV PERCENT PACED: 66 %

## 2015-06-23 LAB — BASIC METABOLIC PANEL
BUN: 47 mg/dL — AB (ref 7–25)
CALCIUM: 9.1 mg/dL (ref 8.6–10.3)
CO2: 23 mmol/L (ref 20–31)
CREATININE: 1.62 mg/dL — AB (ref 0.70–1.18)
Chloride: 102 mmol/L (ref 98–110)
Glucose, Bld: 185 mg/dL — ABNORMAL HIGH (ref 65–99)
Potassium: 4.8 mmol/L (ref 3.5–5.3)
Sodium: 138 mmol/L (ref 135–146)

## 2015-06-23 LAB — MAGNESIUM: MAGNESIUM: 2.3 mg/dL (ref 1.5–2.5)

## 2015-06-23 NOTE — Progress Notes (Signed)
Patient Care Team: Gaspar Garbe, MD as PCP - General   HPI  [name] is a 75 y.o. male Seen in followup for CRT-D implantation in the fall 2011. He also had a history of atrial flutter. In 2009 he underwent  porcine aortic valve replacement with 25 mm Carpentier-Edwards valve, replacement of ascending aortic aneurysm with 38-mm Hemishield graft, as well as Cox maze procedure for atrial fibrillation. He takes dofetilide.  He underwent device replacement 8/16   He was noted on remote interrogation to have atrial far field signal that was much larger than the A;  chest x-ray was obtained last week demonstrating stable position   Cardiac catheterization in April of 2009>> s ejection fraction of 20%, no coronary disease,  Echocardiogram performed  814 demonstrated interval improvement 30-35---50%.  The patient denies th  chest pain, shortness of breath, nocturnal dyspnea, orthopnea ; he does have mild variable peripheral edema.  There have been no palpitations, lightheadedness or syncope.  He has a prior istory of sleep apnea. He does not sleep well. He sleeps in a chair. he this was an abnormal appointment to       Past Medical History  Diagnosis Date  . LBBB (left bundle branch block)   . Atrial fibrillation/flutter   . Hypertension   . Aortic stenosis/ bicuspid valve--s/p AVR   . Ascending aortic aneurysm (HCC)   . DJD (degenerative joint disease)   . Biventricular implantable cardiac defibrillator- BSX   . OSA (obstructive sleep apnea)   . Cholelithiases   . Non-ischemic cardiomyopathy (HCC)   . Diabetes mellitus without complication [facility])     Past Surgical History  Procedure Laterality Date  . Median sternotomy    . Aortic valve replacement    . Appendectomy    . Tonsillectomy    . Cervical discectomy  5/01    C7  . Cholecystectomy    . Ep implantable device N/A 11/20/2014    Procedure:  ICD Generator Changeout;  Surgeon: Duke Salvia, MD;   Location: [facility] INVASIVE CV LAB;  Service: Cardiovascular;  Laterality: N/A;    Current Outpatient Prescriptions  Medication Sig Dispense Refill  . amoxicillin (AMOXIL) 500 MG capsule Take 4 capsules (2,000 mg total) by mouth once as needed (for dental procedures). 4 capsule 3  . carvedilol (COREG) 25 MG tablet TAKE 1 TABLET BY MOUTH TWICE A DAY WITH A MEAL. 60 tablet 5  . furosemide (LASIX) 40 MG tablet TAKE 1 TABLET BY MOUTH DAILY AND EXTRA DOSE IF NEEDED 45 tablet 11  . INVOKANA 300 MG TABS tablet Take 300 mg by mouth daily.  12  . lisinopril (PRINIVIL,ZESTRIL) 40 MG tablet Take 40 mg by mouth daily.    . Melatonin 5 MG TABS Take 1 tablet by mouth at bedtime.    . metFORMIN (GLUCOPHAGE) 1000 MG tablet Take 1,000 mg by mouth 2 (two) times daily.   4  . sitaGLIPtin (JANUVIA) 100 MG tablet Take 100 mg by mouth daily.    Marland Kitchen spironolactone (ALDACTONE) 25 MG tablet TAKE 1 TABLET (25 MG) BY MOUTH DAILY. 30 tablet 3  . TIKOSYN 250 MCG capsule TAKE 1 CAPSULE BY MOUTH 2 TIMES DAILY. 60 capsule 11  . warfarin (COUMADIN) 5 MG tablet Take 1-1.5 tablets (5-7.5 mg total) by mouth daily. 40 tablet 3   No current facility-administered medications for this visit.    No Known Allergies  Review of Systems negative except from HPI and PMH  Physical Exam BP 104/60 mmHg  Pulse 70  Ht  (1.778 m)  Wt 226 lb 6.4 oz (102.694 kg)  BMI 32.48 kg/m2 Well developed and well nourished in no acute distress HENT normal E scleral and icterus clear Neck Supple JVP 7-8  carotids brisk and full Clear to ausculation The patient's device was interrogated.  The information was reviewed. No changes were made in the programming.    Regular rate and rhythm, 2/6 systolic murmur diminished S1 and present S4  Loud s2 Soft with active bowel sounds No clubbing cyanosis   Tr Edema Alert and oriented, grossly normal motor and sensory function Skin Warm and Dry    ECG demonstrates AV pacing with a configuration  consistent with biventricular pacing upright QRS in V1 down QRS lead 1 frequent PVCs with a left bundle Lloyd inferior axis  Assessment and  Plan  Nonischemic cardiomyopathy  Aortic stenosis S./P. bioprosthetic replacement  Implantable defibrillator-CRT-[employer]  The patient's device was interrogated and the information was fully reviewed.  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 Interrogation demonstrated that there was a double component atrial signal with the intrinsic first signal being very small and the intrinsic second signal being dominant. The first signal however initiated atrial capture and AV conduction so that simultaneous with a large atrial signal was the intrinsic lead conducted QRS. We'll increase his base rate 70-75.We spent more than 50% of our >25 min visit in face to face counseling regarding the above  We will check surveillance laboratories today on dofetilide.

## 2015-06-23 NOTE — Patient Instructions (Signed)
Medication Instructions: - Your physician recommends that you continue on your current medications as directed. Please refer to the Current Medication list given to you today.  Labwork: - Your physician recommends that you have lab work today: BMP/ Magnesium  Procedures/Testing: - none  Follow-Up: - Remote monitoring is used to monitor your Pacemaker of ICD from home. This monitoring reduces the number of office visits required to check your device to one time per year. It allows [place] to keep an eye on the functioning of your device to ensure it is working properly. You are scheduled for a device check from home on 09/22/15. You may send your transmission at any time that day. If you have a wireless device, the transmission will be sent automatically. After your physician reviews your transmission, you will receive a postcard with your next transmission date.  - Your physician wants you to follow-up in:  November 2017 with Dr. Graciela Husbands. You will receive a reminder letter in the mail two months in advance. If you don't receive a letter, please call our office to schedule the follow-up appointment.  Any Additional Special Instructions Will Be Listed Below (If Applicable).     If you need a refill on your cardiac medications before your next appointment, please call your pharmacy.

## 2015-07-01 MED FILL — metFORMIN HCL 1000 MG TABS: 1000 | 30 days supply | Qty: 60 | Fill #2

## 2015-07-01 MED FILL — JANUVIA 100 MG TABLET: 100 | 30 days supply | Qty: 30 | Fill #1

## 2015-07-04 ENCOUNTER — Other Ambulatory Visit: Payer: Self-pay | Admitting: *Deleted

## 2015-07-04 DIAGNOSIS — Z79899 Other long term (current) drug therapy: Secondary | ICD-10-CM

## 2015-07-04 MED FILL — INVOKANA 300 MG TABLET: 300 | 30 days supply | Qty: 30 | Fill #3

## 2015-07-04 MED FILL — ALLOPURINOL 100 MG TABLET: 100 | 90 days supply | Qty: 90 | Fill #0

## 2015-07-07 ENCOUNTER — Telehealth: Payer: Self-pay | Admitting: Internal Medicine

## 2015-07-07 NOTE — Telephone Encounter (Signed)
New message  Pt c/o medication issue: 1. Name of Medication: Xyloprim 100 mg once a day   4. What is your medication issue? Please call back to discuss medications

## 2015-07-07 NOTE — Telephone Encounter (Signed)
I left a message for the patient to call. 

## 2015-07-08 ENCOUNTER — Other Ambulatory Visit: Payer: Self-pay | Admitting: Cardiology

## 2015-07-08 MED FILL — SPIRONOLACTONE 25 MG TABLET: 25 | 30 days supply | Qty: 30 | Fill #0

## 2015-07-08 NOTE — Telephone Encounter (Signed)
Rx refill sent to pharmacy. 

## 2015-07-11 NOTE — Telephone Encounter (Signed)
I left a message for the patient to call. 

## 2015-07-15 ENCOUNTER — Other Ambulatory Visit: Payer: Self-pay | Admitting: Cardiology

## 2015-07-15 MED FILL — CARVEDILOL 25 MG TABLET: 25 | 30 days supply | Qty: 60 | Fill #0

## 2015-07-15 NOTE — Telephone Encounter (Signed)
REFILL 

## 2015-07-18 ENCOUNTER — Other Ambulatory Visit: Payer: Self-pay | Admitting: Internal Medicine

## 2015-07-18 ENCOUNTER — Other Ambulatory Visit (INDEPENDENT_AMBULATORY_CARE_PROVIDER_SITE_OTHER): Payer: Medicare Other | Admitting: *Deleted

## 2015-07-18 DIAGNOSIS — Z79899 Other long term (current) drug therapy: Secondary | ICD-10-CM

## 2015-07-18 LAB — BASIC METABOLIC PANEL
BUN: 27 mg/dL — ABNORMAL HIGH (ref 7–25)
CALCIUM: 9.3 mg/dL (ref 8.6–10.3)
CO2: 25 mmol/L (ref 20–31)
Chloride: 103 mmol/L (ref 98–110)
Creat: 1.3 mg/dL — ABNORMAL HIGH (ref 0.70–1.18)
GLUCOSE: 102 mg/dL — AB (ref 65–99)
Potassium: 4.5 mmol/L (ref 3.5–5.3)
SODIUM: 137 mmol/L (ref 135–146)

## 2015-07-22 MED FILL — DOFETILIDE 250 MCG CAPSULE: 250 | 30 days supply | Qty: 60 | Fill #8

## 2015-07-28 MED FILL — WARFARIN SODIUM 5 MG TABLET: 5 | 27 days supply | Qty: 40 | Fill #1

## 2015-07-28 MED FILL — JANUVIA 100 MG TABLET: 100 | 30 days supply | Qty: 30 | Fill #2

## 2015-07-29 ENCOUNTER — Ambulatory Visit (INDEPENDENT_AMBULATORY_CARE_PROVIDER_SITE_OTHER): Payer: Medicare Other | Admitting: *Deleted

## 2015-07-29 DIAGNOSIS — Z5181 Encounter for therapeutic drug level monitoring: Secondary | ICD-10-CM | POA: Diagnosis not present

## 2015-07-29 DIAGNOSIS — I4891 Unspecified atrial fibrillation: Secondary | ICD-10-CM

## 2015-07-29 LAB — POCT INR: INR: 1.8

## 2015-08-04 MED FILL — SPIRONOLACTONE 25 MG TABLET: 25 | 30 days supply | Qty: 30 | Fill #1

## 2015-08-04 MED FILL — INVOKANA 300 MG TABLET: 300 | 30 days supply | Qty: 30 | Fill #4

## 2015-08-18 MED FILL — metFORMIN HCL 1000 MG TABS: 1000 | 30 days supply | Qty: 60 | Fill #3

## 2015-08-22 ENCOUNTER — Ambulatory Visit (INDEPENDENT_AMBULATORY_CARE_PROVIDER_SITE_OTHER): Payer: Medicare Other | Admitting: *Deleted

## 2015-08-22 DIAGNOSIS — Z5181 Encounter for therapeutic drug level monitoring: Secondary | ICD-10-CM

## 2015-08-22 DIAGNOSIS — I4891 Unspecified atrial fibrillation: Secondary | ICD-10-CM

## 2015-08-22 LAB — POCT INR: INR: 2.2

## 2015-09-02 MED FILL — LISINOPRIL 40 MG TABLET: 40 | 90 days supply | Qty: 90 | Fill #3

## 2015-09-02 MED FILL — JANUVIA 100 MG TABLET: 100 | 30 days supply | Qty: 30 | Fill #3

## 2015-09-02 MED FILL — INVOKANA 300 MG TABLET: 300 | 30 days supply | Qty: 30 | Fill #5

## 2015-09-05 MED FILL — WARFARIN SODIUM 5 MG TABLET: 5 | 27 days supply | Qty: 40 | Fill #2

## 2015-09-05 MED FILL — SPIRONOLACTONE 25 MG TABLET: 25 | 30 days supply | Qty: 30 | Fill #2

## 2015-09-12 ENCOUNTER — Telehealth: Payer: Self-pay | Admitting: Cardiology

## 2015-09-12 ENCOUNTER — Ambulatory Visit (INDEPENDENT_AMBULATORY_CARE_PROVIDER_SITE_OTHER): Payer: Medicare Other | Admitting: Podiatry

## 2015-09-12 ENCOUNTER — Ambulatory Visit (INDEPENDENT_AMBULATORY_CARE_PROVIDER_SITE_OTHER): Payer: Medicare Other

## 2015-09-12 ENCOUNTER — Encounter: Payer: Self-pay | Admitting: Podiatry

## 2015-09-12 DIAGNOSIS — I4891 Unspecified atrial fibrillation: Secondary | ICD-10-CM

## 2015-09-12 DIAGNOSIS — Z5181 Encounter for therapeutic drug level monitoring: Secondary | ICD-10-CM

## 2015-09-12 DIAGNOSIS — B351 Tinea unguium: Secondary | ICD-10-CM

## 2015-09-12 LAB — POCT INR: INR: 2.2

## 2015-09-12 NOTE — Telephone Encounter (Signed)
Pt calling about a bill a he received. Call transferred to billing manager.

## 2015-09-12 NOTE — Progress Notes (Signed)
Patient ID: [name], male   DOB: 02/01/1941, 75 y.o.   MRN: [id number]  Subjective: 75 year old male presents the office today for laser treatment #3.  He feels that the toenails have made improvement and there is been some clearing although does remain. No redness or drainage to the toenails at this time and no pain. No other complaints at this time in no acute changes since last appointment.  Objective: AAO x 3, NAD Neurovascular status unchanged Nails are hypertrophic, dystrophic, discolored, brittle 10. There is no surrounding erythema or drainage. There is no tenderness the patient on the nails at this time. There does appear to be some evidence of clearing on the proximal nails particularly of the hallux.   Assessment: 75 year old male with onychomycosis presents for laser  Plan: -Treatment options discussed including all alternatives, risks, and complications -I discussed with him laser treatment of this is not a guarantee to get clearing of the toenails. He understands this and wishes to proceed.  Laserering of Toenails was carried out at today's visit via Q-switch YAG laser by QClear Laser at continuous on the 10 digit toenails.  Patient and staff were wearing appropriate laser protective goggles/eyewear Laser device was tested prior to use and safety protocols were followed Frequency of 5 Hz, Level 4, Joules 1.3 delivered. The patient tolerated the lasering well without any complications. They were encouraged to call the office with any questions, concerns, change in symptoms. -Follow-up 3 month for repeat laser or sooner if any problems arise. In the meantime, encouraged to  Ovid Curd, DPM

## 2015-09-15 MED FILL — CARVEDILOL 25 MG TABLET: 25 | 30 days supply | Qty: 60 | Fill #1

## 2015-09-19 MED FILL — DOFETILIDE 250 MCG CAPSULE: 250 | 30 days supply | Qty: 60 | Fill #9

## 2015-09-22 ENCOUNTER — Ambulatory Visit (INDEPENDENT_AMBULATORY_CARE_PROVIDER_SITE_OTHER): Payer: Medicare Other | Admitting: *Deleted

## 2015-09-22 DIAGNOSIS — I428 Other cardiomyopathies: Secondary | ICD-10-CM

## 2015-09-22 DIAGNOSIS — I429 Cardiomyopathy, unspecified: Secondary | ICD-10-CM | POA: Diagnosis not present

## 2015-09-22 NOTE — Progress Notes (Signed)
Remote ICD transmission.   

## 2015-09-23 LAB — CUP PACEART REMOTE DEVICE CHECK
Battery Remaining Percentage: 100 %
Brady Statistic RA Percent Paced: 91 %
HIGH POWER IMPEDANCE MEASURED VALUE: 54 Ohm
Implantable Lead Implant Date: 20111201
Implantable Lead Implant Date: 20111201
Implantable Lead Location: 753858
Implantable Lead Model: 4396
Lead Channel Impedance Value: 676 Ohm
Lead Channel Impedance Value: 774 Ohm
Lead Channel Pacing Threshold Pulse Width: 0.4 ms
Lead Channel Pacing Threshold Pulse Width: 1.5 ms
Lead Channel Setting Pacing Amplitude: 2 V
Lead Channel Setting Pacing Amplitude: 2.4 V
Lead Channel Setting Pacing Pulse Width: 0.4 ms
MDC IDC LEAD IMPLANT DT: 20111201
MDC IDC LEAD LOCATION: 753859
MDC IDC LEAD LOCATION: 753860
MDC IDC LEAD MODEL: 158
MDC IDC LEAD SERIAL: 302703
MDC IDC MSMT BATTERY REMAINING LONGEVITY: 96 mo
MDC IDC MSMT LEADCHNL LV IMPEDANCE VALUE: 1055 Ohm
MDC IDC MSMT LEADCHNL LV PACING THRESHOLD AMPLITUDE: 1.7 V
MDC IDC MSMT LEADCHNL RA PACING THRESHOLD AMPLITUDE: 0.9 V
MDC IDC MSMT LEADCHNL RV PACING THRESHOLD AMPLITUDE: 0.8 V
MDC IDC MSMT LEADCHNL RV PACING THRESHOLD PULSEWIDTH: 0.4 ms
MDC IDC PG SERIAL: 113381
MDC IDC SESS DTM: 20170619081100
MDC IDC SET LEADCHNL LV PACING AMPLITUDE: 2.2 V
MDC IDC SET LEADCHNL LV PACING PULSEWIDTH: 1.5 ms
MDC IDC SET LEADCHNL LV SENSING SENSITIVITY: 1 mV
MDC IDC SET LEADCHNL RV SENSING SENSITIVITY: 0.5 mV
MDC IDC STAT BRADY RV PERCENT PACED: 89 %

## 2015-09-26 ENCOUNTER — Encounter: Payer: Self-pay | Admitting: Cardiology

## 2015-10-06 MED FILL — INVOKANA 300 MG TABLET: 300 | 30 days supply | Qty: 30 | Fill #6

## 2015-10-06 MED FILL — SPIRONOLACTONE 25 MG TABLET: 25 | 30 days supply | Qty: 30 | Fill #3

## 2015-10-10 ENCOUNTER — Ambulatory Visit (INDEPENDENT_AMBULATORY_CARE_PROVIDER_SITE_OTHER): Payer: Medicare Other | Admitting: *Deleted

## 2015-10-10 DIAGNOSIS — Z5181 Encounter for therapeutic drug level monitoring: Secondary | ICD-10-CM | POA: Diagnosis not present

## 2015-10-10 DIAGNOSIS — I4891 Unspecified atrial fibrillation: Secondary | ICD-10-CM

## 2015-10-10 LAB — POCT INR: INR: 1.9

## 2015-10-13 MED FILL — WARFARIN SODIUM 5 MG TABLET: 5 | 27 days supply | Qty: 40 | Fill #3

## 2015-10-13 MED FILL — ONETOUCH VERIO TEST STRIP: 90 days supply | Qty: 100 | Fill #2

## 2015-10-20 MED FILL — metFORMIN HCL 1000 MG TABS: 1000 | 30 days supply | Qty: 60 | Fill #4

## 2015-11-03 MED FILL — SPIRONOLACTONE 25 MG TABLET: 25 | 30 days supply | Qty: 30 | Fill #4

## 2015-11-03 MED FILL — INVOKANA 300 MG TABLET: 300 | 30 days supply | Qty: 30 | Fill #7

## 2015-11-07 ENCOUNTER — Other Ambulatory Visit: Payer: Self-pay | Admitting: Cardiovascular Disease

## 2015-11-10 ENCOUNTER — Ambulatory Visit (INDEPENDENT_AMBULATORY_CARE_PROVIDER_SITE_OTHER): Payer: Medicare Other | Admitting: Pharmacist

## 2015-11-10 ENCOUNTER — Encounter (INDEPENDENT_AMBULATORY_CARE_PROVIDER_SITE_OTHER): Payer: Self-pay

## 2015-11-10 DIAGNOSIS — I4891 Unspecified atrial fibrillation: Secondary | ICD-10-CM | POA: Diagnosis not present

## 2015-11-10 DIAGNOSIS — Z5181 Encounter for therapeutic drug level monitoring: Secondary | ICD-10-CM | POA: Diagnosis not present

## 2015-11-10 LAB — POCT INR: INR: 2.3

## 2015-11-10 MED FILL — AMOXICILLIN 500 MG CAPSULE: 500 | 1 days supply | Qty: 4 | Fill #0

## 2015-11-10 MED FILL — CARVEDILOL 25 MG TABLET: 25 | 30 days supply | Qty: 60 | Fill #2

## 2015-11-25 ENCOUNTER — Other Ambulatory Visit: Payer: Self-pay | Admitting: Cardiology

## 2015-11-25 ENCOUNTER — Other Ambulatory Visit: Payer: Self-pay | Admitting: Internal Medicine

## 2015-11-25 MED FILL — LISINOPRIL 40 MG TABLET: 40 | 90 days supply | Qty: 90 | Fill #0

## 2015-11-25 MED FILL — WARFARIN SODIUM 5 MG TABLET: 5 | 26 days supply | Qty: 40 | Fill #0

## 2015-11-26 MED FILL — DOFETILIDE 250 MCG CAPSULE: 250 | 30 days supply | Qty: 60 | Fill #0

## 2015-12-01 MED FILL — INVOKANA 300 MG TABLET: 300 | 30 days supply | Qty: 30 | Fill #8

## 2015-12-09 MED FILL — SPIRONOLACTONE 25 MG TABLET: 25 | 30 days supply | Qty: 30 | Fill #5

## 2015-12-15 MED FILL — CARVEDILOL 25 MG TABLET: 25 | 30 days supply | Qty: 60 | Fill #3

## 2015-12-15 MED FILL — metFORMIN HCL 1000 MG TABS: 1000 | 30 days supply | Qty: 60 | Fill #0

## 2015-12-22 ENCOUNTER — Ambulatory Visit (INDEPENDENT_AMBULATORY_CARE_PROVIDER_SITE_OTHER): Payer: Medicare Other | Admitting: *Deleted

## 2015-12-22 DIAGNOSIS — I429 Cardiomyopathy, unspecified: Secondary | ICD-10-CM | POA: Diagnosis not present

## 2015-12-22 DIAGNOSIS — I428 Other cardiomyopathies: Secondary | ICD-10-CM

## 2015-12-22 DIAGNOSIS — Z5181 Encounter for therapeutic drug level monitoring: Secondary | ICD-10-CM | POA: Diagnosis not present

## 2015-12-22 DIAGNOSIS — I4891 Unspecified atrial fibrillation: Secondary | ICD-10-CM

## 2015-12-22 LAB — POCT INR: INR: 2.2

## 2015-12-22 NOTE — Progress Notes (Signed)
Remote ICD transmission.   

## 2015-12-24 ENCOUNTER — Encounter: Payer: Self-pay | Admitting: Cardiology

## 2015-12-29 MED FILL — WARFARIN SODIUM 5 MG TABLET: 5 | 26 days supply | Qty: 40 | Fill #1

## 2015-12-29 MED FILL — INVOKANA 300 MG TABLET: 300 | 30 days supply | Qty: 30 | Fill #9

## 2016-01-02 NOTE — Telephone Encounter (Signed)
Looks like this is patient of Dr Graciela Husbands.

## 2016-01-02 NOTE — Telephone Encounter (Signed)
OK to refill- he has a history of aortic valve replacement.

## 2016-01-05 NOTE — Telephone Encounter (Deleted)
Please advise 

## 2016-01-05 NOTE — Telephone Encounter (Signed)
Pt's Rx sent to pt's pharmacy as requested. Confirmation received.  °

## 2016-01-06 MED FILL — SPIRONOLACTONE 25 MG TABLET: 25 | 30 days supply | Qty: 30 | Fill #6

## 2016-01-09 LAB — CUP PACEART REMOTE DEVICE CHECK
Brady Statistic RA Percent Paced: 93 %
Brady Statistic RV Percent Paced: 91 %
Date Time Interrogation Session: 20170916205900
HIGH POWER IMPEDANCE MEASURED VALUE: 52 Ohm
Implantable Lead Location: 753858
Implantable Lead Model: 4396
Implantable Lead Model: 5076
Lead Channel Impedance Value: 628 Ohm
Lead Channel Pacing Threshold Amplitude: 0.8 V
Lead Channel Pacing Threshold Pulse Width: 0.4 ms
Lead Channel Pacing Threshold Pulse Width: 1.5 ms
Lead Channel Setting Pacing Amplitude: 2 V
Lead Channel Setting Pacing Amplitude: 2.4 V
Lead Channel Setting Pacing Pulse Width: 0.4 ms
Lead Channel Setting Sensing Sensitivity: 1 mV
MDC IDC LEAD IMPLANT DT: 20111201
MDC IDC LEAD IMPLANT DT: 20111201
MDC IDC LEAD IMPLANT DT: 20111201
MDC IDC LEAD LOCATION: 753859
MDC IDC LEAD LOCATION: 753860
MDC IDC LEAD MODEL: 158
MDC IDC LEAD SERIAL: 302703
MDC IDC MSMT BATTERY REMAINING LONGEVITY: 96 mo
MDC IDC MSMT BATTERY REMAINING PERCENTAGE: 100 %
MDC IDC MSMT LEADCHNL LV IMPEDANCE VALUE: 1058 Ohm
MDC IDC MSMT LEADCHNL LV PACING THRESHOLD AMPLITUDE: 1.7 V
MDC IDC MSMT LEADCHNL RA PACING THRESHOLD AMPLITUDE: 0.9 V
MDC IDC MSMT LEADCHNL RV PACING THRESHOLD PULSEWIDTH: 0.4 ms
MDC IDC PG SERIAL: 113381
MDC IDC SET LEADCHNL LV PACING AMPLITUDE: 2.2 V
MDC IDC SET LEADCHNL LV PACING PULSEWIDTH: 1.5 ms
MDC IDC SET LEADCHNL RV SENSING SENSITIVITY: 0.5 mV

## 2016-01-19 ENCOUNTER — Ambulatory Visit (INDEPENDENT_AMBULATORY_CARE_PROVIDER_SITE_OTHER): Payer: Medicare Other | Admitting: *Deleted

## 2016-01-19 DIAGNOSIS — Z5181 Encounter for therapeutic drug level monitoring: Secondary | ICD-10-CM | POA: Diagnosis not present

## 2016-01-19 DIAGNOSIS — I4891 Unspecified atrial fibrillation: Secondary | ICD-10-CM

## 2016-01-19 LAB — POCT INR: INR: 2.1

## 2016-01-28 MED FILL — CARVEDILOL 25 MG TABLET: 25 | 30 days supply | Qty: 60 | Fill #4

## 2016-01-28 MED FILL — INVOKANA 300 MG TABLET: 300 | 30 days supply | Qty: 30 | Fill #10

## 2016-01-28 MED FILL — FUROSEMIDE 40 MG TABLET: 40 | 30 days supply | Qty: 45 | Fill #1

## 2016-01-28 MED FILL — DOFETILIDE 250 MCG CAPSULE: 250 | 30 days supply | Qty: 60 | Fill #1

## 2016-02-03 ENCOUNTER — Telehealth: Payer: Self-pay | Admitting: *Deleted

## 2016-02-03 NOTE — Telephone Encounter (Signed)
Attempted call on home # x1--N/A LMTCB (cell phone)/sss  Patient needs to f/u with AS d/t low Bp per SK

## 2016-02-04 MED FILL — SPIRONOLACTONE 25 MG TABLET: 25 | 30 days supply | Qty: 30 | Fill #7

## 2016-02-04 MED FILL — WARFARIN SODIUM 5 MG TABLET: 5 | 26 days supply | Qty: 40 | Fill #2

## 2016-02-18 NOTE — Telephone Encounter (Signed)
Informed patient that Dr.Klein wanted him to follow up with Gypsy Balsam, NP to increase his BiV pacing. Patient voiced understanding.

## 2016-02-18 NOTE — Telephone Encounter (Signed)
Pt was reached appt made with Amber 02-23-16 at 1020am, pt would like to know what the appt is for-pls call [phone]

## 2016-02-20 ENCOUNTER — Encounter: Payer: Self-pay | Admitting: Nurse Practitioner

## 2016-02-22 NOTE — Progress Notes (Addendum)
Electrophysiology Office Note Date: 02/23/2016  ID:  Redell, Fryar January 11, 1941, MRN [id number]  PCP: Gaspar Garbe, MD  Cardiologist: Jens Som Electrophysiologist: Graciela Husbands  CC: Routine ICD follow-up  [name] is a 75 y.o. male seen today for Dr Graciela Husbands.  He presents today for routine electrophysiology followup.  Since last being seen in our clinic, the patient reports doing reasonably well.  He remains active working outdoors.  He sometimes "gives out" with exertion but recovers quickly when he rests.  He denies chest pain, palpitations, dyspnea, PND, orthopnea, nausea, vomiting, dizziness, syncope, edema, weight gain, or early satiety.  He has not had ICD shocks.   Device History: BSX CRTD implanted 2011 for NICM; gen change 2016 History of appropriate therapy: No History of AAD therapy: Yes - Tikosyn for AF   Past Medical History:  Diagnosis Date  . Aortic stenosis/ bicuspid valve--s/p AVR   . Ascending aortic aneurysm (HCC)   . Atrial fibrillation/flutter   . Biventricular implantable cardiac defibrillator- BSX   . Cholelithiases   . Diabetes mellitus without complication (HCC)   . DJD (degenerative joint disease)   . Hypertension   . LBBB (left bundle branch block)   . Non-ischemic cardiomyopathy (HCC)   . OSA (obstructive sleep apnea)    Past Surgical History:  Procedure Laterality Date  . AORTIC VALVE REPLACEMENT    . APPENDECTOMY    . CERVICAL DISCECTOMY  5/01   C7  . CHOLECYSTECTOMY    . EP IMPLANTABLE DEVICE N/A 11/20/2014   Procedure:  ICD Generator Changeout;  Surgeon: Duke Salvia, MD;  Location: [facility] INVASIVE CV LAB;  Service: Cardiovascular;  Laterality: N/A;  . MEDIAN STERNOTOMY    . TONSILLECTOMY      Current Outpatient Prescriptions  Medication Sig Dispense Refill  . amoxicillin (AMOXIL) 500 MG capsule TAKE 4 CAPSULES (2,000 MG TOTAL) BY MOUTH ONCE AS NEEDED (FOR DENTAL PROCEDURES). 4 capsule 3  . carvedilol (COREG) 25 MG tablet TAKE 1  TABLET BY MOUTH TWICE DAILY WITH A MEAL 60 tablet 6  . dofetilide (TIKOSYN) 250 MCG capsule TAKE 1 CAPSULE BY MOUTH 2 TIMES DAILY. 60 capsule 6  . furosemide (LASIX) 40 MG tablet Take 40 mg by mouth daily.    . INVOKANA 300 MG TABS tablet Take 300 mg by mouth daily.  12  . lisinopril (PRINIVIL,ZESTRIL) 40 MG tablet Take 40 mg by mouth daily.    . metFORMIN (GLUCOPHAGE) 1000 MG tablet Take 1,000 mg by mouth 2 (two) times daily.   4  . spironolactone (ALDACTONE) 25 MG tablet TAKE 1 TABLET (25 MG) BY MOUTH DAILY. 30 tablet 7  . warfarin (COUMADIN) 5 MG tablet Take 1-1.5 tablets (5-7.5 mg total) by mouth daily. 40 tablet 3  . warfarin (COUMADIN) 5 MG tablet Take 1 to 1.5 tablets by mouth daily as directed by coumadin clinic 40 tablet 4   No current facility-administered medications for this visit.     Allergies:   Patient has no known allergies.   Social History: Social History   Social History  . Marital status: Married    Spouse name: N/A  . Number of children: 2  . Years of education: N/A   Occupational History  . retired Art gallery manager    Social History Main Topics  . Smoking status: Former Games developer  . Smokeless tobacco: Never Used  . Alcohol use No  . Drug use: No  . Sexual activity: Not on file   Other Topics Concern  .  Not on file   Social History Narrative  . No narrative on file    Family History: Family History  Problem Relation Age of Onset  . Emphysema Father   . Leukemia Mother   . Hypertension Maternal Grandmother   . Stroke Maternal Grandmother   . Congestive Heart Failure Sister   . Diabetes Brother   . Alzheimer's disease Brother     Review of Systems: All other systems reviewed and are otherwise negative except as noted above.   Physical Exam: VS:  BP 120/80   Pulse 76   Ht 5\' 10"  (1.778 m)   Wt 226 lb 1.9 oz (102.6 kg)   SpO2 96%   BMI 32.44 kg/m  , BMI Body mass index is 32.44 kg/m.  GEN- The patient is obese appearing, alert and oriented x 3  today.   HEENT: normocephalic, atraumatic; sclera clear, conjunctiva pink; hearing intact; oropharynx clear; neck supple Lungs- Clear to ausculation bilaterally, normal work of breathing.  No wheezes, rales, rhonchi Heart- Regular rate and rhythm (paced) GI- soft, non-tender, non-distended, bowel sounds present Extremities- no clubbing, cyanosis, +depedent BLE edema MS- no significant deformity or atrophy Skin- warm and dry, no rash or lesion; ICD pocket well healed Psych- euthymic mood, full affect Neuro- strength and sensation are intact  ICD interrogation- reviewed in detail today,  See PACEART report  EKG:  EKG is ordered today. The ekg ordered today shows sinus rhythm with ventricular pacing, QTc 536msec, QRS 178msec  Recent Labs: 06/23/2015: Magnesium 2.3 07/18/2015: BUN 27; Creat 1.30; Potassium 4.5; Sodium 137   Wt Readings from Last 3 Encounters:  02/23/16 226 lb 1.9 oz (102.6 kg)  06/23/15 226 lb 6.4 oz (102.7 kg)  03/12/15 222 lb 6.4 oz (100.9 kg)     Other studies Reviewed: Additional studies/ records that were reviewed today include: Dr Jens Somrenshaw and Dr Odessa FlemingKlein's office notes  Assessment and Plan:  1.  Chronic systolic dysfunction euvolemic today EF normalized post CRT Stable on an appropriate medical regimen Normal ICD function CRT pacing chronically 65-70%, will see if increases with rate response on, as higher heart rates in the past have improved CRT response. With normalized EF and chronically low CRT pacing, I have not made other changes today. AV delays already programmed very short.  See Pace Art report No changes today  2.  Persistent atrial fibrillation Burden by device interrogation 0% Continue Tikosyn. BMET, Mg today. QTc stable Continue Warfarin for CHADS2VASC of 5  3.  HTN Stable No change required today  4.  Aortic stenosis s/p AVR Echo 03/2015 stable Followed by Dr Jens Somrenshaw  5.  Sinus node dysfunction RA pacing 92% of time with flat  histograms Rate response turned on today   Current medicines are reviewed at length with the patient today.   The patient does not have concerns regarding his medicines.  The following changes were made today:  none  Labs/ tests ordered today include: BMET, Mg Orders Placed This Encounter  Procedures  . Basic metabolic panel  . Magnesium  . EKG 12-Lead     Disposition:   Follow up with Dr Jens Somrenshaw as scheduled, [place], Dr Graciela HusbandsKlein 1 year    Signed, Gypsy BalsamAmber Argenis Kumari, NP 02/23/2016 10:33 AM  [facility] [address] [place] [address] [phone] (office) [phone] (fax)

## 2016-02-23 ENCOUNTER — Ambulatory Visit (INDEPENDENT_AMBULATORY_CARE_PROVIDER_SITE_OTHER): Payer: Medicare Other | Admitting: Nurse Practitioner

## 2016-02-23 ENCOUNTER — Encounter: Payer: Self-pay | Admitting: Nurse Practitioner

## 2016-02-23 VITALS — BP 120/80 | HR 76 | Ht 70.0 in | Wt 226.1 lb

## 2016-02-23 DIAGNOSIS — Z952 Presence of prosthetic heart valve: Secondary | ICD-10-CM

## 2016-02-23 DIAGNOSIS — I495 Sick sinus syndrome: Secondary | ICD-10-CM

## 2016-02-23 DIAGNOSIS — I10 Essential (primary) hypertension: Secondary | ICD-10-CM

## 2016-02-23 DIAGNOSIS — I5022 Chronic systolic (congestive) heart failure: Secondary | ICD-10-CM | POA: Diagnosis not present

## 2016-02-23 DIAGNOSIS — I4819 Other persistent atrial fibrillation: Secondary | ICD-10-CM

## 2016-02-23 DIAGNOSIS — I481 Persistent atrial fibrillation: Secondary | ICD-10-CM | POA: Diagnosis not present

## 2016-02-23 LAB — BASIC METABOLIC PANEL
BUN: 22 mg/dL (ref 7–25)
CALCIUM: 8.7 mg/dL (ref 8.6–10.3)
CHLORIDE: 105 mmol/L (ref 98–110)
CO2: 25 mmol/L (ref 20–31)
CREATININE: 1.25 mg/dL — AB (ref 0.70–1.18)
Glucose, Bld: 139 mg/dL — ABNORMAL HIGH (ref 65–99)
Potassium: 4.2 mmol/L (ref 3.5–5.3)
Sodium: 138 mmol/L (ref 135–146)

## 2016-02-23 LAB — CUP PACEART INCLINIC DEVICE CHECK
Date Time Interrogation Session: 20171120121912
Implantable Lead Implant Date: 20111201
Implantable Lead Implant Date: 20111201
Implantable Lead Location: 753858
Implantable Lead Location: 753860
Implantable Lead Model: 158
Implantable Lead Model: 5076
Implantable Lead Serial Number: 302703
Implantable Pulse Generator Implant Date: 20160817
MDC IDC LEAD IMPLANT DT: 20111201
MDC IDC LEAD LOCATION: 753859
MDC IDC LEAD MODEL: 4396
Pulse Gen Serial Number: 113381

## 2016-02-23 LAB — MAGNESIUM: Magnesium: 2.2 mg/dL (ref 1.5–2.5)

## 2016-02-23 NOTE — Patient Instructions (Addendum)
Medication Instructions:   Your physician recommends that you continue on your current medications as directed. Please refer to the Current Medication list given to you today.    If you need a refill on your cardiac medications before your next appointment, please call your pharmacy.  Labwork: BMET AND MAG    Testing/Procedures: NONE ORDERED  TODAY    Follow-Up: Your physician wants you to follow-up in: ONE YEAR WITH Graciela Husbands  You will receive a reminder letter in the mail two months in advance. If you don't receive a letter, please call our office to schedule the follow-up appointment.  Remote monitoring is used to monitor your Pacemaker of ICD from home. This monitoring reduces the number of office visits required to check your device to one time per year. It allows [place] to keep an eye on the functioning of your device to ensure it is working properly. You are scheduled for a device check from home on . 05/24/16..You may send your transmission at any time that day. If you have a wireless device, the transmission will be sent automatically. After your physician reviews your transmission, you will receive a postcard with your next transmission date.     Any Other Special Instructions Will Be Listed Below (If Applicable).

## 2016-02-27 MED FILL — ONE TOUCH VERIO TEST STRIP: 50 days supply | Qty: 50 | Fill #0

## 2016-02-27 MED FILL — INVOKANA 300 MG TABLET: 300 | 30 days supply | Qty: 30 | Fill #11

## 2016-02-27 MED FILL — LISINOPRIL 40 MG TABLET: 40 | 90 days supply | Qty: 90 | Fill #1

## 2016-03-01 ENCOUNTER — Ambulatory Visit (INDEPENDENT_AMBULATORY_CARE_PROVIDER_SITE_OTHER): Payer: Medicare Other | Admitting: *Deleted

## 2016-03-01 DIAGNOSIS — Z5181 Encounter for therapeutic drug level monitoring: Secondary | ICD-10-CM

## 2016-03-01 DIAGNOSIS — I4891 Unspecified atrial fibrillation: Secondary | ICD-10-CM | POA: Diagnosis not present

## 2016-03-01 LAB — POCT INR: INR: 2.4

## 2016-03-05 ENCOUNTER — Other Ambulatory Visit: Payer: Self-pay | Admitting: Cardiology

## 2016-03-05 MED FILL — SPIRONOLACTONE 25 MG TABLET: 25 | 30 days supply | Qty: 30 | Fill #0

## 2016-03-08 MED FILL — INVOKANA 300 MG TABLET: 300 | 30 days supply | Qty: 30 | Fill #0

## 2016-03-24 ENCOUNTER — Encounter: Payer: Medicare Other | Admitting: Internal Medicine

## 2016-03-30 MED FILL — CARVEDILOL 25 MG TABLET: 25 | 30 days supply | Qty: 60 | Fill #5

## 2016-03-30 MED FILL — DOFETILIDE 250 MCG CAPSULE: 250 | 30 days supply | Qty: 60 | Fill #2

## 2016-03-30 MED FILL — WARFARIN SODIUM 5 MG TABLET: 5 | 26 days supply | Qty: 40 | Fill #3

## 2016-03-31 MED FILL — INVOKANA 300 MG TABLET: 300 | 30 days supply | Qty: 30 | Fill #1

## 2016-04-05 MED FILL — SPIRONOLACTONE 25 MG TABLET: 25 | 30 days supply | Qty: 30 | Fill #1

## 2016-04-12 ENCOUNTER — Ambulatory Visit (INDEPENDENT_AMBULATORY_CARE_PROVIDER_SITE_OTHER): Payer: Medicare Other | Admitting: *Deleted

## 2016-04-12 ENCOUNTER — Telehealth: Payer: Self-pay | Admitting: Internal Medicine

## 2016-04-12 DIAGNOSIS — Z5181 Encounter for therapeutic drug level monitoring: Secondary | ICD-10-CM

## 2016-04-12 DIAGNOSIS — I4891 Unspecified atrial fibrillation: Secondary | ICD-10-CM

## 2016-04-12 NOTE — Telephone Encounter (Signed)
If he refuses coumadin and realizes risk of CVA, would treat with ASA. Olga Millers

## 2016-04-12 NOTE — Telephone Encounter (Signed)
[name] is requesting someone please call, Thanks.

## 2016-04-12 NOTE — Telephone Encounter (Signed)
I called and spoke with the patient.  He states he saw the coumadin clinic today, but had stopped his warfarin as of last week (1/3) due to bruises that starting bleeding while just sitting.  He denies any other symptoms of bleeding.  No recent CBC on file, but he states she saw Dr. Wylene Simmer recently and labs were drawn that were reported to him as "ok."  Per CVRR today:  Comments:  Pt states his bruises on upper extremities has worsen over the past months and thus he stopped his Coumadin on last Wednesday, April 07, 2016. Risk explained but pt states he has lived longer than his father and he's ok with his accepting any risk. Since discontinuing Coumadin he has started 325mg  ASA. He has not reported this to Dr Jens Som but I encourged he to call to do so. He says he will but still isn't going to take Coumadin anymore    The patient advises me he will not take warfarin anymore.  Hx- afib/ aortic stenosis w/ bicuspid AVR  Will forward to Dr. Graciela Husbands and Dr. Jens Som for further review and recommendations.

## 2016-04-13 MED ORDER — ASPIRIN EC 325 MG PO TBEC: 325.0000 mg | DELAYED_RELEASE_TABLET | Freq: Every day | ORAL | 0 refills | Status: DC

## 2016-04-13 MED FILL — DOXYCYCLINE HYCLATE 100 MG: 100 | 14 days supply | Qty: 14 | Fill #0

## 2016-04-13 MED FILL — MUPIROCIN 2% OINTMENT: 2 | 15 days supply | Qty: 22 | Fill #0

## 2016-04-13 NOTE — Telephone Encounter (Signed)
Spoke with pt, he voiced he understands the risk of stroke. He will continue aspirin 325 mg once daily. He was referred to urgent care regarding a possible infection in his hand from skin abrasion.

## 2016-04-27 MED FILL — INVOKANA 300 MG TABLET: 300 | 30 days supply | Qty: 30 | Fill #2

## 2016-05-03 ENCOUNTER — Ambulatory Visit (INDEPENDENT_AMBULATORY_CARE_PROVIDER_SITE_OTHER): Payer: Medicare Other | Admitting: Internal Medicine

## 2016-05-03 ENCOUNTER — Encounter: Payer: Self-pay | Admitting: Internal Medicine

## 2016-05-03 VITALS — BP 110/72 | HR 65 | Ht 70.0 in | Wt 219.6 lb

## 2016-05-03 DIAGNOSIS — I428 Other cardiomyopathies: Secondary | ICD-10-CM | POA: Diagnosis not present

## 2016-05-03 DIAGNOSIS — Z95810 Presence of automatic (implantable) cardiac defibrillator: Secondary | ICD-10-CM

## 2016-05-03 DIAGNOSIS — I480 Paroxysmal atrial fibrillation: Secondary | ICD-10-CM | POA: Diagnosis not present

## 2016-05-03 DIAGNOSIS — I493 Ventricular premature depolarization: Secondary | ICD-10-CM

## 2016-05-03 DIAGNOSIS — Z952 Presence of prosthetic heart valve: Secondary | ICD-10-CM

## 2016-05-03 DIAGNOSIS — I5022 Chronic systolic (congestive) heart failure: Secondary | ICD-10-CM | POA: Diagnosis not present

## 2016-05-03 MED ORDER — ASPIRIN EC 81 MG PO TBEC: 162.0000 mg | DELAYED_RELEASE_TABLET | Freq: Every day | ORAL | Status: DC

## 2016-05-03 NOTE — Patient Instructions (Signed)
Medication Instructions: - Your physician has recommended you make the following change in your medication:  1) Decrease aspirin to 81 mg- take 2 tablets (162 mg) by mouth once daily  Labwork: - none ordered  Procedures/Testing: - none ordered  Follow-Up: - Remote monitoring is used to monitor your Pacemaker of ICD from home. This monitoring reduces the number of office visits required to check your device to one time per year. It allows [place] to keep an eye on the functioning of your device to ensure it is working properly. You are scheduled for a device check from home on 08/02/16. You may send your transmission at any time that day. If you have a wireless device, the transmission will be sent automatically. After your physician reviews your transmission, you will receive a postcard with your next transmission date.  - Your physician wants you to follow-up in: July 2018 with Gypsy Balsam, NP for Dr. Graciela Husbands. You will receive a reminder letter in the mail two months in advance. If you don't receive a letter, please call our office to schedule the follow-up appointment.  Any Additional Special Instructions Will Be Listed Below (If Applicable).     If you need a refill on your cardiac medications before your next appointment, please call your pharmacy.

## 2016-05-03 NOTE — Progress Notes (Signed)
Patient Care Team: Gaspar Garbe, MD as PCP - General   HPI  [name] is a 76 y.o. male Seen in followup for CRT-D implantation in the fall 2011. He underwent device replacement 8/16 He also had a history of atrial flutter.   In 2009 he underwent  porcine aortic valve replacement with 25 mm Carpentier-Edwards valve, replacement of ascending aortic aneurysm with 38-mm Hemishield graft, as well as Cox maze procedure for atrial fibrillation.   He takes dofetilide.  TERF include age, CAD DM CHF for CHADSVASc> 5; previously he was on warfarin but this is been intercurrently discontinued. From notes were reviewed and he is now taking aspirin? 325 mg?   Cardiac catheterization in April of 2009>> s ejection fraction of 20%, no coronary disease,  Echocardiogram performed  814 demonstrated interval improvement 30-35---50%.  He sleeps in a chair. He has not had any problems with shortness of breath or chest discomfort.        Past Medical History:  Diagnosis Date  . Aortic stenosis/ bicuspid valve--s/p AVR   . Ascending aortic aneurysm (HCC)   . Atrial fibrillation/flutter   . Biventricular implantable cardiac defibrillator- BSX   . Cholelithiases   . Diabetes mellitus without complication (HCC)   . DJD (degenerative joint disease)   . Hypertension   . LBBB (left bundle branch block)   . Non-ischemic cardiomyopathy (HCC)   . OSA (obstructive sleep apnea)     Past Surgical History:  Procedure Laterality Date  . AORTIC VALVE REPLACEMENT    . APPENDECTOMY    . CERVICAL DISCECTOMY  5/01   C7  . CHOLECYSTECTOMY    . EP IMPLANTABLE DEVICE N/A 11/20/2014   Procedure:  ICD Generator Changeout;  Surgeon: Duke Salvia, MD;  Location: [facility] INVASIVE CV LAB;  Service: Cardiovascular;  Laterality: N/A;  . MEDIAN STERNOTOMY    . TONSILLECTOMY      Current Outpatient Prescriptions  Medication Sig Dispense Refill  . amoxicillin (AMOXIL) 500 MG capsule TAKE 4 CAPSULES  (2,000 MG TOTAL) BY MOUTH ONCE AS NEEDED (FOR DENTAL PROCEDURES). 4 capsule 3  . aspirin EC 325 MG tablet Take 1 tablet (325 mg total) by mouth daily. 30 tablet 0  . carvedilol (COREG) 25 MG tablet TAKE 1 TABLET BY MOUTH TWICE DAILY WITH A MEAL 60 tablet 6  . dofetilide (TIKOSYN) 250 MCG capsule TAKE 1 CAPSULE BY MOUTH 2 TIMES DAILY. 60 capsule 6  . furosemide (LASIX) 40 MG tablet Take 40 mg by mouth daily.    . INVOKANA 300 MG TABS tablet Take 300 mg by mouth daily.  12  . lisinopril (PRINIVIL,ZESTRIL) 40 MG tablet Take 40 mg by mouth daily.    . metFORMIN (GLUCOPHAGE) 1000 MG tablet Take 1,000 mg by mouth 2 (two) times daily.   4  . spironolactone (ALDACTONE) 25 MG tablet TAKE 1 TABLET (25 MG) BY MOUTH DAILY. 30 tablet 11   No current facility-administered medications for this visit.     No Known Allergies  Review of Systems negative except from HPI and PMH  Physical Exam There were no vitals taken for this visit. Well developed and well nourished in no acute distress HENT normal E scleral and icterus clear Neck Supple JVP 7-8  carotids brisk and full Clear to ausculation The patient's device was interrogated.  The information was reviewed. No changes were made in the programming.    Regular rate and rhythm, 2/6 systolic murmur diminished S1 and  present S4  Loud s2 Soft with active bowel sounds No clubbing cyanosis   Tr Edema Alert and oriented, grossly normal motor and sensory function Skin Warm and Dry    ECG demonstrates AV pacing with a configuration consistent with biventricular pacing upright QRS in V1 down QRS lead    Assessment and  Plan  Nonischemic cardiomyopathy  Aortic stenosis S./P. bioprosthetic replacement  Implantable defibrillator-CRT-[employer]  The patient's device was interrogated and the information was fully reviewed.  The device was reprogrammed to  shorten the AV delay and to eliminate diaphragmatic stimulation by creating a vector from the LV  tip to RV coil  Congestive heart.-Chronic mixed   PVCs  frequent  Atrial fibrillation-dofetilide  Chronotropic incompetence Euvolemic continue current meds  No interval detected atrial fibrillation now for more than 2 years. Given the issues of anticoagulation which she is now opposed, we'll continue him on his aspirin. We will decrease the dose to 162.  Dofetilide surveillance laboratories were normal in November.  He is chronotropically incompetent but has no associated symptoms. We will not reprogrammed rate response

## 2016-05-04 MED FILL — SPIRONOLACTONE 25 MG TABLET: 25 | 30 days supply | Qty: 30 | Fill #2

## 2016-05-07 LAB — CUP PACEART INCLINIC DEVICE CHECK
Date Time Interrogation Session: 20180129050000
HIGH POWER IMPEDANCE MEASURED VALUE: 54 Ohm
Implantable Lead Location: 753859
Implantable Lead Location: 753860
Implantable Lead Model: 5076
Implantable Lead Serial Number: 302703
Implantable Pulse Generator Implant Date: 20160817
Lead Channel Impedance Value: 550 Ohm
Lead Channel Impedance Value: 744 Ohm
Lead Channel Pacing Threshold Amplitude: 0.8 V
Lead Channel Pacing Threshold Pulse Width: 1.5 ms
Lead Channel Sensing Intrinsic Amplitude: 1.9 mV
Lead Channel Sensing Intrinsic Amplitude: 25 mV
Lead Channel Setting Pacing Amplitude: 2.4 V
Lead Channel Setting Pacing Pulse Width: 1.5 ms
MDC IDC LEAD IMPLANT DT: 20111201
MDC IDC LEAD IMPLANT DT: 20111201
MDC IDC LEAD IMPLANT DT: 20111201
MDC IDC LEAD LOCATION: 753858
MDC IDC MSMT LEADCHNL LV IMPEDANCE VALUE: 1059 Ohm
MDC IDC MSMT LEADCHNL LV PACING THRESHOLD AMPLITUDE: 1.7 V
MDC IDC MSMT LEADCHNL LV SENSING INTR AMPL: 12.1 mV
MDC IDC MSMT LEADCHNL RA PACING THRESHOLD PULSEWIDTH: 0.4 ms
MDC IDC MSMT LEADCHNL RV PACING THRESHOLD AMPLITUDE: 0.9 V
MDC IDC MSMT LEADCHNL RV PACING THRESHOLD PULSEWIDTH: 0.4 ms
MDC IDC SET LEADCHNL LV PACING AMPLITUDE: 2.2 V
MDC IDC SET LEADCHNL LV SENSING SENSITIVITY: 1 mV
MDC IDC SET LEADCHNL RA PACING AMPLITUDE: 2 V
MDC IDC SET LEADCHNL RV PACING PULSEWIDTH: 0.4 ms
MDC IDC SET LEADCHNL RV SENSING SENSITIVITY: 0.5 mV
Pulse Gen Serial Number: 113381

## 2016-05-11 MED FILL — CARVEDILOL 25 MG TABLET: 25 | 30 days supply | Qty: 60 | Fill #6

## 2016-05-17 MED FILL — ONE TOUCH VERIO TEST STRIP: 50 days supply | Qty: 50 | Fill #1

## 2016-05-18 MED FILL — DOFETILIDE 250 MCG CAPSULE: 250 | 30 days supply | Qty: 60 | Fill #3

## 2016-05-25 MED FILL — LISINOPRIL 40 MG TABLET: 40 | 90 days supply | Qty: 90 | Fill #2

## 2016-05-25 MED FILL — INVOKANA 300 MG TABLET: 300 | 30 days supply | Qty: 30 | Fill #3

## 2016-06-02 MED FILL — SPIRONOLACTONE 25 MG TABLET: 25 | 30 days supply | Qty: 30 | Fill #3

## 2016-06-23 MED FILL — INVOKANA 300 MG TABLET: 300 | 30 days supply | Qty: 30 | Fill #4

## 2016-07-01 MED FILL — SPIRONOLACTONE 25 MG TABLET: 25 | 30 days supply | Qty: 30 | Fill #4

## 2016-07-15 ENCOUNTER — Other Ambulatory Visit: Payer: Self-pay | Admitting: Cardiology

## 2016-07-15 MED FILL — CARVEDILOL 25 MG TABLET: 25 | 30 days supply | Qty: 60 | Fill #0

## 2016-07-22 MED FILL — DOFETILIDE 250 MCG CAPSULE: 250 | 30 days supply | Qty: 60 | Fill #4

## 2016-07-22 MED FILL — INVOKANA 300 MG TABLET: 300 | 30 days supply | Qty: 30 | Fill #5

## 2016-07-30 MED FILL — SPIRONOLACTONE 25 MG TABLET: 25 | 30 days supply | Qty: 30 | Fill #5

## 2016-08-02 ENCOUNTER — Ambulatory Visit (INDEPENDENT_AMBULATORY_CARE_PROVIDER_SITE_OTHER): Payer: Medicare Other | Admitting: *Deleted

## 2016-08-02 DIAGNOSIS — I428 Other cardiomyopathies: Secondary | ICD-10-CM

## 2016-08-02 NOTE — Progress Notes (Signed)
Remote ICD transmission.   

## 2016-08-03 ENCOUNTER — Encounter: Payer: Self-pay | Admitting: Cardiology

## 2016-08-03 LAB — CUP PACEART REMOTE DEVICE CHECK
Battery Remaining Longevity: 96 mo
Brady Statistic RV Percent Paced: 96 %
Date Time Interrogation Session: 20180430081200
HighPow Impedance: 50 Ohm
Implantable Lead Implant Date: 20111201
Implantable Lead Location: 753859
Implantable Lead Location: 753860
Implantable Lead Model: 158
Implantable Lead Model: 4396
Implantable Lead Model: 5076
Implantable Lead Serial Number: 302703
Implantable Pulse Generator Implant Date: 20160817
Lead Channel Impedance Value: 1023 Ohm
Lead Channel Pacing Threshold Amplitude: 0.9 V
Lead Channel Pacing Threshold Pulse Width: 0.4 ms
Lead Channel Setting Pacing Amplitude: 2.2 V
Lead Channel Setting Pacing Amplitude: 2.4 V
Lead Channel Setting Pacing Pulse Width: 1.5 ms
Lead Channel Setting Sensing Sensitivity: 0.5 mV
MDC IDC LEAD IMPLANT DT: 20111201
MDC IDC LEAD IMPLANT DT: 20111201
MDC IDC LEAD LOCATION: 753858
MDC IDC MSMT BATTERY REMAINING PERCENTAGE: 100 %
MDC IDC MSMT LEADCHNL LV PACING THRESHOLD AMPLITUDE: 1.7 V
MDC IDC MSMT LEADCHNL LV PACING THRESHOLD PULSEWIDTH: 1.5 ms
MDC IDC MSMT LEADCHNL RA IMPEDANCE VALUE: 533 Ohm
MDC IDC MSMT LEADCHNL RA PACING THRESHOLD AMPLITUDE: 0.8 V
MDC IDC MSMT LEADCHNL RA PACING THRESHOLD PULSEWIDTH: 0.4 ms
MDC IDC MSMT LEADCHNL RV IMPEDANCE VALUE: 721 Ohm
MDC IDC SET LEADCHNL LV SENSING SENSITIVITY: 1 mV
MDC IDC SET LEADCHNL RA PACING AMPLITUDE: 2 V
MDC IDC SET LEADCHNL RV PACING PULSEWIDTH: 0.4 ms
MDC IDC STAT BRADY RA PERCENT PACED: 97 %
Pulse Gen Serial Number: 113381

## 2016-08-12 MED FILL — ONE TOUCH VERIO TEST STRIP: 50 days supply | Qty: 50 | Fill #2

## 2016-08-20 MED FILL — metFORMIN HCL 1000 MG TABS: 1000 | 30 days supply | Qty: 60 | Fill #1

## 2016-08-31 MED FILL — LISINOPRIL 40 MG TABLET: 40 | 90 days supply | Qty: 90 | Fill #3

## 2016-08-31 MED FILL — INVOKANA 300 MG TABLET: 300 | 30 days supply | Qty: 30 | Fill #6

## 2016-08-31 MED FILL — SPIRONOLACTONE 25 MG TABLET: 25 | 30 days supply | Qty: 30 | Fill #6

## 2016-09-14 MED FILL — DOFETILIDE 250 MCG CAPSULE: 250 | 30 days supply | Qty: 60 | Fill #5

## 2016-09-14 MED FILL — CARVEDILOL 25 MG TABLET: 25 | 30 days supply | Qty: 60 | Fill #1

## 2016-09-24 ENCOUNTER — Encounter: Payer: Self-pay | Admitting: Nurse Practitioner

## 2016-09-24 MED FILL — SPIRONOLACTONE 25 MG TABLET: 25 | 30 days supply | Qty: 30 | Fill #7

## 2016-09-24 MED FILL — INVOKANA 300 MG TABLET: 300 | 30 days supply | Qty: 30 | Fill #7

## 2016-10-12 NOTE — Progress Notes (Signed)
Electrophysiology Office Note Date: 10/13/2016  ID:  Tauren, Delbuono 1940/07/05, MRN [id number]  PCP: Gaspar Garbe, MD  Cardiologist: Jens Som Electrophysiologist: Graciela Husbands  CC: Routine ICD follow-up  ACEN CRAUN is a 76 y.o. male seen today for Dr Graciela Husbands.  He presents today for routine electrophysiology followup.  Since last being seen in our clinic, the patient reports doing reasonably well.  He remains active working outdoors.  His Warfarin has been discontinued at his request.  He is also only taking Tikosyn once daily because of cost. He denies chest pain, palpitations, dyspnea, PND, orthopnea, nausea, vomiting, dizziness, syncope, edema, weight gain, or early satiety.  He has not had ICD shocks.   Device History: BSX CRTD implanted 2011 for NICM; gen change 2016 History of appropriate therapy: No History of AAD therapy: Yes - Tikosyn for AF   Past Medical History:  Diagnosis Date  . Aortic stenosis/ bicuspid valve--s/p AVR   . Ascending aortic aneurysm (HCC)   . Atrial fibrillation/flutter   . Biventricular implantable cardiac defibrillator- BSX   . Cholelithiases   . Diabetes mellitus without complication (HCC)   . DJD (degenerative joint disease)   . Hypertension   . LBBB (left bundle branch block)   . Non-ischemic cardiomyopathy (HCC)   . OSA (obstructive sleep apnea)    Past Surgical History:  Procedure Laterality Date  . AORTIC VALVE REPLACEMENT    . APPENDECTOMY    . CERVICAL DISCECTOMY  5/01   C7  . CHOLECYSTECTOMY    . EP IMPLANTABLE DEVICE N/A 11/20/2014   Procedure:  ICD Generator Changeout;  Surgeon: Duke Salvia, MD;  Location: [facility] INVASIVE CV LAB;  Service: Cardiovascular;  Laterality: N/A;  . MEDIAN STERNOTOMY    . TONSILLECTOMY      Current Outpatient Prescriptions  Medication Sig Dispense Refill  . amoxicillin (AMOXIL) 500 MG capsule TAKE 4 CAPSULES (2,000 MG TOTAL) BY MOUTH ONCE AS NEEDED (FOR DENTAL PROCEDURES). 4 capsule 3  .  aspirin EC 81 MG tablet Take 81 mg by mouth daily.    . carvedilol (COREG) 25 MG tablet TAKE 1 TABLET BY MOUTH TWICE DAILY WITH A MEAL 60 tablet 6  . dofetilide (TIKOSYN) 250 MCG capsule Take 250 mcg by mouth daily.    . furosemide (LASIX) 40 MG tablet Take 40 mg by mouth daily as needed (swelling).     . INVOKANA 300 MG TABS tablet Take 300 mg by mouth daily.  12  . lisinopril (PRINIVIL,ZESTRIL) 40 MG tablet Take 40 mg by mouth daily.    . metFORMIN (GLUCOPHAGE) 1000 MG tablet Take 1,000 mg by mouth 2 (two) times daily.   4  . spironolactone (ALDACTONE) 25 MG tablet TAKE 1 TABLET (25 MG) BY MOUTH DAILY. 30 tablet 11   No current facility-administered medications for this visit.     Allergies:   Patient has no known allergies.   Social History: Social History   Social History  . Marital status: Married    Spouse name: N/A  . Number of children: 2  . Years of education: N/A   Occupational History  . retired Art gallery manager    Social History Main Topics  . Smoking status: Former Games developer  . Smokeless tobacco: Never Used  . Alcohol use No  . Drug use: No  . Sexual activity: Not on file   Other Topics Concern  . Not on file   Social History Narrative  . No narrative on file  Family History: Family History  Problem Relation Age of Onset  . Emphysema Father   . Leukemia Mother   . Hypertension Maternal Grandmother   . Stroke Maternal Grandmother   . Congestive Heart Failure Sister   . Diabetes Brother   . Alzheimer's disease Brother     Review of Systems: All other systems reviewed and are otherwise negative except as noted above.   Physical Exam: VS:  BP 102/78   Pulse 75   Ht 5\' 10"  (1.778 m)   Wt 223 lb 3.2 oz (101.2 kg)   SpO2 96%   BMI 32.03 kg/m  , BMI Body mass index is 32.03 kg/m.  GEN- The patient is obese appearing, alert and oriented x 3 today.   HEENT: normocephalic, atraumatic; sclera clear, conjunctiva pink; hearing intact; oropharynx clear; neck  supple Lungs- Clear to ausculation bilaterally, normal work of breathing.  No wheezes, rales, rhonchi Heart- Regular rate and rhythm (paced) GI- soft, non-tender, non-distended, bowel sounds present Extremities- no clubbing, cyanosis, no edema MS- no significant deformity or atrophy Skin- warm and dry, no rash or lesion; ICD pocket well healed Psych- euthymic mood, full affect Neuro- strength and sensation are intact  ICD interrogation- reviewed in detail today,  See PACEART report  EKG:  EKG is ordered today. The ekg ordered today shows AV pacing, QTc (QRS )  Recent Labs: 02/23/2016: BUN 22; Creat 1.25; Magnesium 2.2; Potassium 4.2; Sodium 138   Wt Readings from Last 3 Encounters:  10/13/16 223 lb 3.2 oz (101.2 kg)  05/03/16 219 lb 9.6 oz (99.6 kg)  02/23/16 226 lb 1.9 oz (102.6 kg)     Other studies Reviewed: Additional studies/ records that were reviewed today include: Dr Jens Som and Dr Odessa Fleming office notes  Assessment and Plan:  1.  Chronic systolic dysfunction euvolemic today EF normalized post CRT Stable on an appropriate medical regimen Normal ICD function CRT pacing improved with rate response on See Pace Art report No changes today  2.  Persistent atrial fibrillation Burden by device interrogation <1% - longest episode <5 minutes  Continue Tikosyn. BMET, Mg today. QTc stable CHADS2VASC of 5. Warfarin discontinued as per last office visit with Dr Graciela Husbands.  Currently on ASA.  We discussed today. He is clear in his decision to decline OAC. We also discussed the importance of taking Tikosyn twice daily as prescribed. He is also clear in his decision to only take daily.   3.  HTN Stable No change required today  4.  Aortic stenosis s/p AVR Echo 03/2015 stable Followed by Dr Jens Som    Current medicines are reviewed at length with the patient today.   The patient does not have concerns regarding his medicines.  The following changes were made  today:  none  Labs/ tests ordered today include: BMET, Mg Orders Placed This Encounter  Procedures  . Basic Metabolic Panel (BMET)  . Magnesium  . EKG 12-Lead     Disposition:   Follow up with Dr Jens Som as scheduled, [place], Dr Graciela Husbands 1 year    Signed, Gypsy Balsam, NP 10/13/2016 8:52 AM  [facility] [address] [place] [address] [phone] (office) [phone] (fax)

## 2016-10-13 ENCOUNTER — Encounter: Payer: Self-pay | Admitting: Nurse Practitioner

## 2016-10-13 ENCOUNTER — Ambulatory Visit (INDEPENDENT_AMBULATORY_CARE_PROVIDER_SITE_OTHER): Payer: Medicare Other | Admitting: Nurse Practitioner

## 2016-10-13 ENCOUNTER — Encounter (INDEPENDENT_AMBULATORY_CARE_PROVIDER_SITE_OTHER): Payer: Self-pay

## 2016-10-13 VITALS — BP 102/78 | HR 75 | Ht 70.0 in | Wt 223.2 lb

## 2016-10-13 DIAGNOSIS — Z952 Presence of prosthetic heart valve: Secondary | ICD-10-CM | POA: Diagnosis not present

## 2016-10-13 DIAGNOSIS — I4819 Other persistent atrial fibrillation: Secondary | ICD-10-CM

## 2016-10-13 DIAGNOSIS — I481 Persistent atrial fibrillation: Secondary | ICD-10-CM

## 2016-10-13 DIAGNOSIS — I5022 Chronic systolic (congestive) heart failure: Secondary | ICD-10-CM

## 2016-10-13 DIAGNOSIS — I10 Essential (primary) hypertension: Secondary | ICD-10-CM | POA: Diagnosis not present

## 2016-10-13 LAB — CUP PACEART INCLINIC DEVICE CHECK
Implantable Lead Implant Date: 20111201
Implantable Lead Implant Date: 20111201
Implantable Lead Implant Date: 20111201
Implantable Lead Location: 753859
Implantable Lead Location: 753860
Implantable Lead Model: 4396
Implantable Lead Model: 5076
Implantable Lead Serial Number: 302703
MDC IDC LEAD LOCATION: 753858
MDC IDC PG IMPLANT DT: 20160817
MDC IDC PG SERIAL: 113381
MDC IDC SESS DTM: 20180711085507

## 2016-10-13 LAB — BASIC METABOLIC PANEL
BUN / CREAT RATIO: 23 (ref 10–24)
BUN: 24 mg/dL (ref 8–27)
CHLORIDE: 101 mmol/L (ref 96–106)
CO2: 22 mmol/L (ref 20–29)
CREATININE: 1.06 mg/dL (ref 0.76–1.27)
Calcium: 9.3 mg/dL (ref 8.6–10.2)
GFR calc Af Amer: 78 mL/min/{1.73_m2} (ref >59)
GFR calc non Af Amer: 68 mL/min/{1.73_m2} (ref >59)
GLUCOSE: 225 mg/dL — AB (ref 65–99)
Potassium: 4.6 mmol/L (ref 3.5–5.2)
Sodium: 139 mmol/L (ref 134–144)

## 2016-10-13 LAB — MAGNESIUM: MAGNESIUM: 2.2 mg/dL (ref 1.6–2.3)

## 2016-10-13 NOTE — Patient Instructions (Signed)
Medication Instructions:  Your physician recommends that you continue on your current medications as directed. Please refer to the Current Medication list given to you today.   Labwork: Your physician recommends that you return for lab work today for BMET and MAGNESIUM  Testing/Procedures:   Follow-Up: Your physician wants you to follow-up in: 1 year with Dr. Graciela Husbands. You will receive a reminder letter in the mail two months in advance. If you don't receive a letter, please call our office to schedule the follow-up appointment.  Remote monitoring is used to monitor your Pacemaker of ICD from home. This monitoring reduces the number of office visits required to check your device to one time per year. It allows [place] to keep an eye on the functioning of your device to ensure it is working properly. You are scheduled for a device check from home on 01/12/17. You may send your transmission at any time that day. If you have a wireless device, the transmission will be sent automatically. After your physician reviews your transmission, you will receive a postcard with your next transmission date.   Any Other Special Instructions Will Be Listed Below (If Applicable).     If you need a refill on your cardiac medications before your next appointment, please call your pharmacy.

## 2016-10-18 MED FILL — metFORMIN HCL 1000 MG TABS: 1000 | 30 days supply | Qty: 60 | Fill #2

## 2016-10-20 MED FILL — ONE TOUCH VERIO TEST STRIP: 50 days supply | Qty: 50 | Fill #3

## 2016-10-28 MED FILL — INVOKANA 300 MG TABLET: 300 | 30 days supply | Qty: 30 | Fill #8

## 2016-11-01 ENCOUNTER — Ambulatory Visit (INDEPENDENT_AMBULATORY_CARE_PROVIDER_SITE_OTHER): Payer: Medicare Other | Admitting: *Deleted

## 2016-11-01 DIAGNOSIS — I428 Other cardiomyopathies: Secondary | ICD-10-CM | POA: Diagnosis not present

## 2016-11-01 MED FILL — SPIRONOLACTONE 25 MG TABLET: 25 | 30 days supply | Qty: 30 | Fill #8

## 2016-11-01 NOTE — Progress Notes (Signed)
Remote ICD transmission.   

## 2016-11-05 ENCOUNTER — Encounter: Payer: Self-pay | Admitting: Cardiology

## 2016-11-11 MED FILL — CARVEDILOL 25 MG TABLET: 25 | 30 days supply | Qty: 60 | Fill #2

## 2016-11-11 MED FILL — DOFETILIDE 250 MCG CAPSULE: 250 | 30 days supply | Qty: 60 | Fill #6

## 2016-11-16 MED FILL — AMOXICILLIN 500 MG CAPSULE: 500 | 1 days supply | Qty: 4 | Fill #0

## 2016-11-29 MED FILL — SPIRONOLACTONE 25 MG TABLET: 25 | 30 days supply | Qty: 30 | Fill #9

## 2016-11-29 MED FILL — INVOKANA 300 MG TABLET: 300 | 30 days supply | Qty: 30 | Fill #9

## 2016-11-29 MED FILL — LISINOPRIL 40 MG TAB: 40 | 90 days supply | Qty: 90 | Fill #0

## 2016-12-07 LAB — CUP PACEART REMOTE DEVICE CHECK
Battery Remaining Longevity: 96 mo
Brady Statistic RA Percent Paced: 99 %
Brady Statistic RV Percent Paced: 99 %
Date Time Interrogation Session: 20180730100900
HIGH POWER IMPEDANCE MEASURED VALUE: 47 Ohm
Implantable Lead Implant Date: 20111201
Implantable Lead Location: 753858
Implantable Lead Location: 753859
Implantable Lead Model: 158
Implantable Lead Model: 4396
Implantable Lead Serial Number: 302703
Lead Channel Impedance Value: 510 Ohm
Lead Channel Impedance Value: 985 Ohm
Lead Channel Pacing Threshold Amplitude: 0.7 V
Lead Channel Pacing Threshold Pulse Width: 0.4 ms
Lead Channel Setting Pacing Amplitude: 2 V
Lead Channel Setting Pacing Amplitude: 2.4 V
Lead Channel Setting Sensing Sensitivity: 1 mV
MDC IDC LEAD IMPLANT DT: 20111201
MDC IDC LEAD IMPLANT DT: 20111201
MDC IDC LEAD LOCATION: 753860
MDC IDC MSMT BATTERY REMAINING PERCENTAGE: 100 %
MDC IDC MSMT LEADCHNL LV PACING THRESHOLD AMPLITUDE: 1.8 V
MDC IDC MSMT LEADCHNL LV PACING THRESHOLD PULSEWIDTH: 1.5 ms
MDC IDC MSMT LEADCHNL RV IMPEDANCE VALUE: 690 Ohm
MDC IDC MSMT LEADCHNL RV PACING THRESHOLD AMPLITUDE: 1 V
MDC IDC MSMT LEADCHNL RV PACING THRESHOLD PULSEWIDTH: 0.4 ms
MDC IDC PG IMPLANT DT: 20160817
MDC IDC SET LEADCHNL LV PACING AMPLITUDE: 2.2 V
MDC IDC SET LEADCHNL LV PACING PULSEWIDTH: 1.5 ms
MDC IDC SET LEADCHNL RV PACING PULSEWIDTH: 0.4 ms
MDC IDC SET LEADCHNL RV SENSING SENSITIVITY: 0.5 mV
Pulse Gen Serial Number: 113381

## 2016-12-17 MED FILL — metFORMIN HCL 1000 MG TABS: 1000 | 30 days supply | Qty: 60 | Fill #0

## 2016-12-24 MED FILL — INVOKANA 300 MG TABLET: 300 | 30 days supply | Qty: 30 | Fill #10

## 2016-12-31 MED FILL — SPIRONOLACTONE 25 MG TABLET: 25 | 30 days supply | Qty: 30 | Fill #10

## 2017-01-12 ENCOUNTER — Telehealth: Payer: Self-pay | Admitting: *Deleted

## 2017-01-12 ENCOUNTER — Other Ambulatory Visit: Payer: Self-pay | Admitting: Internal Medicine

## 2017-01-12 MED FILL — CARVEDILOL 25 MG TABLET: 25 | 30 days supply | Qty: 60 | Fill #3

## 2017-01-12 NOTE — Telephone Encounter (Signed)
Pharmacist from [facility] called for clarification on the rx for tikosyn that was sent in today. It was sent in as qd but pharmacist states that this is normally a bid medication. Per office visit 10/13/16 patient is only taking qd due to cost. Also per that office visit note: We also discussed the importance of taking Tikosyn twice daily as prescribed. He is also clear in his decision to only take daily. Okay for the pharmacy to dispense for qd as written? Please advise. Thanks, MI

## 2017-01-13 MED ORDER — DOFETILIDE 250 MCG PO CAPS: 250.0000 ug | ORAL_CAPSULE | Freq: Two times a day (BID) | ORAL | 11 refills | Status: DC

## 2017-01-13 MED FILL — DOFETILIDE 250 MCG CAPSULE: 250 | 30 days supply | Qty: 60 | Fill #0

## 2017-01-13 NOTE — Telephone Encounter (Signed)
New Rx for Tikosyn 250 mcg BID sent to [facility], per Gypsy Balsam, NP.

## 2017-01-13 NOTE — Telephone Encounter (Signed)
Please send in as BID dosing

## 2017-01-13 NOTE — Telephone Encounter (Signed)
Would write for bid dosing  Gypsy Balsam, NP 01/13/2017 6:05 AM

## 2017-01-20 MED FILL — INVOKANA 300 MG TABLET: 300 | 30 days supply | Qty: 30 | Fill #11

## 2017-01-24 MED FILL — ONE TOUCH VERIO TEST STRIP: 50 days supply | Qty: 50 | Fill #4

## 2017-01-24 MED FILL — ERYTHROMYCIN EYE OINTMENT: 5 | 10 days supply | Qty: 4 | Fill #0

## 2017-01-28 MED FILL — SPIRONOLACTONE 25 MG TABLET: 25 | 30 days supply | Qty: 30 | Fill #11

## 2017-02-02 ENCOUNTER — Ambulatory Visit (INDEPENDENT_AMBULATORY_CARE_PROVIDER_SITE_OTHER): Payer: Medicare Other | Admitting: *Deleted

## 2017-02-02 DIAGNOSIS — I428 Other cardiomyopathies: Secondary | ICD-10-CM

## 2017-02-04 ENCOUNTER — Encounter: Payer: Self-pay | Admitting: Cardiology

## 2017-02-04 NOTE — Progress Notes (Signed)
Remote ICD transmission.   

## 2017-02-10 MED FILL — metFORMIN HCL 1000 MG TABS: 1000 | 30 days supply | Qty: 60 | Fill #1

## 2017-02-11 MED FILL — ERYTHROMYCIN EYE OINTMENT: 5 | 10 days supply | Qty: 4 | Fill #1

## 2017-02-18 MED FILL — INVOKANA 300 MG TABLET: 300 | 30 days supply | Qty: 30 | Fill #0

## 2017-02-25 ENCOUNTER — Other Ambulatory Visit: Payer: Self-pay | Admitting: Cardiology

## 2017-02-28 MED FILL — SPIRONOLACTONE 25 MG TABLET: 25 | 30 days supply | Qty: 30 | Fill #0

## 2017-03-01 LAB — CUP PACEART REMOTE DEVICE CHECK
Brady Statistic RA Percent Paced: 96 %
HighPow Impedance: 52 Ohm
Implantable Lead Implant Date: 20111201
Implantable Lead Location: 753858
Implantable Lead Location: 753860
Implantable Lead Model: 158
Implantable Lead Model: 4396
Implantable Lead Model: 5076
Implantable Lead Serial Number: 302703
Implantable Pulse Generator Implant Date: 20160817
Lead Channel Impedance Value: 735 Ohm
Lead Channel Pacing Threshold Amplitude: 1 V
Lead Channel Pacing Threshold Pulse Width: 0.4 ms
Lead Channel Setting Pacing Amplitude: 2 V
Lead Channel Setting Pacing Amplitude: 2.2 V
Lead Channel Setting Pacing Amplitude: 2.4 V
Lead Channel Setting Pacing Pulse Width: 0.4 ms
MDC IDC LEAD IMPLANT DT: 20111201
MDC IDC LEAD IMPLANT DT: 20111201
MDC IDC LEAD LOCATION: 753859
MDC IDC MSMT BATTERY REMAINING LONGEVITY: 90 mo
MDC IDC MSMT BATTERY REMAINING PERCENTAGE: 100 %
MDC IDC MSMT LEADCHNL LV IMPEDANCE VALUE: 1035 Ohm
MDC IDC MSMT LEADCHNL LV PACING THRESHOLD AMPLITUDE: 1.8 V
MDC IDC MSMT LEADCHNL LV PACING THRESHOLD PULSEWIDTH: 1.5 ms
MDC IDC MSMT LEADCHNL RA IMPEDANCE VALUE: 532 Ohm
MDC IDC SESS DTM: 20181030081100
MDC IDC SET LEADCHNL LV PACING PULSEWIDTH: 1.5 ms
MDC IDC SET LEADCHNL LV SENSING SENSITIVITY: 1 mV
MDC IDC SET LEADCHNL RV SENSING SENSITIVITY: 0.5 mV
MDC IDC STAT BRADY RV PERCENT PACED: 95 %
Pulse Gen Serial Number: 113381

## 2017-03-07 MED FILL — LISINOPRIL 40 MG TABS: 40 | 90 days supply | Qty: 90 | Fill #1

## 2017-03-14 MED FILL — CARVEDILOL 25 MG TABLET: 25 | 30 days supply | Qty: 60 | Fill #4

## 2017-03-18 MED FILL — DOFETILIDE 250 MCG CAPSULE: 250 | 30 days supply | Qty: 60 | Fill #1

## 2017-03-28 MED FILL — INVOKANA 300 MG TABLET: 300 | 30 days supply | Qty: 30 | Fill #1

## 2017-03-28 MED FILL — SPIRONOLACTONE 25 MG TABS: 25 | 30 days supply | Qty: 30 | Fill #1

## 2017-04-11 MED FILL — metFORMIN HCL 1000 MG TABS: 1000 | 30 days supply | Qty: 60 | Fill #2

## 2017-04-18 MED FILL — ONETOUCH VERIO TEST STRIP: 90 days supply | Qty: 100 | Fill #0

## 2017-04-22 MED FILL — INVOKANA 300 MG TABLET: 300 | 30 days supply | Qty: 30 | Fill #2

## 2017-04-25 MED FILL — SPIRONOLACTONE 25 MG TABS: 25 | 30 days supply | Qty: 30 | Fill #2

## 2017-05-04 ENCOUNTER — Ambulatory Visit (INDEPENDENT_AMBULATORY_CARE_PROVIDER_SITE_OTHER): Payer: Medicare Other | Admitting: *Deleted

## 2017-05-04 DIAGNOSIS — I428 Other cardiomyopathies: Secondary | ICD-10-CM

## 2017-05-04 NOTE — Progress Notes (Signed)
Remote ICD transmission.   

## 2017-05-05 ENCOUNTER — Encounter: Payer: Self-pay | Admitting: Cardiology

## 2017-05-09 MED FILL — CARVEDILOL 25 MG TABLET: 25 | 30 days supply | Qty: 60 | Fill #5

## 2017-05-16 MED FILL — DOFETILIDE 250 MCG CAPS: 250 | 30 days supply | Qty: 60 | Fill #2

## 2017-05-16 NOTE — Progress Notes (Signed)
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 Note a cardiac catheterization in April of 2009 prior to his procedure showed an ejection fraction of 20%, no coronary disease, a dilated aortic root, moderate aortic stenosis and mild mitral regurgitation. The patient subsequently had aortic valve replacement with 25 mm Carpentier-Edwards valve, replacement of ascending aortic aneurysm with 38-mm Hemishield graft, modified Cox maze III. Note he did develop atrial fibrillation 6 months after his procedure. A cardioversion was attempted by his report but he only held sinus for one week. Patient had CRT-D on December 2 of 2011. He was placed on tikosyn for his atrial fibrillation/flutter. His dose was decreased because of increasing QT. CTA of the thoracic aorta in November 2014 showed normal patency of the aortic root graft with no aneurysmal disease of the rest of the thoracic aorta. 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 Follow-up recommended 3 years. Last echocardiogram December 2016 showed normal LV function, bioprosthetic aortic valve with mean gradient 12 mmHg, aortic root 38 mm. Since I last saw him, the patient denies any dyspnea on exertion, orthopnea, PND, pedal edema, palpitations, syncope or chest pain.   Current Outpatient Medications  Medication Sig Dispense Refill  . amoxicillin (AMOXIL) 500 MG capsule TAKE 4 CAPSULES (2,000 MG TOTAL) BY MOUTH ONCE AS NEEDED (FOR DENTAL PROCEDURES). 4 capsule 3  . aspirin EC 81 MG tablet Take 81 mg by mouth daily.    . carvedilol (COREG) 25 MG tablet TAKE 1 TABLET BY MOUTH TWICE DAILY WITH A MEAL (Patient taking differently: TAKE 1 TABLET BY MOUTH once daily) 60 tablet 6  . dofetilide (TIKOSYN) 250 MCG capsule Take 1 capsule (250 mcg total) by mouth 2 (two) times daily. 60 capsule 11  .  furosemide (LASIX) 40 MG tablet Take 40 mg by mouth daily as needed (swelling).     . INVOKANA 300 MG TABS tablet Take 300 mg by mouth daily.  12  . lisinopril (PRINIVIL,ZESTRIL) 40 MG tablet Take 40 mg by mouth daily.    . metFORMIN (GLUCOPHAGE) 1000 MG tablet Take 1,000 mg by mouth daily with breakfast.   4  . spironolactone (ALDACTONE) 25 MG tablet TAKE 1 TABLET (25 MG) BY MOUTH DAILY. 30 tablet 11   No current facility-administered medications for this visit.      Past Medical History:  Diagnosis Date  . Aortic stenosis/ bicuspid valve--s/p AVR   . Ascending aortic aneurysm (HCC)   . Atrial fibrillation/flutter   . Biventricular implantable cardiac defibrillator- BSX   . Cholelithiases   . Diabetes mellitus without complication (HCC)   . DJD (degenerative joint disease)   . Hypertension   . LBBB (left bundle branch block)   . Non-ischemic cardiomyopathy (HCC)   . OSA (obstructive sleep apnea)     Past Surgical History:  Procedure Laterality Date  . AORTIC VALVE REPLACEMENT    . APPENDECTOMY    . CERVICAL DISCECTOMY  5/01   C7  . CHOLECYSTECTOMY    . EP IMPLANTABLE DEVICE N/A 11/20/2014   Procedure:  ICD Generator Changeout;  Surgeon: Duke Salvia, MD;  Location: [facility] INVASIVE CV LAB;  Service: Cardiovascular;  Laterality: N/A;  . MEDIAN STERNOTOMY    . TONSILLECTOMY      Social History   Socioeconomic History  . Marital status: Married    Spouse  name: Not on file  . Number of children: 2  . Years of education: Not on file  . Highest education level: Not on file  Social Needs  . Financial resource strain: Not on file  . Food insecurity - worry: Not on file  . Food insecurity - inability: Not on file  . Transportation needs - medical: Not on file  . Transportation needs - non-medical: Not on file  Occupational History  . Occupation: retired Art gallery manager  Tobacco Use  . Smoking status: Former Games developer  . Smokeless tobacco: Never Used  Substance and Sexual Activity    . Alcohol use: No  . Drug use: No  . Sexual activity: Not on file  Other Topics Concern  . Not on file  Social History Narrative  . Not on file    Family History  Problem Relation Age of Onset  . Emphysema Father   . Leukemia Mother   . Hypertension Maternal Grandmother   . Stroke Maternal Grandmother   . Congestive Heart Failure Sister   . Diabetes Brother   . Alzheimer's disease Brother     ROS: no fevers or chills, productive cough, hemoptysis, dysphasia, odynophagia, melena, hematochezia, dysuria, hematuria, rash, seizure activity, orthopnea, PND, pedal edema, claudication. Remaining systems are negative.  Physical Exam: Well-developed well-nourished in no acute distress.  Skin is warm and dry.  HEENT is normal.  Neck is supple.  Chest is clear to auscultation with normal expansion.  Cardiovascular exam is regular rate and rhythm.  Abdominal exam nontender or distended. No masses palpated. Extremities show no edema. neuro grossly intact  ECG- AV paced; personally reviewed  A/P  1 history of nonischemic cardiomyopathy-most recent echocardiogram showed improvement in LV function.  Continue ACEI and beta-blocker.    2 status post aortic valve replacement-Continue SBE prophylaxis.  3 hypertension-blood pressure is controlled.  Continue present medications but change coreg to 12.5 BID.  4 prior biventricular ICD-followed by electrophysiology.  5 history of atrial fibrillation-patient holding sinus rhythm.  Continue tikosyn, beta-blocker. Check Bmet, Mg.  Patient discontinued Coumadin previously on his own.  He declines anticoagulation and understands the risk of embolic CVA.  6 Abdominal aortic aneurysm-CTA to further assess.  7 history of thoracic aortic aneurysm repair-we will repeat CTA to assess.  Olga Millers, MD

## 2017-05-23 LAB — CUP PACEART REMOTE DEVICE CHECK
Brady Statistic RA Percent Paced: 95 %
Brady Statistic RV Percent Paced: 93 %
Date Time Interrogation Session: 20190130092400
HIGH POWER IMPEDANCE MEASURED VALUE: 48 Ohm
Implantable Lead Implant Date: 20111201
Implantable Lead Location: 753860
Implantable Lead Model: 5076
Implantable Lead Serial Number: 302703
Implantable Pulse Generator Implant Date: 20160817
Lead Channel Impedance Value: 522 Ohm
Lead Channel Impedance Value: 701 Ohm
Lead Channel Pacing Threshold Amplitude: 0.7 V
Lead Channel Pacing Threshold Amplitude: 1 V
Lead Channel Pacing Threshold Pulse Width: 0.4 ms
Lead Channel Setting Pacing Pulse Width: 0.4 ms
Lead Channel Setting Sensing Sensitivity: 1 mV
MDC IDC LEAD IMPLANT DT: 20111201
MDC IDC LEAD IMPLANT DT: 20111201
MDC IDC LEAD LOCATION: 753858
MDC IDC LEAD LOCATION: 753859
MDC IDC MSMT BATTERY REMAINING LONGEVITY: 90 mo
MDC IDC MSMT BATTERY REMAINING PERCENTAGE: 100 %
MDC IDC MSMT LEADCHNL LV IMPEDANCE VALUE: 978 Ohm
MDC IDC MSMT LEADCHNL LV PACING THRESHOLD AMPLITUDE: 1.8 V
MDC IDC MSMT LEADCHNL LV PACING THRESHOLD PULSEWIDTH: 1.5 ms
MDC IDC MSMT LEADCHNL RV PACING THRESHOLD PULSEWIDTH: 0.4 ms
MDC IDC SET LEADCHNL LV PACING AMPLITUDE: 2.2 V
MDC IDC SET LEADCHNL LV PACING PULSEWIDTH: 1.5 ms
MDC IDC SET LEADCHNL RA PACING AMPLITUDE: 2 V
MDC IDC SET LEADCHNL RV PACING AMPLITUDE: 2.4 V
MDC IDC SET LEADCHNL RV SENSING SENSITIVITY: 0.5 mV
Pulse Gen Serial Number: 113381

## 2017-05-23 MED FILL — SPIRONOLACTONE 25 MG TABS: 25 | 30 days supply | Qty: 30 | Fill #3

## 2017-05-23 MED FILL — INVOKANA 300 MG TABLET: 300 | 30 days supply | Qty: 30 | Fill #3

## 2017-05-23 MED FILL — LISINOPRIL 40 MG TABS: 40 | 90 days supply | Qty: 90 | Fill #2

## 2017-05-26 ENCOUNTER — Encounter: Payer: Self-pay | Admitting: Cardiology

## 2017-05-26 ENCOUNTER — Ambulatory Visit: Payer: Medicare Other | Admitting: Cardiology

## 2017-05-26 VITALS — BP 110/70 | HR 75 | Ht 70.0 in | Wt 217.4 lb

## 2017-05-26 DIAGNOSIS — I712 Thoracic aortic aneurysm, without rupture, unspecified: Secondary | ICD-10-CM

## 2017-05-26 DIAGNOSIS — I714 Abdominal aortic aneurysm, without rupture, unspecified: Secondary | ICD-10-CM

## 2017-05-26 DIAGNOSIS — I10 Essential (primary) hypertension: Secondary | ICD-10-CM

## 2017-05-26 DIAGNOSIS — Z952 Presence of prosthetic heart valve: Secondary | ICD-10-CM

## 2017-05-26 DIAGNOSIS — I480 Paroxysmal atrial fibrillation: Secondary | ICD-10-CM | POA: Diagnosis not present

## 2017-05-26 LAB — MAGNESIUM: MAGNESIUM: 2.8 mg/dL — AB (ref 1.6–2.3)

## 2017-05-26 MED ORDER — CARVEDILOL 12.5 MG PO TABS: 12.5000 mg | ORAL_TABLET | Freq: Two times a day (BID) | ORAL | 3 refills | Status: DC

## 2017-05-26 NOTE — Patient Instructions (Signed)
Medication Instructions:   DECREASE CARVEDILOL TO 12.5 MG TWICE DAILY= 1/2 OF THE 25 MG TABLET TWICE DAILY  Labwork:  Your physician recommends that you HAVE LAB WORK TODAY  Testing/Procedures:  CTA OF CHEST AND ABDOMEN W/WO TO FOLLOW UP THORACIC AND ABDOMINAL ANEURYSM-SCHEDULE IN THE [facility]  Follow-Up:  Your physician wants you to follow-up in: ONE YEAR WITH DR Jens Som [place] You will receive a reminder letter in the mail two months in advance. If you don't receive a letter, please call our office to schedule the follow-up appointment.   If you need a refill on your cardiac medications before your next appointment, please call your pharmacy.

## 2017-05-27 ENCOUNTER — Telehealth: Payer: Self-pay | Admitting: Cardiology

## 2017-05-27 ENCOUNTER — Ambulatory Visit (HOSPITAL_BASED_OUTPATIENT_CLINIC_OR_DEPARTMENT_OTHER)
Admission: RE | Admit: 2017-05-27 | Discharge: 2017-05-27 | Disposition: A | Discharge status: Home / Self Care | Payer: Medicare Other | Source: Ambulatory Visit | Attending: Cardiology | Admitting: Cardiology

## 2017-05-27 DIAGNOSIS — I712 Thoracic aortic aneurysm, without rupture, unspecified: Secondary | ICD-10-CM

## 2017-05-27 DIAGNOSIS — I714 Abdominal aortic aneurysm, without rupture, unspecified: Secondary | ICD-10-CM

## 2017-05-27 NOTE — Telephone Encounter (Signed)
Opened in error

## 2017-05-31 ENCOUNTER — Ambulatory Visit (HOSPITAL_BASED_OUTPATIENT_CLINIC_OR_DEPARTMENT_OTHER)
Admission: RE | Admit: 2017-05-31 | Discharge: 2017-05-31 | Disposition: A | Discharge status: Home / Self Care | Payer: Medicare Other | Source: Ambulatory Visit | Attending: Cardiology | Admitting: Cardiology

## 2017-05-31 DIAGNOSIS — Z9889 Other specified postprocedural states: Secondary | ICD-10-CM | POA: Insufficient documentation

## 2017-05-31 DIAGNOSIS — I714 Abdominal aortic aneurysm, without rupture: Secondary | ICD-10-CM | POA: Insufficient documentation

## 2017-05-31 MED ORDER — IOPAMIDOL (ISOVUE-370) INJECTION 76%
100.0000 mL | Freq: Once | INTRAVENOUS | Status: AC | PRN
  Administered 2017-05-31: 100 mL via INTRAVENOUS

## 2017-06-02 LAB — BASIC METABOLIC PANEL
BUN/Creatinine Ratio: 25 — ABNORMAL HIGH (ref 10–24)
BUN: 28 mg/dL — AB (ref 8–27)
CALCIUM: 9.9 mg/dL (ref 8.6–10.2)
CHLORIDE: 105 mmol/L (ref 96–106)
CO2: 20 mmol/L (ref 20–29)
CREATININE: 1.14 mg/dL (ref 0.76–1.27)
GFR, EST AFRICAN AMERICAN: 71 mL/min/{1.73_m2} (ref >59)
GFR, EST NON AFRICAN AMERICAN: 62 mL/min/{1.73_m2} (ref >59)
Glucose: 187 mg/dL — ABNORMAL HIGH (ref 65–99)
Potassium: 5 mmol/L (ref 3.5–5.2)
Sodium: 143 mmol/L (ref 134–144)

## 2017-06-02 LAB — SPECIMEN STATUS REPORT

## 2017-06-13 MED FILL — CARVEDILOL 12.5 MG TABLET: 12.5 | 90 days supply | Qty: 180 | Fill #0

## 2017-06-13 MED FILL — metFORMIN HCL 1000 MG TABS: 1000 | 30 days supply | Qty: 60 | Fill #3

## 2017-06-21 MED FILL — SPIRONOLACTONE 25 MG TABS: 25 | 30 days supply | Qty: 30 | Fill #4

## 2017-06-21 MED FILL — INVOKANA 300 MG TABLET: 300 | 30 days supply | Qty: 30 | Fill #4

## 2017-07-13 MED FILL — DOFETILIDE 250 MCG CAPSULE: 250 | 30 days supply | Qty: 60 | Fill #3

## 2017-07-20 MED FILL — INVOKANA 300 MG TABLET: 300 | 30 days supply | Qty: 30 | Fill #5

## 2017-07-20 MED FILL — SPIRONOLACTONE 25 MG TABS: 25 | 30 days supply | Qty: 30 | Fill #5

## 2017-08-03 ENCOUNTER — Ambulatory Visit (INDEPENDENT_AMBULATORY_CARE_PROVIDER_SITE_OTHER): Payer: Medicare Other | Admitting: *Deleted

## 2017-08-03 DIAGNOSIS — I428 Other cardiomyopathies: Secondary | ICD-10-CM

## 2017-08-03 NOTE — Progress Notes (Signed)
Remote ICD transmission.   

## 2017-08-04 ENCOUNTER — Encounter: Payer: Self-pay | Admitting: Cardiology

## 2017-08-04 LAB — CUP PACEART REMOTE DEVICE CHECK
Battery Remaining Longevity: 90 mo
Battery Remaining Percentage: 100 %
Brady Statistic RA Percent Paced: 94 %
Brady Statistic RV Percent Paced: 92 %
Date Time Interrogation Session: 20190501081100
HighPow Impedance: 50 Ohm
Implantable Lead Implant Date: 20111201
Implantable Lead Location: 753858
Implantable Lead Location: 753859
Implantable Lead Model: 158
Implantable Lead Model: 4396
Implantable Lead Model: 5076
Implantable Lead Serial Number: 302703
Implantable Pulse Generator Implant Date: 20160817
Lead Channel Impedance Value: 534 Ohm
Lead Channel Impedance Value: 999 Ohm
Lead Channel Pacing Threshold Amplitude: 0.7 V
Lead Channel Pacing Threshold Amplitude: 1.8 V
Lead Channel Pacing Threshold Pulse Width: 0.4 ms
Lead Channel Pacing Threshold Pulse Width: 1.5 ms
Lead Channel Setting Pacing Amplitude: 2 V
Lead Channel Setting Sensing Sensitivity: 0.5 mV
Lead Channel Setting Sensing Sensitivity: 1 mV
MDC IDC LEAD IMPLANT DT: 20111201
MDC IDC LEAD IMPLANT DT: 20111201
MDC IDC LEAD LOCATION: 753860
MDC IDC MSMT LEADCHNL RV IMPEDANCE VALUE: 729 Ohm
MDC IDC MSMT LEADCHNL RV PACING THRESHOLD AMPLITUDE: 1 V
MDC IDC MSMT LEADCHNL RV PACING THRESHOLD PULSEWIDTH: 0.4 ms
MDC IDC PG SERIAL: 113381
MDC IDC SET LEADCHNL LV PACING AMPLITUDE: 2.2 V
MDC IDC SET LEADCHNL LV PACING PULSEWIDTH: 1.5 ms
MDC IDC SET LEADCHNL RV PACING AMPLITUDE: 2.4 V
MDC IDC SET LEADCHNL RV PACING PULSEWIDTH: 0.4 ms

## 2017-08-11 MED FILL — metFORMIN HCL 1000 MG TABS: 1000 | 30 days supply | Qty: 60 | Fill #4

## 2017-08-19 MED FILL — SPIRONOLACTONE 25 MG TABS: 25 | 30 days supply | Qty: 30 | Fill #6

## 2017-08-19 MED FILL — LISINOPRIL 40 MG TABS: 40 | 90 days supply | Qty: 90 | Fill #0

## 2017-08-19 MED FILL — INVOKANA 300 MG TABLET: 300 | 30 days supply | Qty: 30 | Fill #6

## 2017-08-23 MED FILL — ONETOUCH VERIO TEST STRIP: 90 days supply | Qty: 100 | Fill #1

## 2017-09-07 MED FILL — DOFETILIDE 250 MCG CAPS: 250 | 30 days supply | Qty: 60 | Fill #4

## 2017-09-16 MED FILL — SPIRONOLACTONE 25 MG TABS: 25 | 30 days supply | Qty: 30 | Fill #7

## 2017-09-23 MED FILL — INVOKANA 300 MG TABLET: 300 | 30 days supply | Qty: 30 | Fill #7

## 2017-10-05 ENCOUNTER — Ambulatory Visit: Payer: Medicare Other | Admitting: Internal Medicine

## 2017-10-05 VITALS — BP 122/76 | HR 75 | Resp 17 | Ht 70.0 in | Wt 220.4 lb

## 2017-10-05 DIAGNOSIS — I5022 Chronic systolic (congestive) heart failure: Secondary | ICD-10-CM | POA: Diagnosis not present

## 2017-10-05 DIAGNOSIS — I714 Abdominal aortic aneurysm, without rupture, unspecified: Secondary | ICD-10-CM

## 2017-10-05 DIAGNOSIS — I480 Paroxysmal atrial fibrillation: Secondary | ICD-10-CM

## 2017-10-05 DIAGNOSIS — I428 Other cardiomyopathies: Secondary | ICD-10-CM | POA: Diagnosis not present

## 2017-10-05 LAB — BASIC METABOLIC PANEL
BUN/Creatinine Ratio: 20 (ref 10–24)
BUN: 24 mg/dL (ref 8–27)
CO2: 22 mmol/L (ref 20–29)
Calcium: 9.5 mg/dL (ref 8.6–10.2)
Chloride: 101 mmol/L (ref 96–106)
Creatinine, Ser: 1.22 mg/dL (ref 0.76–1.27)
GFR calc Af Amer: 66 mL/min/{1.73_m2} (ref >59)
GFR calc non Af Amer: 57 mL/min/{1.73_m2} — ABNORMAL LOW (ref >59)
GLUCOSE: 182 mg/dL — AB (ref 65–99)
POTASSIUM: 4.9 mmol/L (ref 3.5–5.2)
Sodium: 139 mmol/L (ref 134–144)

## 2017-10-05 LAB — MAGNESIUM: MAGNESIUM: 2.3 mg/dL (ref 1.6–2.3)

## 2017-10-05 NOTE — Patient Instructions (Signed)
Medication Instructions:  Your physician recommends that you continue on your current medications as directed. Please refer to the Current Medication list given to you today.  Labwork: You will have labs drawn today: BMP and Mg  Testing/Procedures: None ordered.  Follow-Up: Your physician wants you to follow-up in: One Year with Dr Graciela Husbands. You will receive a reminder letter in the mail two months in advance. If you don't receive a letter, please call our office to schedule the follow-up appointment.  Remote monitoring is used to monitor your ICD from home. This monitoring reduces the number of office visits required to check your device to one time per year. It allows [place] to keep an eye on the functioning of your device to ensure it is working properly. You are scheduled for a device check from home on 11/02/2017. You may send your transmission at any time that day. If you have a wireless device, the transmission will be sent automatically. After your physician reviews your transmission, you will receive a postcard with your next transmission date.    Any Other Special Instructions Will Be Listed Below (If Applicable).     If you need a refill on your cardiac medications before your next appointment, please call your pharmacy.

## 2017-10-05 NOTE — Progress Notes (Signed)
Patient Care Team: [name], Adelfa Koh, MD as PCP - General   HPI  [name] is a 77 y.o. male Seen in followup for CRT-D implantation in the fall 2011. He underwent device replacement 8/16 He also had a history of atrial flutter.   In 2009 he underwent  porcine aortic valve replacement with 25 mm Carpentier-Edwards valve, replacement of ascending aortic aneurysm with 38-mm Hemishield graft, as well as Cox maze procedure for atrial fibrillation.  Nonischemic cardiomyopathy  He takes dofetilide; previously anticoagulation but now on ASA  TERF include age, CAD DM CHF for CHADSVASc> 5      DATE TEST EF   4/09 LHC  20 %   8//14 Echo   50 %   12/16 Echo  60-65% Mean A Grad 12   Aortic arch was reviewed by CT scanning 2/19 4.1 cm  Date Cr K Hgb  2/19 1.14 5.0>>5.1 14.9 (4/19)      The patient denies chest pain, shortness of breath, nocturnal dyspnea, orthopnea or peripheral edema.  There have been no palpitations, lightheadedness or syncope.     Past Medical History:  Diagnosis Date  . Aortic stenosis/ bicuspid valve--s/p AVR   . Ascending aortic aneurysm (HCC)   . Atrial fibrillation/flutter   . Biventricular implantable cardiac defibrillator- BSX   . Cholelithiases   . Diabetes mellitus without complication (HCC)   . DJD (degenerative joint disease)   . Hypertension   . LBBB (left bundle branch block)   . Non-ischemic cardiomyopathy (HCC)   . OSA (obstructive sleep apnea)     Past Surgical History:  Procedure Laterality Date  . AORTIC VALVE REPLACEMENT    . APPENDECTOMY    . CERVICAL DISCECTOMY  5/01   C7  . CHOLECYSTECTOMY    . EP IMPLANTABLE DEVICE N/A 11/20/2014   Procedure:  ICD Generator Changeout;  Surgeon: Duke Salvia, MD;  Location: [facility] INVASIVE CV LAB;  Service: Cardiovascular;  Laterality: N/A;  . MEDIAN STERNOTOMY    . TONSILLECTOMY      Current Outpatient Medications  Medication Sig Dispense Refill  . aspirin EC 81 MG tablet Take 81  mg by mouth daily.    . carvedilol (COREG) 12.5 MG tablet Take 1 tablet (12.5 mg total) by mouth 2 (two) times daily with a meal. 180 tablet 3  . dofetilide (TIKOSYN) 250 MCG capsule Take 1 capsule (250 mcg total) by mouth 2 (two) times daily. 60 capsule 11  . furosemide (LASIX) 40 MG tablet Take 40 mg by mouth daily as needed (swelling).     . INVOKANA 300 MG TABS tablet Take 300 mg by mouth daily.  12  . lisinopril (PRINIVIL,ZESTRIL) 40 MG tablet Take 40 mg by mouth daily.    . metFORMIN (GLUCOPHAGE) 1000 MG tablet Take 1,000 mg by mouth daily with breakfast.   4  . spironolactone (ALDACTONE) 25 MG tablet TAKE 1 TABLET (25 MG) BY MOUTH DAILY. 30 tablet 11  . amoxicillin (AMOXIL) 500 MG capsule TAKE 4 CAPSULES (2,000 MG TOTAL) BY MOUTH ONCE AS NEEDED (FOR DENTAL PROCEDURES). (Patient not taking: Reported on 10/05/2017) 4 capsule 3   No current facility-administered medications for this visit.     No Known Allergies  Review of Systems negative except from HPI and PMH  Physical Exam BP 122/76   Pulse 75   Resp 17   Ht 5\' 10"  (1.778 m)   Wt 220 lb 6.4 oz (100 kg)   BMI 31.62  kg/m  Well developed and nourished in no acute distress HENT normal Neck supple with JVP-flat Clear Regular rate and rhythm, 1/6 s=murmurs or gallops Abd-soft with active BS No Clubbing cyanosis edema Skin-warm and dry A & Oriented  Grossly normal sensory and motor function   ECG demonstrates AV pacing upright QRS V1 and neg 1 QTc 544 ( QRSd 178)   Assessment and  Plan  Nonischemic cardiomyopathy- resolved  Aortic stenosis S./P. bioprosthetic replacement  Implantable defibrillator-CRT-[employer]  The patient's device was interrogated and the information was fully reviewed.  The device was reprogrammed to shorten  AV delay to increase BiV pacing percentage  Congestive heart.-Chronic mixed   PVCs     Atrial fibrillation-dofetilide  Chronotropic incompetence  Hyperkalemia  Reprogrammed  device as noted  With elevated K will recheck bmet and prob decrease aldactone 25>>12.5  Short nonsustained atrial fib, no clear indication for anticoagulation  FewPVCs  Tolerating dofetilide

## 2017-10-07 ENCOUNTER — Telehealth: Payer: Self-pay | Admitting: Internal Medicine

## 2017-10-07 MED FILL — metFORMIN HCL 1000 MG TABS: 1000 | 90 days supply | Qty: 180 | Fill #0

## 2017-10-07 MED FILL — CARVEDILOL 12.5 MG TABLET: 12.5 | 90 days supply | Qty: 180 | Fill #1

## 2017-10-07 NOTE — Telephone Encounter (Signed)
Called pt and left message for pt to call back to get lab results per Dr. Klein. °

## 2017-10-17 MED FILL — INVOKANA 300 MG TABLET: 300 | 30 days supply | Qty: 30 | Fill #8

## 2017-10-24 MED FILL — SPIRONOLACTONE 25 MG TABLET: 25 | 30 days supply | Qty: 30 | Fill #8

## 2017-11-02 ENCOUNTER — Encounter: Payer: Self-pay | Admitting: Cardiology

## 2017-11-02 ENCOUNTER — Ambulatory Visit (INDEPENDENT_AMBULATORY_CARE_PROVIDER_SITE_OTHER): Payer: Medicare Other | Admitting: *Deleted

## 2017-11-02 DIAGNOSIS — I428 Other cardiomyopathies: Secondary | ICD-10-CM | POA: Diagnosis not present

## 2017-11-02 NOTE — Progress Notes (Signed)
Remote ICD transmission.   

## 2017-11-11 MED FILL — DOFETILIDE 250 MCG CAPS: 250 | 30 days supply | Qty: 60 | Fill #5

## 2017-11-18 MED FILL — SPIRONOLACTONE 25 MG TABLET: 25 | 30 days supply | Qty: 30 | Fill #9

## 2017-11-18 MED FILL — LISINOPRIL 40 MG TABS: 40 | 90 days supply | Qty: 90 | Fill #1

## 2017-11-18 MED FILL — INVOKANA 300 MG TABLET: 300 | 30 days supply | Qty: 30 | Fill #9

## 2017-11-22 ENCOUNTER — Telehealth: Payer: Self-pay

## 2017-11-22 NOTE — Telephone Encounter (Signed)
LVM for pt to call back in regards to an apt with Dr. Graciela Husbands on 8/25 @ 2:45pm to check Bi-Vp, pt also needs a chest x ray prior to OV and EKG at this OV, spot being held for pt.

## 2017-11-24 ENCOUNTER — Telehealth: Payer: Self-pay | Admitting: *Deleted

## 2017-11-24 DIAGNOSIS — Z95810 Presence of automatic (implantable) cardiac defibrillator: Secondary | ICD-10-CM

## 2017-11-24 NOTE — Telephone Encounter (Signed)
Pt is returning your phone call  

## 2017-11-24 NOTE — Telephone Encounter (Signed)
LMOM to return call to [facility]. ICD remote monitoring alert for decreased BiV pacing. Per SK- CXR, appt and EKG. Holding 11/30/17 2:45pm.

## 2017-11-24 NOTE — Telephone Encounter (Signed)
Mr. Schneeberger calling back- he was made aware of decrease in BiV pacing and Dr. Odessa Fleming recommendations. He is agreeable to CXR and appointment 11/30/17. Order placed.

## 2017-11-25 ENCOUNTER — Ambulatory Visit (HOSPITAL_COMMUNITY)
Admission: RE | Admit: 2017-11-25 | Discharge: 2017-11-25 | Disposition: A | Discharge status: Home / Self Care | Payer: Medicare Other | Source: Ambulatory Visit | Attending: Internal Medicine | Admitting: Internal Medicine

## 2017-11-25 DIAGNOSIS — Z95810 Presence of automatic (implantable) cardiac defibrillator: Secondary | ICD-10-CM | POA: Diagnosis not present

## 2017-11-30 ENCOUNTER — Encounter (INDEPENDENT_AMBULATORY_CARE_PROVIDER_SITE_OTHER): Payer: Self-pay

## 2017-11-30 ENCOUNTER — Encounter: Payer: Self-pay | Admitting: Internal Medicine

## 2017-11-30 ENCOUNTER — Ambulatory Visit: Payer: Medicare Other | Admitting: Internal Medicine

## 2017-11-30 VITALS — BP 140/72 | HR 79 | Ht 70.0 in | Wt 219.4 lb

## 2017-11-30 DIAGNOSIS — I5022 Chronic systolic (congestive) heart failure: Secondary | ICD-10-CM | POA: Diagnosis not present

## 2017-11-30 DIAGNOSIS — Z95810 Presence of automatic (implantable) cardiac defibrillator: Secondary | ICD-10-CM | POA: Diagnosis not present

## 2017-11-30 DIAGNOSIS — I480 Paroxysmal atrial fibrillation: Secondary | ICD-10-CM | POA: Diagnosis not present

## 2017-11-30 DIAGNOSIS — I428 Other cardiomyopathies: Secondary | ICD-10-CM

## 2017-11-30 LAB — CUP PACEART INCLINIC DEVICE CHECK
Date Time Interrogation Session: 20190828040000
Implantable Lead Implant Date: 20111201
Implantable Lead Location: 753859
Implantable Lead Model: 4396
Implantable Lead Model: 5076
Implantable Lead Serial Number: 302703
Implantable Pulse Generator Implant Date: 20160817
Lead Channel Setting Pacing Amplitude: 2 V
Lead Channel Setting Pacing Amplitude: 2.4 V
Lead Channel Setting Pacing Pulse Width: 1.5 ms
Lead Channel Setting Sensing Sensitivity: 0.5 mV
MDC IDC LEAD IMPLANT DT: 20111201
MDC IDC LEAD IMPLANT DT: 20111201
MDC IDC LEAD LOCATION: 753858
MDC IDC LEAD LOCATION: 753860
MDC IDC SET LEADCHNL LV PACING AMPLITUDE: 2.2 V
MDC IDC SET LEADCHNL LV SENSING SENSITIVITY: 1 mV
MDC IDC SET LEADCHNL RV PACING PULSEWIDTH: 0.4 ms
MDC IDC STAT BRADY RA PERCENT PACED: 88 %
MDC IDC STAT BRADY RV PERCENT PACED: 84 %
Pulse Gen Serial Number: 113381

## 2017-11-30 NOTE — Patient Instructions (Addendum)
Medication Instructions:  Your physician recommends that you continue on your current medications as directed. Please refer to the Current Medication list given to you today.  Labwork: None ordered.  Testing/Procedures: None ordered.  Follow-Up: Follow up with Dr Graciela Husbands as planned in July of 2020.  Remote monitoring is used to monitor your Pacemaker of ICD from home. This monitoring reduces the number of office visits required to check your device to one time per year. It allows [place] to keep an eye on the functioning of your device to ensure it is working properly. You are scheduled for a device check from home in 2 months. You may send your transmission at any time that day. If you have a wireless device, the transmission will be sent automatically. After your physician reviews your transmission, you will receive a postcard with your next transmission date.    Any Other Special Instructions Will Be Listed Below (If Applicable).     If you need a refill on your cardiac medications before your next appointment, please call your pharmacy.

## 2017-11-30 NOTE — Progress Notes (Addendum)
Patient Care Team: [name], Adelfa Koh, MD as PCP - General   HPI  [name] is a 77 y.o. male Seen in followup for CRT-D implantation in the fall 2011. He underwent device replacement 8/16 He also had a history of atrial flutter.   In 2009 he underwent  porcine aortic valve replacement with 25 mm Carpentier-Edwards valve, replacement of ascending aortic aneurysm with 38-mm Hemishield graft, as well as Cox maze procedure for atrial fibrillation.  Nonischemic cardiomyopathy  He takes dofetilide; previously anticoagulation but now on ASA  TERF include age, CAD DM CHF for CHADSVASc> 5      DATE TEST EF   4/09 LHC  20 %   8//14 Echo   50 %   12/16 Echo  60-65% Mean A Grad 12   Aortic arch was reviewed by CT scanning 2/19 4.1 cm  Date Cr K Hgb  2/19 1.14 5.0>>5.1 14.9 (4/19)     The patient denies chest pain, shortness of breath, nocturnal dyspnea, orthopnea or peripheral edema.  There have been no palpitations, lightheadedness or syncope.   He is seen because of concerns over sensing from his LV lead decreased by V pacing  Past Medical History:  Diagnosis Date  . Aortic stenosis/ bicuspid valve--s/p AVR   . Ascending aortic aneurysm (HCC)   . Atrial fibrillation/flutter   . Biventricular implantable cardiac defibrillator- BSX   . Cholelithiases   . Diabetes mellitus without complication (HCC)   . DJD (degenerative joint disease)   . Hypertension   . LBBB (left bundle branch block)   . Non-ischemic cardiomyopathy (HCC)   . OSA (obstructive sleep apnea)     Past Surgical History:  Procedure Laterality Date  . AORTIC VALVE REPLACEMENT    . APPENDECTOMY    . CERVICAL DISCECTOMY  5/01   C7  . CHOLECYSTECTOMY    . EP IMPLANTABLE DEVICE N/A 11/20/2014   Procedure:  ICD Generator Changeout;  Surgeon: Duke Salvia, MD;  Location: [facility] INVASIVE CV LAB;  Service: Cardiovascular;  Laterality: N/A;  . MEDIAN STERNOTOMY    . TONSILLECTOMY      Current Outpatient  Medications  Medication Sig Dispense Refill  . amoxicillin (AMOXIL) 500 MG capsule TAKE 4 CAPSULES (2,000 MG TOTAL) BY MOUTH ONCE AS NEEDED (FOR DENTAL PROCEDURES). 4 capsule 3  . aspirin EC 81 MG tablet Take 81 mg by mouth daily.    . carvedilol (COREG) 12.5 MG tablet Take 1 tablet (12.5 mg total) by mouth 2 (two) times daily with a meal. 180 tablet 3  . dofetilide (TIKOSYN) 250 MCG capsule Take 1 capsule (250 mcg total) by mouth 2 (two) times daily. 60 capsule 11  . furosemide (LASIX) 40 MG tablet Take 40 mg by mouth daily as needed (swelling).     . INVOKANA 300 MG TABS tablet Take 300 mg by mouth daily.  12  . lisinopril (PRINIVIL,ZESTRIL) 40 MG tablet Take 40 mg by mouth daily.    . metFORMIN (GLUCOPHAGE) 1000 MG tablet Take 1,000 mg by mouth daily with breakfast.   4  . spironolactone (ALDACTONE) 25 MG tablet TAKE 1 TABLET (25 MG) BY MOUTH DAILY. 30 tablet 11   No current facility-administered medications for this visit.     No Known Allergies  Review of Systems negative except from HPI and PMH  Physical Exam BP 140/72   Pulse 79   Ht 5\' 10"  (1.778 m)   Wt 219 lb 6.4 oz (99.5 kg)  SpO2 94%   BMI 31.48 kg/m  Well developed and nourished in no acute distress HENT normal Neck supple   Clear Regular rate and rhythm, no murmurs or gallops Abd-soft   No edema Skin-warm and dry A & Oriented  Grossly normal sensory and motor function    ECG demonstrates AV pacing upright QRS V1 and neg 1 QTc 544 ( QRSd 178)   Assessment and  Plan  Nonischemic cardiomyopathy- resolved  Aortic stenosis S./P. bioprosthetic replacement  Implantable defibrillator-CRT-[employer]  The patient's device was interrogated and the information was fully reviewed.  The device was reprogrammed to shorten  AV delay to increase BiV pacing percentage  Congestive heart.-Chronic mixed   PVCs     Atrial fibrillation-dofetilide  Chronotropic incompetence  Hyperkalemia   We attempted to very  sensitivity of his atrial lead to promote tracking.  We were unsuccessful.  Given his paucity of symptoms we elected to leave him where he was.

## 2017-12-01 ENCOUNTER — Other Ambulatory Visit: Payer: Self-pay | Admitting: Internal Medicine

## 2017-12-07 ENCOUNTER — Ambulatory Visit (INDEPENDENT_AMBULATORY_CARE_PROVIDER_SITE_OTHER): Payer: Medicare Other | Admitting: *Deleted

## 2017-12-07 DIAGNOSIS — I428 Other cardiomyopathies: Secondary | ICD-10-CM

## 2017-12-07 NOTE — Progress Notes (Signed)
Remote ICD transmission.   

## 2017-12-08 NOTE — Addendum Note (Signed)
Addended by: Nigel Sloop on: 12/08/2017 08:56 AM   Modules accepted: Level of Service

## 2017-12-14 LAB — CUP PACEART REMOTE DEVICE CHECK
Battery Remaining Percentage: 100 %
Brady Statistic RA Percent Paced: 86 %
Date Time Interrogation Session: 20190731091600
HighPow Impedance: 54 Ohm
Implantable Lead Implant Date: 20111201
Implantable Lead Implant Date: 20111201
Implantable Lead Implant Date: 20111201
Implantable Lead Location: 753859
Implantable Lead Location: 753860
Implantable Lead Model: 158
Implantable Pulse Generator Implant Date: 20160817
Lead Channel Impedance Value: 561 Ohm
Lead Channel Impedance Value: 810 Ohm
Lead Channel Pacing Threshold Amplitude: 0.7 V
Lead Channel Pacing Threshold Amplitude: 0.8 V
Lead Channel Pacing Threshold Pulse Width: 0.4 ms
Lead Channel Setting Pacing Amplitude: 2.2 V
Lead Channel Setting Pacing Pulse Width: 0.4 ms
Lead Channel Setting Pacing Pulse Width: 1.5 ms
Lead Channel Setting Sensing Sensitivity: 1 mV
MDC IDC LEAD LOCATION: 753858
MDC IDC LEAD SERIAL: 302703
MDC IDC MSMT BATTERY REMAINING LONGEVITY: 90 mo
MDC IDC MSMT LEADCHNL LV IMPEDANCE VALUE: 1113 Ohm
MDC IDC MSMT LEADCHNL LV PACING THRESHOLD AMPLITUDE: 1.6 V
MDC IDC MSMT LEADCHNL LV PACING THRESHOLD PULSEWIDTH: 1.5 ms
MDC IDC MSMT LEADCHNL RA PACING THRESHOLD PULSEWIDTH: 0.4 ms
MDC IDC SET LEADCHNL RA PACING AMPLITUDE: 2 V
MDC IDC SET LEADCHNL RV PACING AMPLITUDE: 2.4 V
MDC IDC SET LEADCHNL RV SENSING SENSITIVITY: 0.5 mV
MDC IDC STAT BRADY RV PERCENT PACED: 82 %
Pulse Gen Serial Number: 113381

## 2017-12-15 MED FILL — INVOKANA 300 MG TABLET: 300 | 30 days supply | Qty: 30 | Fill #10

## 2017-12-16 ENCOUNTER — Telehealth: Payer: Self-pay | Admitting: *Deleted

## 2017-12-16 NOTE — Telephone Encounter (Signed)
Spoke with patient's wife, [name] ([place]). [name] that Dr. Graciela Husbands reviewed remote transmission from 12/07/17 and advised no additional changes at this time. Patient's next remote transmission is scheduled for 02/01/18. [name] took down DC phone number in case patient wants to call back, but she denies questions or concerns at this time.

## 2017-12-21 MED FILL — SPIRONOLACTONE 25 MG TABLET: 25 | 30 days supply | Qty: 30 | Fill #10

## 2017-12-27 LAB — CUP PACEART REMOTE DEVICE CHECK
Date Time Interrogation Session: 20190924104216
Implantable Lead Implant Date: 20111201
Implantable Lead Implant Date: 20111201
Implantable Lead Implant Date: 20111201
Implantable Lead Location: 753860
Implantable Pulse Generator Implant Date: 20160817
MDC IDC LEAD LOCATION: 753858
MDC IDC LEAD LOCATION: 753859
MDC IDC LEAD SERIAL: 302703
Pulse Gen Serial Number: 113381

## 2018-01-05 MED FILL — CARVEDILOL 12.5 MG TABLET: 12.5 | 90 days supply | Qty: 180 | Fill #2

## 2018-01-05 MED FILL — metFORMIN HCL 1000 MG TABS: 1000 | 90 days supply | Qty: 180 | Fill #1

## 2018-01-05 MED FILL — DOFETILIDE 250 MCG CAPS: 250 | 30 days supply | Qty: 60 | Fill #6

## 2018-01-12 ENCOUNTER — Other Ambulatory Visit: Payer: Self-pay | Admitting: Cardiology

## 2018-01-12 MED FILL — FUROSEMIDE 40 MG TAB: 40 | 30 days supply | Qty: 45 | Fill #0

## 2018-01-12 MED FILL — INVOKANA 300 MG TABLET: 300 | 30 days supply | Qty: 30 | Fill #11

## 2018-01-15 ENCOUNTER — Emergency Department (INDEPENDENT_AMBULATORY_CARE_PROVIDER_SITE_OTHER)
Admission: EM | Admit: 2018-01-15 | Discharge: 2018-01-15 | Disposition: A | Discharge status: Home / Self Care | Payer: Medicare Other | Source: Home / Self Care | Attending: Emergency Medicine | Admitting: Emergency Medicine

## 2018-01-15 ENCOUNTER — Encounter: Payer: Self-pay | Admitting: Emergency Medicine

## 2018-01-15 ENCOUNTER — Other Ambulatory Visit: Payer: Self-pay

## 2018-01-15 DIAGNOSIS — L03116 Cellulitis of left lower limb: Secondary | ICD-10-CM

## 2018-01-15 MED ORDER — CEPHALEXIN 500 MG PO CAPS: 500.0000 mg | ORAL_CAPSULE | Freq: Three times a day (TID) | ORAL | 0 refills | Status: DC

## 2018-01-15 NOTE — Discharge Instructions (Signed)
Clean area with soap and water twice a day. Take antibiotics 3 times a day. Call tomorrow and check on the status of your tetanus immunizations. If you develop significant fever or shaking chills least go to the emergency room.

## 2018-01-15 NOTE — ED Provider Notes (Signed)
[facility]    CSN: [id number] Arrival date & time: 01/15/18  1218     History   Chief Complaint Chief Complaint  Patient presents with  . Wound Infection    HPI [name] is a 77 y.o. male.  Patient was in his usual state of health until Friday when he sustained a scratch from his dog. Yesterday he noticed some developing pain and redness which has become increasingly severe involving the left shin area. He has had no fever or chills. He is a diabetic and has been under reasonable control.He states his immunizations are up-to-date but he will jump check tomorrow with his PCP to be sure. HPI  Past Medical History:  Diagnosis Date  . Aortic stenosis/ bicuspid valve--s/p AVR   . Ascending aortic aneurysm (HCC)   . Atrial fibrillation/flutter   . Biventricular implantable cardiac defibrillator- BSX   . Cholelithiases   . Diabetes mellitus without complication (HCC)   . DJD (degenerative joint disease)   . Hypertension   . LBBB (left bundle branch block)   . Non-ischemic cardiomyopathy (HCC)   . OSA (obstructive sleep apnea)     Patient Active Problem List   Diagnosis Date Noted  . Abdominal aortic aneurysm (HCC) 02/20/2014  . Encounter for therapeutic drug monitoring 08/03/2013  . Bruit 02/14/2013  . Hypertension   . Nonischemic cardiomyopathy (HCC) 02/03/2011  . Biventricular implantable cardioverter-defibrillator in situ 08/04/2010  . Atrial fibrillation (HCC) 06/09/2010  . SYSTOLIC HEART FAILURE, CHRONIC 01/26/2010  . ESSENTIAL HYPERTENSION, BENIGN 05/07/2009  . AORTIC VALVE REPLACEMENT, HX OF 05/07/2009  . SLEEP APNEA, OBSTRUCTIVE 05/06/2009  . AORTIC STENOSIS 05/06/2009  . LEFT BUNDLE BRANCH BLOCK 05/06/2009  . 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/06/2009  . CHOLELITHIASIS 05/06/2009  . DEGENERATIVE JOINT DISEASE 05/06/2009  . 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  . SHORTNESS OF BREATH 05/06/2009    Past Surgical History:  Procedure Laterality  Date  . AORTIC VALVE REPLACEMENT    . APPENDECTOMY    . CERVICAL DISCECTOMY  5/01   C7  . CHOLECYSTECTOMY    . EP IMPLANTABLE DEVICE N/A 11/20/2014   Procedure:  ICD Generator Changeout;  Surgeon: Duke Salvia, MD;  Location: [facility] INVASIVE CV LAB;  Service: Cardiovascular;  Laterality: N/A;  . MEDIAN STERNOTOMY    . TONSILLECTOMY         Home Medications    Prior to Admission medications   Medication Sig Start Date End Date Taking? Authorizing Provider  amoxicillin (AMOXIL) 500 MG capsule TAKE 4 CAPSULES (2,000 MG TOTAL) BY MOUTH ONCE AS NEEDED (FOR DENTAL PROCEDURES). 01/05/16   Duke Salvia, MD  aspirin EC 81 MG tablet Take 81 mg by mouth daily.    [provider]  carvedilol (COREG) 12.5 MG tablet Take 1 tablet (12.5 mg total) by mouth 2 (two) times daily with a meal. 05/26/17   Crenshaw, Madolyn Frieze, MD  cephALEXin (KEFLEX) 500 MG capsule Take 1 capsule (500 mg total) by mouth 3 (three) times daily. 01/15/18   Collene Gobble, MD  dofetilide (TIKOSYN) 250 MCG capsule Take 1 capsule (250 mcg total) by mouth 2 (two) times daily. 01/13/17   Marily Lente, NP  furosemide (LASIX) 40 MG tablet Take 40 mg by mouth daily as needed (swelling).  01/28/16   [provider]  furosemide (LASIX) 40 MG tablet TAKE 1 TABLET BY MOUTH DAILY AND EXTRA DOSE IF NEEDED 01/12/18   Lewayne Bunting, MD  INVOKANA 300 MG  TABS tablet Take 300 mg by mouth daily. 03/05/15   [provider]  lisinopril (PRINIVIL,ZESTRIL) 40 MG tablet Take 40 mg by mouth daily.    [provider]  metFORMIN (GLUCOPHAGE) 1000 MG tablet Take 1,000 mg by mouth daily with breakfast.  05/30/15   [provider]  spironolactone (ALDACTONE) 25 MG tablet TAKE 1 TABLET (25 MG) BY MOUTH DAILY. 02/28/17   Lewayne Bunting, MD    Family History Family History  Problem Relation Age of Onset  . Emphysema Father   . Leukemia Mother   . Hypertension Maternal Grandmother   . Stroke Maternal  Grandmother   . Congestive Heart Failure Sister   . Diabetes Brother   . Alzheimer's disease Brother     Social History Social History   Tobacco Use  . Smoking status: Former Games developer  . Smokeless tobacco: Never Used  Substance Use Topics  . Alcohol use: No  . Drug use: No     Allergies   Patient has no known allergies.   Review of Systems Review of Systems  Constitutional: Negative for fever.  Musculoskeletal:       He has pain and swelling over the left lower shin area which is now red.     Physical Exam Triage Vital Signs ED Triage Vitals  Enc Vitals Group     BP 01/15/18 1251 122/71     Pulse Rate 01/15/18 1251 75     Resp 01/15/18 1251 18     Temp 01/15/18 1251 97.8 F (36.6 C)     Temp Source 01/15/18 1251 Oral     SpO2 01/15/18 1251 96 %     Weight 01/15/18 1252 210 lb (95.3 kg)     Height 01/15/18 1252 5\' 10"  (1.778 m)     Head Circumference --      Peak Flow --      Pain Score 01/15/18 1251 1     Pain Loc --      Pain Edu? --      Excl. in GC? --    No data found.  Updated Vital Signs BP 122/71 (BP Location: Right Arm)   Pulse 75   Temp 97.8 F (36.6 C) (Oral)   Resp 18   Ht 5\' 10"  (1.778 m)   Wt 95.3 kg   SpO2 96%   BMI 30.13 kg/m   Visual Acuity Right Eye Distance:   Left Eye Distance:   Bilateral Distance:    Right Eye Near:   Left Eye Near:    Bilateral Near:     Physical Exam  Constitutional: He appears well-developed and well-nourished.  Pulmonary/Chest: Effort normal and breath sounds normal.  Skin:  There is a half centimeter open ulcer lower left anterior shin. There are stasis changes. Dorsalis pedis pulse is 2+. There is a 3 x 4" irregular area of redness and tenderness over the lower anterior shin.     UC Treatments / Results  Labs (all labs ordered are listed, but only abnormal results are displayed) Labs Reviewed - No data to display  EKG None  Radiology No results found.  Procedures Procedures  (including critical care time)  Medications Ordered in UC Medications - No data to display  Initial Impression / Assessment and Plan / UC Course  I have reviewed the triage vital signs and the nursing notes.  Pertinent labs & imaging results that were available during my care of the patient were reviewed by me and considered in my  medical decision making (see chart for details). Patient has a cellulitis of the left lower extremity. There is no drainage or area to culture. There was a scratch-like area but no purulence around the area. Will treat with cephalexin 3 times a day. He was advised to present to the emergency room if he develops significant fever or chills. He understands the red flags for this type of infection. He will follow-up with his regular doctor in the next few days.     Final Clinical Impressions(s) / UC Diagnoses   Final diagnoses:  Cellulitis of left lower extremity     Discharge Instructions     Clean area with soap and water twice a day. Take antibiotics 3 times a day. Call tomorrow and check on the status of your tetanus immunizations. If you develop significant fever or shaking chills least go to the emergency room.    ED Prescriptions    Medication Sig Dispense Auth. Provider   cephALEXin (KEFLEX) 500 MG capsule  (Status: Discontinued) Take 1 capsule (500 mg total) by mouth 3 (three) times daily. 30 capsule Collene Gobble, MD   cephALEXin (KEFLEX) 500 MG capsule Take 1 capsule (500 mg total) by mouth 3 (three) times daily. 30 capsule Collene Gobble, MD     Controlled Substance Prescriptions [place] Controlled Substance Registry consulted? Not Applicable   Collene Gobble, MD 01/15/18 [phone]

## 2018-01-15 NOTE — ED Triage Notes (Signed)
Patient had been scratched by his dog on lower left leg recently; 2 days ago he noticed redness spreading around the area; is concerned about infection. No OTCs today.

## 2018-01-18 MED FILL — MUPIROCIN 2% OINTMENT: 2 | 10 days supply | Qty: 22 | Fill #0

## 2018-01-20 MED FILL — SPIRONOLACTONE 25 MG TABLET: 25 | 30 days supply | Qty: 30 | Fill #11

## 2018-01-25 MED FILL — DOXYCYCLINE HYCLATE 100 MG: 100 | 7 days supply | Qty: 14 | Fill #0

## 2018-02-01 ENCOUNTER — Ambulatory Visit (INDEPENDENT_AMBULATORY_CARE_PROVIDER_SITE_OTHER): Payer: Medicare Other | Admitting: *Deleted

## 2018-02-01 DIAGNOSIS — I428 Other cardiomyopathies: Secondary | ICD-10-CM

## 2018-02-01 DIAGNOSIS — I5022 Chronic systolic (congestive) heart failure: Secondary | ICD-10-CM

## 2018-02-01 NOTE — Progress Notes (Signed)
Remote ICD transmission.   

## 2018-02-10 ENCOUNTER — Encounter: Payer: Self-pay | Admitting: Cardiology

## 2018-02-20 ENCOUNTER — Other Ambulatory Visit: Payer: Self-pay | Admitting: Cardiology

## 2018-02-20 MED FILL — SPIRONOLACTONE 25 MG TABLET: 25 | 30 days supply | Qty: 30 | Fill #0

## 2018-02-20 MED FILL — LISINOPRIL 40 MG TABLET: 40 | 90 days supply | Qty: 90 | Fill #2

## 2018-02-20 MED FILL — INVOKANA 300 MG TABLET: 300 | 30 days supply | Qty: 30 | Fill #0

## 2018-03-13 ENCOUNTER — Other Ambulatory Visit: Payer: Self-pay | Admitting: Nurse Practitioner

## 2018-03-13 MED FILL — DOFETILIDE 250 MCG CAPS: 250 | 30 days supply | Qty: 60 | Fill #0

## 2018-03-15 MED FILL — SPIRONOLACTONE 25 MG TABLET: 25 | 30 days supply | Qty: 30 | Fill #1

## 2018-03-15 MED FILL — INVOKANA 300 MG TABLET: 300 | 30 days supply | Qty: 30 | Fill #1

## 2018-03-16 MED FILL — MUPIROCIN 2% OINTMENT: 2 | 10 days supply | Qty: 22 | Fill #0

## 2018-03-16 MED FILL — DOXYCYCLINE HYCLATE 100 MG: 100 | 7 days supply | Qty: 14 | Fill #0

## 2018-04-04 LAB — CUP PACEART REMOTE DEVICE CHECK
Date Time Interrogation Session: 20191231093958
Implantable Lead Implant Date: 20111201
Implantable Lead Implant Date: 20111201
Implantable Lead Location: 753860
Implantable Lead Model: 158
Implantable Lead Model: 5076
Implantable Lead Serial Number: 302703
Implantable Pulse Generator Implant Date: 20160817
MDC IDC LEAD IMPLANT DT: 20111201
MDC IDC LEAD LOCATION: 753858
MDC IDC LEAD LOCATION: 753859
Pulse Gen Serial Number: 113381

## 2018-04-06 MED FILL — CARVEDILOL 12.5 MG TABLET: 12.5 | 90 days supply | Qty: 180 | Fill #3

## 2018-04-12 MED FILL — ONETOUCH VERIO TEST STRIP: 90 days supply | Qty: 100 | Fill #2

## 2018-04-13 MED FILL — SPIRONOLACTONE 25 MG TABLET: 25 | 30 days supply | Qty: 30 | Fill #2

## 2018-04-13 MED FILL — INVOKANA 300 MG TABLET: 300 | 30 days supply | Qty: 30 | Fill #2

## 2018-05-03 ENCOUNTER — Ambulatory Visit (INDEPENDENT_AMBULATORY_CARE_PROVIDER_SITE_OTHER): Payer: Medicare Other

## 2018-05-03 DIAGNOSIS — I5022 Chronic systolic (congestive) heart failure: Secondary | ICD-10-CM

## 2018-05-03 DIAGNOSIS — I428 Other cardiomyopathies: Secondary | ICD-10-CM | POA: Diagnosis not present

## 2018-05-04 NOTE — Progress Notes (Signed)
Remote ICD transmission.   

## 2018-05-05 LAB — CUP PACEART REMOTE DEVICE CHECK
Brady Statistic RV Percent Paced: 97 %
Date Time Interrogation Session: 20200129091100
HIGH POWER IMPEDANCE MEASURED VALUE: 51 Ohm
Implantable Lead Implant Date: 20111201
Implantable Lead Implant Date: 20111201
Implantable Lead Location: 753858
Implantable Lead Location: 753859
Implantable Lead Model: 5076
Implantable Lead Serial Number: 302703
Implantable Pulse Generator Implant Date: 20160817
Lead Channel Impedance Value: 1083 Ohm
Lead Channel Impedance Value: 583 Ohm
Lead Channel Pacing Threshold Amplitude: 0.7 V
Lead Channel Pacing Threshold Amplitude: 0.8 V
Lead Channel Pacing Threshold Pulse Width: 0.4 ms
Lead Channel Setting Pacing Amplitude: 2 V
Lead Channel Setting Pacing Amplitude: 2.4 V
Lead Channel Setting Pacing Pulse Width: 1.5 ms
MDC IDC LEAD IMPLANT DT: 20111201
MDC IDC LEAD LOCATION: 753860
MDC IDC MSMT BATTERY REMAINING LONGEVITY: 78 mo
MDC IDC MSMT BATTERY REMAINING PERCENTAGE: 100 %
MDC IDC MSMT LEADCHNL LV PACING THRESHOLD AMPLITUDE: 2.8 V
MDC IDC MSMT LEADCHNL LV PACING THRESHOLD PULSEWIDTH: 1.5 ms
MDC IDC MSMT LEADCHNL RV IMPEDANCE VALUE: 713 Ohm
MDC IDC MSMT LEADCHNL RV PACING THRESHOLD PULSEWIDTH: 0.4 ms
MDC IDC SET LEADCHNL LV PACING AMPLITUDE: 2.2 V
MDC IDC SET LEADCHNL LV SENSING SENSITIVITY: 1 mV
MDC IDC SET LEADCHNL RV PACING PULSEWIDTH: 0.4 ms
MDC IDC SET LEADCHNL RV SENSING SENSITIVITY: 0.5 mV
MDC IDC STAT BRADY RA PERCENT PACED: 95 %
Pulse Gen Serial Number: 113381

## 2018-05-15 MED FILL — DOFETILIDE 250 MCG CAPS: 250 | 30 days supply | Qty: 60 | Fill #1

## 2018-05-15 MED FILL — INVOKANA 300 MG TABLET: 300 | 30 days supply | Qty: 30 | Fill #3

## 2018-05-15 MED FILL — SPIRONOLACTONE 25 MG TABLET: 25 | 30 days supply | Qty: 30 | Fill #3

## 2018-05-17 NOTE — Progress Notes (Signed)
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 Note a cardiac catheterization in April of 2009 prior to his procedure showed an ejection fraction of 20%, no coronary disease, a dilated aortic root, moderate aortic stenosis and mild mitral regurgitation. The patient subsequently had aortic valve replacement with 25 mm Carpentier-Edwards valve, replacement of ascending aortic aneurysm with 38-mm Hemishield graft, modified Cox maze III. Note he did develop atrial fibrillation 6 months after his procedure. A cardioversion was attempted by his report but he only held sinus for one week. Patient had CRT-D on December 2 of 2011. He was placed on tikosyn for his atrial fibrillation/flutter. His dose was decreased because of increasing QT. Last echocardiogram December 2016 showed normal LV function, bioprosthetic aortic valve with mean gradient 12 mmHg, aortic root 38 mm.  CTA February 2019 showed ascending aorta at 4 cm and aortic arch 4.1 cm.  Since I last saw him,  patient denies dyspnea, chest pain, palpitations or syncope.  Current Outpatient Medications  Medication Sig Dispense Refill  . amoxicillin (AMOXIL) 500 MG capsule TAKE 4 CAPSULES (2,000 MG TOTAL) BY MOUTH ONCE AS NEEDED (FOR DENTAL PROCEDURES). 4 capsule 3  . aspirin EC 81 MG tablet Take 81 mg by mouth daily.    . carvedilol (COREG) 12.5 MG tablet Take 1 tablet (12.5 mg total) by mouth 2 (two) times daily with a meal. 180 tablet 3  . cephALEXin (KEFLEX) 500 MG capsule Take 1 capsule (500 mg total) by mouth 3 (three) times daily. 30 capsule 0  . dofetilide (TIKOSYN) 250 MCG capsule TAKE 1 CAPSULE BY MOUTH TWICE DAILY 60 capsule 8  . furosemide (LASIX) 40 MG tablet TAKE 1 TABLET BY MOUTH DAILY AND EXTRA DOSE IF NEEDED 45 tablet 11  . INVOKANA 300 MG TABS tablet Take 300 mg by mouth daily.  12  . lisinopril (PRINIVIL,ZESTRIL) 40 MG  tablet Take 40 mg by mouth daily.    . metFORMIN (GLUCOPHAGE) 1000 MG tablet Take 1,000 mg by mouth daily with breakfast.   4  . spironolactone (ALDACTONE) 25 MG tablet TAKE 1 TABLET (25 MG) BY MOUTH DAILY. 30 tablet 11   No current facility-administered medications for this visit.      Past Medical History:  Diagnosis Date  . Aortic stenosis/ bicuspid valve--s/p AVR   . Ascending aortic aneurysm (HCC)   . Atrial fibrillation/flutter   . Biventricular implantable cardiac defibrillator- BSX   . Cholelithiases   . Diabetes mellitus without complication (HCC)   . DJD (degenerative joint disease)   . Hypertension   . LBBB (left bundle branch block)   . Non-ischemic cardiomyopathy (HCC)   . OSA (obstructive sleep apnea)     Past Surgical History:  Procedure Laterality Date  . AORTIC VALVE REPLACEMENT    . APPENDECTOMY    . CERVICAL DISCECTOMY  5/01   C7  . CHOLECYSTECTOMY    . EP IMPLANTABLE DEVICE N/A 11/20/2014   Procedure:  ICD Generator Changeout;  Surgeon: Duke Salvia, MD;  [facility] INVASIVE CV LAB;  Service: Cardiovascular;  Laterality: N/A;  . MEDIAN STERNOTOMY    . TONSILLECTOMY      Social History   Socioeconomic History  . Marital status: Married    Spouse name: Not on file  . Number of children: 2  . Years of education: Not on file  . Highest education level:  Not on file  Occupational History  . Occupation: retired Paediatric nurse  . Financial resource strain: Not on file  . Food insecurity:    Worry: Not on file    Inability: Not on file  . Transportation needs:    Medical: Not on file    Non-medical: Not on file  Tobacco Use  . Smoking status: Former Games developer  . Smokeless tobacco: Never Used  Substance and Sexual Activity  . Alcohol use: No  . Drug use: No  . Sexual activity: Not on file  Lifestyle  . Physical activity:    Days per week: Not on file    Minutes per session: Not on file  . Stress: Not on file  Relationships  . Social  connections:    Talks on phone: Not on file    Gets together: Not on file    Attends religious service: Not on file    Active member of club or organization: Not on file    Attends meetings of clubs or organizations: Not on file    Relationship status: Not on file  . Intimate partner violence:    Fear of current or ex partner: Not on file    Emotionally abused: Not on file    Physically abused: Not on file    Forced sexual activity: Not on file  Other Topics Concern  . Not on file  Social History Narrative  . Not on file    Family History  Problem Relation Age of Onset  . Emphysema Father   . Leukemia Mother   . Hypertension Maternal Grandmother   . Stroke Maternal Grandmother   . Congestive Heart Failure Sister   . Diabetes Brother   . Alzheimer's disease Brother     ROS: no fevers or chills, productive cough, hemoptysis, dysphasia, odynophagia, melena, hematochezia, dysuria, hematuria, rash, seizure activity, orthopnea, PND, pedal edema, claudication. Remaining systems are negative.  Physical Exam: Well-developed well-nourished in no acute distress.  Skin is warm and dry.  HEENT is normal.  Neck is supple.  Chest is clear to auscultation with normal expansion.  Cardiovascular exam is regular rate and rhythm.  2/6 systolic murmur left sternal border. Abdominal exam nontender or distended. No masses palpated. Extremities show trace edema. neuro grossly intact  ECG-ventricular paced rhythm.  Personally reviewed  A/P  1 paroxysmal atrial fibrillation-Vpaced today. Plan to continue present dose of Tikosyn.  Continue beta-blocker for rate control if atrial fibrillation recurs.  He has declined anticoagulation and understands the higher risk of CVA.  Check potassium, renal function and magnesium.  2 prior aortic valve replacement-continue SBE prophylaxis.  3 nonischemic cardiomyopathy-patient's most recent echocardiogram showed improvement in LV function.  Continue  present dose of beta-blocker and ACE inhibitor.  4 hypertension-patient's blood pressure is controlled.  Continue present medications and follow.  5 history of thoracic aortic aneurysm-plan repeat CTA to further assess.  6 prior ICD-followed by Dr. Graciela Husbands.  Olga Millers, MD

## 2018-05-22 MED FILL — LISINOPRIL 40 MG TABLET: 40 | 90 days supply | Qty: 90 | Fill #3

## 2018-05-31 ENCOUNTER — Encounter: Payer: Self-pay | Admitting: Cardiology

## 2018-05-31 ENCOUNTER — Ambulatory Visit: Payer: Medicare Other | Admitting: Cardiology

## 2018-05-31 VITALS — BP 124/72 | HR 75 | Ht 70.0 in | Wt 219.8 lb

## 2018-05-31 DIAGNOSIS — I712 Thoracic aortic aneurysm, without rupture, unspecified: Secondary | ICD-10-CM

## 2018-05-31 DIAGNOSIS — I428 Other cardiomyopathies: Secondary | ICD-10-CM | POA: Diagnosis not present

## 2018-05-31 DIAGNOSIS — I480 Paroxysmal atrial fibrillation: Secondary | ICD-10-CM | POA: Diagnosis not present

## 2018-05-31 NOTE — Patient Instructions (Signed)
Medication Instructions:   NO CHANGE  Labwork:  Your physician recommends that you HAVE LAB WORK TODAY  Testing/Procedures:  CTA OF THE CHEST TO FOLLOW UP ON THORACIC ANEURYSM=[place] [facility]  Follow-Up:  Your physician wants you to follow-up in: 1 YEAR WITH DR Jens Som You will receive a reminder letter in the mail two months in advance. If you don't receive a letter, please call our office to schedule the follow-up appointment.  CALL IN December TO SCHEDULE APPOINTMENT IN February 2021

## 2018-06-01 ENCOUNTER — Encounter: Payer: Self-pay | Admitting: *Deleted

## 2018-06-01 LAB — MAGNESIUM: Magnesium: 2.2 mg/dL (ref 1.6–2.3)

## 2018-06-01 LAB — BASIC METABOLIC PANEL
BUN/Creatinine Ratio: 22 (ref 10–24)
BUN: 24 mg/dL (ref 8–27)
CALCIUM: 9.3 mg/dL (ref 8.6–10.2)
CO2: 22 mmol/L (ref 20–29)
Chloride: 104 mmol/L (ref 96–106)
Creatinine, Ser: 1.08 mg/dL (ref 0.76–1.27)
GFR, EST AFRICAN AMERICAN: 76 mL/min/{1.73_m2} (ref >59)
GFR, EST NON AFRICAN AMERICAN: 65 mL/min/{1.73_m2} (ref >59)
Glucose: 215 mg/dL — ABNORMAL HIGH (ref 65–99)
POTASSIUM: 4.5 mmol/L (ref 3.5–5.2)
Sodium: 143 mmol/L (ref 134–144)

## 2018-06-06 ENCOUNTER — Ambulatory Visit (HOSPITAL_BASED_OUTPATIENT_CLINIC_OR_DEPARTMENT_OTHER): Payer: Medicare Other

## 2018-06-07 ENCOUNTER — Encounter (HOSPITAL_BASED_OUTPATIENT_CLINIC_OR_DEPARTMENT_OTHER): Payer: Self-pay

## 2018-06-07 ENCOUNTER — Ambulatory Visit (HOSPITAL_BASED_OUTPATIENT_CLINIC_OR_DEPARTMENT_OTHER)
Admission: RE | Admit: 2018-06-07 | Discharge: 2018-06-07 | Disposition: A | Discharge status: Home / Self Care | Payer: Medicare Other | Source: Ambulatory Visit | Attending: Cardiology | Admitting: Cardiology

## 2018-06-07 DIAGNOSIS — I712 Thoracic aortic aneurysm, without rupture, unspecified: Secondary | ICD-10-CM

## 2018-06-07 MED ORDER — IOPAMIDOL (ISOVUE-370) INJECTION 76%
100.0000 mL | Freq: Once | INTRAVENOUS | Status: AC | PRN
  Administered 2018-06-07: 100 mL via INTRAVENOUS

## 2018-06-14 MED FILL — SPIRONOLACTONE 25 MG TABLET: 25 | 30 days supply | Qty: 30 | Fill #4

## 2018-06-14 MED FILL — INVOKANA 300 MG TABLET: 300 | 30 days supply | Qty: 30 | Fill #4

## 2018-07-06 ENCOUNTER — Other Ambulatory Visit: Payer: Self-pay | Admitting: Cardiology

## 2018-07-06 MED FILL — DOFETILIDE 250 MCG CAPS: 250 | 30 days supply | Qty: 60 | Fill #2

## 2018-07-06 MED FILL — CARVEDILOL 12.5 MG TABLET: 12.5 | 90 days supply | Qty: 180 | Fill #0

## 2018-07-14 MED FILL — SPIRONOLACTONE 25 MG TABLET: 25 | 30 days supply | Qty: 30 | Fill #5

## 2018-07-14 MED FILL — INVOKANA 300 MG TABLET: 300 | 30 days supply | Qty: 30 | Fill #5

## 2018-07-20 MED FILL — SULFAMETHOXAZOLE-TMP DS TAB: 800-160 | 10 days supply | Qty: 20 | Fill #0

## 2018-07-24 MED FILL — DOXYCYCLINE HYCLATE 100 MG: 100 | 7 days supply | Qty: 14 | Fill #0

## 2018-07-24 MED FILL — MUPIROCIN 2% OINTMENT: 2 | 10 days supply | Qty: 22 | Fill #0

## 2018-08-02 ENCOUNTER — Other Ambulatory Visit: Payer: Self-pay

## 2018-08-02 ENCOUNTER — Ambulatory Visit (INDEPENDENT_AMBULATORY_CARE_PROVIDER_SITE_OTHER): Payer: Medicare Other | Admitting: *Deleted

## 2018-08-02 DIAGNOSIS — I428 Other cardiomyopathies: Secondary | ICD-10-CM

## 2018-08-02 DIAGNOSIS — I5022 Chronic systolic (congestive) heart failure: Secondary | ICD-10-CM

## 2018-08-02 LAB — CUP PACEART REMOTE DEVICE CHECK
Battery Remaining Longevity: 78 mo
Battery Remaining Percentage: 100 %
Brady Statistic RA Percent Paced: 95 %
Brady Statistic RV Percent Paced: 97 %
Date Time Interrogation Session: 20200429081100
HighPow Impedance: 51 Ohm
Implantable Lead Implant Date: 20111201
Implantable Lead Implant Date: 20111201
Implantable Lead Implant Date: 20111201
Implantable Lead Location: 753858
Implantable Lead Location: 753859
Implantable Lead Location: 753860
Implantable Lead Model: 158
Implantable Lead Model: 4396
Implantable Lead Model: 5076
Implantable Lead Serial Number: 302703
Implantable Pulse Generator Implant Date: 20160817
Lead Channel Impedance Value: 1085 Ohm
Lead Channel Impedance Value: 613 Ohm
Lead Channel Impedance Value: 725 Ohm
Lead Channel Pacing Threshold Amplitude: 0.7 V
Lead Channel Pacing Threshold Amplitude: 0.8 V
Lead Channel Pacing Threshold Amplitude: 2.8 V
Lead Channel Pacing Threshold Pulse Width: 0.4 ms
Lead Channel Pacing Threshold Pulse Width: 0.4 ms
Lead Channel Pacing Threshold Pulse Width: 1.5 ms
Lead Channel Setting Pacing Amplitude: 2 V
Lead Channel Setting Pacing Amplitude: 2.2 V
Lead Channel Setting Pacing Amplitude: 2.4 V
Lead Channel Setting Pacing Pulse Width: 0.4 ms
Lead Channel Setting Pacing Pulse Width: 1.5 ms
Lead Channel Setting Sensing Sensitivity: 0.5 mV
Lead Channel Setting Sensing Sensitivity: 1 mV
Pulse Gen Serial Number: 113381

## 2018-08-11 MED FILL — SPIRONOLACTONE 25 MG TABLET: 25 | 30 days supply | Qty: 30 | Fill #6

## 2018-08-11 MED FILL — INVOKANA 300 MG TABLET: 300 | 30 days supply | Qty: 30 | Fill #0

## 2018-08-11 NOTE — Progress Notes (Signed)
Remote ICD transmission.   

## 2018-08-18 MED FILL — LISINOPRIL 40 MG TABLET: 40 | 90 days supply | Qty: 90 | Fill #0

## 2018-09-18 MED FILL — DOFETILIDE 250 MCG CAPS: 250 | 30 days supply | Qty: 60 | Fill #3

## 2018-09-18 MED FILL — SPIRONOLACTONE 25 MG TABLET: 25 | 30 days supply | Qty: 30 | Fill #7

## 2018-09-18 MED FILL — INVOKANA 300 MG TABLET: 300 | 30 days supply | Qty: 30 | Fill #1

## 2018-10-02 MED FILL — metFORMIN HCL 1000 MG TABS: 1000 | 90 days supply | Qty: 180 | Fill #0

## 2018-10-10 MED FILL — INVOKANA 300 MG TABLET: 300 | 30 days supply | Qty: 30 | Fill #2

## 2018-10-10 MED FILL — SPIRONOLACTONE 25 MG TABLET: 25 | 30 days supply | Qty: 30 | Fill #8

## 2018-10-17 MED FILL — SPIRONOLACTONE 25 MG TABLET: 25 | 30 days supply | Qty: 30 | Fill #8

## 2018-10-17 MED FILL — INVOKANA 300 MG TABLET: 300 | 30 days supply | Qty: 30 | Fill #2

## 2018-10-18 MED FILL — CARVEDILOL 12.5 MG TABLET: 12.5 | 90 days supply | Qty: 180 | Fill #1

## 2018-11-01 ENCOUNTER — Ambulatory Visit (INDEPENDENT_AMBULATORY_CARE_PROVIDER_SITE_OTHER): Payer: Medicare Other | Admitting: *Deleted

## 2018-11-01 DIAGNOSIS — I5022 Chronic systolic (congestive) heart failure: Secondary | ICD-10-CM

## 2018-11-01 DIAGNOSIS — I428 Other cardiomyopathies: Secondary | ICD-10-CM | POA: Diagnosis not present

## 2018-11-01 LAB — CUP PACEART REMOTE DEVICE CHECK
Battery Remaining Longevity: 78 mo
Battery Remaining Percentage: 100 %
Brady Statistic RA Percent Paced: 93 %
Brady Statistic RV Percent Paced: 96 %
Date Time Interrogation Session: 20200729085000
HighPow Impedance: 54 Ohm
Implantable Lead Implant Date: 20111201
Implantable Lead Implant Date: 20111201
Implantable Lead Implant Date: 20111201
Implantable Lead Location: 753858
Implantable Lead Location: 753859
Implantable Lead Location: 753860
Implantable Lead Model: 158
Implantable Lead Model: 4396
Implantable Lead Model: 5076
Implantable Lead Serial Number: 302703
Implantable Pulse Generator Implant Date: 20160817
Lead Channel Impedance Value: 1154 Ohm
Lead Channel Impedance Value: 633 Ohm
Lead Channel Impedance Value: 755 Ohm
Lead Channel Pacing Threshold Amplitude: 0.7 V
Lead Channel Pacing Threshold Amplitude: 0.8 V
Lead Channel Pacing Threshold Amplitude: 2.8 V
Lead Channel Pacing Threshold Pulse Width: 0.4 ms
Lead Channel Pacing Threshold Pulse Width: 0.4 ms
Lead Channel Pacing Threshold Pulse Width: 1.5 ms
Lead Channel Setting Pacing Amplitude: 2 V
Lead Channel Setting Pacing Amplitude: 2.2 V
Lead Channel Setting Pacing Amplitude: 2.4 V
Lead Channel Setting Pacing Pulse Width: 0.4 ms
Lead Channel Setting Pacing Pulse Width: 1.5 ms
Lead Channel Setting Sensing Sensitivity: 0.5 mV
Lead Channel Setting Sensing Sensitivity: 1 mV
Pulse Gen Serial Number: 113381

## 2018-11-10 NOTE — Progress Notes (Signed)
Remote ICD transmission.   

## 2018-11-15 MED FILL — ONETOUCH VERIO TEST STRIP: 90 days supply | Qty: 100 | Fill #0

## 2018-11-15 MED FILL — SPIRONOLACTONE 25 MG TABLET: 25 | 30 days supply | Qty: 30 | Fill #9

## 2018-11-15 MED FILL — DOFETILIDE 250 MCG CAPS: 250 | 30 days supply | Qty: 60 | Fill #4

## 2018-11-15 MED FILL — INVOKANA 300 MG TABLET: 300 | 30 days supply | Qty: 30 | Fill #3

## 2018-11-22 MED FILL — LISINOPRIL 40 MG TABS: 40 | 90 days supply | Qty: 90 | Fill #1

## 2018-12-15 MED FILL — INVOKANA 300 MG TABLET: 300 | 30 days supply | Qty: 30 | Fill #4

## 2018-12-15 MED FILL — SPIRONOLACTONE 25 MG TABLET: 25 | 30 days supply | Qty: 30 | Fill #10

## 2019-01-03 ENCOUNTER — Ambulatory Visit (INDEPENDENT_AMBULATORY_CARE_PROVIDER_SITE_OTHER): Payer: Medicare Other | Admitting: Internal Medicine

## 2019-01-03 ENCOUNTER — Encounter: Payer: Self-pay | Admitting: Internal Medicine

## 2019-01-03 ENCOUNTER — Other Ambulatory Visit: Payer: Self-pay

## 2019-01-03 VITALS — BP 130/80 | HR 75 | Ht 70.0 in | Wt 216.6 lb

## 2019-01-03 DIAGNOSIS — Z79899 Other long term (current) drug therapy: Secondary | ICD-10-CM

## 2019-01-03 DIAGNOSIS — I5022 Chronic systolic (congestive) heart failure: Secondary | ICD-10-CM | POA: Diagnosis not present

## 2019-01-03 DIAGNOSIS — I480 Paroxysmal atrial fibrillation: Secondary | ICD-10-CM

## 2019-01-03 DIAGNOSIS — Z95810 Presence of automatic (implantable) cardiac defibrillator: Secondary | ICD-10-CM | POA: Diagnosis not present

## 2019-01-03 DIAGNOSIS — I359 Nonrheumatic aortic valve disorder, unspecified: Secondary | ICD-10-CM

## 2019-01-03 DIAGNOSIS — I428 Other cardiomyopathies: Secondary | ICD-10-CM

## 2019-01-03 DIAGNOSIS — Z952 Presence of prosthetic heart valve: Secondary | ICD-10-CM

## 2019-01-03 DIAGNOSIS — I10 Essential (primary) hypertension: Secondary | ICD-10-CM

## 2019-01-03 NOTE — Patient Instructions (Signed)
Medication Instructions:  Your physician recommends that you continue on your current medications as directed. Please refer to the Current Medication list given to you today.  *If you need a refill on your cardiac medications before your next appointment, please call your pharmacy*  Labwork: Today: BMET, CBC & Magnesium level If you have labs (blood work) drawn today and your tests are completely normal, you will receive your results only by:  [place] (if you have MyChart) OR  A paper copy in the mail If you have any lab test that is abnormal or we need to change your treatment, we will call you to review the results.  Testing/Procedures: None ordered  Follow-Up: Remote monitoring is used to monitor your Pacemaker or ICD from home. This monitoring reduces the number of office visits required to check your device to one time per year. It allows [place] to keep an eye on the functioning of your device to ensure it is working properly. You are scheduled for a device check from home on 02/01/2019. You may send your transmission at any time that day. If you have a wireless device, the transmission will be sent automatically. After your physician reviews your transmission, you will receive a postcard with your next transmission date.  Your physician wants you to follow-up in: 6 months with Dr. Caryl Comes.  You will receive a reminder letter in the mail two months in advance. If you don't receive a letter, please call our office to schedule the follow-up appointment.  Thank you for choosing [facility]!!     Any Other Special Instructions Will Be Listed Below (If Applicable).

## 2019-01-03 NOTE — Progress Notes (Signed)
Patient Care Team: [name], [name], MD as PCP - General   HPI  [name] is a 78 y.o. male Seen in followup for CRT-D implantation in the fall 2011. He underwent device replacement 8/16 He also had a history of atrial flutter.   In 2009 he underwent  porcine aortic valve replacement with 25 mm Carpentier-Edwards valve, replacement of ascending aortic aneurysm with 38-mm Hemishield graft, as well as Cox maze procedure for atrial fibrillation.  Nonischemic cardiomyopathy  He takes dofetilide; previously anticoagulation but now on ASA  TERF include age, CAD DM CHF for CHADSVASc> 5      DATE TEST EF   4/09 LHC  20 %   8//14 Echo   50 %   12/16 Echo  60-65% Mean A Grad 12   Aortic arch was reviewed by CT scanning 2/19 4.1 cm  Date Cr K Mg Hgb  2/19 1.14 5.0>>5.1  14.9 (4/19)  2/20 1.08 4.5  15.6       He is seen because of concerns over sensing from his LV lead decreased by V pacing  This was addressed last time by decreasing AV delay>>with increasing BiVpacing 84>>96.   With that he had significant improvement in exercise tolerance.  Less dyspnea.  He denies chest pain.  He has some weeping edema of his left leg.  It was wrapped today.  He has bilateral edema.  No orthopnea.  Past Medical History:  Diagnosis Date  . Aortic stenosis/ bicuspid valve--s/p AVR   . Ascending aortic aneurysm ([place])   . Atrial fibrillation/flutter   . Biventricular implantable cardiac defibrillator- BSX   . Cholelithiases   . Diabetes mellitus without complication ([place])   . DJD (degenerative joint disease)   . Hypertension   . LBBB (left bundle branch block)   . Non-ischemic cardiomyopathy ([place])   . OSA (obstructive sleep apnea)     Past Surgical History:  Procedure Laterality Date  . AORTIC VALVE REPLACEMENT    . APPENDECTOMY    . CERVICAL DISCECTOMY  5/01   C7  . CHOLECYSTECTOMY    . EP IMPLANTABLE DEVICE N/A 11/20/2014   Procedure:  ICD Generator Changeout;  Surgeon:  [name], MD;  Location: [facility] CV LAB;  Service: Cardiovascular;  Laterality: N/A;  . MEDIAN STERNOTOMY    . TONSILLECTOMY      Current Outpatient Medications  Medication Sig Dispense Refill  . amoxicillin (AMOXIL) 500 MG capsule TAKE 4 CAPSULES (2,000 MG TOTAL) BY MOUTH ONCE AS NEEDED (FOR DENTAL PROCEDURES). 4 capsule 3  . aspirin EC 81 MG tablet Take 81 mg by mouth daily.    . carvedilol (COREG) 12.5 MG tablet TAKE 1 TABLET (12.5 MG TOTAL) BY MOUTH 2 (TWO) TIMES DAILY WITH A MEAL. 180 tablet 3  . dofetilide (TIKOSYN) 250 MCG capsule TAKE 1 CAPSULE BY MOUTH TWICE DAILY 60 capsule 8  . furosemide (LASIX) 40 MG tablet TAKE 1 TABLET BY MOUTH DAILY AND EXTRA DOSE IF NEEDED 45 tablet 11  . INVOKANA 300 MG TABS tablet Take 300 mg by mouth daily.  12  . lisinopril (PRINIVIL,ZESTRIL) 40 MG tablet Take 40 mg by mouth daily.    . metFORMIN (GLUCOPHAGE) 1000 MG tablet Take 1,000 mg by mouth daily with breakfast.   4  . spironolactone (ALDACTONE) 25 MG tablet TAKE 1 TABLET (25 MG) BY MOUTH DAILY. 30 tablet 11   No current facility-administered medications for this visit.     No  Known Allergies  Review of Systems negative except from HPI and PMH  Physical Exam BP 130/80   Pulse 75   Ht 5\' 10"  (1.778 m)   Wt 216 lb 9.6 oz (98.2 kg)   SpO2 95%   BMI 31.08 kg/m  Well developed and well nourished in no acute distress HENT normal Neck supple with JVP 6-8 cm with positive HJR clear Device pocket well healed; without hematoma or erythema.  There is no tethering  Regular rate and rhythm, no  gallop No murmur Abd-soft with active BS No Clubbing cyanosis 2+ edema Skin-warm and dry A & Oriented  Grossly normal sensory and motor function  ECG AV pacing with upright QRS lead V1 negative QRS V1 QRS duration 176 ms  Assessment and  Plan  Nonischemic cardiomyopathy- resolved  Aortic stenosis S./P. bioprosthetic replacement  Implantable defibrillator-CRT-[employer]    Reprogrammed his dynamic AV delay to decrease intrinsic conduction.  Congestive heart.-Chronic mixed   PVCs    Atrial fibrillation-  High Risk Medication Surveillance dofetilide  On Anticoagulation;  No bleeding issues   Mildly volume overloaded.  We will increase his furosemide from 40--80 for 3 days out of the next 7.  On dofetilide.  We will need to check potassium, magnesium and renal function.  PVCs quiescent    We spent more than 50% of our >25 min visit in face to face counseling regarding the above

## 2019-01-04 LAB — CBC
Hematocrit: 44.9 % (ref 37.5–51.0)
Hemoglobin: 15.4 g/dL (ref 13.0–17.7)
MCH: 30.3 pg (ref 26.6–33.0)
MCHC: 34.3 g/dL (ref 31.5–35.7)
MCV: 88 fL (ref 79–97)
Platelets: 211 10*3/uL (ref 150–450)
RBC: 5.09 x10E6/uL (ref 4.14–5.80)
RDW: 12.7 % (ref 11.6–15.4)
WBC: 8 10*3/uL (ref 3.4–10.8)

## 2019-01-04 LAB — BASIC METABOLIC PANEL
BUN/Creatinine Ratio: 21 (ref 10–24)
BUN: 22 mg/dL (ref 8–27)
CO2: 23 mmol/L (ref 20–29)
Calcium: 9.5 mg/dL (ref 8.6–10.2)
Chloride: 102 mmol/L (ref 96–106)
Creatinine, Ser: 1.06 mg/dL (ref 0.76–1.27)
GFR calc Af Amer: 77 mL/min/{1.73_m2} (ref >59)
GFR calc non Af Amer: 66 mL/min/{1.73_m2} (ref >59)
Glucose: 131 mg/dL — ABNORMAL HIGH (ref 65–99)
Potassium: 4.3 mmol/L (ref 3.5–5.2)
Sodium: 140 mmol/L (ref 134–144)

## 2019-01-04 LAB — MAGNESIUM: Magnesium: 2.2 mg/dL (ref 1.6–2.3)

## 2019-01-12 MED FILL — INVOKANA 300 MG TABLET: 300 | 30 days supply | Qty: 30 | Fill #5

## 2019-01-12 MED FILL — CARVEDILOL 12.5 MG TABLET: 12.5 | 90 days supply | Qty: 180 | Fill #2

## 2019-01-12 MED FILL — DOFETILIDE 250 MCG CAPS: 250 | 30 days supply | Qty: 60 | Fill #5

## 2019-01-12 MED FILL — SPIRONOLACTONE 25 MG TABLET: 25 | 30 days supply | Qty: 30 | Fill #11

## 2019-01-19 LAB — CUP PACEART INCLINIC DEVICE CHECK
Brady Statistic RA Percent Paced: 93 %
Brady Statistic RV Percent Paced: 96 %
Date Time Interrogation Session: 20201016143109
Implantable Lead Implant Date: 20111201
Implantable Lead Implant Date: 20111201
Implantable Lead Implant Date: 20111201
Implantable Lead Location: 753858
Implantable Lead Location: 753859
Implantable Lead Location: 753860
Implantable Lead Model: 158
Implantable Lead Model: 4396
Implantable Lead Model: 5076
Implantable Lead Serial Number: 302703
Implantable Pulse Generator Implant Date: 20160817
Lead Channel Pacing Threshold Amplitude: 0.7 V
Lead Channel Pacing Threshold Amplitude: 0.8 V
Lead Channel Pacing Threshold Amplitude: 1.8 V
Lead Channel Pacing Threshold Pulse Width: 0.4 ms
Lead Channel Pacing Threshold Pulse Width: 0.4 ms
Lead Channel Pacing Threshold Pulse Width: 1.5 ms
Lead Channel Sensing Intrinsic Amplitude: 1.4 mV
Lead Channel Sensing Intrinsic Amplitude: 24.3 mV
Lead Channel Setting Pacing Amplitude: 2 V
Lead Channel Setting Pacing Amplitude: 2.2 V
Lead Channel Setting Pacing Amplitude: 2.4 V
Lead Channel Setting Pacing Pulse Width: 0.4 ms
Lead Channel Setting Pacing Pulse Width: 1.5 ms
Lead Channel Setting Sensing Sensitivity: 0.5 mV
Lead Channel Setting Sensing Sensitivity: 1 mV
Pulse Gen Serial Number: 113381

## 2019-02-01 ENCOUNTER — Ambulatory Visit (INDEPENDENT_AMBULATORY_CARE_PROVIDER_SITE_OTHER): Payer: Medicare Other | Admitting: *Deleted

## 2019-02-01 DIAGNOSIS — I428 Other cardiomyopathies: Secondary | ICD-10-CM | POA: Diagnosis not present

## 2019-02-01 DIAGNOSIS — I5022 Chronic systolic (congestive) heart failure: Secondary | ICD-10-CM

## 2019-02-01 LAB — CUP PACEART REMOTE DEVICE CHECK
Battery Remaining Longevity: 72 mo
Battery Remaining Percentage: 98 %
Brady Statistic RA Percent Paced: 98 %
Brady Statistic RV Percent Paced: 98 %
Date Time Interrogation Session: 20201029081000
HighPow Impedance: 48 Ohm
Implantable Lead Implant Date: 20111201
Implantable Lead Implant Date: 20111201
Implantable Lead Implant Date: 20111201
Implantable Lead Location: 753858
Implantable Lead Location: 753859
Implantable Lead Location: 753860
Implantable Lead Model: 158
Implantable Lead Model: 4396
Implantable Lead Model: 5076
Implantable Lead Serial Number: 302703
Implantable Pulse Generator Implant Date: 20160817
Lead Channel Impedance Value: 598 Ohm
Lead Channel Impedance Value: 660 Ohm
Lead Channel Impedance Value: 991 Ohm
Lead Channel Pacing Threshold Amplitude: 0.7 V
Lead Channel Pacing Threshold Amplitude: 0.8 V
Lead Channel Pacing Threshold Amplitude: 1.8 V
Lead Channel Pacing Threshold Pulse Width: 0.4 ms
Lead Channel Pacing Threshold Pulse Width: 0.4 ms
Lead Channel Pacing Threshold Pulse Width: 1.5 ms
Lead Channel Setting Pacing Amplitude: 2 V
Lead Channel Setting Pacing Amplitude: 2.2 V
Lead Channel Setting Pacing Amplitude: 2.4 V
Lead Channel Setting Pacing Pulse Width: 0.4 ms
Lead Channel Setting Pacing Pulse Width: 1.5 ms
Lead Channel Setting Sensing Sensitivity: 0.5 mV
Lead Channel Setting Sensing Sensitivity: 1 mV
Pulse Gen Serial Number: 113381

## 2019-02-12 ENCOUNTER — Other Ambulatory Visit: Payer: Self-pay | Admitting: Cardiology

## 2019-02-12 MED FILL — INVOKANA 300 MG TABLET: 300 | 30 days supply | Qty: 30 | Fill #0

## 2019-02-12 MED FILL — LISINOPRIL 40 MG TABLET: 40 | 90 days supply | Qty: 90 | Fill #2

## 2019-02-13 ENCOUNTER — Telehealth: Payer: Self-pay | Admitting: Emergency Medicine

## 2019-02-13 MED FILL — SPIRONOLACTONE 25 MG TABLET: 25 | 30 days supply | Qty: 30 | Fill #0

## 2019-02-13 NOTE — Telephone Encounter (Signed)
Patient reports he is asymptomatic and was unaware that he is in AF. No OAC due to patient choosing to take ASA only. Explained benefits of [place] with AF and  patient declines to resume [place] despite risk of stroke. Will call office if he has SOB, CP, palpitations or edema.

## 2019-02-13 NOTE — Telephone Encounter (Signed)
LM for patient to call office to assess for symptoms r/t current AF. ASA listed , no OAC.

## 2019-02-16 NOTE — Telephone Encounter (Signed)
Noted  

## 2019-02-24 NOTE — Progress Notes (Signed)
Remote ICD transmission.   

## 2019-03-06 MED FILL — metFORMIN HCL 1000 MG TABS: 1000 | 90 days supply | Qty: 180 | Fill #1

## 2019-03-06 MED FILL — DOFETILIDE 250 MCG CAPS: 250 | 30 days supply | Qty: 60 | Fill #6

## 2019-03-13 MED FILL — SPIRONOLACTONE 25 MG TABS: 25 | 30 days supply | Qty: 30 | Fill #1

## 2019-03-13 MED FILL — INVOKANA 300 MG TABLET: 300 | 30 days supply | Qty: 30 | Fill #1

## 2019-04-10 ENCOUNTER — Other Ambulatory Visit: Payer: Self-pay | Admitting: Nurse Practitioner

## 2019-04-11 MED FILL — DOFETILIDE 250 MCG CAPS: 250 | 30 days supply | Qty: 60 | Fill #0

## 2019-04-16 ENCOUNTER — Telehealth: Payer: Self-pay | Admitting: *Deleted

## 2019-04-16 NOTE — Telephone Encounter (Signed)
Latitude alert for CRT pacing <85% received. Overall BiVP 95% since 01/03/19, but decreased to 79% between 1/8-04/14/19. No VT/VF episodes. 2% AT/AF burden, previously reviewed addressed per 02/13/19 phone note.  Presenting rhythm strip suggests atrial undersensing with intermittent functional atrial non-capture, intrinsic conduction, and FFRWs vs junctional beats. Of note, PAV/SAV was shortened to 130ms/110ms at last OV in 12/2018. RA sensitivity is programmed at 0.46mV, RA output at 2.0V @ 0.1ms with last measured threshold 0.8V @ 0.67ms, impedance trend stable. Reviewed with [facility] rep, strip is challenging to interpret, recommended in-clinic assessment with AAI pacing. Pt is not pacemaker-dependent. Next f/u with Dr. Graciela Husbands is due in 07/2019. Routed to Dr. Graciela Husbands for review and recommendations.

## 2019-04-17 MED FILL — SPIRONOLACTONE 25 MG TABS: 25 | 30 days supply | Qty: 30 | Fill #2

## 2019-04-17 MED FILL — CARVEDILOL 12.5 MG TABLET: 12.5 | 90 days supply | Qty: 180 | Fill #3

## 2019-04-17 MED FILL — FARXIGA 10 MG TABLET: 10 | 90 days supply | Qty: 90 | Fill #0

## 2019-04-26 NOTE — Telephone Encounter (Signed)
Reviewed previous notes in [facility]. Per note from 11/30/17, "Presenting rhythm shows undersensing of true P-waves with possible crosstalk (VS detected as AS vs split atrial signal) and resulting inhibition of BiVP. Per Dr. Odessa Fleming discussion with patient, plan to BiVP as much as possible without RA lead revision at this time." RA sensitivity is currently at max (most sensitive) setting of 0.62mV, AP 85% with base rate programmed at 75bpm. Routed to Dr. Graciela Husbands as Lorain Childes.

## 2019-04-26 NOTE — Telephone Encounter (Signed)
thanks

## 2019-05-01 ENCOUNTER — Other Ambulatory Visit: Payer: Self-pay | Admitting: Cardiology

## 2019-05-01 MED FILL — FUROSEMIDE 40 MG TAB: 40 | 15 days supply | Qty: 30 | Fill #0

## 2019-05-03 ENCOUNTER — Ambulatory Visit (INDEPENDENT_AMBULATORY_CARE_PROVIDER_SITE_OTHER): Payer: Medicare Other | Admitting: *Deleted

## 2019-05-03 DIAGNOSIS — I428 Other cardiomyopathies: Secondary | ICD-10-CM

## 2019-05-03 LAB — CUP PACEART REMOTE DEVICE CHECK
Battery Remaining Longevity: 66 mo
Battery Remaining Percentage: 95 %
Brady Statistic RA Percent Paced: 86 %
Brady Statistic RV Percent Paced: 95 %
Date Time Interrogation Session: 20210128042400
HighPow Impedance: 47 Ohm
Implantable Lead Implant Date: 20111201
Implantable Lead Implant Date: 20111201
Implantable Lead Implant Date: 20111201
Implantable Lead Location: 753858
Implantable Lead Location: 753859
Implantable Lead Location: 753860
Implantable Lead Model: 158
Implantable Lead Model: 4396
Implantable Lead Model: 5076
Implantable Lead Serial Number: 302703
Implantable Pulse Generator Implant Date: 20160817
Lead Channel Impedance Value: 1023 Ohm
Lead Channel Impedance Value: 600 Ohm
Lead Channel Impedance Value: 677 Ohm
Lead Channel Pacing Threshold Amplitude: 0.7 V
Lead Channel Pacing Threshold Amplitude: 0.8 V
Lead Channel Pacing Threshold Amplitude: 1.8 V
Lead Channel Pacing Threshold Pulse Width: 0.4 ms
Lead Channel Pacing Threshold Pulse Width: 0.4 ms
Lead Channel Pacing Threshold Pulse Width: 1.5 ms
Lead Channel Setting Pacing Amplitude: 2 V
Lead Channel Setting Pacing Amplitude: 2.2 V
Lead Channel Setting Pacing Amplitude: 2.4 V
Lead Channel Setting Pacing Pulse Width: 0.4 ms
Lead Channel Setting Pacing Pulse Width: 1.5 ms
Lead Channel Setting Sensing Sensitivity: 0.5 mV
Lead Channel Setting Sensing Sensitivity: 1 mV
Pulse Gen Serial Number: 113381

## 2019-05-04 NOTE — Progress Notes (Signed)
ICD Remote  

## 2019-05-14 MED FILL — DOFETILIDE 250 MCG CAPS: 250 | 30 days supply | Qty: 60 | Fill #1

## 2019-05-14 MED FILL — SPIRONOLACTONE 25 MG TABLET: 25 | 30 days supply | Qty: 30 | Fill #3

## 2019-05-14 MED FILL — LISINOPRIL 40 MG TABLET: 40 | 90 days supply | Qty: 90 | Fill #0

## 2019-05-20 ENCOUNTER — Ambulatory Visit: Payer: Medicare Other | Attending: Internal Medicine

## 2019-05-20 DIAGNOSIS — Z23 Encounter for immunization: Secondary | ICD-10-CM | POA: Insufficient documentation

## 2019-05-20 NOTE — Progress Notes (Signed)
   Covid-19 Vaccination Clinic  Name:  LENN VOLKER    MRN: [id number] DOB: 17-Apr-1940  05/20/2019  Mr. Kronberg was observed post Covid-19 immunization for 15 minutes without incidence. He was provided with Vaccine Information Sheet and instruction to access the V-Safe system.   Mr. Strey was instructed to call 911 with any severe reactions post vaccine: Marland Kitchen Difficulty breathing  . Swelling of your face and throat  . A fast heartbeat  . A bad rash all over your body  . Dizziness and weakness    Immunizations Administered    Name Date Dose VIS Date Route   Pfizer COVID-19 Vaccine 05/20/2019 12:52 PM 0.3 mL 03/16/2019 Intramuscular   Manufacturer: [employer], [employer]   Lot: [id number]   NDC: [address]-1

## 2019-05-30 ENCOUNTER — Encounter: Payer: Self-pay | Admitting: Cardiology

## 2019-05-30 ENCOUNTER — Other Ambulatory Visit: Payer: Self-pay

## 2019-05-30 ENCOUNTER — Ambulatory Visit: Payer: Medicare Other | Admitting: Cardiology

## 2019-05-30 VITALS — BP 126/60 | HR 75 | Ht 70.0 in | Wt 215.0 lb

## 2019-05-30 DIAGNOSIS — Z952 Presence of prosthetic heart valve: Secondary | ICD-10-CM | POA: Diagnosis not present

## 2019-05-30 DIAGNOSIS — I712 Thoracic aortic aneurysm, without rupture, unspecified: Secondary | ICD-10-CM

## 2019-05-30 DIAGNOSIS — Z95810 Presence of automatic (implantable) cardiac defibrillator: Secondary | ICD-10-CM

## 2019-05-30 DIAGNOSIS — I480 Paroxysmal atrial fibrillation: Secondary | ICD-10-CM

## 2019-05-30 DIAGNOSIS — I428 Other cardiomyopathies: Secondary | ICD-10-CM

## 2019-05-30 NOTE — Progress Notes (Signed)
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 Note a cardiac catheterization in April of 2009 prior to his procedure showed an ejection fraction of 20%, no coronary disease, a dilated aortic root, moderate aortic stenosis and mild mitral regurgitation. The patient subsequently had aortic valve replacement with 25 mm Carpentier-Edwards valve, replacement of ascending aortic aneurysm with 38-mm [place] graft, modified Cox maze III. Note he did develop atrial fibrillation 6 months after his procedure. A cardioversion was attempted by his report but he only held sinus for one week. Patient had CRT-D on December 2 of 2011. He was placed on tikosyn for his atrial fibrillation/flutter. His dose was decreased because of increasing QT. Last echocardiogram December 2016 showed normal LV function, bioprosthetic aortic valve with mean gradient 12 mmHg, aortic root 38 mm. CTA March 2020 showed ascending thoracic aortic aneurysm measuring 43 mm, emphysema.  Since I last saw him,  Current Outpatient Medications  Medication Sig Dispense Refill  . amoxicillin (AMOXIL) 500 MG capsule TAKE 4 CAPSULES (2,000 MG TOTAL) BY MOUTH ONCE AS NEEDED (FOR DENTAL PROCEDURES). 4 capsule 3  . aspirin EC 81 MG tablet Take 81 mg by mouth daily.    . carvedilol (COREG) 12.5 MG tablet TAKE 1 TABLET (12.5 MG TOTAL) BY MOUTH 2 (TWO) TIMES DAILY WITH A MEAL. 180 tablet 3  . dofetilide (TIKOSYN) 250 MCG capsule TAKE 1 CAPSULE BY MOUTH TWICE DAILY 60 capsule 8  . FARXIGA 10 MG TABS tablet Take 10 mg by mouth daily.    . furosemide (LASIX) 40 MG tablet Take 1 tablet (40 mg total) by mouth daily. TAKE 1 TABLET BY MOUTH DAILY AND EXTRA DOSE IF NEEDED. Please make annual appt with Dr. Stanford Breed for refills. [phone]. 1st attempt. 30 tablet 0  . lisinopril (PRINIVIL,ZESTRIL) 40 MG tablet Take 40 mg by mouth daily.    .  metFORMIN (GLUCOPHAGE) 1000 MG tablet Take 1,000 mg by mouth daily with breakfast.   4  . spironolactone (ALDACTONE) 25 MG tablet TAKE 1 TABLET (25 MG) BY MOUTH DAILY. 30 tablet 5   No current facility-administered medications for this visit.     Past Medical History:  Diagnosis Date  . Aortic stenosis/ bicuspid valve--s/p AVR   . Ascending aortic aneurysm ([place])   . Atrial fibrillation/flutter   . Biventricular implantable cardiac defibrillator- BSX   . Cholelithiases   . Diabetes mellitus without complication ([place])   . DJD (degenerative joint disease)   . Hypertension   . LBBB (left bundle branch block)   . Non-ischemic cardiomyopathy ([place])   . OSA (obstructive sleep apnea)     Past Surgical History:  Procedure Laterality Date  . AORTIC VALVE REPLACEMENT    . APPENDECTOMY    . CERVICAL DISCECTOMY  5/01   C7  . CHOLECYSTECTOMY    . EP IMPLANTABLE DEVICE N/A 11/20/2014   Procedure:  ICD Generator Changeout;  Surgeon: [name], MD;  Location: [facility];  Service: Cardiovascular;  Laterality: N/A;  . MEDIAN STERNOTOMY    . TONSILLECTOMY      Social History   Socioeconomic History  . Marital status: Married    Spouse name: Not on file  . Number of children: 2  . Years of education: Not on file  . Highest education level: Not on file  Occupational History  . Occupation: retired Chief Financial Officer  Tobacco Use  .  Smoking status: Former Games developer  . Smokeless tobacco: Never Used  Substance and Sexual Activity  . Alcohol use: No  . Drug use: No  . Sexual activity: Not on file  Other Topics Concern  . Not on file  Social History Narrative  . Not on file   Social Determinants of Health   Financial Resource Strain:   . Difficulty of Paying Living Expenses: Not on file  Food Insecurity:   . Worried About Programme researcher, broadcasting/film/video in the Last Year: Not on file  . Ran Out of Food in the Last Year: Not on file  Transportation Needs:   . Lack of Transportation (Medical):  Not on file  . Lack of Transportation (Non-Medical): Not on file  Physical Activity:   . Days of Exercise per Week: Not on file  . Minutes of Exercise per Session: Not on file  Stress:   . Feeling of Stress : Not on file  Social Connections:   . Frequency of Communication with Friends and Family: Not on file  . Frequency of Social Gatherings with Friends and Family: Not on file  . Attends Religious Services: Not on file  . Active Member of Clubs or Organizations: Not on file  . Attends Banker Meetings: Not on file  . Marital Status: Not on file  Intimate Partner Violence:   . Fear of Current or Ex-Partner: Not on file  . Emotionally Abused: Not on file  . Physically Abused: Not on file  . Sexually Abused: Not on file    Family History  Problem Relation Age of Onset  . Emphysema Father   . Leukemia Mother   . Hypertension Maternal Grandmother   . Stroke Maternal Grandmother   . Congestive Heart Failure Sister   . Diabetes Brother   . Alzheimer's disease Brother     ROS: no fevers or chills, productive cough, hemoptysis, dysphasia, odynophagia, melena, hematochezia, dysuria, hematuria, rash, seizure activity, orthopnea, PND, pedal edema, claudication. Remaining systems are negative.  Physical Exam: Well-developed well-nourished in no acute distress.  Skin is warm and dry.  HEENT is normal.  Neck is supple.  Chest is clear to auscultation with normal expansion.  Cardiovascular exam is regular rate and rhythm.  Abdominal exam nontender or distended. No masses palpated. Extremities show no edema. neuro grossly intact  ECG-ventricular paced rhythm.  Personally reviewed  A/P  1 paroxysmal atrial fibrillation-plan to continue Tikosyn at present dose.  Continue beta-blocker.  Check potassium, renal function and magnesium.  Patient declines anticoagulation and understands the higher risk of CVA.  2 prior aortic valve replacement-continue SBE prophylaxis.  Repeat  echocardiogram.  3 history of nonischemic cardiomyopathy-LV function improved on most recent echo.  Continue ACE inhibitor and beta-blocker.  Repeat echocardiogram.  4 history of thoracic aortic aneurysm-we will repeat CTA March 2021.  5 hypertension-blood pressure controlled.  Continue present medications.  6 prior ICD-Per Dr. Graciela Husbands.  Olga Millers, MD

## 2019-05-30 NOTE — Patient Instructions (Signed)
Medication Instructions:  NO CHANGE *If you need a refill on your cardiac medications before your next appointment, please call your pharmacy*  Lab Work: Your physician recommends that you HAVE LAB WORK TODAY If you have labs (blood work) drawn today and your tests are completely normal, you will receive your results only by: Marland Kitchen MyChart Message (if you have MyChart) OR . A paper copy in the mail If you have any lab test that is abnormal or we need to change your treatment, we will call you to review the results.  Testing/Procedures: Your physician has requested that you have an echocardiogram. Echocardiography is a painless test that uses sound waves to create images of your heart. It provides your doctor with information about the size and shape of your heart and how well your heart's chambers and valves are working. This procedure takes approximately one hour. There are no restrictions for this procedure.[facility] [facility] FIRST FLOOR IMAGING DEPARTMENT    Follow-Up: At [facility], you and your health needs are our priority.  As part of our continuing mission to provide you with exceptional heart care, we have created designated Provider Care Teams.  These Care Teams include your primary Cardiologist (physician) and Advanced Practice Providers (APPs -  Physician Assistants and Nurse Practitioners) who all work together to provide you with the care you need, when you need it.  Your next appointment:   12 month(s)  The format for your next appointment:   Either In Person or Virtual  Provider:   Olga Millers, MD

## 2019-05-31 LAB — MAGNESIUM: Magnesium: 2 mg/dL (ref 1.6–2.3)

## 2019-05-31 LAB — BASIC METABOLIC PANEL
BUN/Creatinine Ratio: 21 (ref 10–24)
BUN: 28 mg/dL — ABNORMAL HIGH (ref 8–27)
CO2: 23 mmol/L (ref 20–29)
Calcium: 9.5 mg/dL (ref 8.6–10.2)
Chloride: 98 mmol/L (ref 96–106)
Creatinine, Ser: 1.31 mg/dL — ABNORMAL HIGH (ref 0.76–1.27)
GFR calc Af Amer: 59 mL/min/{1.73_m2} — ABNORMAL LOW (ref >59)
GFR calc non Af Amer: 51 mL/min/{1.73_m2} — ABNORMAL LOW (ref >59)
Glucose: 162 mg/dL — ABNORMAL HIGH (ref 65–99)
Potassium: 4.7 mmol/L (ref 3.5–5.2)
Sodium: 138 mmol/L (ref 134–144)

## 2019-06-01 ENCOUNTER — Ambulatory Visit (HOSPITAL_BASED_OUTPATIENT_CLINIC_OR_DEPARTMENT_OTHER)
Admission: RE | Admit: 2019-06-01 | Discharge: 2019-06-01 | Disposition: A | Discharge status: Home / Self Care | Payer: Medicare Other | Source: Ambulatory Visit | Attending: Cardiology | Admitting: Cardiology

## 2019-06-01 DIAGNOSIS — Z952 Presence of prosthetic heart valve: Secondary | ICD-10-CM | POA: Insufficient documentation

## 2019-06-01 NOTE — Progress Notes (Signed)
  Echocardiogram 2D Echocardiogram has been performed.  Sinda Du 06/01/2019, 9:43 AM

## 2019-06-06 ENCOUNTER — Encounter: Payer: Self-pay | Admitting: *Deleted

## 2019-06-06 DIAGNOSIS — I712 Thoracic aortic aneurysm, without rupture, unspecified: Secondary | ICD-10-CM

## 2019-06-11 ENCOUNTER — Telehealth: Payer: Self-pay | Admitting: Cardiology

## 2019-06-11 NOTE — Telephone Encounter (Signed)
Spoke with patient regarding appointment for CTA chest aorta scheduled 06/13/19 at 1:10 pm at [facility] [address]--arrival time is 12:56m---liquids only 4 hour prior to exam.  Patient voiced his understanding.

## 2019-06-11 NOTE — Addendum Note (Signed)
Addended by: Freddi Starr on: 06/11/2019 07:28 AM   Modules accepted: Orders

## 2019-06-12 ENCOUNTER — Ambulatory Visit: Payer: Medicare Other | Attending: Internal Medicine

## 2019-06-12 DIAGNOSIS — Z23 Encounter for immunization: Secondary | ICD-10-CM | POA: Insufficient documentation

## 2019-06-12 MED FILL — DOFETILIDE 250 MCG CAPS: 250 | 30 days supply | Qty: 60 | Fill #2

## 2019-06-12 MED FILL — SPIRONOLACTONE 25 MG TABS: 25 | 30 days supply | Qty: 30 | Fill #4

## 2019-06-12 MED FILL — METFORMIN HCL 1000 MG TABS: 1000 | 90 days supply | Qty: 180 | Fill #0

## 2019-06-12 NOTE — Progress Notes (Signed)
   Covid-19 Vaccination Clinic  Name:  NAZARETH KIRK    MRN: [id number] DOB: Jul 19, 1940  06/12/2019  Mr. Lahaie was observed post Covid-19 immunization for 15 minutes without incident. He was provided with Vaccine Information Sheet and instruction to access the V-Safe system.   Mr. Deboard was instructed to call 911 with any severe reactions post vaccine: Marland Kitchen Difficulty breathing  . Swelling of face and throat  . A fast heartbeat  . A bad rash all over body  . Dizziness and weakness   Immunizations Administered    Name Date Dose VIS Date Route   Pfizer COVID-19 Vaccine 06/12/2019  2:06 PM 0.3 mL 03/16/2019 Intramuscular   Manufacturer: [employer], [employer]   Lot: [id number]   NDC: [address]-4

## 2019-06-13 ENCOUNTER — Ambulatory Visit
Admission: RE | Admit: 2019-06-13 | Discharge: 2019-06-13 | Disposition: A | Discharge status: Home / Self Care | Payer: Medicare Other | Source: Ambulatory Visit | Attending: Cardiology | Admitting: Cardiology

## 2019-06-13 DIAGNOSIS — I712 Thoracic aortic aneurysm, without rupture, unspecified: Secondary | ICD-10-CM

## 2019-06-13 MED ORDER — IOPAMIDOL (ISOVUE-370) INJECTION 76%
75.0000 mL | Freq: Once | INTRAVENOUS | Status: AC | PRN
  Administered 2019-06-13: 75 mL via INTRAVENOUS

## 2019-07-09 ENCOUNTER — Other Ambulatory Visit: Payer: Self-pay | Admitting: Cardiology

## 2019-07-10 MED FILL — FARXIGA 10 MG TABLET: 10 | 90 days supply | Qty: 90 | Fill #1

## 2019-07-10 MED FILL — SPIRONOLACTONE 25 MG TABS: 25 | 30 days supply | Qty: 30 | Fill #5

## 2019-07-10 MED FILL — FUROSEMIDE 40 MG TAB: 40 | 15 days supply | Qty: 30 | Fill #0

## 2019-07-17 MED FILL — DOFETILIDE 250 MCG CAPS: 250 | 30 days supply | Qty: 60 | Fill #3

## 2019-07-25 ENCOUNTER — Other Ambulatory Visit: Payer: Self-pay | Admitting: Cardiology

## 2019-07-25 MED FILL — ONETOUCH VERIO TEST STRIP: 90 days supply | Qty: 100 | Fill #1

## 2019-07-30 ENCOUNTER — Other Ambulatory Visit: Payer: Self-pay | Admitting: Cardiology

## 2019-07-30 MED FILL — CARVEDILOL 12.5 MG TABLET: 12.5 | 90 days supply | Qty: 180 | Fill #0

## 2019-07-31 MED ORDER — CARVEDILOL 12.5 MG PO TABS: ORAL_TABLET | ORAL | 3 refills | Status: DC

## 2019-08-01 LAB — CUP PACEART REMOTE DEVICE CHECK
Battery Remaining Longevity: 66 mo
Battery Remaining Percentage: 88 %
Brady Statistic RA Percent Paced: 89 %
Brady Statistic RV Percent Paced: 96 %
Date Time Interrogation Session: 20210428041100
HighPow Impedance: 46 Ohm
Implantable Lead Implant Date: 20111201
Implantable Lead Implant Date: 20111201
Implantable Lead Implant Date: 20111201
Implantable Lead Location: 753858
Implantable Lead Location: 753859
Implantable Lead Location: 753860
Implantable Lead Model: 158
Implantable Lead Model: 4396
Implantable Lead Model: 5076
Implantable Lead Serial Number: 302703
Implantable Pulse Generator Implant Date: 20160817
Lead Channel Impedance Value: 1020 Ohm
Lead Channel Impedance Value: 586 Ohm
Lead Channel Pacing Threshold Amplitude: 0.7 V
Lead Channel Pacing Threshold Amplitude: 0.8 V
Lead Channel Pacing Threshold Amplitude: 1.8 V
Lead Channel Pacing Threshold Pulse Width: 0.4 ms
Lead Channel Pacing Threshold Pulse Width: 0.4 ms
Lead Channel Pacing Threshold Pulse Width: 1.5 ms
Lead Channel Setting Pacing Amplitude: 2 V
Lead Channel Setting Pacing Amplitude: 2.2 V
Lead Channel Setting Pacing Amplitude: 2.4 V
Lead Channel Setting Pacing Pulse Width: 0.4 ms
Lead Channel Setting Pacing Pulse Width: 1.5 ms
Lead Channel Setting Sensing Sensitivity: 0.5 mV
Lead Channel Setting Sensing Sensitivity: 1 mV
Pulse Gen Serial Number: 113381

## 2019-08-02 ENCOUNTER — Ambulatory Visit (INDEPENDENT_AMBULATORY_CARE_PROVIDER_SITE_OTHER): Payer: Medicare Other | Admitting: *Deleted

## 2019-08-02 DIAGNOSIS — I428 Other cardiomyopathies: Secondary | ICD-10-CM

## 2019-08-02 LAB — CUP PACEART REMOTE DEVICE CHECK
Battery Remaining Longevity: 66 mo
Battery Remaining Percentage: 87 %
Brady Statistic RA Percent Paced: 89 %
Brady Statistic RV Percent Paced: 96 %
Date Time Interrogation Session: 20210429041100
HighPow Impedance: 44 Ohm
Implantable Lead Implant Date: 20111201
Implantable Lead Implant Date: 20111201
Implantable Lead Implant Date: 20111201
Implantable Lead Location: 753858
Implantable Lead Location: 753859
Implantable Lead Location: 753860
Implantable Lead Model: 158
Implantable Lead Model: 4396
Implantable Lead Model: 5076
Implantable Lead Serial Number: 302703
Implantable Pulse Generator Implant Date: 20160817
Lead Channel Impedance Value: 1005 Ohm
Lead Channel Impedance Value: 591 Ohm
Lead Channel Impedance Value: 663 Ohm
Lead Channel Pacing Threshold Amplitude: 0.7 V
Lead Channel Pacing Threshold Amplitude: 0.8 V
Lead Channel Pacing Threshold Amplitude: 1.8 V
Lead Channel Pacing Threshold Pulse Width: 0.4 ms
Lead Channel Pacing Threshold Pulse Width: 0.4 ms
Lead Channel Pacing Threshold Pulse Width: 1.5 ms
Lead Channel Setting Pacing Amplitude: 2 V
Lead Channel Setting Pacing Amplitude: 2.2 V
Lead Channel Setting Pacing Amplitude: 2.4 V
Lead Channel Setting Pacing Pulse Width: 0.4 ms
Lead Channel Setting Pacing Pulse Width: 1.5 ms
Lead Channel Setting Sensing Sensitivity: 0.5 mV
Lead Channel Setting Sensing Sensitivity: 1 mV
Pulse Gen Serial Number: 113381

## 2019-08-03 NOTE — Progress Notes (Signed)
ICD Remote  

## 2019-08-15 ENCOUNTER — Other Ambulatory Visit: Payer: Self-pay | Admitting: Internal Medicine

## 2019-08-15 ENCOUNTER — Other Ambulatory Visit: Payer: Self-pay | Admitting: Cardiology

## 2019-08-22 MED FILL — LISINOPRIL 40 MG TABLET: 40 | 90 days supply | Qty: 90 | Fill #1

## 2019-09-20 ENCOUNTER — Other Ambulatory Visit: Payer: Self-pay | Admitting: Cardiology

## 2019-09-20 MED FILL — FUROSEMIDE 40 MG TAB: 40 | 30 days supply | Qty: 30 | Fill #0

## 2019-09-24 MED FILL — DOFETILIDE 250 MCG CAPS: 250 | 30 days supply | Qty: 60 | Fill #5

## 2019-10-15 MED FILL — FARXIGA 10 MG TABLET: 10 | 30 days supply | Qty: 30 | Fill #0

## 2019-10-29 MED FILL — CARVEDILOL 12.5 MG TABLET: 12.5 | 90 days supply | Qty: 180 | Fill #1

## 2019-10-29 MED FILL — DOFETILIDE 250 MCG CAPS: 250 | 30 days supply | Qty: 60 | Fill #6

## 2019-11-01 ENCOUNTER — Ambulatory Visit (INDEPENDENT_AMBULATORY_CARE_PROVIDER_SITE_OTHER): Payer: Medicare Other | Admitting: *Deleted

## 2019-11-01 DIAGNOSIS — I428 Other cardiomyopathies: Secondary | ICD-10-CM | POA: Diagnosis not present

## 2019-11-01 LAB — CUP PACEART REMOTE DEVICE CHECK
Battery Remaining Longevity: 60 mo
Battery Remaining Percentage: 83 %
Brady Statistic RA Percent Paced: 90 %
Brady Statistic RV Percent Paced: 95 %
Date Time Interrogation Session: 20210729042300
HighPow Impedance: 47 Ohm
Implantable Lead Implant Date: 20111201
Implantable Lead Implant Date: 20111201
Implantable Lead Implant Date: 20111201
Implantable Lead Location: 753858
Implantable Lead Location: 753859
Implantable Lead Location: 753860
Implantable Lead Model: 158
Implantable Lead Model: 4396
Implantable Lead Model: 5076
Implantable Lead Serial Number: 302703
Implantable Pulse Generator Implant Date: 20160817
Lead Channel Impedance Value: 588 Ohm
Lead Channel Impedance Value: 653 Ohm
Lead Channel Impedance Value: 979 Ohm
Lead Channel Pacing Threshold Amplitude: 0.7 V
Lead Channel Pacing Threshold Amplitude: 0.8 V
Lead Channel Pacing Threshold Amplitude: 1.8 V
Lead Channel Pacing Threshold Pulse Width: 0.4 ms
Lead Channel Pacing Threshold Pulse Width: 0.4 ms
Lead Channel Pacing Threshold Pulse Width: 1.5 ms
Lead Channel Setting Pacing Amplitude: 2 V
Lead Channel Setting Pacing Amplitude: 2.2 V
Lead Channel Setting Pacing Amplitude: 2.4 V
Lead Channel Setting Pacing Pulse Width: 0.4 ms
Lead Channel Setting Pacing Pulse Width: 1.5 ms
Lead Channel Setting Sensing Sensitivity: 0.5 mV
Lead Channel Setting Sensing Sensitivity: 1 mV
Pulse Gen Serial Number: 113381

## 2019-11-06 NOTE — Progress Notes (Signed)
Remote ICD transmission.   

## 2019-11-12 MED FILL — SPIRONOLACTONE 25 MG TABS: 25 | 90 days supply | Qty: 90 | Fill #1

## 2019-11-12 MED FILL — FARXIGA 10 MG TABLET: 10 | 30 days supply | Qty: 30 | Fill #1

## 2019-11-26 ENCOUNTER — Other Ambulatory Visit (HOSPITAL_BASED_OUTPATIENT_CLINIC_OR_DEPARTMENT_OTHER): Payer: Self-pay | Admitting: Internal Medicine

## 2019-11-26 MED FILL — LISINOPRIL 40 MG TABS: 40 | 90 days supply | Qty: 90 | Fill #0

## 2019-11-26 MED FILL — DOFETILIDE 250 MCG CAPS: 250 | 30 days supply | Qty: 60 | Fill #7

## 2019-12-11 ENCOUNTER — Other Ambulatory Visit: Payer: Self-pay | Admitting: Cardiology

## 2019-12-12 ENCOUNTER — Other Ambulatory Visit: Payer: Self-pay | Admitting: Cardiology

## 2019-12-12 MED FILL — FUROSEMIDE 40 MG TAB: 40 | 90 days supply | Qty: 90 | Fill #0

## 2019-12-17 MED FILL — FARXIGA 10 MG TABLET: 10 | 30 days supply | Qty: 30 | Fill #2

## 2019-12-17 NOTE — Progress Notes (Signed)
Electrophysiology Office Note Date: 12/18/2019  ID:  [name], DOB [birth year], MRN [id number]  PCP: Gaspar Garbe, MD Primary Cardiologist: No primary care provider on file. Electrophysiologist: Sherryl Manges, MD   CC: Routine ICD follow-up  [name] is a 79 y.o. male seen today for Sherryl Manges, MD for routine electrophysiology followup.  Since last being seen in our clinic the patient reports doing very well. he denies chest pain, palpitations, dyspnea, PND, orthopnea, nausea, vomiting, dizziness, syncope, edema, weight gain, or early satiety. He has not had ICD shocks.   Device History: Biotronik BiV ICD implanted 2011, gen change 11/2014 for chronic systolic CHF History of AAD therapy: Yes; currently on Tikosyn.   Past Medical History:  Diagnosis Date  . Aortic stenosis/ bicuspid valve--s/p AVR   . Ascending aortic aneurysm ([facility])   . Atrial fibrillation/flutter   . Biventricular implantable cardiac defibrillator- BSX   . Cholelithiases   . Diabetes mellitus without complication ([facility])   . DJD (degenerative joint disease)   . Hypertension   . LBBB (left bundle branch block)   . Non-ischemic cardiomyopathy ([facility])   . OSA (obstructive sleep apnea)    Past Surgical History:  Procedure Laterality Date  . AORTIC VALVE REPLACEMENT    . APPENDECTOMY    . CERVICAL DISCECTOMY  5/01   C7  . CHOLECYSTECTOMY    . EP IMPLANTABLE DEVICE N/A 11/20/2014   Procedure:  ICD Generator Changeout;  Surgeon: Duke Salvia, MD;  Location: [facility] INVASIVE [facility];  Service: Cardiovascular;  Laterality: N/A;  . MEDIAN STERNOTOMY    . TONSILLECTOMY      Current Outpatient Medications  Medication Sig Dispense Refill  . amoxicillin (AMOXIL) 500 MG capsule TAKE 4 CAPSULES (2,000 MG TOTAL) BY MOUTH ONCE AS NEEDED (FOR DENTAL PROCEDURES). 4 capsule 3  . aspirin EC 81 MG tablet Take 81 mg by mouth daily.    . carvedilol (COREG) 12.5 MG tablet TAKE 1 TABLET BY MOUTH TWICE DAILY WITH A  MEAL 180 tablet 3  . dofetilide (TIKOSYN) 250 MCG capsule TAKE 1 CAPSULE BY MOUTH TWICE DAILY 60 capsule 8  . FARXIGA 10 MG TABS tablet Take 10 mg by mouth daily.    . furosemide (LASIX) 40 MG tablet TAKE 1 TABLET (40 MG TOTAL) BY MOUTH DAILY. TAKE AN EXTRA DOSE IF NEEDED. PLEASE MAKE ANNUAL APPT WITH DR. CRENSHAW FOR REFILLS 90 tablet 3  . lisinopril (PRINIVIL,ZESTRIL) 40 MG tablet Take 40 mg by mouth daily.    . metFORMIN (GLUCOPHAGE) 1000 MG tablet Take 1,000 mg by mouth daily with breakfast.   4  . spironolactone (ALDACTONE) 25 MG tablet TAKE 1 TABLET BY MOUTH ONCE DAILY 90 tablet 2   No current facility-administered medications for this visit.    Allergies:   Patient has no known allergies.   Social History: Social History   Socioeconomic History  . Marital status: Married    Spouse name: Not on file  . Number of children: 2  . Years of education: Not on file  . Highest education level: Not on file  Occupational History  . Occupation: retired Art gallery manager  Tobacco Use  . Smoking status: Former Games developer  . Smokeless tobacco: Never Used  Vaping Use  . Vaping Use: Never used  Substance and Sexual Activity  . Alcohol use: No  . Drug use: No  . Sexual activity: Not on file  Other Topics Concern  . Not on file  Social History Narrative  .  Not on file   Social Determinants of Health   Financial Resource Strain:   . Difficulty of Paying Living Expenses: Not on file  Food Insecurity:   . Worried About Programme researcher, broadcasting/film/video in the Last Year: Not on file  . Ran Out of Food in the Last Year: Not on file  Transportation Needs:   . Lack of Transportation (Medical): Not on file  . Lack of Transportation (Non-Medical): Not on file  Physical Activity:   . Days of Exercise per Week: Not on file  . Minutes of Exercise per Session: Not on file  Stress:   . Feeling of Stress : Not on file  Social Connections:   . Frequency of Communication with Friends and Family: Not on file  .  Frequency of Social Gatherings with Friends and Family: Not on file  . Attends Religious Services: Not on file  . Active Member of Clubs or Organizations: Not on file  . Attends Banker Meetings: Not on file  . Marital Status: Not on file  Intimate Partner Violence:   . Fear of Current or Ex-Partner: Not on file  . Emotionally Abused: Not on file  . Physically Abused: Not on file  . Sexually Abused: Not on file    Family History: Family History  Problem Relation Age of Onset  . Emphysema Father   . Leukemia Mother   . Hypertension Maternal Grandmother   . Stroke Maternal Grandmother   . Congestive Heart Failure Sister   . Diabetes Brother   . Alzheimer's disease Brother     Review of Systems: All other systems reviewed and are otherwise negative except as noted above.   Physical Exam: Vitals:   12/18/19 1125  BP: 120/80  Pulse: 75  SpO2: 93%  Weight: 214 lb 3.2 oz (97.2 kg)  Height: 5\' 10"  (1.778 m)     GEN- The patient is well appearing, alert and oriented x 3 today.   HEENT: normocephalic, atraumatic; sclera clear, conjunctiva pink; hearing intact; oropharynx clear; neck supple, no JVP Lymph- no cervical lymphadenopathy Lungs- Clear to ausculation bilaterally, normal work of breathing.  No wheezes, rales, rhonchi Heart- Regular rate and rhythm, no murmurs, rubs or gallops, PMI not laterally displaced GI- soft, non-tender, non-distended, bowel sounds present, no hepatosplenomegaly Extremities- no clubbing or cyanosis. No edema; DP/PT/radial pulses 2+ bilaterally MS- no significant deformity or atrophy Skin- warm and dry, no rash or lesion; ICD pocket well healed Psych- euthymic mood, full affect Neuro- strength and sensation are intact  ICD interrogation- reviewed in detail today,  See PACEART report  EKG:  EKG is ordered today. The ekg ordered today shows VP at 75 bpm, personally reviewed. Stable QTc when measured manually and QRS accounted for.    Recent Labs: 01/03/2019: Hemoglobin 15.4; Platelets 211 05/30/2019: BUN 28; Creatinine, Ser 1.31; Magnesium 2.0; Potassium 4.7; Sodium 138   Wt Readings from Last 3 Encounters:  12/18/19 214 lb 3.2 oz (97.2 kg)  05/30/19 215 lb (97.5 kg)  01/03/19 216 lb 9.6 oz (98.2 kg)     Other studies Reviewed: Additional studies/ records that were reviewed today include: Previous EP office notes, Recent echo shows 45-50%   Assessment and Plan:  1.  Chronic systolic dysfunction s/p 01/05/19 CRT-D  euvolemic today Stable on an appropriate medical regimen Normal ICD function See Pace Art report LV threshold slightly increased from previous at 1.9V @ 1.0 ms today. Permanent programming increased to 2.5V to maintain 0.5V safety margin.  2. Aortic stenosis s/p bioprosthetic replacement Stable by recent echo  3. PVCs  4. Atrial fibrillation EKG today shows NSR (VP) Continue tikosyn. QTc stable.  Pt had breakthrough last week for about 9 hours. This in the setting of ETOH use for his birthday. No sustained since. Overall doing well.  Previously on [facility] but now just on ASA. Follow burden.  Current medicines are reviewed at length with the patient today.   The patient does not have concerns regarding his medicines.  The following changes were made today:  none  Labs/ tests ordered today include:  Orders Placed This Encounter  Procedures  . Basic Metabolic Panel (BMET)  . Magnesium  . EKG 12-Lead     Disposition:   Follow up with EP APP  6 months   Signed, Graciella Freer, PA-C  12/18/2019 1:06 PM  [facility] [address] [place] [address] [phone] (office) [phone] (fax)

## 2019-12-18 ENCOUNTER — Ambulatory Visit: Payer: Medicare Other | Admitting: Student

## 2019-12-18 ENCOUNTER — Encounter: Payer: Self-pay | Admitting: Student

## 2019-12-18 ENCOUNTER — Other Ambulatory Visit: Payer: Self-pay

## 2019-12-18 VITALS — BP 120/80 | HR 75 | Ht 70.0 in | Wt 214.2 lb

## 2019-12-18 DIAGNOSIS — Z79899 Other long term (current) drug therapy: Secondary | ICD-10-CM

## 2019-12-18 DIAGNOSIS — Z95810 Presence of automatic (implantable) cardiac defibrillator: Secondary | ICD-10-CM | POA: Diagnosis not present

## 2019-12-18 DIAGNOSIS — I10 Essential (primary) hypertension: Secondary | ICD-10-CM

## 2019-12-18 DIAGNOSIS — I480 Paroxysmal atrial fibrillation: Secondary | ICD-10-CM | POA: Diagnosis not present

## 2019-12-18 LAB — CUP PACEART INCLINIC DEVICE CHECK
Date Time Interrogation Session: 20210914130128
Implantable Lead Implant Date: 20111201
Implantable Lead Implant Date: 20111201
Implantable Lead Implant Date: 20111201
Implantable Lead Location: 753858
Implantable Lead Location: 753859
Implantable Lead Location: 753860
Implantable Lead Model: 158
Implantable Lead Model: 4396
Implantable Lead Model: 5076
Implantable Lead Serial Number: 302703
Implantable Pulse Generator Implant Date: 20160817
Pulse Gen Serial Number: 113381

## 2019-12-18 LAB — BASIC METABOLIC PANEL
BUN/Creatinine Ratio: 22 (ref 10–24)
BUN: 26 mg/dL (ref 8–27)
CO2: 24 mmol/L (ref 20–29)
Calcium: 9.2 mg/dL (ref 8.6–10.2)
Chloride: 104 mmol/L (ref 96–106)
Creatinine, Ser: 1.17 mg/dL (ref 0.76–1.27)
GFR calc Af Amer: 68 mL/min/{1.73_m2} (ref >59)
GFR calc non Af Amer: 59 mL/min/{1.73_m2} — ABNORMAL LOW (ref >59)
Glucose: 127 mg/dL — ABNORMAL HIGH (ref 65–99)
Potassium: 4.7 mmol/L (ref 3.5–5.2)
Sodium: 140 mmol/L (ref 134–144)

## 2019-12-18 LAB — MAGNESIUM: Magnesium: 2 mg/dL (ref 1.6–2.3)

## 2019-12-18 NOTE — Patient Instructions (Addendum)
Medication Instructions:  *If you need a refill on your cardiac medications before your next appointment, please call your pharmacy*  Lab Work: Your physician has recommended that you have lab work today: BMET and Magnesium Level If you have labs (blood work) drawn today and your tests are completely normal, you will receive your results only by: Marland Kitchen MyChart Message (if you have MyChart) OR . A paper copy in the mail If you have any lab test that is abnormal or we need to change your treatment, we will call you to review the results.  Follow-Up: At [facility], you and your health needs are our priority.  As part of our continuing mission to provide you with exceptional heart care, we have created designated Provider Care Teams.  These Care Teams include your primary Cardiologist (physician) and Advanced Practice Providers (APPs -  Physician Assistants and Nurse Practitioners) who all work together to provide you with the care you need, when you need it.  We recommend signing up for the patient portal called "MyChart".  Sign up information is provided on this After Visit Summary.  MyChart is used to connect with patients for Virtual Visits (Telemedicine).  Patients are able to view lab/test results, encounter notes, upcoming appointments, etc.  Non-urgent messages can be sent to your provider as well.   To learn more about what you can do with MyChart, go to [email or web address].    Your next appointment:   Your physician wants you to follow-up in: 6 MONTHS with Brandon Saber, PA-C. You will receive a reminder letter in the mail two months in advance. If you don't receive a letter, please call our office to schedule the follow-up appointment.  Remote monitoring is used to monitor your ICD from home. This monitoring reduces the number of office visits required to check your device to one time per year. It allows [place] to keep an eye on the functioning of your device to ensure it is working  properly. You are scheduled for a device check from home on 01/31/20. You may send your transmission at any time that day. If you have a wireless device, the transmission will be sent automatically. After your physician reviews your transmission, you will receive a postcard with your next transmission date.  The format for your next appointment:   In Person with Brandon Needle "Mardelle Matte" Lanna Poche, PA-C

## 2019-12-31 ENCOUNTER — Other Ambulatory Visit (HOSPITAL_BASED_OUTPATIENT_CLINIC_OR_DEPARTMENT_OTHER): Payer: Self-pay | Admitting: Internal Medicine

## 2019-12-31 MED FILL — METFORMIN HCL 1000 MG TABS: 1000 | 90 days supply | Qty: 180 | Fill #0

## 2019-12-31 MED FILL — DOFETILIDE 250 MCG CAPS: 250 | 30 days supply | Qty: 60 | Fill #8

## 2020-01-14 MED FILL — FARXIGA 10 MG TABLET: 10 | 30 days supply | Qty: 30 | Fill #3

## 2020-01-15 ENCOUNTER — Other Ambulatory Visit (HOSPITAL_BASED_OUTPATIENT_CLINIC_OR_DEPARTMENT_OTHER): Payer: Self-pay | Admitting: Internal Medicine

## 2020-01-15 ENCOUNTER — Ambulatory Visit: Payer: Medicare Other | Attending: Internal Medicine

## 2020-01-15 DIAGNOSIS — Z23 Encounter for immunization: Secondary | ICD-10-CM

## 2020-01-15 MED FILL — FLUAD QUADRIVALENT 0.5 ML P: 0.5 | 1 days supply | Qty: 1 | Fill #0

## 2020-01-15 NOTE — Progress Notes (Signed)
   Covid-19 Vaccination Clinic  Name:  CYREE CHUONG    MRN: [id number] DOB: 09/18/1940  01/15/2020  Mr. Boxley was observed post Covid-19 immunization for 15 minutes without incident. He was provided with Vaccine Information Sheet and instruction to access the V-Safe system. Vaccinated by Evert Kohl.  Mr. Wrinkle was instructed to call 911 with any severe reactions post vaccine: Marland Kitchen Difficulty breathing  . Swelling of face and throat  . A fast heartbeat  . A bad rash all over body  . Dizziness and weakness

## 2020-01-19 ENCOUNTER — Other Ambulatory Visit (HOSPITAL_BASED_OUTPATIENT_CLINIC_OR_DEPARTMENT_OTHER): Payer: Self-pay | Admitting: Physician Assistant

## 2020-01-21 MED FILL — ALBUTEROL SULFATE HFA 108 (: 108 (90 BAS | 25 days supply | Qty: 9 | Fill #0

## 2020-01-21 MED FILL — AZITHROMYCIN 250 MG TABLET: 250 | 5 days supply | Qty: 6 | Fill #0

## 2020-01-23 MED FILL — PFIZER-BIONTECH COVID-19 VA: 30 | 1 days supply | Qty: 0 | Fill #0

## 2020-01-31 ENCOUNTER — Ambulatory Visit (INDEPENDENT_AMBULATORY_CARE_PROVIDER_SITE_OTHER): Payer: Medicare Other

## 2020-01-31 DIAGNOSIS — I428 Other cardiomyopathies: Secondary | ICD-10-CM

## 2020-01-31 LAB — CUP PACEART REMOTE DEVICE CHECK
Battery Remaining Longevity: 60 mo
Battery Remaining Percentage: 81 %
Brady Statistic RA Percent Paced: 90 %
Brady Statistic RV Percent Paced: 92 %
Date Time Interrogation Session: 20211028045000
HighPow Impedance: 52 Ohm
Implantable Lead Implant Date: 20111201
Implantable Lead Implant Date: 20111201
Implantable Lead Implant Date: 20111201
Implantable Lead Location: 753858
Implantable Lead Location: 753859
Implantable Lead Location: 753860
Implantable Lead Model: 158
Implantable Lead Model: 4396
Implantable Lead Model: 5076
Implantable Lead Serial Number: 302703
Implantable Pulse Generator Implant Date: 20160817
Lead Channel Impedance Value: 1036 Ohm
Lead Channel Impedance Value: 604 Ohm
Lead Channel Impedance Value: 699 Ohm
Lead Channel Pacing Threshold Amplitude: 0.7 V
Lead Channel Pacing Threshold Amplitude: 0.9 V
Lead Channel Pacing Threshold Amplitude: 1.9 V
Lead Channel Pacing Threshold Pulse Width: 0.4 ms
Lead Channel Pacing Threshold Pulse Width: 0.4 ms
Lead Channel Pacing Threshold Pulse Width: 1.5 ms
Lead Channel Setting Pacing Amplitude: 2 V
Lead Channel Setting Pacing Amplitude: 2.4 V
Lead Channel Setting Pacing Amplitude: 2.4 V
Lead Channel Setting Pacing Pulse Width: 0.4 ms
Lead Channel Setting Pacing Pulse Width: 1.5 ms
Lead Channel Setting Sensing Sensitivity: 0.5 mV
Lead Channel Setting Sensing Sensitivity: 1 mV
Pulse Gen Serial Number: 113381

## 2020-02-04 ENCOUNTER — Other Ambulatory Visit: Payer: Self-pay | Admitting: Nurse Practitioner

## 2020-02-04 MED FILL — CARVEDILOL 12.5 MG TABLET: 12.5 | 90 days supply | Qty: 180 | Fill #2

## 2020-02-05 ENCOUNTER — Other Ambulatory Visit: Payer: Self-pay | Admitting: Internal Medicine

## 2020-02-05 MED FILL — DOFETILIDE 250 MCG CAPS: 250 | 90 days supply | Qty: 180 | Fill #0

## 2020-02-05 NOTE — Progress Notes (Signed)
Remote ICD transmission.   

## 2020-02-11 ENCOUNTER — Other Ambulatory Visit (HOSPITAL_BASED_OUTPATIENT_CLINIC_OR_DEPARTMENT_OTHER): Payer: Self-pay | Admitting: Internal Medicine

## 2020-02-11 MED FILL — SPIRONOLACTONE 25 MG TABS: 25 | 90 days supply | Qty: 90 | Fill #2

## 2020-02-11 MED FILL — ALLOPURINOL 300 MG TABLET: 300 | 90 days supply | Qty: 90 | Fill #0

## 2020-02-11 MED FILL — FARXIGA 10 MG TABLET: 10 | 30 days supply | Qty: 30 | Fill #4

## 2020-02-25 MED FILL — LISINOPRIL 40 MG TABS: 40 | 90 days supply | Qty: 90 | Fill #1

## 2020-03-10 ENCOUNTER — Other Ambulatory Visit (HOSPITAL_BASED_OUTPATIENT_CLINIC_OR_DEPARTMENT_OTHER): Payer: Self-pay | Admitting: Internal Medicine

## 2020-03-10 MED FILL — SHINGRIX 50 MCG SUS: 50 | 1 days supply | Qty: 1 | Fill #0

## 2020-03-10 MED FILL — FARXIGA 10 MG TABLET: 10 | 30 days supply | Qty: 30 | Fill #5

## 2020-04-07 MED FILL — METFORMIN HCL 1000 MG TABS: 1000 | 90 days supply | Qty: 180 | Fill #1

## 2020-04-14 ENCOUNTER — Other Ambulatory Visit (HOSPITAL_BASED_OUTPATIENT_CLINIC_OR_DEPARTMENT_OTHER): Payer: Self-pay | Admitting: Internal Medicine

## 2020-04-14 MED FILL — FARXIGA 10 MG TABLET: 10 | 30 days supply | Qty: 30 | Fill #0

## 2020-05-01 ENCOUNTER — Ambulatory Visit (INDEPENDENT_AMBULATORY_CARE_PROVIDER_SITE_OTHER): Payer: Medicare Other

## 2020-05-01 DIAGNOSIS — I428 Other cardiomyopathies: Secondary | ICD-10-CM | POA: Diagnosis not present

## 2020-05-02 LAB — CUP PACEART REMOTE DEVICE CHECK
Battery Remaining Longevity: 54 mo
Battery Remaining Percentage: 75 %
Brady Statistic RA Percent Paced: 94 %
Brady Statistic RV Percent Paced: 95 %
Date Time Interrogation Session: 20220127041100
HighPow Impedance: 47 Ohm
Implantable Lead Implant Date: 20111201
Implantable Lead Implant Date: 20111201
Implantable Lead Implant Date: 20111201
Implantable Lead Location: 753858
Implantable Lead Location: 753859
Implantable Lead Location: 753860
Implantable Lead Model: 158
Implantable Lead Model: 4396
Implantable Lead Model: 5076
Implantable Lead Serial Number: 302703
Implantable Pulse Generator Implant Date: 20160817
Lead Channel Impedance Value: 1009 Ohm
Lead Channel Impedance Value: 592 Ohm
Lead Channel Impedance Value: 670 Ohm
Lead Channel Pacing Threshold Amplitude: 0.7 V
Lead Channel Pacing Threshold Amplitude: 0.9 V
Lead Channel Pacing Threshold Amplitude: 1.9 V
Lead Channel Pacing Threshold Pulse Width: 0.4 ms
Lead Channel Pacing Threshold Pulse Width: 0.4 ms
Lead Channel Pacing Threshold Pulse Width: 1.5 ms
Lead Channel Setting Pacing Amplitude: 2 V
Lead Channel Setting Pacing Amplitude: 2.4 V
Lead Channel Setting Pacing Amplitude: 2.4 V
Lead Channel Setting Pacing Pulse Width: 0.4 ms
Lead Channel Setting Pacing Pulse Width: 1.5 ms
Lead Channel Setting Sensing Sensitivity: 0.5 mV
Lead Channel Setting Sensing Sensitivity: 1 mV
Pulse Gen Serial Number: 113381

## 2020-05-12 ENCOUNTER — Other Ambulatory Visit: Payer: Self-pay | Admitting: Cardiology

## 2020-05-12 ENCOUNTER — Other Ambulatory Visit: Payer: Self-pay | Admitting: Internal Medicine

## 2020-05-12 MED FILL — CARVEDILOL 12.5 MG TABLET: 12.5 | 90 days supply | Qty: 180 | Fill #3

## 2020-05-12 MED FILL — DOFETILIDE 250 MCG CAPS: 250 | 90 days supply | Qty: 180 | Fill #1

## 2020-05-12 MED FILL — FARXIGA 10 MG TABLET: 10 | 30 days supply | Qty: 30 | Fill #1

## 2020-05-12 MED FILL — SPIRONOLACTONE 25 MG TABS: 25 | 90 days supply | Qty: 90 | Fill #0

## 2020-05-12 MED FILL — FUROSEMIDE 40 MG TAB: 40 | 90 days supply | Qty: 90 | Fill #1

## 2020-05-12 NOTE — Progress Notes (Signed)
Remote ICD transmission.   

## 2020-05-20 ENCOUNTER — Other Ambulatory Visit: Payer: Self-pay | Admitting: *Deleted

## 2020-05-20 DIAGNOSIS — I712 Thoracic aortic aneurysm, without rupture, unspecified: Secondary | ICD-10-CM

## 2020-05-26 MED FILL — LISINOPRIL 40 MG TABS: 40 | 90 days supply | Qty: 90 | Fill #2

## 2020-06-09 MED FILL — FARXIGA 10 MG TABLET: 10 | 30 days supply | Qty: 30 | Fill #2

## 2020-06-16 ENCOUNTER — Encounter: Payer: Medicare Other | Admitting: Student

## 2020-06-17 ENCOUNTER — Encounter: Payer: Medicare Other | Admitting: Student

## 2020-06-18 NOTE — Progress Notes (Signed)
Cardiology Office Note Date:  06/18/2020  Patient ID:  [name], [name] Aug 23, 1940, MRN [id number] PCP:  Gaspar Garbe, MD  Cardiologist:  Dr. Jens Som Electrophysiologist: Dr. Graciela Husbands     Chief Complaint:  69mo  History of Present Illness: [name] is a 80 y.o. male with history of VHD (bicuspid AV s/p 25 mm Carpentier-Edwards valve, replacement of ascending aortic aneurysm with 38-mm Hemishield graft, as well as Cox maze procedure in 2009) , AFib, flutter, NICM w/CRT-D, LBBB, HTN, DM.  He comes in today to be seen for Dr. Graciela Husbands, last seen by him Sept 2020, device adjusted to improved BV pacing %.  maintained on Tikosyn.  Feb 2021, he saw Dr. Jens Som was doing well, planned for chest CT to monitor aorta, notes that pt had declined a/c.  More recently he saw A. [place], [place] Sept 2021, noted a slight increased in LV threshold and programmed for 1/2V margin. Had 9hrs of Af, in the setting of ETOH use (celebrating his birthday).  No changes were made.  TODAY He is doing quite well. 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 No SOB No dizzy spells, near syncope or syncope.  Reports his PMD monitors lipids, labs He is noted to be a bit edematous, explains that he had several errands last week and only took his lasix once since he was out and about so much. No DOE, no symptoms of PND/orthopnea Will get back on track with this, this week  Device information BSCi CRT-D, implanted 03/05/2010, gen change 2016  AFib hx Diagnosed 2009 after his AVR AFlutter Oct 2011  AAD Dec 2011 started Tikosyn > dose adjusted for QT    Past Medical History:  Diagnosis Date  . Aortic stenosis/ bicuspid valve--s/p AVR   . Ascending aortic aneurysm (HCC)   . Atrial fibrillation/flutter   . Biventricular implantable cardiac defibrillator- BSX   . Cholelithiases   . Diabetes mellitus without complication (HCC)   . DJD  (degenerative joint disease)   . Hypertension   . LBBB (left bundle branch block)   . Non-ischemic cardiomyopathy (HCC)   . OSA (obstructive sleep apnea)     Past Surgical History:  Procedure Laterality Date  . AORTIC VALVE REPLACEMENT    . APPENDECTOMY    . CERVICAL DISCECTOMY  5/01   C7  . CHOLECYSTECTOMY    . EP IMPLANTABLE DEVICE N/A 11/20/2014   Procedure:  ICD Generator Changeout;  Surgeon: Duke Salvia, MD;  Location: [facility] INVASIVE CV LAB;  Service: Cardiovascular;  Laterality: N/A;  . MEDIAN STERNOTOMY    . TONSILLECTOMY      Current Outpatient Medications  Medication Sig Dispense Refill  . amoxicillin (AMOXIL) 500 MG capsule TAKE 4 CAPSULES (2,000 MG TOTAL) BY MOUTH ONCE AS NEEDED (FOR DENTAL PROCEDURES). 4 capsule 3  . aspirin EC 81 MG tablet Take 81 mg by mouth daily.    . carvedilol (COREG) 12.5 MG tablet TAKE 1 TABLET BY MOUTH TWICE DAILY WITH A MEAL 180 tablet 3  . dofetilide (TIKOSYN) 250 MCG capsule TAKE 1 CAPSULE BY MOUTH TWICE DAILY 180 capsule 3  . FARXIGA 10 MG TABS tablet Take 10 mg by mouth daily.    . furosemide (LASIX) 40 MG tablet TAKE 1 TABLET (40 MG TOTAL) BY MOUTH DAILY. TAKE AN EXTRA DOSE IF NEEDED. PLEASE MAKE ANNUAL APPT WITH DR. CRENSHAW FOR REFILLS 90 tablet 3  .  lisinopril (PRINIVIL,ZESTRIL) 40 MG tablet Take 40 mg by mouth daily.    . metFORMIN (GLUCOPHAGE) 1000 MG tablet Take 1,000 mg by mouth daily with breakfast.   4  . spironolactone (ALDACTONE) 25 MG tablet TAKE 1 TABLET BY MOUTH ONCE DAILY 90 tablet 2   No current facility-administered medications for this visit.    Allergies:   Patient has no known allergies.   Social History:  The patient  reports that he has quit smoking. He has never used smokeless tobacco. He reports that he does not drink alcohol and does not use drugs.   Family History:  The patient's family history includes Alzheimer's disease in his brother; Congestive Heart Failure in his sister; Diabetes in his brother;  Emphysema in his father; Hypertension in his maternal grandmother; Leukemia in his mother; Stroke in his maternal grandmother.  ROS:  Please see the history of present illness.    All other systems are reviewed and otherwise negative.   PHYSICAL EXAM:  VS:  There were no vitals taken for this visit. BMI: There is no height or weight on file to calculate BMI. Well nourished, well developed, in no acute distress HEENT: normocephalic, atraumatic Neck: no JVD, carotid bruits or masses Cardiac:  RRR; no significant murmurs, no rubs, or gallops Lungs: CTA b/l, no wheezing, rhonchi or rales Abd: soft, nontender MS: no deformity or atrophy Ext: trace-1+ edema to Below mid shin edema Skin: warm and dry, no rash Neuro:  No gross deficits appreciated Psych: euthymic mood, full affect  ICD site is stable, no tethering or discomfort   EKG:  Done today and reviewed by myself AV paced, measured QT , QTc , QRS  Device interrogation done today and reviewed by myself:  Battery and lead measuremnts are good A lead output adjusted for 2:1 safety margin 94% AP 96% BP AF burden <1%, longest episode 9 hours Aug 2021 No VT    06/01/19: TTE IMPRESSIONS  1. Left ventricular ejection fraction, by estimation, is 45 to 50% (was  normal in 2016). The left ventricle has mildly decreased function. The  left ventricle demonstrates regional wall motion abnormalities (see  scoring diagram/findings for description).  2. Right ventricular systolic function is normal. The right ventricular  size is normal. There is normal pulmonary artery systolic pressure.  3. The mitral valve is normal in structure and function. Mild mitral  valve regurgitation. No evidence of mitral stenosis.  4. Aortic valve regurgitation is not visualized. Mild aortic valve  stenosis. There is a 25 mm Edwards bioprostetic valve present in the  aortic position. Lower gradient noted n this study - 15/10 mmHg ( was   21/12 mmHg in 2016) which can be related to  mildly reduced LVEF.  5. There is moderate dilatation of the ascending aorta measuring 43 mm (  was 38 mm in 2016).  6. The inferior vena cava is normal in size with greater than 50%  respiratory variability, suggesting right atrial pressure of 3 mmHg.   Recent Labs: 12/18/2019: BUN 26; Creatinine, Ser 1.17; Magnesium 2.0; Potassium 4.7; Sodium 140  No results found for requested labs within last 8760 hours.   CrCl cannot be calculated (Patient's most recent lab result is older than the maximum 21 days allowed.).   Wt Readings from Last 3 Encounters:  12/18/19 214 lb 3.2 oz (97.2 kg)  05/30/19 215 lb (97.5 kg)  01/03/19 216 lb 9.6 oz (98.2 kg)     Other studies reviewed: Additional studies/records reviewed  today include: summarized above  ASSESSMENT AND PLAN:  1. CRT-D     Intact function, as discussed above  2.  NICM      Echo last year 45-50%      96 % BP      Some edema in setting of missed lasix      On BB, ACE, diuretics      Sees Dr. Jens Som next month  Labs today  3. Paroxysmal Afib, flutter     CHA2DS2Vasc is 5, pt has declined a/c     <1 % burden      on Tikosyn, QTc is OK, particularly given QRS duration   4. HTN     No changes today        Disposition: F/u with remotes as usual, Dr. Jens Som as scheduled and EP in clinic in a year, sooner if needed.    After he left, noted that I did not order a mag level, will see if they can run it on blood drawn today.  Otherwise will do when he goes to see Dr. Jens Som.   Current medicines are reviewed at length with the patient today.  The patient did not have any concerns regarding medicines.  Norma Fredrickson, PA-C 06/18/2020 4:42 PM     [facility] [address] [place] [address] [phone] (office)  [phone] (fax)

## 2020-06-24 ENCOUNTER — Encounter: Payer: Self-pay | Admitting: Physician Assistant

## 2020-06-24 ENCOUNTER — Ambulatory Visit (INDEPENDENT_AMBULATORY_CARE_PROVIDER_SITE_OTHER): Payer: Medicare Other | Admitting: Physician Assistant

## 2020-06-24 ENCOUNTER — Other Ambulatory Visit: Payer: Self-pay

## 2020-06-24 VITALS — BP 130/82 | HR 75 | Ht 70.0 in | Wt 214.8 lb

## 2020-06-24 DIAGNOSIS — Z5181 Encounter for therapeutic drug level monitoring: Secondary | ICD-10-CM | POA: Diagnosis not present

## 2020-06-24 DIAGNOSIS — I5022 Chronic systolic (congestive) heart failure: Secondary | ICD-10-CM

## 2020-06-24 DIAGNOSIS — I428 Other cardiomyopathies: Secondary | ICD-10-CM

## 2020-06-24 DIAGNOSIS — I480 Paroxysmal atrial fibrillation: Secondary | ICD-10-CM | POA: Diagnosis not present

## 2020-06-24 DIAGNOSIS — Z95810 Presence of automatic (implantable) cardiac defibrillator: Secondary | ICD-10-CM

## 2020-06-24 DIAGNOSIS — Z79899 Other long term (current) drug therapy: Secondary | ICD-10-CM | POA: Diagnosis not present

## 2020-06-24 LAB — CUP PACEART INCLINIC DEVICE CHECK
Date Time Interrogation Session: 20220322160412
HighPow Impedance: 50 Ohm
Implantable Lead Implant Date: 20111201
Implantable Lead Implant Date: 20111201
Implantable Lead Implant Date: 20111201
Implantable Lead Location: 753858
Implantable Lead Location: 753859
Implantable Lead Location: 753860
Implantable Lead Model: 158
Implantable Lead Model: 4396
Implantable Lead Model: 5076
Implantable Lead Serial Number: 302703
Implantable Pulse Generator Implant Date: 20160817
Lead Channel Impedance Value: 1091 Ohm
Lead Channel Impedance Value: 595 Ohm
Lead Channel Impedance Value: 688 Ohm
Lead Channel Pacing Threshold Amplitude: 0.8 V
Lead Channel Pacing Threshold Amplitude: 1.1 V
Lead Channel Pacing Threshold Amplitude: 1.9 V
Lead Channel Pacing Threshold Pulse Width: 0.4 ms
Lead Channel Pacing Threshold Pulse Width: 0.4 ms
Lead Channel Pacing Threshold Pulse Width: 1.5 ms
Lead Channel Sensing Intrinsic Amplitude: 1.7 mV
Lead Channel Sensing Intrinsic Amplitude: 15.7 mV
Lead Channel Sensing Intrinsic Amplitude: 25 mV
Lead Channel Setting Pacing Amplitude: 2.2 V
Lead Channel Setting Pacing Amplitude: 2.4 V
Lead Channel Setting Pacing Amplitude: 2.4 V
Lead Channel Setting Pacing Pulse Width: 0.4 ms
Lead Channel Setting Pacing Pulse Width: 1.5 ms
Lead Channel Setting Sensing Sensitivity: 0.5 mV
Lead Channel Setting Sensing Sensitivity: 1 mV
Pulse Gen Serial Number: 113381

## 2020-06-24 LAB — BASIC METABOLIC PANEL
BUN/Creatinine Ratio: 23 (ref 10–24)
BUN: 28 mg/dL — ABNORMAL HIGH (ref 8–27)
CO2: 22 mmol/L (ref 20–29)
Calcium: 9.6 mg/dL (ref 8.6–10.2)
Chloride: 100 mmol/L (ref 96–106)
Creatinine, Ser: 1.21 mg/dL (ref 0.76–1.27)
Glucose: 170 mg/dL — ABNORMAL HIGH (ref 65–99)
Potassium: 5.1 mmol/L (ref 3.5–5.2)
Sodium: 139 mmol/L (ref 134–144)
eGFR: 61 mL/min/{1.73_m2} (ref >59)

## 2020-06-24 NOTE — Patient Instructions (Signed)
Medication Instructions:   Your physician recommends that you continue on your current medications as directed. Please refer to the Current Medication list given to you today.  *If you need a refill on your cardiac medications before your next appointment, please call your pharmacy*    BMET TODAY    If you have labs (blood work) drawn today and your tests are completely normal, you will receive your results only by: Marland Kitchen MyChart Message (if you have MyChart) OR . A paper copy in the mail If you have any lab test that is abnormal or we need to change your treatment, we will call you to review the results.   Testing/Procedures: NONE ORDERED  TODAY   Follow-Up: At [facility], you and your health needs are our priority.  As part of our continuing mission to provide you with exceptional heart care, we have created designated Provider Care Teams.  These Care Teams include your primary Cardiologist (physician) and Advanced Practice Providers (APPs -  Physician Assistants and Nurse Practitioners) who all work together to provide you with the care you need, when you need it.  We recommend signing up for the patient portal called "MyChart".  Sign up information is provided on this After Visit Summary.  MyChart is used to connect with patients for Virtual Visits (Telemedicine).  Patients are able to view lab/test results, encounter notes, upcoming appointments, etc.  Non-urgent messages can be sent to your provider as well.   To learn more about what you can do with MyChart, go to [email or web address].    Your next appointment:    1 year(s)  The format for your next appointment:   In Person  Provider:   Sherryl Manges, MD   Other Instructions

## 2020-06-26 LAB — SPECIMEN STATUS REPORT

## 2020-06-26 LAB — MAGNESIUM: Magnesium: 2.4 mg/dL — ABNORMAL HIGH (ref 1.6–2.3)

## 2020-07-02 NOTE — Progress Notes (Signed)
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 Note a cardiac catheterization in April of 2009 prior to his procedure showed an ejection fraction of 20%, no coronary disease, a dilated aortic root, moderate aortic stenosis and mild mitral regurgitation. The patient subsequently had aortic valve replacement with 25 mm Carpentier-Edwards valve, replacement of ascending aortic aneurysm with 38-mm Hemishield graft, modified Cox maze III. Note he did develop atrial fibrillation 6 months after his procedure. A cardioversion was attempted by his report but he only held sinus for one week. Patient had CRT-D on December 2 of 2011. He was placed on tikosyn for his atrial fibrillation/flutter. His dose was decreased because of increasing QT. Most recent echocardiogram February 2021 showed ejection fraction 45 to 50%, mild mitral regurgitation, prior aortic valve replacement with mean gradient 10 mmHg, moderately dilated ascending aorta at 43 mm.  CTA March 2021 showed ascending aorta measuring 4.2 x 3.9 cm which was unchanged compared to 2014. Since I last saw him,patient denies dyspnea, chest pain, palpitations or syncope.  Current Outpatient Medications  Medication Sig Dispense Refill  . albuterol (VENTOLIN HFA) 108 (90 Base) MCG/ACT inhaler INHALE 2 PUFFS BY MOUTH EVERY 6 (SIX) HOURS IF NEEDED FOR WHEEZING OR SHORTNESS OF BREATH. 8.5 g 0  . amoxicillin (AMOXIL) 500 MG capsule TAKE 4 CAPSULES (2,000 MG TOTAL) BY MOUTH ONCE AS NEEDED (FOR DENTAL PROCEDURES). 4 capsule 3  . aspirin EC 81 MG tablet Take 81 mg by mouth daily.    . carvedilol (COREG) 12.5 MG tablet TAKE 1 TABLET BY MOUTH TWICE DAILY WITH A MEAL 180 tablet 3  . dapagliflozin propanediol (FARXIGA) 10 MG TABS tablet TAKE 1 TABLET BY MOUTH ONCE DAILY 30 tablet 11  . dofetilide (TIKOSYN) 250 MCG capsule TAKE 1 CAPSULE BY MOUTH TWICE DAILY  180 capsule 3  . furosemide (LASIX) 40 MG tablet TAKE 1 TABLET BY MOUTH DAILY. TAKE AN EXTRA DOSE IF NEEDED. PLEASE MAKE ANNUAL APPT WITH DR. Anastyn Ayars FOR REFILLS 90 tablet 3  . lisinopril (PRINIVIL,ZESTRIL) 40 MG tablet Take 40 mg by mouth daily.    . metFORMIN (GLUCOPHAGE) 1000 MG tablet Take 1,000 mg by mouth daily with breakfast.   4  . spironolactone (ALDACTONE) 25 MG tablet TAKE 1 TABLET BY MOUTH ONCE DAILY 90 tablet 2  . allopurinol (ZYLOPRIM) 300 MG tablet TAKE 1 TABLET BY MOUTH ONCE DAILY (Patient not taking: Reported on 07/09/2020) 90 tablet 1  . carvedilol (COREG) 12.5 MG tablet TAKE 1 TABLET BY MOUTH TWICE DAILY WITH A MEAL 180 tablet 3  . FARXIGA 10 MG TABS tablet Take 10 mg by mouth daily.    Marland Kitchen lisinopril (ZESTRIL) 40 MG tablet TAKE 1 TABLET BY MOUTH ONCE DAILY 90 tablet 3  . metFORMIN (GLUCOPHAGE) 1000 MG tablet TAKE 1 TABLET BY MOUTH TWICE DAILY 180 tablet 3   No current facility-administered medications for this visit.     Past Medical History:  Diagnosis Date  . Aortic stenosis/ bicuspid valve--s/p AVR   . Ascending aortic aneurysm (HCC)   . Atrial fibrillation/flutter   . Biventricular implantable cardiac defibrillator- BSX   . Cholelithiases   . Diabetes mellitus without complication (HCC)   . DJD (degenerative joint disease)   . Hypertension   . LBBB (left bundle branch block)   . Non-ischemic cardiomyopathy (HCC)   . OSA (obstructive sleep apnea)  Past Surgical History:  Procedure Laterality Date  . AORTIC VALVE REPLACEMENT    . APPENDECTOMY    . CERVICAL DISCECTOMY  5/01   C7  . CHOLECYSTECTOMY    . EP IMPLANTABLE DEVICE N/A 11/20/2014   Procedure:  ICD Generator Changeout;  Surgeon: Duke Salvia, MD;  Location: [facility] INVASIVE CV LAB;  Service: Cardiovascular;  Laterality: N/A;  . MEDIAN STERNOTOMY    . TONSILLECTOMY      Social History   Socioeconomic History  . Marital status: Married    Spouse name: Not on file  . Number of children: 2  .  Years of education: Not on file  . Highest education level: Not on file  Occupational History  . Occupation: retired Art gallery manager  Tobacco Use  . Smoking status: Former Games developer  . Smokeless tobacco: Never Used  Vaping Use  . Vaping Use: Never used  Substance and Sexual Activity  . Alcohol use: No  . Drug use: No  . Sexual activity: Not on file  Other Topics Concern  . Not on file  Social History Narrative  . Not on file   Social Determinants of Health   Financial Resource Strain: Not on file  Food Insecurity: Not on file  Transportation Needs: Not on file  Physical Activity: Not on file  Stress: Not on file  Social Connections: Not on file  Intimate Partner Violence: Not on file    Family History  Problem Relation Age of Onset  . Emphysema Father   . Leukemia Mother   . Hypertension Maternal Grandmother   . Stroke Maternal Grandmother   . Congestive Heart Failure Sister   . Diabetes Brother   . Alzheimer's disease Brother     ROS: no fevers or chills, productive cough, hemoptysis, dysphasia, odynophagia, melena, hematochezia, dysuria, hematuria, rash, seizure activity, orthopnea, PND, pedal edema, claudication. Remaining systems are negative.  Physical Exam: Well-developed well-nourished in no acute distress.  Skin is warm and dry.  HEENT is normal.  Neck is supple.  Chest is clear to auscultation with normal expansion.  Cardiovascular exam is regular rate and rhythm.  Abdominal exam nontender or distended. No masses palpated. Extremities show no edema. neuro grossly intact  ECG-ventricular pacing.  Personally reviewed  A/P  1 paroxysmal atrial fibrillation-patient remains in ventricular paced rhythm.  Continue Tikosyn and beta-blocker.  He continues to decline anticoagulation understanding the higher risk of CVA.  2 prior aortic valve replacement-stable on most recent echocardiogram.  Continue SBE prophylaxis.  3 nonischemic cardiomyopathy-LV function mildly  reduced on most recent echocardiogram.  Continue ACE inhibitor and beta-blocker.  4 thoracic aortic aneurysm-unchanged on most recent CTA dating back to 2014.  We will plan to repeat study March 2023.  5 hypertension-blood pressure controlled.  Continue present medications and follow.  6 ICD-Per Dr. Graciela Husbands.  Olga Millers, MD

## 2020-07-05 ENCOUNTER — Other Ambulatory Visit (HOSPITAL_BASED_OUTPATIENT_CLINIC_OR_DEPARTMENT_OTHER): Payer: Self-pay

## 2020-07-05 ENCOUNTER — Other Ambulatory Visit: Payer: Self-pay | Admitting: Cardiology

## 2020-07-07 ENCOUNTER — Other Ambulatory Visit: Payer: Self-pay

## 2020-07-07 ENCOUNTER — Other Ambulatory Visit (HOSPITAL_BASED_OUTPATIENT_CLINIC_OR_DEPARTMENT_OTHER): Payer: Self-pay

## 2020-07-07 ENCOUNTER — Ambulatory Visit (HOSPITAL_BASED_OUTPATIENT_CLINIC_OR_DEPARTMENT_OTHER)
Admission: RE | Admit: 2020-07-07 | Discharge: 2020-07-07 | Disposition: A | Discharge status: Home / Self Care | Payer: Medicare Other | Source: Ambulatory Visit | Attending: Cardiology | Admitting: Cardiology

## 2020-07-07 ENCOUNTER — Ambulatory Visit (HOSPITAL_BASED_OUTPATIENT_CLINIC_OR_DEPARTMENT_OTHER): Payer: Medicare Other

## 2020-07-07 ENCOUNTER — Encounter (HOSPITAL_BASED_OUTPATIENT_CLINIC_OR_DEPARTMENT_OTHER): Payer: Self-pay

## 2020-07-07 DIAGNOSIS — I712 Thoracic aortic aneurysm, without rupture, unspecified: Secondary | ICD-10-CM

## 2020-07-07 MED ORDER — IOHEXOL 350 MG/ML SOLN
100.0000 mL | Freq: Once | INTRAVENOUS | Status: AC | PRN
  Administered 2020-07-07: 100 mL via INTRAVENOUS

## 2020-07-07 MED ORDER — CARVEDILOL 12.5 MG PO TABS
ORAL_TABLET | Freq: Two times a day (BID) | ORAL | 3 refills | Status: DC
  Filled 2020-07-07 – 2020-08-18 (×2): qty 180, 90d supply, fill #0
  Filled 2020-11-27: qty 180, 90d supply, fill #1
  Filled 2021-03-11: qty 180, 90d supply, fill #2
  Filled 2021-06-15: qty 180, 90d supply, fill #3

## 2020-07-07 MED FILL — Dapagliflozin Propanediol Tab 10 MG (Base Equivalent): ORAL | 30 days supply | Qty: 30 | Fill #0 | Status: AC

## 2020-07-09 ENCOUNTER — Ambulatory Visit: Payer: Medicare Other | Admitting: Cardiology

## 2020-07-09 ENCOUNTER — Other Ambulatory Visit: Payer: Self-pay

## 2020-07-09 ENCOUNTER — Encounter: Payer: Self-pay | Admitting: Cardiology

## 2020-07-09 VITALS — BP 110/62 | HR 75 | Ht 70.0 in | Wt 213.0 lb

## 2020-07-09 DIAGNOSIS — I712 Thoracic aortic aneurysm, without rupture, unspecified: Secondary | ICD-10-CM

## 2020-07-09 DIAGNOSIS — I10 Essential (primary) hypertension: Secondary | ICD-10-CM | POA: Diagnosis not present

## 2020-07-09 DIAGNOSIS — Z952 Presence of prosthetic heart valve: Secondary | ICD-10-CM

## 2020-07-09 DIAGNOSIS — I480 Paroxysmal atrial fibrillation: Secondary | ICD-10-CM

## 2020-07-09 DIAGNOSIS — I428 Other cardiomyopathies: Secondary | ICD-10-CM

## 2020-07-09 NOTE — Patient Instructions (Signed)

## 2020-07-15 ENCOUNTER — Other Ambulatory Visit (HOSPITAL_BASED_OUTPATIENT_CLINIC_OR_DEPARTMENT_OTHER): Payer: Self-pay

## 2020-07-15 MED FILL — Metformin HCl Tab 1000 MG: ORAL | 90 days supply | Qty: 180 | Fill #0 | Status: AC

## 2020-07-31 ENCOUNTER — Ambulatory Visit (INDEPENDENT_AMBULATORY_CARE_PROVIDER_SITE_OTHER): Payer: Medicare Other

## 2020-07-31 DIAGNOSIS — I428 Other cardiomyopathies: Secondary | ICD-10-CM | POA: Diagnosis not present

## 2020-07-31 LAB — CUP PACEART REMOTE DEVICE CHECK
Battery Remaining Longevity: 48 mo
Battery Remaining Percentage: 68 %
Brady Statistic RA Percent Paced: 97 %
Brady Statistic RV Percent Paced: 98 %
Date Time Interrogation Session: 20220428041100
HighPow Impedance: 44 Ohm
Implantable Lead Implant Date: 20111201
Implantable Lead Implant Date: 20111201
Implantable Lead Implant Date: 20111201
Implantable Lead Location: 753858
Implantable Lead Location: 753859
Implantable Lead Location: 753860
Implantable Lead Model: 158
Implantable Lead Model: 4396
Implantable Lead Model: 5076
Implantable Lead Serial Number: 302703
Implantable Pulse Generator Implant Date: 20160817
Lead Channel Impedance Value: 555 Ohm
Lead Channel Impedance Value: 632 Ohm
Lead Channel Impedance Value: 945 Ohm
Lead Channel Pacing Threshold Amplitude: 0.8 V
Lead Channel Pacing Threshold Amplitude: 1.1 V
Lead Channel Pacing Threshold Amplitude: 1.9 V
Lead Channel Pacing Threshold Pulse Width: 0.4 ms
Lead Channel Pacing Threshold Pulse Width: 0.4 ms
Lead Channel Pacing Threshold Pulse Width: 1.5 ms
Lead Channel Setting Pacing Amplitude: 2.2 V
Lead Channel Setting Pacing Amplitude: 2.4 V
Lead Channel Setting Pacing Amplitude: 2.4 V
Lead Channel Setting Pacing Pulse Width: 0.4 ms
Lead Channel Setting Pacing Pulse Width: 1.5 ms
Lead Channel Setting Sensing Sensitivity: 0.5 mV
Lead Channel Setting Sensing Sensitivity: 1 mV
Pulse Gen Serial Number: 113381

## 2020-08-11 ENCOUNTER — Other Ambulatory Visit (HOSPITAL_BASED_OUTPATIENT_CLINIC_OR_DEPARTMENT_OTHER): Payer: Self-pay

## 2020-08-11 MED FILL — Spironolactone Tab 25 MG: ORAL | 90 days supply | Qty: 90 | Fill #0 | Status: AC

## 2020-08-11 MED FILL — Dapagliflozin Propanediol Tab 10 MG (Base Equivalent): ORAL | 30 days supply | Qty: 30 | Fill #1 | Status: AC

## 2020-08-18 ENCOUNTER — Other Ambulatory Visit (HOSPITAL_BASED_OUTPATIENT_CLINIC_OR_DEPARTMENT_OTHER): Payer: Self-pay

## 2020-08-18 MED FILL — Dofetilide Cap 250 MCG (0.25 MG): ORAL | 90 days supply | Qty: 180 | Fill #0 | Status: AC

## 2020-08-19 ENCOUNTER — Other Ambulatory Visit (HOSPITAL_BASED_OUTPATIENT_CLINIC_OR_DEPARTMENT_OTHER): Payer: Self-pay

## 2020-08-21 NOTE — Progress Notes (Signed)
Remote ICD transmission.   

## 2020-08-23 MED FILL — Lisinopril Tab 40 MG: ORAL | 90 days supply | Qty: 90 | Fill #0 | Status: AC

## 2020-08-25 ENCOUNTER — Other Ambulatory Visit (HOSPITAL_BASED_OUTPATIENT_CLINIC_OR_DEPARTMENT_OTHER): Payer: Self-pay

## 2020-09-02 ENCOUNTER — Other Ambulatory Visit (HOSPITAL_BASED_OUTPATIENT_CLINIC_OR_DEPARTMENT_OTHER): Payer: Self-pay

## 2020-09-06 MED FILL — Dapagliflozin Propanediol Tab 10 MG (Base Equivalent): ORAL | 30 days supply | Qty: 30 | Fill #2 | Status: AC

## 2020-09-08 ENCOUNTER — Other Ambulatory Visit (HOSPITAL_BASED_OUTPATIENT_CLINIC_OR_DEPARTMENT_OTHER): Payer: Self-pay

## 2020-10-11 MED FILL — Dapagliflozin Propanediol Tab 10 MG (Base Equivalent): ORAL | 30 days supply | Qty: 30 | Fill #3 | Status: AC

## 2020-10-13 ENCOUNTER — Other Ambulatory Visit (HOSPITAL_BASED_OUTPATIENT_CLINIC_OR_DEPARTMENT_OTHER): Payer: Self-pay

## 2020-10-18 MED FILL — Metformin HCl Tab 1000 MG: ORAL | 90 days supply | Qty: 180 | Fill #1 | Status: AC

## 2020-10-20 ENCOUNTER — Other Ambulatory Visit (HOSPITAL_BASED_OUTPATIENT_CLINIC_OR_DEPARTMENT_OTHER): Payer: Self-pay

## 2020-10-30 ENCOUNTER — Ambulatory Visit (INDEPENDENT_AMBULATORY_CARE_PROVIDER_SITE_OTHER): Payer: Medicare Other

## 2020-10-30 DIAGNOSIS — I428 Other cardiomyopathies: Secondary | ICD-10-CM | POA: Diagnosis not present

## 2020-10-31 LAB — CUP PACEART REMOTE DEVICE CHECK
Battery Remaining Longevity: 48 mo
Battery Remaining Percentage: 64 %
Brady Statistic RA Percent Paced: 36 %
Brady Statistic RV Percent Paced: 93 %
Date Time Interrogation Session: 20220728041300
HighPow Impedance: 47 Ohm
Implantable Lead Implant Date: 20111201
Implantable Lead Implant Date: 20111201
Implantable Lead Implant Date: 20111201
Implantable Lead Location: 753858
Implantable Lead Location: 753859
Implantable Lead Location: 753860
Implantable Lead Model: 158
Implantable Lead Model: 4396
Implantable Lead Model: 5076
Implantable Lead Serial Number: 302703
Implantable Pulse Generator Implant Date: 20160817
Lead Channel Impedance Value: 1032 Ohm
Lead Channel Impedance Value: 601 Ohm
Lead Channel Impedance Value: 681 Ohm
Lead Channel Pacing Threshold Amplitude: 0.8 V
Lead Channel Pacing Threshold Amplitude: 1.1 V
Lead Channel Pacing Threshold Amplitude: 1.9 V
Lead Channel Pacing Threshold Pulse Width: 0.4 ms
Lead Channel Pacing Threshold Pulse Width: 0.4 ms
Lead Channel Pacing Threshold Pulse Width: 1.5 ms
Lead Channel Setting Pacing Amplitude: 2.2 V
Lead Channel Setting Pacing Amplitude: 2.4 V
Lead Channel Setting Pacing Amplitude: 2.4 V
Lead Channel Setting Pacing Pulse Width: 0.4 ms
Lead Channel Setting Pacing Pulse Width: 1.5 ms
Lead Channel Setting Sensing Sensitivity: 0.5 mV
Lead Channel Setting Sensing Sensitivity: 1 mV
Pulse Gen Serial Number: 113381

## 2020-11-03 ENCOUNTER — Other Ambulatory Visit (HOSPITAL_BASED_OUTPATIENT_CLINIC_OR_DEPARTMENT_OTHER): Payer: Self-pay

## 2020-11-03 MED FILL — Dapagliflozin Propanediol Tab 10 MG (Base Equivalent): ORAL | 30 days supply | Qty: 30 | Fill #4 | Status: AC

## 2020-11-03 MED FILL — Spironolactone Tab 25 MG: ORAL | 90 days supply | Qty: 90 | Fill #1 | Status: AC

## 2020-11-21 ENCOUNTER — Other Ambulatory Visit (HOSPITAL_BASED_OUTPATIENT_CLINIC_OR_DEPARTMENT_OTHER): Payer: Self-pay

## 2020-11-25 ENCOUNTER — Other Ambulatory Visit (HOSPITAL_BASED_OUTPATIENT_CLINIC_OR_DEPARTMENT_OTHER): Payer: Self-pay

## 2020-11-25 MED ORDER — LISINOPRIL 40 MG PO TABS
40.0000 mg | ORAL_TABLET | Freq: Every day | ORAL | 3 refills | Status: DC
  Filled 2020-11-25: qty 90, 90d supply, fill #0
  Filled 2021-02-20: qty 90, 90d supply, fill #1
  Filled 2021-05-18: qty 90, 90d supply, fill #2
  Filled 2021-08-23 – 2021-09-01 (×2): qty 90, 90d supply, fill #3

## 2020-11-26 NOTE — Progress Notes (Signed)
Remote ICD transmission.   

## 2020-11-27 ENCOUNTER — Other Ambulatory Visit (HOSPITAL_BASED_OUTPATIENT_CLINIC_OR_DEPARTMENT_OTHER): Payer: Self-pay

## 2020-11-27 MED FILL — Dofetilide Cap 250 MCG (0.25 MG): ORAL | 90 days supply | Qty: 180 | Fill #1 | Status: AC

## 2020-11-28 ENCOUNTER — Other Ambulatory Visit (HOSPITAL_BASED_OUTPATIENT_CLINIC_OR_DEPARTMENT_OTHER): Payer: Self-pay

## 2020-12-05 ENCOUNTER — Other Ambulatory Visit (HOSPITAL_BASED_OUTPATIENT_CLINIC_OR_DEPARTMENT_OTHER): Payer: Self-pay

## 2020-12-05 MED FILL — Dapagliflozin Propanediol Tab 10 MG (Base Equivalent): ORAL | 30 days supply | Qty: 30 | Fill #5 | Status: AC

## 2021-01-08 ENCOUNTER — Other Ambulatory Visit (HOSPITAL_BASED_OUTPATIENT_CLINIC_OR_DEPARTMENT_OTHER): Payer: Self-pay

## 2021-01-08 MED FILL — Dapagliflozin Propanediol Tab 10 MG (Base Equivalent): ORAL | 30 days supply | Qty: 30 | Fill #6 | Status: AC

## 2021-01-15 ENCOUNTER — Other Ambulatory Visit (HOSPITAL_BASED_OUTPATIENT_CLINIC_OR_DEPARTMENT_OTHER): Payer: Self-pay

## 2021-01-15 MED ORDER — ZOSTER VAC RECOMB ADJUVANTED 50 MCG/0.5ML IM SUSR
INTRAMUSCULAR | 0 refills | Status: DC
  Filled 2021-01-15: qty 0.5, 1d supply, fill #0

## 2021-01-29 ENCOUNTER — Ambulatory Visit (INDEPENDENT_AMBULATORY_CARE_PROVIDER_SITE_OTHER): Payer: Medicare Other

## 2021-01-29 DIAGNOSIS — I428 Other cardiomyopathies: Secondary | ICD-10-CM | POA: Diagnosis not present

## 2021-01-29 LAB — CUP PACEART REMOTE DEVICE CHECK
Battery Remaining Longevity: 42 mo
Battery Remaining Percentage: 61 %
Brady Statistic RA Percent Paced: 16 %
Brady Statistic RV Percent Paced: 82 %
Date Time Interrogation Session: 20221027041000
HighPow Impedance: 47 Ohm
Implantable Lead Implant Date: 20111201
Implantable Lead Implant Date: 20111201
Implantable Lead Implant Date: 20111201
Implantable Lead Location: 753858
Implantable Lead Location: 753859
Implantable Lead Location: 753860
Implantable Lead Model: 158
Implantable Lead Model: 4396
Implantable Lead Model: 5076
Implantable Lead Serial Number: 302703
Implantable Pulse Generator Implant Date: 20160817
Lead Channel Impedance Value: 604 Ohm
Lead Channel Impedance Value: 700 Ohm
Lead Channel Impedance Value: 993 Ohm
Lead Channel Pacing Threshold Amplitude: 0.8 V
Lead Channel Pacing Threshold Amplitude: 1.1 V
Lead Channel Pacing Threshold Amplitude: 1.9 V
Lead Channel Pacing Threshold Pulse Width: 0.4 ms
Lead Channel Pacing Threshold Pulse Width: 0.4 ms
Lead Channel Pacing Threshold Pulse Width: 1.5 ms
Lead Channel Setting Pacing Amplitude: 2.2 V
Lead Channel Setting Pacing Amplitude: 2.4 V
Lead Channel Setting Pacing Amplitude: 2.4 V
Lead Channel Setting Pacing Pulse Width: 0.4 ms
Lead Channel Setting Pacing Pulse Width: 1.5 ms
Lead Channel Setting Sensing Sensitivity: 0.5 mV
Lead Channel Setting Sensing Sensitivity: 1 mV
Pulse Gen Serial Number: 113381

## 2021-02-04 ENCOUNTER — Other Ambulatory Visit: Payer: Self-pay | Admitting: Internal Medicine

## 2021-02-04 ENCOUNTER — Other Ambulatory Visit (HOSPITAL_BASED_OUTPATIENT_CLINIC_OR_DEPARTMENT_OTHER): Payer: Self-pay

## 2021-02-04 MED FILL — Dapagliflozin Propanediol Tab 10 MG (Base Equivalent): ORAL | 30 days supply | Qty: 30 | Fill #7 | Status: AC

## 2021-02-05 ENCOUNTER — Other Ambulatory Visit (HOSPITAL_BASED_OUTPATIENT_CLINIC_OR_DEPARTMENT_OTHER): Payer: Self-pay

## 2021-02-05 MED ORDER — SPIRONOLACTONE 25 MG PO TABS
ORAL_TABLET | Freq: Every day | ORAL | 2 refills | Status: DC
  Filled 2021-02-05: qty 90, 90d supply, fill #0
  Filled 2021-05-04: qty 90, 90d supply, fill #1
  Filled 2021-08-08 – 2021-08-17 (×2): qty 90, 90d supply, fill #2

## 2021-02-06 NOTE — Progress Notes (Signed)
Remote ICD transmission.   

## 2021-02-09 ENCOUNTER — Other Ambulatory Visit (HOSPITAL_BASED_OUTPATIENT_CLINIC_OR_DEPARTMENT_OTHER): Payer: Self-pay

## 2021-02-09 MED ORDER — METFORMIN HCL 1000 MG PO TABS
1000.0000 mg | ORAL_TABLET | Freq: Two times a day (BID) | ORAL | 3 refills | Status: DC
  Filled 2021-02-09: qty 180, 90d supply, fill #0
  Filled 2021-05-11: qty 180, 90d supply, fill #1
  Filled 2021-11-07: qty 180, 90d supply, fill #2

## 2021-02-20 ENCOUNTER — Other Ambulatory Visit (HOSPITAL_BASED_OUTPATIENT_CLINIC_OR_DEPARTMENT_OTHER): Payer: Self-pay

## 2021-02-23 ENCOUNTER — Other Ambulatory Visit (HOSPITAL_BASED_OUTPATIENT_CLINIC_OR_DEPARTMENT_OTHER): Payer: Self-pay

## 2021-02-23 MED ORDER — SPIRONOLACTONE 25 MG PO TABS
ORAL_TABLET | ORAL | 3 refills | Status: DC
  Filled 2021-02-23 – 2021-05-11 (×3): qty 90, 90d supply, fill #0

## 2021-02-25 ENCOUNTER — Other Ambulatory Visit (HOSPITAL_BASED_OUTPATIENT_CLINIC_OR_DEPARTMENT_OTHER): Payer: Self-pay

## 2021-02-25 MED ORDER — ONETOUCH VERIO VI STRP
ORAL_STRIP | 5 refills | Status: DC
  Filled 2021-02-25: qty 100, 90d supply, fill #0
  Filled 2022-02-17: qty 100, 90d supply, fill #1

## 2021-03-01 ENCOUNTER — Other Ambulatory Visit: Payer: Self-pay | Admitting: Internal Medicine

## 2021-03-03 ENCOUNTER — Other Ambulatory Visit (HOSPITAL_BASED_OUTPATIENT_CLINIC_OR_DEPARTMENT_OTHER): Payer: Self-pay

## 2021-03-03 MED ORDER — DOFETILIDE 250 MCG PO CAPS
ORAL_CAPSULE | Freq: Two times a day (BID) | ORAL | 0 refills | Status: DC
  Filled 2021-03-03 – 2021-03-11 (×2): qty 180, 90d supply, fill #0

## 2021-03-09 ENCOUNTER — Other Ambulatory Visit (HOSPITAL_BASED_OUTPATIENT_CLINIC_OR_DEPARTMENT_OTHER): Payer: Self-pay

## 2021-03-10 ENCOUNTER — Other Ambulatory Visit (HOSPITAL_BASED_OUTPATIENT_CLINIC_OR_DEPARTMENT_OTHER): Payer: Self-pay

## 2021-03-11 ENCOUNTER — Other Ambulatory Visit: Payer: Self-pay | Admitting: Cardiology

## 2021-03-11 ENCOUNTER — Other Ambulatory Visit (HOSPITAL_BASED_OUTPATIENT_CLINIC_OR_DEPARTMENT_OTHER): Payer: Self-pay

## 2021-03-11 MED ORDER — FUROSEMIDE 40 MG PO TABS
ORAL_TABLET | ORAL | 0 refills | Status: DC
  Filled 2021-03-11: qty 90, 90d supply, fill #0

## 2021-03-16 MED FILL — Dapagliflozin Propanediol Tab 10 MG (Base Equivalent): ORAL | 30 days supply | Qty: 30 | Fill #8 | Status: AC

## 2021-03-17 ENCOUNTER — Other Ambulatory Visit (HOSPITAL_BASED_OUTPATIENT_CLINIC_OR_DEPARTMENT_OTHER): Payer: Self-pay

## 2021-04-03 ENCOUNTER — Other Ambulatory Visit (HOSPITAL_BASED_OUTPATIENT_CLINIC_OR_DEPARTMENT_OTHER): Payer: Self-pay

## 2021-04-13 ENCOUNTER — Other Ambulatory Visit (HOSPITAL_BASED_OUTPATIENT_CLINIC_OR_DEPARTMENT_OTHER): Payer: Self-pay

## 2021-04-13 ENCOUNTER — Other Ambulatory Visit: Payer: Self-pay

## 2021-04-13 ENCOUNTER — Encounter: Payer: Self-pay | Admitting: Internal Medicine

## 2021-04-13 ENCOUNTER — Ambulatory Visit: Payer: Medicare Other | Admitting: Internal Medicine

## 2021-04-13 VITALS — BP 122/68 | HR 71 | Ht 70.0 in | Wt 208.8 lb

## 2021-04-13 DIAGNOSIS — Z95810 Presence of automatic (implantable) cardiac defibrillator: Secondary | ICD-10-CM | POA: Diagnosis not present

## 2021-04-13 DIAGNOSIS — Z79899 Other long term (current) drug therapy: Secondary | ICD-10-CM | POA: Diagnosis not present

## 2021-04-13 DIAGNOSIS — I480 Paroxysmal atrial fibrillation: Secondary | ICD-10-CM

## 2021-04-13 DIAGNOSIS — I428 Other cardiomyopathies: Secondary | ICD-10-CM | POA: Diagnosis not present

## 2021-04-13 LAB — BASIC METABOLIC PANEL
BUN/Creatinine Ratio: 31 — ABNORMAL HIGH (ref 10–24)
BUN: 41 mg/dL — ABNORMAL HIGH (ref 8–27)
CO2: 21 mmol/L (ref 20–29)
Calcium: 9.3 mg/dL (ref 8.6–10.2)
Chloride: 105 mmol/L (ref 96–106)
Creatinine, Ser: 1.32 mg/dL — ABNORMAL HIGH (ref 0.76–1.27)
Glucose: 157 mg/dL — ABNORMAL HIGH (ref 70–99)
Potassium: 4.9 mmol/L (ref 3.5–5.2)
Sodium: 140 mmol/L (ref 134–144)
eGFR: 54 mL/min/{1.73_m2} — ABNORMAL LOW (ref >59)

## 2021-04-13 LAB — MAGNESIUM: Magnesium: 2.5 mg/dL — ABNORMAL HIGH (ref 1.6–2.3)

## 2021-04-13 MED ORDER — APIXABAN 5 MG PO TABS
5.0000 mg | ORAL_TABLET | Freq: Two times a day (BID) | ORAL | 3 refills | Status: DC
  Filled 2021-04-13: qty 180, 90d supply, fill #0
  Filled 2021-07-19: qty 60, 30d supply, fill #1
  Filled 2021-09-01: qty 60, 30d supply, fill #2
  Filled 2021-09-27: qty 60, 30d supply, fill #3
  Filled 2021-11-07: qty 60, 30d supply, fill #4
  Filled 2021-11-21 – 2021-11-30 (×2): qty 60, 30d supply, fill #5
  Filled 2022-01-02: qty 60, 30d supply, fill #6
  Filled 2022-03-06: qty 60, 30d supply, fill #7
  Filled ????-??-??: fill #5

## 2021-04-13 NOTE — Progress Notes (Signed)
Patient Care Team: [name], [name], MD as PCP - General [name], MD as PCP - Electrophysiology (Cardiology)   HPI  [name] is a 81 y.o. male Seen in followup for CRT-D implantation in the fall 2011 for nonischemic cardiomyopathy. He underwent device replacement 8/16 He also had a history of atrial flutter.  2009 had undertoneporcine aortic valve replacement with 25 mm Carpentier-Edwards valve, replacement of ascending aortic aneurysm with 38-mm Hemishield graft, as well as Cox maze procedure for atrial fibrillation.    He takes dofetilide; previously anticoagulation but now on ASA Atrial fibrillation has been persistent since mid June 2022  TERF include age, CAD DM CHF for CHADSVASc> 5   Today, he is doing well. 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 However, since his last visit, he feels more physically limited which he relates to old age.  His balance has been worsening. He no longer boats or fishes on his own because of his poor balance.  A few times a week, he enjoys wine or beer with dinner. He is hesitant to take Coumadin because of risk of bleeding and he does not want to further limit his ability to work outside. However, he is open to trying alternative blood thinners.  The patient denies chest pain, shortness of breath, nocturnal dyspnea, orthopnea or peripheral edema.  There have been no palpitations or syncope.  Complains of worsening balance and dizziness.  DATE TEST EF   4/09 LHC  20 %   8//14 Echo   50 %   12/16 Echo  60-65% Mean A Grad 12  2/21 Echo 45-50 %    Aortic arch was reviewed by CT scanning 2/19 4.1 cm  Date Cr K Mg Hgb  2/19 1.14 5.0>>5.1 2.8 14.9 (4/19)  2/20 1.08 4.5 2.2 15.6  3/22 1.21 5.1 2.4             Past Medical History:  Diagnosis Date   Aortic stenosis/ bicuspid valve--s/p AVR    Ascending aortic aneurysm    Atrial fibrillation/flutter    Biventricular implantable cardiac  defibrillator- BSX    Cholelithiases    Diabetes mellitus without complication (HCC)    DJD (degenerative joint disease)    Hypertension    LBBB (left bundle branch block)    Non-ischemic cardiomyopathy (HCC)    OSA (obstructive sleep apnea)     Past Surgical History:  Procedure Laterality Date   AORTIC VALVE REPLACEMENT     APPENDECTOMY     CERVICAL DISCECTOMY  5/01   C7   CHOLECYSTECTOMY     EP IMPLANTABLE DEVICE N/A 11/20/2014   Procedure:  ICD Generator Changeout;  Surgeon: [name], MD;  Location: [facility];  Service: Cardiovascular;  Laterality: N/A;   MEDIAN STERNOTOMY     TONSILLECTOMY      Current Outpatient Medications  Medication Sig Dispense Refill   amoxicillin (AMOXIL) 500 MG capsule TAKE 4 CAPSULES (2,000 MG TOTAL) BY MOUTH ONCE AS NEEDED (FOR DENTAL PROCEDURES). 4 capsule 3   aspirin EC 81 MG tablet Take 81 mg by mouth daily.     carvedilol (COREG) 12.5 MG tablet TAKE 1 TABLET BY MOUTH TWICE DAILY WITH A MEAL 180 tablet 3   dapagliflozin propanediol (FARXIGA) 10 MG TABS tablet TAKE 1 TABLET BY MOUTH ONCE DAILY 30 tablet 11   dofetilide (TIKOSYN) 250 MCG capsule TAKE 1 CAPSULE BY MOUTH TWICE DAILY 180 capsule 0  furosemide (LASIX) 40 MG tablet TAKE 1 TABLET BY MOUTH DAILY. TAKE AN EXTRA DOSE IF NEEDED. PLEASE MAKE ANNUAL APPT WITH DR. CRENSHAW FOR REFILLS 90 tablet 0   glucose blood (ONETOUCH VERIO) test strip Use to test blood sugars In Vitro once a day 100 each 5   lisinopril (ZESTRIL) 40 MG tablet TAKE 1 TABLET BY MOUTH ONCE DAILY 90 tablet 3   metFORMIN (GLUCOPHAGE) 1000 MG tablet TAKE 1 TABLET BY MOUTH TWICE DAILY 180 tablet 3   spironolactone (ALDACTONE) 25 MG tablet TAKE 1 TABLET BY MOUTH ONCE DAILY 90 tablet 2   Zoster Vaccine Adjuvanted [facility]) injection Inject into the muscle. 0.5 mL 0   albuterol (VENTOLIN HFA) 108 (90 Base) MCG/ACT inhaler INHALE 2 PUFFS BY MOUTH EVERY 6 (SIX) HOURS IF NEEDED FOR WHEEZING OR SHORTNESS OF BREATH. 8.5 g 0    allopurinol (ZYLOPRIM) 300 MG tablet TAKE 1 TABLET BY MOUTH ONCE DAILY (Patient not taking: Reported on 07/09/2020) 90 tablet 1   carvedilol (COREG) 12.5 MG tablet TAKE 1 TABLET BY MOUTH TWICE DAILY WITH A MEAL (Patient not taking: Reported on 04/13/2021) 180 tablet 3   FARXIGA 10 MG TABS tablet Take 10 mg by mouth daily. (Patient not taking: Reported on 04/13/2021)     lisinopril (PRINIVIL,ZESTRIL) 40 MG tablet Take 40 mg by mouth daily. (Patient not taking: Reported on 04/13/2021)     metFORMIN (GLUCOPHAGE) 1000 MG tablet Take 1,000 mg by mouth daily with breakfast.  (Patient not taking: Reported on 04/13/2021)  4   metFORMIN (GLUCOPHAGE) 1000 MG tablet TAKE 1 TABLET BY MOUTH TWICE DAILY 180 tablet 3   spironolactone (ALDACTONE) 25 MG tablet Take 1 tablet by mouth once daily (Patient not taking: Reported on 04/13/2021) 90 tablet 3   No current facility-administered medications for this visit.    No Known Allergies  Review of Systems negative except from HPI and PMH  Physical Exam BP 122/68    Pulse 71    Ht 5\' 10"  (1.778 m)    Wt 208 lb 12.8 oz (94.7 kg)    SpO2 98%    BMI 29.96 kg/m  Well developed and well nourished in no acute distress HENT normal Neck supple with JVP-flat Clear Device pocket well healed; without hematoma or erythema.  There is no tethering  Regular rate and rhythm, no murmur Abd-soft with active BS No Clubbing cyanosis  edema Skin-warm and dry A & Oriented  Grossly normal sensory and motor function  ECG   atrial fibrillation with ventricular pacing upright QRS lead V1 and negative QRS lead I   Assessment and  Plan  Nonischemic cardiomyopathy- resolved/improved he is  Aortic stenosis S./P. bioprosthetic replacement  Implantable defibrillator-CRT-[employer]   Reprogrammed his dynamic AV delay to decrease intrinsic conduction.  Congestive heart.-Chronic mixed   PVCs    Atrial fibrillation-persistent   High Risk Medication Surveillance dofetilide  He  has persistent atrial fibrillation which may or may not be associated with recent loss of energy and dizzy and he is worsening of dizziness.  He is loath to take warfarin, but he is open to taking a DOAC.  We will begin him on apixaban 5 mg twice daily and have reviewed risks and benefits for stroke risk reduction but also bleeding.  We will stop his aspirin and anticipate cardioversion in 3-4 weeks  It has been sometime since his LVEF was measured, I would be inclined towards measuring this again I will discuss with Dr. Stanford Breed for now we will  continue on his spironolactone at 25 lisinopril at 40 and carvedilol 12.5 twice daily is also taking Farxiga at 10 mg a day.        I,Mykaella Javier,acting as a scribe for Virl Axe, MD.,have documented all relevant documentation on the behalf of Virl Axe, MD,as directed by  Virl Axe, MD while in the presence of Virl Axe, MD.  I, Virl Axe, MD, have reviewed all documentation for this visit. The documentation on 04/13/21 for the exam, diagnosis, procedures, and orders are all accurate and complete.

## 2021-04-13 NOTE — Patient Instructions (Signed)
Medication Instructions:  Your physician has recommended you make the following change in your medication:   ** Begin Eliquis 5mg  - 1 tablet by mouth twice daily  ** Stop Aspirin  *If you need a refill on your cardiac medications before your next appointment, please call your pharmacy*   Lab Work:  BMET and Mg today  If you have labs (blood work) drawn today and your tests are completely normal, you will receive your results only by: [place] (if you have MyChart) OR A paper copy in the mail If you have any lab test that is abnormal or we need to change your treatment, we will call you to review the results.   Testing/Procedures: Your physician has recommended that you have a Cardioversion (DCCV). Electrical Cardioversion uses a jolt of electricity to your heart either through paddles or wired patches attached to your chest. This is a controlled, usually prescheduled, procedure. Defibrillation is done under light anesthesia in the hospital, and you usually go home the day of the procedure. This is done to get your heart back into a normal rhythm. You are not awake for the procedure. Please see the instruction sheet given to you today.    Follow-Up: At [facility], you and your health needs are our priority.  As part of our continuing mission to provide you with exceptional heart care, we have created designated Provider Care Teams.  These Care Teams include your primary Cardiologist (physician) and Advanced Practice Providers (APPs -  Physician Assistants and Nurse Practitioners) who all work together to provide you with the care you need, when you need it.  We recommend signing up for the patient portal called "MyChart".  Sign up information is provided on this After Visit Summary.  MyChart is used to connect with patients for Virtual Visits (Telemedicine).  Patients are able to view lab/test results, encounter notes, upcoming appointments, etc.  Non-urgent messages can be sent  to your provider as well.   To learn more about what you can do with MyChart, go to [email or web address].    Your next appointment:   Follow up to be scheduled.    You are scheduled for a Cardioversion on Monday, 05/18/2021 with Dr. Oval Linsey.  Please arrive at the [facility] (Main Entrance A) at [facility]: [address] [place], [place] [address] at 6am.   DIET: Nothing to eat or drink after midnight except a sip of water with medications (see medication instructions below)  FYI: For your safety, and to allow [place] to monitor your vital signs accurately during the surgery/procedure we request that   if you have artificial nails, gel coating, SNS etc. Please have those removed prior to your surgery/procedure. Not having the nail coverings /polish removed may result in cancellation or delay of your surgery/procedure.   Medication Instructions: Hold your Furosemide, Metformin, Spironolactone the morning of your procedure.  You may take your other medications with enough water to get them down safely.  Continue your anticoagulant: Eliquis You will need to continue your anticoagulant after your procedure until you  are told by your Provider that it is safe to stop   Labs: You will have your labs drawn at the hospital the morning of your cardioversion. You must have a responsible person to drive you home and stay in the waiting area during your procedure. Failure to do so could result in cancellation.  Bring your insurance cards.  *Special Note: Every effort is made to have your procedure done on  time. Occasionally there are emergencies that occur at the hospital that may cause delays. Please be patient if a delay does occur.

## 2021-04-16 ENCOUNTER — Other Ambulatory Visit (HOSPITAL_BASED_OUTPATIENT_CLINIC_OR_DEPARTMENT_OTHER): Payer: Self-pay

## 2021-04-16 LAB — CUP PACEART INCLINIC DEVICE CHECK
Date Time Interrogation Session: 20230109000000
HighPow Impedance: 49 Ohm
Implantable Lead Implant Date: 20111201
Implantable Lead Implant Date: 20111201
Implantable Lead Implant Date: 20111201
Implantable Lead Location: 753858
Implantable Lead Location: 753859
Implantable Lead Location: 753860
Implantable Lead Model: 158
Implantable Lead Model: 4396
Implantable Lead Model: 5076
Implantable Lead Serial Number: 302703
Implantable Pulse Generator Implant Date: 20160817
Lead Channel Impedance Value: 1039 Ohm
Lead Channel Impedance Value: 617 Ohm
Lead Channel Impedance Value: 779 Ohm
Lead Channel Pacing Threshold Amplitude: 0.7 V
Lead Channel Pacing Threshold Amplitude: 2 V
Lead Channel Pacing Threshold Pulse Width: 0.4 ms
Lead Channel Pacing Threshold Pulse Width: 1.5 ms
Lead Channel Sensing Intrinsic Amplitude: 12.3 mV
Lead Channel Sensing Intrinsic Amplitude: 2.5 mV
Lead Channel Sensing Intrinsic Amplitude: 25 mV
Lead Channel Setting Pacing Amplitude: 2.2 V
Lead Channel Setting Pacing Amplitude: 2.4 V
Lead Channel Setting Pacing Amplitude: 2.4 V
Lead Channel Setting Pacing Pulse Width: 0.4 ms
Lead Channel Setting Pacing Pulse Width: 1.5 ms
Lead Channel Setting Sensing Sensitivity: 0.5 mV
Lead Channel Setting Sensing Sensitivity: 1 mV
Pulse Gen Serial Number: 113381

## 2021-04-17 ENCOUNTER — Other Ambulatory Visit (HOSPITAL_BASED_OUTPATIENT_CLINIC_OR_DEPARTMENT_OTHER): Payer: Self-pay

## 2021-04-17 MED ORDER — FARXIGA 10 MG PO TABS
10.0000 mg | ORAL_TABLET | Freq: Every day | ORAL | 11 refills | Status: DC
  Filled 2021-04-17: qty 30, 30d supply, fill #0
  Filled 2021-05-18: qty 30, 30d supply, fill #1

## 2021-04-30 ENCOUNTER — Ambulatory Visit (INDEPENDENT_AMBULATORY_CARE_PROVIDER_SITE_OTHER): Payer: Medicare Other

## 2021-04-30 DIAGNOSIS — I428 Other cardiomyopathies: Secondary | ICD-10-CM

## 2021-04-30 LAB — CUP PACEART REMOTE DEVICE CHECK
Battery Remaining Longevity: 36 mo
Battery Remaining Percentage: 53 %
Brady Statistic RA Percent Paced: 0 %
Brady Statistic RV Percent Paced: 59 %
Date Time Interrogation Session: 20230126041100
HighPow Impedance: 45 Ohm
Implantable Lead Implant Date: 20111201
Implantable Lead Implant Date: 20111201
Implantable Lead Implant Date: 20111201
Implantable Lead Location: 753858
Implantable Lead Location: 753859
Implantable Lead Location: 753860
Implantable Lead Model: 158
Implantable Lead Model: 4396
Implantable Lead Model: 5076
Implantable Lead Serial Number: 302703
Implantable Pulse Generator Implant Date: 20160817
Lead Channel Impedance Value: 597 Ohm
Lead Channel Impedance Value: 706 Ohm
Lead Channel Impedance Value: 925 Ohm
Lead Channel Pacing Threshold Amplitude: 0.7 V
Lead Channel Pacing Threshold Amplitude: 1.1 V
Lead Channel Pacing Threshold Amplitude: 2 V
Lead Channel Pacing Threshold Pulse Width: 0.4 ms
Lead Channel Pacing Threshold Pulse Width: 0.4 ms
Lead Channel Pacing Threshold Pulse Width: 1.5 ms
Lead Channel Setting Pacing Amplitude: 2.2 V
Lead Channel Setting Pacing Amplitude: 2.4 V
Lead Channel Setting Pacing Amplitude: 2.4 V
Lead Channel Setting Pacing Pulse Width: 0.4 ms
Lead Channel Setting Pacing Pulse Width: 1.5 ms
Lead Channel Setting Sensing Sensitivity: 0.5 mV
Lead Channel Setting Sensing Sensitivity: 1 mV
Pulse Gen Serial Number: 113381

## 2021-05-04 ENCOUNTER — Other Ambulatory Visit (HOSPITAL_BASED_OUTPATIENT_CLINIC_OR_DEPARTMENT_OTHER): Payer: Self-pay

## 2021-05-08 ENCOUNTER — Encounter (HOSPITAL_COMMUNITY): Payer: Self-pay | Admitting: Cardiovascular Disease

## 2021-05-11 ENCOUNTER — Other Ambulatory Visit (HOSPITAL_BASED_OUTPATIENT_CLINIC_OR_DEPARTMENT_OTHER): Payer: Self-pay

## 2021-05-11 NOTE — Progress Notes (Signed)
Remote ICD transmission.   

## 2021-05-18 ENCOUNTER — Other Ambulatory Visit: Payer: Self-pay

## 2021-05-18 ENCOUNTER — Other Ambulatory Visit (HOSPITAL_BASED_OUTPATIENT_CLINIC_OR_DEPARTMENT_OTHER): Payer: Self-pay

## 2021-05-18 ENCOUNTER — Ambulatory Visit (HOSPITAL_BASED_OUTPATIENT_CLINIC_OR_DEPARTMENT_OTHER): Payer: Medicare Other | Admitting: Certified Registered Nurse Anesthetist

## 2021-05-18 ENCOUNTER — Encounter (HOSPITAL_COMMUNITY)
Admission: RE | Disposition: A | Discharge status: Home / Self Care | Payer: Self-pay | Source: Home / Self Care | Attending: Cardiovascular Disease

## 2021-05-18 ENCOUNTER — Ambulatory Visit (HOSPITAL_COMMUNITY)
Admission: RE | Admit: 2021-05-18 | Discharge: 2021-05-18 | Disposition: A | Discharge status: Home / Self Care | Payer: Medicare Other | Attending: Cardiovascular Disease | Admitting: Cardiovascular Disease

## 2021-05-18 ENCOUNTER — Encounter (HOSPITAL_COMMUNITY): Payer: Self-pay | Admitting: Cardiovascular Disease

## 2021-05-18 ENCOUNTER — Ambulatory Visit (HOSPITAL_COMMUNITY): Payer: Medicare Other | Admitting: Certified Registered Nurse Anesthetist

## 2021-05-18 DIAGNOSIS — I428 Other cardiomyopathies: Secondary | ICD-10-CM | POA: Insufficient documentation

## 2021-05-18 DIAGNOSIS — I10 Essential (primary) hypertension: Secondary | ICD-10-CM | POA: Diagnosis not present

## 2021-05-18 DIAGNOSIS — M1990 Unspecified osteoarthritis, unspecified site: Secondary | ICD-10-CM

## 2021-05-18 DIAGNOSIS — I4892 Unspecified atrial flutter: Secondary | ICD-10-CM | POA: Diagnosis present

## 2021-05-18 DIAGNOSIS — Z79899 Other long term (current) drug therapy: Secondary | ICD-10-CM | POA: Diagnosis not present

## 2021-05-18 DIAGNOSIS — I4819 Other persistent atrial fibrillation: Secondary | ICD-10-CM | POA: Diagnosis not present

## 2021-05-18 DIAGNOSIS — Z953 Presence of xenogenic heart valve: Secondary | ICD-10-CM | POA: Insufficient documentation

## 2021-05-18 DIAGNOSIS — Z7982 Long term (current) use of aspirin: Secondary | ICD-10-CM | POA: Insufficient documentation

## 2021-05-18 DIAGNOSIS — E119 Type 2 diabetes mellitus without complications: Secondary | ICD-10-CM | POA: Diagnosis not present

## 2021-05-18 DIAGNOSIS — I483 Typical atrial flutter: Secondary | ICD-10-CM

## 2021-05-18 DIAGNOSIS — I7121 Aneurysm of the ascending aorta, without rupture: Secondary | ICD-10-CM | POA: Diagnosis not present

## 2021-05-18 DIAGNOSIS — Z95810 Presence of automatic (implantable) cardiac defibrillator: Secondary | ICD-10-CM | POA: Insufficient documentation

## 2021-05-18 DIAGNOSIS — I110 Hypertensive heart disease with heart failure: Secondary | ICD-10-CM | POA: Insufficient documentation

## 2021-05-18 DIAGNOSIS — I5042 Chronic combined systolic (congestive) and diastolic (congestive) heart failure: Secondary | ICD-10-CM | POA: Diagnosis not present

## 2021-05-18 DIAGNOSIS — I493 Ventricular premature depolarization: Secondary | ICD-10-CM | POA: Insufficient documentation

## 2021-05-18 HISTORY — PX: CARDIOVERSION: SHX1299

## 2021-05-18 LAB — POCT I-STAT, CHEM 8
BUN: 32 mg/dL — ABNORMAL HIGH (ref 8–23)
Calcium, Ion: 1.2 mmol/L (ref 1.15–1.40)
Chloride: 104 mmol/L (ref 98–111)
Creatinine, Ser: 1.2 mg/dL (ref 0.61–1.24)
Glucose, Bld: 204 mg/dL — ABNORMAL HIGH (ref 70–99)
HCT: 41 % (ref 39.0–52.0)
Hemoglobin: 13.9 g/dL (ref 13.0–17.0)
Potassium: 4.6 mmol/L (ref 3.5–5.1)
Sodium: 140 mmol/L (ref 135–145)
TCO2: 27 mmol/L (ref 22–32)

## 2021-05-18 LAB — GLUCOSE, CAPILLARY: Glucose-Capillary: 196 mg/dL — ABNORMAL HIGH (ref 70–99)

## 2021-05-18 SURGERY — CARDIOVERSION
Anesthesia: General

## 2021-05-18 MED ORDER — SODIUM CHLORIDE 0.9 % IV SOLN: INTRAVENOUS | Status: DC

## 2021-05-18 MED ORDER — PROPOFOL 10 MG/ML IV BOLUS
INTRAVENOUS | Status: DC | PRN
  Administered 2021-05-18: 40 mg via INTRAVENOUS

## 2021-05-18 NOTE — Anesthesia Postprocedure Evaluation (Addendum)
Anesthesia Post Note  Patient: [name]  Procedure(s) Performed: CARDIOVERSION     Patient location during evaluation: Endoscopy Anesthesia Type: General Level of consciousness: awake and alert Pain management: pain level controlled Vital Signs Assessment: post-procedure vital signs reviewed and stable Respiratory status: spontaneous breathing, nonlabored ventilation, respiratory function stable and patient connected to nasal cannula oxygen Cardiovascular status: blood pressure returned to baseline and stable Postop Assessment: no apparent nausea or vomiting Anesthetic complications: no   No notable events documented.  Last Vitals:  Vitals:   05/18/21 0810 05/18/21 0820  BP: 114/66 115/71  Pulse: 74 74  Resp: 15 11  Temp:    SpO2: 97% 96%    Last Pain:  Vitals:   05/18/21 0820  TempSrc:   PainSc: 0-No pain                 Kayo Zion

## 2021-05-18 NOTE — Transfer of Care (Signed)
Immediate Anesthesia Transfer of Care Note  Patient: [name]  Procedure(s) Performed: CARDIOVERSION  Patient Location: PACU and Endoscopy Unit  Anesthesia Type:General  Level of Consciousness: drowsy and patient cooperative  Airway & Oxygen Therapy: Patient Spontanous Breathing  Post-op Assessment: Report given to RN and Post -op Vital signs reviewed and stable  Post vital signs: Reviewed and stable  Last Vitals:  Vitals Value Taken Time  BP    Temp    Pulse    Resp    SpO2      Last Pain:  Vitals:   05/18/21 0659  TempSrc: Oral  PainSc: 0-No pain         Complications: No notable events documented.

## 2021-05-18 NOTE — Anesthesia Preprocedure Evaluation (Signed)
Anesthesia Evaluation  Patient identified by MRN, date of birth, ID band Patient awake    Reviewed: Allergy & Precautions, NPO status , Patient's Chart, lab work & pertinent test results  History of Anesthesia Complications Negative for: history of anesthetic complications  Airway Mallampati: II  TM Distance: >3 FB Neck ROM: Full    Dental  (+) Dental Advisory Given, Teeth Intact   Pulmonary neg shortness of breath, neg sleep apnea, neg COPD, neg recent URI, former smoker,    breath sounds clear to auscultation       Cardiovascular hypertension, Pt. on medications and Pt. on home beta blockers (-) angina(-) Past MI + dysrhythmias Atrial Fibrillation + pacemaker  Rhythm:Irregular  2009 AVR  1. Left ventricular ejection fraction, by estimation, is 45 to 50% (was  normal in 2016). The left ventricle has mildly decreased function. The  left ventricle demonstrates regional wall motion abnormalities (see  scoring diagram/findings for description).  2. Right ventricular systolic function is normal. The right ventricular  size is normal. There is normal pulmonary artery systolic pressure.  3. The mitral valve is normal in structure and function. Mild mitral  valve regurgitation. No evidence of mitral stenosis.  4. Aortic valve regurgitation is not visualized. Mild aortic valve  stenosis. There is a 25 mm Edwards bioprostetic valve present in the  aortic position. Lower gradient noted n this study - 15/10 mmHg ( was  21/12 mmHg in 2016) which can be related to  mildly reduced LVEF.  5. There is moderate dilatation of the ascending aorta measuring 43 mm (  was 38 mm in 2016).  6. The inferior vena cava is normal in size with greater than 50%  respiratory variability, suggesting right atrial pressure of 3 mmHg.   FINDINGS    Neuro/Psych negative neurological ROS  negative psych ROS   GI/Hepatic Neg liver ROS,   Endo/Other   diabetes  Renal/GU Renal InsufficiencyRenal diseaseLab Results      Component                Value               Date                      CREATININE               1.32 (H)            04/13/2021                Musculoskeletal  (+) Arthritis ,   Abdominal   Peds  Hematology  (+) Blood dyscrasia, , eliquis  Lab Results      Component                Value               Date                      WBC                      8.0                 01/03/2019                HGB                      15.4  01/03/2019                HCT                      44.9                01/03/2019                MCV                      88                  01/03/2019                PLT                      211                 01/03/2019              Anesthesia Other Findings   Reproductive/Obstetrics                             Anesthesia Physical Anesthesia Plan  ASA: 3  Anesthesia Plan: General   Post-op Pain Management: Minimal or no pain anticipated   Induction: Intravenous  PONV Risk Score and Plan: 2 and Treatment may vary due to age or medical condition  Airway Management Planned: Mask and Nasal Cannula  Additional Equipment: None  Intra-op Plan:   Post-operative Plan:   Informed Consent: I have reviewed the patients History and Physical, chart, labs and discussed the procedure including the risks, benefits and alternatives for the proposed anesthesia with the patient or authorized representative who has indicated his/her understanding and acceptance.     Dental advisory given  Plan Discussed with: CRNA and Anesthesiologist  Anesthesia Plan Comments:         Anesthesia Quick Evaluation

## 2021-05-18 NOTE — CV Procedure (Signed)
Electrical Cardioversion Procedure Note [name] [id number] February 11, 1941  Procedure: Electrical Cardioversion Indications:  Atrial Flutter  Procedure Details Consent: Risks of procedure as well as the alternatives and risks of each were explained to the (patient/caregiver).  Consent for procedure obtained. Time Out: Verified patient identification, verified procedure, site/side was marked, verified correct patient position, special equipment/implants available, medications/allergies/relevent history reviewed, required imaging and test results available.  Performed  Patient placed on cardiac monitor, pulse oximetry, supplemental oxygen as necessary.  Sedation given:  propofol Pacer pads placed anterior and posterior chest.  Cardioverted 1 time(s).  Cardioverted at 150J.  Evaluation Findings: Post procedure EKG shows:  APVP Complications: None Patient did tolerate procedure well.   Skeet Latch, MD 05/18/2021, 7:59 AM

## 2021-05-18 NOTE — H&P (Signed)
Per last clinic note.  No changes.        Patient Care Team: [name], [name], MD as PCP - General [name], MD as PCP - Electrophysiology (Cardiology)   HPI  [name] is a 81 y.o. male Seen in followup for CRT-D implantation in the fall 2011 for nonischemic cardiomyopathy. He underwent device replacement 8/16 He also had a history of atrial flutter.  2009 had undertoneporcine aortic valve replacement with 25 mm Carpentier-Edwards valve, replacement of ascending aortic aneurysm with 38-mm Hemishield graft, as well as Cox maze procedure for atrial fibrillation.    He takes dofetilide; previously anticoagulation but now on ASA Atrial fibrillation has been persistent since mid June 2022  TERF include age, CAD DM CHF for CHADSVASc> 5   Today, he is doing well. 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 However, since his last visit, he feels more physically limited which he relates to old age.  His balance has been worsening. He no longer boats or fishes on his own because of his poor balance.  A few times a week, he enjoys wine or beer with dinner. He is hesitant to take Coumadin because of risk of bleeding and he does not want to further limit his ability to work outside. However, he is open to trying alternative blood thinners.  The patient denies chest pain, shortness of breath, nocturnal dyspnea, orthopnea or peripheral edema.  There have been no palpitations or syncope.  Complains of worsening balance and dizziness.  DATE TEST EF   4/09 LHC  20 %   8//14 Echo   50 %   12/16 Echo  60-65% Mean A Grad 12  2/21 Echo 45-50 %    Aortic arch was reviewed by CT scanning 2/19 4.1 cm  Date Cr K Mg Hgb  2/19 1.14 5.0>>5.1 2.8 14.9 (4/19)  2/20 1.08 4.5 2.2 15.6  3/22 1.21 5.1 2.4             Past Medical History:  Diagnosis Date   Aortic stenosis/ bicuspid valve--s/p AVR    Ascending aortic aneurysm    Atrial fibrillation/flutter     Biventricular implantable cardiac defibrillator- BSX    Cholelithiases    Diabetes mellitus without complication (HCC)    DJD (degenerative joint disease)    Hypertension    LBBB (left bundle branch block)    Non-ischemic cardiomyopathy (HCC)    OSA (obstructive sleep apnea)     Past Surgical History:  Procedure Laterality Date   AORTIC VALVE REPLACEMENT     APPENDECTOMY     CERVICAL DISCECTOMY  5/01   C7   CHOLECYSTECTOMY     EP IMPLANTABLE DEVICE N/A 11/20/2014   Procedure:  ICD Generator Changeout;  Surgeon: [name], MD;  Location: [place] [facility];  Service: Cardiovascular;  Laterality: N/A;   MEDIAN STERNOTOMY     TONSILLECTOMY      Current Facility-Administered Medications  Medication Dose Route Frequency Provider Last Rate Last Admin   0.9 %  sodium chloride infusion   Intravenous Continuous Skeet Latch, MD 10 mL/hr at 05/18/21 0750 Restarted at 05/18/21 0751    No Known Allergies  Review of Systems negative except from HPI and PMH  Physical Exam BP 120/68    Pulse 70    Temp 97.9 F (36.6 C) (Oral)    Resp 16    Ht 5\' 10"  (1.778 m)    Wt 90.7 kg  SpO2 97%    BMI 28.70 kg/m  Well developed and well nourished in no acute distress HENT normal Neck supple with JVP-flat Clear Device pocket well healed; without hematoma or erythema.  There is no tethering  Regular rate and rhythm, no murmur Abd-soft with active BS No Clubbing cyanosis  edema Skin-warm and dry A & Oriented  Grossly normal sensory and motor function  ECG   atrial fibrillation with ventricular pacing upright QRS lead V1 and negative QRS lead I   Assessment and  Plan  Nonischemic cardiomyopathy- resolved/improved he is  Aortic stenosis S./P. bioprosthetic replacement  Implantable defibrillator-CRT-[employer]   Reprogrammed his dynamic AV delay to decrease intrinsic conduction.  Congestive heart.-Chronic mixed   PVCs    Atrial fibrillation-persistent   High Risk  Medication Surveillance dofetilide  He has persistent atrial fibrillation which may or may not be associated with recent loss of energy and dizzy and he is worsening of dizziness.  He is loath to take warfarin, but he is open to taking a DOAC.  We will begin him on apixaban 5 mg twice daily and have reviewed risks and benefits for stroke risk reduction but also bleeding.  We will stop his aspirin and anticipate cardioversion in 3-4 weeks  It has been sometime since his LVEF was measured, I would be inclined towards measuring this again I will discuss with Dr. Stanford Breed for now we will continue on his spironolactone at 25 lisinopril at 40 and carvedilol 12.5 twice daily is also taking Farxiga at 10 mg a day.      Dazja Houchin C. Oval Linsey, MD, [facility] 05/18/2021 7:51 AM

## 2021-05-19 ENCOUNTER — Encounter (HOSPITAL_COMMUNITY): Payer: Self-pay | Admitting: Cardiovascular Disease

## 2021-05-26 ENCOUNTER — Other Ambulatory Visit (HOSPITAL_COMMUNITY): Payer: Self-pay

## 2021-06-02 ENCOUNTER — Other Ambulatory Visit (HOSPITAL_BASED_OUTPATIENT_CLINIC_OR_DEPARTMENT_OTHER): Payer: Self-pay

## 2021-06-04 ENCOUNTER — Other Ambulatory Visit (HOSPITAL_BASED_OUTPATIENT_CLINIC_OR_DEPARTMENT_OTHER): Payer: Self-pay

## 2021-06-11 ENCOUNTER — Other Ambulatory Visit (HOSPITAL_BASED_OUTPATIENT_CLINIC_OR_DEPARTMENT_OTHER): Payer: Self-pay

## 2021-06-11 DIAGNOSIS — I447 Left bundle-branch block, unspecified: Secondary | ICD-10-CM | POA: Insufficient documentation

## 2021-06-11 MED ORDER — FARXIGA 10 MG PO TABS
ORAL_TABLET | ORAL | 2 refills | Status: DC
  Filled 2021-06-11: qty 90, 90d supply, fill #0
  Filled 2021-08-16 – 2021-09-01 (×4): qty 90, 90d supply, fill #1
  Filled 2021-12-12: qty 90, 90d supply, fill #2

## 2021-06-12 ENCOUNTER — Other Ambulatory Visit (HOSPITAL_BASED_OUTPATIENT_CLINIC_OR_DEPARTMENT_OTHER): Payer: Self-pay

## 2021-06-15 ENCOUNTER — Other Ambulatory Visit: Payer: Self-pay

## 2021-06-15 ENCOUNTER — Ambulatory Visit: Payer: Medicare Other | Admitting: Internal Medicine

## 2021-06-15 ENCOUNTER — Other Ambulatory Visit (HOSPITAL_BASED_OUTPATIENT_CLINIC_OR_DEPARTMENT_OTHER): Payer: Self-pay

## 2021-06-15 ENCOUNTER — Encounter: Payer: Self-pay | Admitting: Internal Medicine

## 2021-06-15 VITALS — BP 126/72 | HR 75 | Ht 70.0 in | Wt 206.6 lb

## 2021-06-15 DIAGNOSIS — I4819 Other persistent atrial fibrillation: Secondary | ICD-10-CM | POA: Diagnosis not present

## 2021-06-15 DIAGNOSIS — I447 Left bundle-branch block, unspecified: Secondary | ICD-10-CM | POA: Diagnosis not present

## 2021-06-15 DIAGNOSIS — I5042 Chronic combined systolic (congestive) and diastolic (congestive) heart failure: Secondary | ICD-10-CM | POA: Diagnosis not present

## 2021-06-15 DIAGNOSIS — I428 Other cardiomyopathies: Secondary | ICD-10-CM

## 2021-06-15 NOTE — Patient Instructions (Signed)

## 2021-06-15 NOTE — Progress Notes (Signed)
? ? ? ? ?Patient Care Team: ?[name], [name], MD as PCP - General ?[name], MD as PCP - Electrophysiology (Cardiology) ? ? ?HPI ? ?[name] is a 81 y.o. male ?Seen in followup for CRT-D implantation in the fall 2011 for nonischemic cardiomyopathy. He underwent device replacement 8/16 history of atrial flutter; 1/23 found to be in persistent since 6/22 atrial fibrillation managed with dofetilide, having stopped his anticoagulation and then this was resumed   ? ?2009 porcine aortic valve replacement with 25 mm Carpentier-Edwards valve, replacement of ascending aortic aneurysm with 38-mm Hemishield graft, as well as Cox maze procedure for atrial fibrillation.   ?  ?The patient denies chest pain, nocturnal dyspnea, orthopnea or peripheral edema.  There have been no palpitations, lightheadedness or syncope.  Complains of mild dyspnea on exertion but this is much improved following cardioversion; also more energy.  ? ?On Eliquis and no bleeding. ? ?DATE TEST EF   ?4/09 LHC  20 % No obstructive CAD  ?8//14 Echo   50 %   ?12/16 Echo  60-65% Mean A Grad 12  ?2/21 Echo 45-50 %   ?4/22 CTA  Ao 40  mm  ?  ?Today ?Aortic arch was reviewed by CT scanning 2/19 4.1 cm ? ?Date Cr K Mg Hgb  ?2/19 1.14 5.0>>5.1 2.8 14.9 (4/19)  ?2/20 1.08 4.5 2.2 15.6  ?3/22 1.21 5.1 2.4   ?2/23 1.20 4.6 2.5 13.9  ?      ?      ?   ? Thromboembolic risk factors ( age  -2, HTN-1, DM-1, CHF-1 ) for a CHADSVASc Score of >=5 ? ? ?  ? ?Past Medical History:  ?Diagnosis Date  ? Aortic stenosis/ bicuspid valve--s/p AVR   ? Ascending aortic aneurysm   ? Atrial fibrillation/flutter   ? Biventricular implantable cardiac defibrillator- BSX   ? Cholelithiases   ? Diabetes mellitus without complication ([place])   ? DJD (degenerative joint disease)   ? Hypertension   ? LBBB (left bundle branch block)   ? Non-ischemic cardiomyopathy ([place])   ? OSA (obstructive sleep apnea)   ? ? ?Past Surgical History:  ?Procedure Laterality Date  ? AORTIC VALVE  REPLACEMENT    ? APPENDECTOMY    ? CARDIOVERSION N/A 05/18/2021  ? Procedure: CARDIOVERSION;  Surgeon: Skeet Latch, MD;  Location: [facility];  Service: Cardiovascular;  Laterality: N/A;  ? CERVICAL DISCECTOMY  5/01  ? C7  ? CHOLECYSTECTOMY    ? EP IMPLANTABLE DEVICE N/A 11/20/2014  ? Procedure:  ICD Generator Changeout;  Surgeon: [name], MD;  Location: [place] [facility];  Service: Cardiovascular;  Laterality: N/A;  ? MEDIAN STERNOTOMY    ? TONSILLECTOMY    ? ? ?Current Outpatient Medications  ?Medication Sig Dispense Refill  ? amoxicillin (AMOXIL) 500 MG capsule TAKE 4 CAPSULES (2,000 MG TOTAL) BY MOUTH ONCE AS NEEDED (FOR DENTAL PROCEDURES). 4 capsule 3  ? amoxicillin (AMOXIL) 500 MG capsule 4 tabs    ? apixaban (ELIQUIS) 5 MG TABS tablet Take 1 tablet (5 mg total) by mouth 2 (two) times daily. 180 tablet 3  ? Blood Glucose Monitoring Suppl (ONETOUCH VERIO FLEX SYSTEM) w/Device KIT use to test blood sugars    ? carvedilol (COREG) 12.5 MG tablet Take 12.5 mg by mouth 2 (two) times daily with a meal.    ? dapagliflozin propanediol (FARXIGA) 10 MG TABS tablet Take 1 tablet by mouth once daily 90 tablet 2  ? dapagliflozin  propanediol (FARXIGA) 10 MG TABS tablet 1 tablet    ? dofetilide (TIKOSYN) 250 MCG capsule TAKE 1 CAPSULE BY MOUTH TWICE DAILY 180 capsule 0  ? furosemide (LASIX) 20 MG tablet Take 1 tablet by mouth daily.    ? glucose blood (ONETOUCH VERIO) test strip Use to test blood sugars In Vitro once a day 100 each 5  ? lisinopril (ZESTRIL) 40 MG tablet TAKE 1 TABLET BY MOUTH ONCE DAILY 90 tablet 3  ? metFORMIN (GLUCOPHAGE) 1000 MG tablet TAKE 1 TABLET BY MOUTH TWICE DAILY 180 tablet 3  ? Mupirocin 2 % KIT apply to affected area BID until resolved    ? spironolactone (ALDACTONE) 25 MG tablet TAKE 1 TABLET BY MOUTH ONCE DAILY 90 tablet 2  ? Zoster Vaccine Adjuvanted [facility]) injection Inject into the muscle. 0.5 mL 0  ? ?No current facility-administered medications for this visit.  ? ? ?No  Known Allergies ? ?Review of Systems negative except from HPI and PMH ? ?Physical Exam ?BP 126/72   Pulse 75   Ht '5\' 10"'  (1.778 m)   Wt 206 lb 9.6 oz (93.7 kg)   SpO2 96%   BMI 29.64 kg/m?  ?Well developed and nourished in no acute distress ?HENT normal ?Neck supple  ?Device pocket well healed; without hematoma or erythema.  There is no tethering  ?Clear ?Regular rate and rhythm, no murmurs or gallops ?Abd-soft  ?No Clubbing cyanosis edema ?Skin-warm and dry ?A & Oriented  Grossly normal sensory and motor function ? ?ECG atrial pacing at 75 ?Intervals 13/15/48 ?Upright QRS lead V1 negative QRS lead I ? ? ? ?Assessment and  Plan ? ?Nonischemic cardiomyopathy- resolved/improved   ? ?Aortic stenosis S./P. bioprosthetic replacement ? ?Implantable defibrillator-CRT-[employer]   ? ?Hypertension ? ?Congestive heart.-Chronic mixed ?  ?PVCs   ? ?Atrial fibrillation-persistent  ? ?High Risk Medication Surveillance dofetilide ? ?Persistent atrial fibrillation now in sinus rhythm.  Feels considerably better.  Tolerating also the switch from warfarin--apixaban will continue to 5 mg twice daily.  Surveillance laboratories for the dofetilide are in range.  We will continue this at 250 mcg twice daily. ? ?Euvolemic.  We will continue furosemide 20 mg a day ? ?Blood pressure is well controlled.  Continue him on lisinopril 40 and spironolactone 25 ?   ? ?  ? ?

## 2021-06-16 ENCOUNTER — Other Ambulatory Visit (HOSPITAL_BASED_OUTPATIENT_CLINIC_OR_DEPARTMENT_OTHER): Payer: Self-pay

## 2021-06-16 ENCOUNTER — Other Ambulatory Visit: Payer: Self-pay | Admitting: *Deleted

## 2021-06-16 MED ORDER — CARVEDILOL 12.5 MG PO TABS
12.5000 mg | ORAL_TABLET | Freq: Two times a day (BID) | ORAL | 3 refills | Status: DC
  Filled 2021-06-16: qty 180, 90d supply, fill #0
  Filled 2021-09-27: qty 180, 90d supply, fill #1
  Filled 2022-01-02: qty 180, 90d supply, fill #2
  Filled 2022-04-10: qty 180, 90d supply, fill #3

## 2021-06-21 ENCOUNTER — Other Ambulatory Visit: Payer: Self-pay | Admitting: Internal Medicine

## 2021-06-22 ENCOUNTER — Other Ambulatory Visit (HOSPITAL_BASED_OUTPATIENT_CLINIC_OR_DEPARTMENT_OTHER): Payer: Self-pay

## 2021-06-22 MED ORDER — DOFETILIDE 250 MCG PO CAPS
250.0000 ug | ORAL_CAPSULE | Freq: Two times a day (BID) | ORAL | 3 refills | Status: DC
  Filled 2021-06-22: qty 180, 90d supply, fill #0
  Filled 2021-09-27: qty 180, 90d supply, fill #1
  Filled 2022-01-02: qty 180, 90d supply, fill #2
  Filled 2022-04-10: qty 180, 90d supply, fill #3

## 2021-06-24 ENCOUNTER — Encounter: Payer: Self-pay | Admitting: *Deleted

## 2021-06-24 DIAGNOSIS — I7121 Aneurysm of the ascending aorta, without rupture: Secondary | ICD-10-CM

## 2021-07-03 ENCOUNTER — Telehealth (HOSPITAL_BASED_OUTPATIENT_CLINIC_OR_DEPARTMENT_OTHER): Payer: Self-pay

## 2021-07-20 ENCOUNTER — Other Ambulatory Visit: Payer: Self-pay | Admitting: Cardiology

## 2021-07-20 ENCOUNTER — Other Ambulatory Visit (HOSPITAL_BASED_OUTPATIENT_CLINIC_OR_DEPARTMENT_OTHER): Payer: Self-pay

## 2021-07-21 ENCOUNTER — Other Ambulatory Visit (HOSPITAL_BASED_OUTPATIENT_CLINIC_OR_DEPARTMENT_OTHER): Payer: Self-pay

## 2021-07-21 MED ORDER — FUROSEMIDE 40 MG PO TABS
ORAL_TABLET | ORAL | 3 refills | Status: DC
  Filled 2021-07-21: qty 90, 45d supply, fill #0
  Filled 2021-11-21: qty 90, 45d supply, fill #1
  Filled 2022-02-27: qty 90, 45d supply, fill #2
  Filled 2022-05-21 – 2022-05-24 (×2): qty 90, 45d supply, fill #3

## 2021-07-23 ENCOUNTER — Other Ambulatory Visit (HOSPITAL_BASED_OUTPATIENT_CLINIC_OR_DEPARTMENT_OTHER): Payer: Self-pay

## 2021-07-30 ENCOUNTER — Ambulatory Visit (INDEPENDENT_AMBULATORY_CARE_PROVIDER_SITE_OTHER): Payer: Medicare Other

## 2021-07-30 DIAGNOSIS — I4819 Other persistent atrial fibrillation: Secondary | ICD-10-CM

## 2021-07-30 LAB — CUP PACEART REMOTE DEVICE CHECK
Battery Remaining Longevity: 36 mo
Battery Remaining Percentage: 54 %
Brady Statistic RA Percent Paced: 87 %
Brady Statistic RV Percent Paced: 93 %
Date Time Interrogation Session: 20230427041100
HighPow Impedance: 47 Ohm
Implantable Lead Implant Date: 20111201
Implantable Lead Implant Date: 20111201
Implantable Lead Implant Date: 20111201
Implantable Lead Location: 753858
Implantable Lead Location: 753859
Implantable Lead Location: 753860
Implantable Lead Model: 158
Implantable Lead Model: 4396
Implantable Lead Model: 5076
Implantable Lead Serial Number: 302703
Implantable Pulse Generator Implant Date: 20160817
Lead Channel Impedance Value: 589 Ohm
Lead Channel Impedance Value: 701 Ohm
Lead Channel Impedance Value: 967 Ohm
Lead Channel Pacing Threshold Amplitude: 0.7 V
Lead Channel Pacing Threshold Amplitude: 1.1 V
Lead Channel Pacing Threshold Amplitude: 1.9 V
Lead Channel Pacing Threshold Pulse Width: 0.4 ms
Lead Channel Pacing Threshold Pulse Width: 0.4 ms
Lead Channel Pacing Threshold Pulse Width: 1.5 ms
Lead Channel Setting Pacing Amplitude: 2.2 V
Lead Channel Setting Pacing Amplitude: 2.4 V
Lead Channel Setting Pacing Amplitude: 2.4 V
Lead Channel Setting Pacing Pulse Width: 0.4 ms
Lead Channel Setting Pacing Pulse Width: 1.5 ms
Lead Channel Setting Sensing Sensitivity: 0.5 mV
Lead Channel Setting Sensing Sensitivity: 1 mV
Pulse Gen Serial Number: 113381

## 2021-08-10 ENCOUNTER — Other Ambulatory Visit (HOSPITAL_BASED_OUTPATIENT_CLINIC_OR_DEPARTMENT_OTHER): Payer: Self-pay

## 2021-08-14 NOTE — Progress Notes (Signed)
Remote ICD transmission.   

## 2021-08-17 ENCOUNTER — Other Ambulatory Visit (HOSPITAL_BASED_OUTPATIENT_CLINIC_OR_DEPARTMENT_OTHER): Payer: Self-pay

## 2021-08-18 ENCOUNTER — Other Ambulatory Visit (HOSPITAL_BASED_OUTPATIENT_CLINIC_OR_DEPARTMENT_OTHER): Payer: Self-pay

## 2021-08-24 ENCOUNTER — Other Ambulatory Visit (HOSPITAL_BASED_OUTPATIENT_CLINIC_OR_DEPARTMENT_OTHER): Payer: Self-pay

## 2021-08-27 ENCOUNTER — Other Ambulatory Visit (HOSPITAL_BASED_OUTPATIENT_CLINIC_OR_DEPARTMENT_OTHER): Payer: Self-pay

## 2021-08-28 ENCOUNTER — Other Ambulatory Visit (HOSPITAL_BASED_OUTPATIENT_CLINIC_OR_DEPARTMENT_OTHER): Payer: Self-pay

## 2021-09-01 ENCOUNTER — Other Ambulatory Visit (HOSPITAL_BASED_OUTPATIENT_CLINIC_OR_DEPARTMENT_OTHER): Payer: Self-pay

## 2021-09-02 ENCOUNTER — Ambulatory Visit: Payer: Medicare Other | Admitting: Cardiology

## 2021-09-28 ENCOUNTER — Other Ambulatory Visit (HOSPITAL_BASED_OUTPATIENT_CLINIC_OR_DEPARTMENT_OTHER): Payer: Self-pay

## 2021-09-28 MED ORDER — OZEMPIC (0.25 OR 0.5 MG/DOSE) 2 MG/3ML ~~LOC~~ SOPN
PEN_INJECTOR | SUBCUTANEOUS | 3 refills | Status: DC
  Filled 2021-09-28: qty 3, 28d supply, fill #0
  Filled 2021-10-30: qty 3, 28d supply, fill #1
  Filled 2021-12-04: qty 3, 28d supply, fill #2

## 2021-10-29 LAB — CUP PACEART REMOTE DEVICE CHECK
Battery Remaining Longevity: 30 mo
Battery Remaining Percentage: 47 %
Brady Statistic RA Percent Paced: 90 %
Brady Statistic RV Percent Paced: 95 %
Date Time Interrogation Session: 20230727114500
HighPow Impedance: 45 Ohm
Implantable Lead Implant Date: 20111201
Implantable Lead Implant Date: 20111201
Implantable Lead Implant Date: 20111201
Implantable Lead Location: 753858
Implantable Lead Location: 753859
Implantable Lead Location: 753860
Implantable Lead Model: 158
Implantable Lead Model: 4396
Implantable Lead Model: 5076
Implantable Lead Serial Number: 302703
Implantable Pulse Generator Implant Date: 20160817
Lead Channel Impedance Value: 583 Ohm
Lead Channel Impedance Value: 652 Ohm
Lead Channel Impedance Value: 929 Ohm
Lead Channel Pacing Threshold Amplitude: 0.7 V
Lead Channel Pacing Threshold Amplitude: 1.1 V
Lead Channel Pacing Threshold Amplitude: 1.9 V
Lead Channel Pacing Threshold Pulse Width: 0.4 ms
Lead Channel Pacing Threshold Pulse Width: 0.4 ms
Lead Channel Pacing Threshold Pulse Width: 1.5 ms
Lead Channel Setting Pacing Amplitude: 2.2 V
Lead Channel Setting Pacing Amplitude: 2.4 V
Lead Channel Setting Pacing Amplitude: 2.4 V
Lead Channel Setting Pacing Pulse Width: 0.4 ms
Lead Channel Setting Pacing Pulse Width: 1.5 ms
Lead Channel Setting Sensing Sensitivity: 0.5 mV
Lead Channel Setting Sensing Sensitivity: 1 mV
Pulse Gen Serial Number: 113381

## 2021-10-30 ENCOUNTER — Other Ambulatory Visit (HOSPITAL_BASED_OUTPATIENT_CLINIC_OR_DEPARTMENT_OTHER): Payer: Self-pay

## 2021-11-09 ENCOUNTER — Other Ambulatory Visit (HOSPITAL_BASED_OUTPATIENT_CLINIC_OR_DEPARTMENT_OTHER): Payer: Self-pay

## 2021-11-14 ENCOUNTER — Other Ambulatory Visit: Payer: Self-pay | Admitting: Internal Medicine

## 2021-11-16 ENCOUNTER — Other Ambulatory Visit (HOSPITAL_BASED_OUTPATIENT_CLINIC_OR_DEPARTMENT_OTHER): Payer: Self-pay

## 2021-11-16 MED ORDER — SPIRONOLACTONE 25 MG PO TABS
ORAL_TABLET | Freq: Every day | ORAL | 2 refills | Status: DC
  Filled 2021-11-16: qty 90, 90d supply, fill #0
  Filled 2022-02-13: qty 90, 90d supply, fill #1
  Filled 2022-05-21 – 2022-05-24 (×2): qty 90, 90d supply, fill #2

## 2021-11-18 ENCOUNTER — Ambulatory Visit: Payer: Medicare Other | Admitting: Cardiology

## 2021-11-21 ENCOUNTER — Other Ambulatory Visit (HOSPITAL_BASED_OUTPATIENT_CLINIC_OR_DEPARTMENT_OTHER): Payer: Self-pay

## 2021-11-23 ENCOUNTER — Other Ambulatory Visit (HOSPITAL_BASED_OUTPATIENT_CLINIC_OR_DEPARTMENT_OTHER): Payer: Self-pay

## 2021-11-23 MED ORDER — LISINOPRIL 40 MG PO TABS
40.0000 mg | ORAL_TABLET | Freq: Every day | ORAL | 3 refills | Status: DC
  Filled 2021-11-23: qty 90, 90d supply, fill #0
  Filled 2022-02-27: qty 90, 90d supply, fill #1
  Filled 2022-05-30: qty 90, 90d supply, fill #2
  Filled 2022-08-26 (×2): qty 90, 90d supply, fill #3

## 2021-11-30 ENCOUNTER — Other Ambulatory Visit (HOSPITAL_BASED_OUTPATIENT_CLINIC_OR_DEPARTMENT_OTHER): Payer: Self-pay

## 2021-12-02 NOTE — Progress Notes (Signed)
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 Note a cardiac catheterization in April of 2009 prior to his procedure showed an ejection fraction of 20%, no coronary disease, dilated aortic root, moderate aortic stenosis and mild mitral regurgitation. The patient subsequently had aortic valve replacement with 25 mm Carpentier-Edwards valve, replacement of ascending aortic aneurysm with 38-mm Hemishield graft, modified Cox maze III. Note he did develop atrial fibrillation 6 months after his procedure. A cardioversion was attempted by his report but he only held sinus for one week. Patient had CRT-D 2011. He was placed on tikosyn for his atrial fibrillation/flutter. His dose was decreased because of increasing QT. Most recent echocardiogram February 2021 showed ejection fraction 45 to 50%, mild mitral regurgitation, prior aortic valve replacement with mean gradient 10 mmHg, moderately dilated ascending aorta at 43 mm.  CTA April 2022 showed 4 cm ascending thoracic aortic aneurysm. Since I last saw him, he denies dyspnea, chest pain, palpitations, syncope or bleeding.  Current Outpatient Medications  Medication Sig Dispense Refill   amoxicillin (AMOXIL) 500 MG capsule TAKE 4 CAPSULES (2,000 MG TOTAL) BY MOUTH ONCE AS NEEDED (FOR DENTAL PROCEDURES). 4 capsule 3   apixaban (ELIQUIS) 5 MG TABS tablet Take 1 tablet (5 mg total) by mouth 2 (two) times daily. 180 tablet 3   Blood Glucose Monitoring Suppl ([place]) w/Device KIT use to test blood sugars     carvedilol (COREG) 12.5 MG tablet Take 1 tablet (12.5 mg total) by mouth 2 (two) times daily with a meal. 180 tablet 3   dapagliflozin propanediol (FARXIGA) 10 MG TABS tablet Take 1 tablet by mouth once daily 90 tablet 2   dofetilide (TIKOSYN) 250 MCG capsule Take 1 capsule (250 mcg total) by mouth 2 (two) times daily. 180  capsule 3   furosemide (LASIX) 20 MG tablet Take 1 tablet by mouth daily.     glucose blood (ONETOUCH VERIO) test strip Use to test blood sugars In Vitro once a day 100 each 5   lisinopril (ZESTRIL) 40 MG tablet TAKE 1 TABLET BY MOUTH ONCE DAILY 90 tablet 3   metFORMIN (GLUCOPHAGE) 1000 MG tablet TAKE 1 TABLET BY MOUTH TWICE DAILY 180 tablet 3   Mupirocin 2 % KIT apply to affected area BID until resolved     Semaglutide,0.25 or 0.5MG/DOS, (OZEMPIC, 0.25 OR 0.5 MG/DOSE,) 2 MG/3ML SOPN Inject 0.10m under the skin once a week 3 mL 3   spironolactone (ALDACTONE) 25 MG tablet TAKE 1 TABLET BY MOUTH ONCE DAILY 90 tablet 2   Zoster Vaccine Adjuvanted ([facility] injection Inject into the muscle. 0.5 mL 0   amoxicillin (AMOXIL) 500 MG capsule 4 tabs (Patient not taking: Reported on 12/16/2021)     dapagliflozin propanediol (FARXIGA) 10 MG TABS tablet 1 tablet (Patient not taking: Reported on 12/16/2021)     furosemide (LASIX) 40 MG tablet Take 1 tablet by mouth once daily. Take an extra tablet as needed for swelling or shortness of breath (Patient not taking: Reported on 12/16/2021) 90 tablet 3   No current facility-administered medications for this visit.     Past Medical History:  Diagnosis Date   Aortic stenosis/ bicuspid valve--s/p AVR    Ascending aortic aneurysm ([facility]    Atrial fibrillation/flutter    Biventricular implantable cardiac defibrillator- BSX    Cholelithiases    Diabetes mellitus without complication ([place]  DJD (degenerative joint disease)    Hypertension    LBBB (left bundle branch block)    Non-ischemic cardiomyopathy (HCC)    OSA (obstructive sleep apnea)     Past Surgical History:  Procedure Laterality Date   AORTIC VALVE REPLACEMENT     APPENDECTOMY     CARDIOVERSION N/A 05/18/2021   Procedure: CARDIOVERSION;  Surgeon: Skeet Latch, MD;  Location: [place];  Service: Cardiovascular;  Laterality: N/A;   CERVICAL DISCECTOMY  5/01   C7   CHOLECYSTECTOMY      EP IMPLANTABLE DEVICE N/A 11/20/2014   Procedure:  ICD Generator Changeout;  Surgeon: [name], MD;  Location: [place] CV LAB;  Service: Cardiovascular;  Laterality: N/A;   MEDIAN STERNOTOMY     TONSILLECTOMY      Social History   Socioeconomic History   Marital status: Married    Spouse name: Not on file   Number of children: 2   Years of education: Not on file   Highest education level: Not on file  Occupational History   Occupation: retired Chief Financial Officer  Tobacco Use   Smoking status: Former   Smokeless tobacco: Never  Scientific laboratory technician Use: Never used  Substance and Sexual Activity   Alcohol use: No   Drug use: No   Sexual activity: Not on file  Other Topics Concern   Not on file  Social History Narrative   Not on file   Social Determinants of Health   Financial Resource Strain: Not on file  Food Insecurity: Not on file  Transportation Needs: Not on file  Physical Activity: Not on file  Stress: Not on file  Social Connections: Not on file  Intimate Partner Violence: Not on file    Family History  Problem Relation Age of Onset   Emphysema Father    Leukemia Mother    Hypertension Maternal Grandmother    Stroke Maternal Grandmother    Congestive Heart Failure Sister    Diabetes Brother    Alzheimer's disease Brother     ROS: no fevers or chills, productive cough, hemoptysis, dysphasia, odynophagia, melena, hematochezia, dysuria, hematuria, rash, seizure activity, orthopnea, PND, pedal edema, claudication. Remaining systems are negative.  Physical Exam: Well-developed well-nourished in no acute distress.  Skin is warm and dry.  HEENT is normal.  Neck is supple.  Chest is clear to auscultation with normal expansion.  Cardiovascular exam is regular rate and rhythm.  2/6 systolic murmur left sternal border.  No diastolic murmur. Abdominal exam nontender or distended. No masses palpated. Extremities show no edema. neuro grossly  intact  ECG-ventricular paced rhythm personally reviewed  A/P  1 paroxysmal atrial fibrillation-plan to continue Tikosyn and beta-blocker.  Continue apixaban.  2 status post aortic valve replacement-continue SBE prophylaxis.  3 nonischemic cardiomyopathy-continue ACE inhibitor and beta-blocker.  LV function mildly reduced on most recent echocardiogram.  We will repeat study.  4 thoracic aortic aneurysm-mildly dilated on most recent CTA.  We will repeat CTA.  5 hypertension-patient's blood pressure is controlled.  Continue present medications.  4 ICD-followed by Dr. Caryl Comes.  Kirk Ruths, MD

## 2021-12-04 ENCOUNTER — Other Ambulatory Visit (HOSPITAL_BASED_OUTPATIENT_CLINIC_OR_DEPARTMENT_OTHER): Payer: Self-pay

## 2021-12-11 ENCOUNTER — Other Ambulatory Visit (HOSPITAL_BASED_OUTPATIENT_CLINIC_OR_DEPARTMENT_OTHER): Payer: Self-pay

## 2021-12-14 ENCOUNTER — Other Ambulatory Visit (HOSPITAL_BASED_OUTPATIENT_CLINIC_OR_DEPARTMENT_OTHER): Payer: Self-pay

## 2021-12-16 ENCOUNTER — Encounter: Payer: Self-pay | Admitting: Cardiology

## 2021-12-16 ENCOUNTER — Ambulatory Visit: Payer: Medicare Other | Attending: Cardiology | Admitting: Cardiology

## 2021-12-16 VITALS — BP 118/70 | HR 75 | Ht 70.0 in | Wt 203.1 lb

## 2021-12-16 DIAGNOSIS — I480 Paroxysmal atrial fibrillation: Secondary | ICD-10-CM | POA: Diagnosis not present

## 2021-12-16 DIAGNOSIS — I428 Other cardiomyopathies: Secondary | ICD-10-CM | POA: Diagnosis not present

## 2021-12-16 DIAGNOSIS — I10 Essential (primary) hypertension: Secondary | ICD-10-CM | POA: Diagnosis not present

## 2021-12-16 DIAGNOSIS — I7121 Aneurysm of the ascending aorta, without rupture: Secondary | ICD-10-CM | POA: Diagnosis not present

## 2021-12-16 DIAGNOSIS — Z952 Presence of prosthetic heart valve: Secondary | ICD-10-CM

## 2021-12-16 NOTE — Patient Instructions (Signed)
  Testing/Procedures:  Your physician has requested that you have an echocardiogram. Echocardiography is a painless test that uses sound waves to create images of your heart. It provides your doctor with information about the size and shape of your heart and how well your heart's chambers and valves are working. This procedure takes approximately one hour. There are no restrictions for this procedure. [facility] [facility] [facility] -1ST FLOOR IMAGING DEPARTMENT   CTA OF THE CHEST IN THE IMAGING DEPARTMENT [facility] [facility] [facility]   Follow-Up: At [facility], you and your health needs are our priority.  As part of our continuing mission to provide you with exceptional heart care, we have created designated Provider Care Teams.  These Care Teams include your primary Cardiologist (physician) and Advanced Practice Providers (APPs -  Physician Assistants and Nurse Practitioners) who all work together to provide you with the care you need, when you need it.  We recommend signing up for the patient portal called "MyChart".  Sign up information is provided on this After Visit Summary.  MyChart is used to connect with patients for Virtual Visits (Telemedicine).  Patients are able to view lab/test results, encounter notes, upcoming appointments, etc.  Non-urgent messages can be sent to your provider as well.   To learn more about what you can do with MyChart, go to [email or web address].    Your next appointment:   12 month(s)  The format for your next appointment:   In Person  Provider:   Olga Millers MD

## 2021-12-21 ENCOUNTER — Other Ambulatory Visit (HOSPITAL_BASED_OUTPATIENT_CLINIC_OR_DEPARTMENT_OTHER): Payer: Self-pay

## 2021-12-21 MED ORDER — OZEMPIC (1 MG/DOSE) 4 MG/3ML ~~LOC~~ SOPN
1.0000 mg | PEN_INJECTOR | SUBCUTANEOUS | 5 refills | Status: DC
Start: 2021-12-21 — End: 2022-06-18
  Filled 2021-12-21: qty 3, 28d supply, fill #0
  Filled 2022-01-23: qty 3, 28d supply, fill #1
  Filled 2022-02-22: qty 3, 28d supply, fill #2
  Filled 2022-03-22: qty 3, 28d supply, fill #3
  Filled 2022-04-16: qty 3, 28d supply, fill #4
  Filled 2022-05-21 – 2022-05-24 (×2): qty 3, 28d supply, fill #5

## 2021-12-25 ENCOUNTER — Ambulatory Visit (HOSPITAL_BASED_OUTPATIENT_CLINIC_OR_DEPARTMENT_OTHER)
Admission: RE | Admit: 2021-12-25 | Discharge: 2021-12-25 | Disposition: A | Discharge status: Home / Self Care | Payer: Medicare Other | Source: Ambulatory Visit | Attending: Cardiology | Admitting: Cardiology

## 2021-12-25 DIAGNOSIS — I7121 Aneurysm of the ascending aorta, without rupture: Secondary | ICD-10-CM | POA: Insufficient documentation

## 2021-12-25 DIAGNOSIS — Z952 Presence of prosthetic heart valve: Secondary | ICD-10-CM | POA: Diagnosis not present

## 2021-12-25 LAB — POCT I-STAT CREATININE: Creatinine, Ser: 1.3 mg/dL — ABNORMAL HIGH (ref 0.61–1.24)

## 2021-12-25 MED ORDER — IOHEXOL 350 MG/ML SOLN
100.0000 mL | Freq: Once | INTRAVENOUS | Status: AC | PRN
  Administered 2021-12-25: 100 mL via INTRAVENOUS

## 2021-12-25 MED ORDER — PERFLUTREN LIPID MICROSPHERE
1.0000 mL | INTRAVENOUS | Status: DC | PRN
  Administered 2021-12-25: 2 mL via INTRAVENOUS

## 2021-12-25 NOTE — Progress Notes (Signed)
  Echocardiogram 2D Echocardiogram with contrast has been performed.  Merrie Roof F 12/25/2021, 10:02 AM

## 2021-12-26 LAB — ECHOCARDIOGRAM COMPLETE
Calc EF: 53.7 %
S' Lateral: 3.7 cm
Single Plane A2C EF: 48.4 %
Single Plane A4C EF: 54.4 %

## 2022-01-04 ENCOUNTER — Other Ambulatory Visit (HOSPITAL_BASED_OUTPATIENT_CLINIC_OR_DEPARTMENT_OTHER): Payer: Self-pay

## 2022-01-25 ENCOUNTER — Other Ambulatory Visit (HOSPITAL_BASED_OUTPATIENT_CLINIC_OR_DEPARTMENT_OTHER): Payer: Self-pay

## 2022-01-27 ENCOUNTER — Other Ambulatory Visit (HOSPITAL_BASED_OUTPATIENT_CLINIC_OR_DEPARTMENT_OTHER): Payer: Self-pay

## 2022-01-27 MED ORDER — FLUAD QUADRIVALENT 0.5 ML IM PRSY
PREFILLED_SYRINGE | INTRAMUSCULAR | 0 refills | Status: DC
  Filled 2022-01-27: qty 0.5, 1d supply, fill #0

## 2022-01-28 LAB — CUP PACEART REMOTE DEVICE CHECK
Battery Remaining Longevity: 30 mo
Battery Remaining Percentage: 42 %
Brady Statistic RA Percent Paced: 91 %
Brady Statistic RV Percent Paced: 95 %
Date Time Interrogation Session: 20231026041100
HighPow Impedance: 48 Ohm
Implantable Lead Connection Status: 753985
Implantable Lead Connection Status: 753985
Implantable Lead Connection Status: 753985
Implantable Lead Implant Date: 20111201
Implantable Lead Implant Date: 20111201
Implantable Lead Implant Date: 20111201
Implantable Lead Location: 753858
Implantable Lead Location: 753859
Implantable Lead Location: 753860
Implantable Lead Model: 158
Implantable Lead Model: 4396
Implantable Lead Model: 5076
Implantable Lead Serial Number: 302703
Implantable Pulse Generator Implant Date: 20160817
Lead Channel Impedance Value: 592 Ohm
Lead Channel Impedance Value: 686 Ohm
Lead Channel Impedance Value: 971 Ohm
Lead Channel Pacing Threshold Amplitude: 0.7 V
Lead Channel Pacing Threshold Amplitude: 1.1 V
Lead Channel Pacing Threshold Amplitude: 1.9 V
Lead Channel Pacing Threshold Pulse Width: 0.4 ms
Lead Channel Pacing Threshold Pulse Width: 0.4 ms
Lead Channel Pacing Threshold Pulse Width: 1.5 ms
Lead Channel Setting Pacing Amplitude: 2.2 V
Lead Channel Setting Pacing Amplitude: 2.4 V
Lead Channel Setting Pacing Amplitude: 2.4 V
Lead Channel Setting Pacing Pulse Width: 0.4 ms
Lead Channel Setting Pacing Pulse Width: 1.5 ms
Lead Channel Setting Sensing Sensitivity: 0.5 mV
Lead Channel Setting Sensing Sensitivity: 1 mV
Pulse Gen Serial Number: 113381

## 2022-02-15 ENCOUNTER — Other Ambulatory Visit (HOSPITAL_BASED_OUTPATIENT_CLINIC_OR_DEPARTMENT_OTHER): Payer: Self-pay

## 2022-02-17 ENCOUNTER — Other Ambulatory Visit (HOSPITAL_BASED_OUTPATIENT_CLINIC_OR_DEPARTMENT_OTHER): Payer: Self-pay

## 2022-02-22 ENCOUNTER — Other Ambulatory Visit (HOSPITAL_BASED_OUTPATIENT_CLINIC_OR_DEPARTMENT_OTHER): Payer: Self-pay

## 2022-03-01 ENCOUNTER — Other Ambulatory Visit (HOSPITAL_BASED_OUTPATIENT_CLINIC_OR_DEPARTMENT_OTHER): Payer: Self-pay

## 2022-03-08 ENCOUNTER — Other Ambulatory Visit (HOSPITAL_BASED_OUTPATIENT_CLINIC_OR_DEPARTMENT_OTHER): Payer: Self-pay

## 2022-03-13 ENCOUNTER — Other Ambulatory Visit (HOSPITAL_BASED_OUTPATIENT_CLINIC_OR_DEPARTMENT_OTHER): Payer: Self-pay

## 2022-03-15 ENCOUNTER — Other Ambulatory Visit (HOSPITAL_BASED_OUTPATIENT_CLINIC_OR_DEPARTMENT_OTHER): Payer: Self-pay

## 2022-03-15 MED ORDER — FARXIGA 10 MG PO TABS
10.0000 mg | ORAL_TABLET | Freq: Every day | ORAL | 2 refills | Status: DC
  Filled 2022-03-15: qty 90, 90d supply, fill #0
  Filled 2022-06-12 – 2022-06-14 (×2): qty 90, 90d supply, fill #1
  Filled 2022-09-10: qty 90, 90d supply, fill #2

## 2022-03-22 ENCOUNTER — Other Ambulatory Visit (HOSPITAL_BASED_OUTPATIENT_CLINIC_OR_DEPARTMENT_OTHER): Payer: Self-pay

## 2022-04-12 ENCOUNTER — Other Ambulatory Visit (HOSPITAL_BASED_OUTPATIENT_CLINIC_OR_DEPARTMENT_OTHER): Payer: Self-pay

## 2022-04-12 ENCOUNTER — Other Ambulatory Visit: Payer: Self-pay

## 2022-04-16 ENCOUNTER — Other Ambulatory Visit (HOSPITAL_BASED_OUTPATIENT_CLINIC_OR_DEPARTMENT_OTHER): Payer: Self-pay

## 2022-04-29 LAB — CUP PACEART REMOTE DEVICE CHECK
Battery Remaining Longevity: 24 mo
Battery Remaining Percentage: 37 %
Brady Statistic RA Percent Paced: 93 %
Brady Statistic RV Percent Paced: 96 %
Date Time Interrogation Session: 20240123130200
HighPow Impedance: 50 Ohm
Implantable Lead Connection Status: 753985
Implantable Lead Connection Status: 753985
Implantable Lead Connection Status: 753985
Implantable Lead Implant Date: 20111201
Implantable Lead Implant Date: 20111201
Implantable Lead Implant Date: 20111201
Implantable Lead Location: 753858
Implantable Lead Location: 753859
Implantable Lead Location: 753860
Implantable Lead Model: 158
Implantable Lead Model: 4396
Implantable Lead Model: 5076
Implantable Lead Serial Number: 302703
Implantable Pulse Generator Implant Date: 20160817
Lead Channel Impedance Value: 1053 Ohm
Lead Channel Impedance Value: 610 Ohm
Lead Channel Impedance Value: 728 Ohm
Lead Channel Pacing Threshold Amplitude: 0.7 V
Lead Channel Pacing Threshold Amplitude: 1.1 V
Lead Channel Pacing Threshold Amplitude: 1.9 V
Lead Channel Pacing Threshold Pulse Width: 0.4 ms
Lead Channel Pacing Threshold Pulse Width: 0.4 ms
Lead Channel Pacing Threshold Pulse Width: 1.5 ms
Lead Channel Setting Pacing Amplitude: 2.2 V
Lead Channel Setting Pacing Amplitude: 2.4 V
Lead Channel Setting Pacing Amplitude: 2.4 V
Lead Channel Setting Pacing Pulse Width: 0.4 ms
Lead Channel Setting Pacing Pulse Width: 1.5 ms
Lead Channel Setting Sensing Sensitivity: 0.5 mV
Lead Channel Setting Sensing Sensitivity: 1 mV
Pulse Gen Serial Number: 113381

## 2022-05-10 ENCOUNTER — Other Ambulatory Visit (HOSPITAL_BASED_OUTPATIENT_CLINIC_OR_DEPARTMENT_OTHER): Payer: Self-pay

## 2022-05-10 ENCOUNTER — Ambulatory Visit
Admission: EM | Admit: 2022-05-10 | Discharge: 2022-05-10 | Disposition: A | Discharge status: Home / Self Care | Payer: Medicare Other

## 2022-05-10 DIAGNOSIS — S80822A Blister (nonthermal), left lower leg, initial encounter: Secondary | ICD-10-CM | POA: Diagnosis not present

## 2022-05-10 MED ORDER — DOXYCYCLINE HYCLATE 100 MG PO CAPS
100.0000 mg | ORAL_CAPSULE | Freq: Two times a day (BID) | ORAL | 0 refills | Status: DC
  Filled 2022-05-10: qty 20, 10d supply, fill #0

## 2022-05-10 MED ORDER — MUPIROCIN CALCIUM 2 % EX CREA
1.0000 | TOPICAL_CREAM | Freq: Two times a day (BID) | CUTANEOUS | 0 refills | Status: AC
  Filled 2022-05-10: qty 15, 8d supply, fill #0

## 2022-05-10 NOTE — ED Provider Notes (Signed)
[facility]    CSN: [id number] Arrival date & time: 05/10/22  0809      History   Chief Complaint Chief Complaint  Patient presents with   Wound Check    HPI [name] is a 82 y.o. male presents for evaluation of a blister to his left lower leg.  Patient reports he sleeps in a recliner often crossing his legs and he feels like that may have caused a blister to his left lower leg.  It has been there for over a week and a few days ago began draining a yellow liquid.  It is continue to do so since that time.  He has noticed some pain to the area and swelling and redness.  Denies any fevers or chills.  He has a history of cellulitis but denies history of MRSA.  He is a type II diabetic.  He has been using Neosporin OTC for symptoms.  No other concerns at this time.   Wound Check    Past Medical History:  Diagnosis Date   Aortic stenosis/ bicuspid valve--s/p AVR    Ascending aortic aneurysm (HCC)    Atrial fibrillation/flutter    Biventricular implantable cardiac defibrillator- BSX    Cholelithiases    Diabetes mellitus without complication (HCC)    DJD (degenerative joint disease)    Hypertension    LBBB (left bundle branch block)    Non-ischemic cardiomyopathy (HCC)    OSA (obstructive sleep apnea)     Patient Active Problem List   Diagnosis Date Noted   LBBB (left bundle branch block) 06/11/2021   Typical atrial flutter ([place])    Abdominal aortic aneurysm ([place]) 02/20/2014   Encounter for therapeutic drug monitoring 08/03/2013   Bruit 02/14/2013   Hypertension    Nonischemic cardiomyopathy ([place]) 02/03/2011   Biventricular implantable cardioverter-defibrillator in situ 08/04/2010   Atrial fibrillation ([place]) 44/04/270   SYSTOLIC HEART FAILURE, CHRONIC 01/26/2010   ESSENTIAL HYPERTENSION, BENIGN 05/07/2009   AORTIC VALVE REPLACEMENT, HX OF 05/07/2009   SLEEP APNEA, OBSTRUCTIVE 05/06/2009   Aortic valve disorder 05/06/2009   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/2009   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   CHOLELITHIASIS 05/06/2009   DEGENERATIVE JOINT DISEASE 05/06/2009   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   SHORTNESS OF BREATH 05/06/2009    Past Surgical History:  Procedure Laterality Date   AORTIC VALVE REPLACEMENT     APPENDECTOMY     CARDIOVERSION N/A 05/18/2021   Procedure: CARDIOVERSION;  Surgeon: Skeet Latch, MD;  Location: [place];  Service: Cardiovascular;  Laterality: N/A;   CERVICAL DISCECTOMY  5/01   C7   CHOLECYSTECTOMY     EP IMPLANTABLE DEVICE N/A 11/20/2014   Procedure:  ICD Generator Changeout;  Surgeon: [name], MD;  Location: [place] CV LAB;  Service: Cardiovascular;  Laterality: N/A;   MEDIAN STERNOTOMY     TONSILLECTOMY         Home Medications    Prior to Admission medications   Medication Sig Start Date End Date Taking? Authorizing Provider  aspirin 81 MG chewable tablet Chew by mouth daily.   Yes [provider]  doxycycline (VIBRAMYCIN) 100 MG capsule Take 1 capsule (100 mg total) by mouth 2 (two) times daily. 05/10/22  Yes Melynda Ripple, NP  mupirocin cream (BACTROBAN) 2 % Apply 1 Application topically 2 (two) times daily for 7 days. 05/10/22 05/17/22 Yes Melynda Ripple, NP  Blood Glucose Monitoring Suppl (ONETOUCH VERIO FLEX  SYSTEM) w/Device KIT use to test blood sugars 01/24/17   [provider]  carvedilol (COREG) 12.5 MG tablet Take 1 tablet (12.5 mg total) by mouth 2 (two) times daily with a meal. 06/16/21   Crenshaw, Denice Bors, MD  dapagliflozin propanediol (FARXIGA) 10 MG TABS tablet 1 tablet Patient not taking: Reported on 12/16/2021    [provider]  dapagliflozin propanediol (FARXIGA) 10 MG TABS tablet Take 1 tablet (10 mg total) by mouth daily. 03/14/22     dofetilide (TIKOSYN) 250 MCG capsule Take 1 capsule (250 mcg total) by mouth 2 (two) times daily. 06/22/21   [name], MD  furosemide (LASIX) 20 MG tablet  Take 1 tablet by mouth daily. 03/13/12   [provider]  furosemide (LASIX) 40 MG tablet Take 1 tablet by mouth once daily. Take an extra tablet as needed for swelling or shortness of breath Patient not taking: Reported on 12/16/2021 07/21/21   Lelon Perla, MD  glucose blood (ONETOUCH VERIO) test strip Use to test blood sugars In Vitro once a day 02/25/21     influenza vaccine adjuvanted (FLUAD QUADRIVALENT) 0.5 ML injection Inject into the muscle. 01/26/22   Carlyle Basques, MD  lisinopril (ZESTRIL) 40 MG tablet TAKE 1 TABLET BY MOUTH ONCE DAILY 11/22/21     metFORMIN (GLUCOPHAGE) 1000 MG tablet TAKE 1 TABLET BY MOUTH TWICE DAILY 02/09/21     Semaglutide, 1 MG/DOSE, (OZEMPIC, 1 MG/DOSE,) 4 MG/3ML SOPN Inject 1 mg into the skin once a week. 12/21/21     Semaglutide,0.25 or 0.5MG /DOS, (OZEMPIC, 0.25 OR 0.5 MG/DOSE,) 2 MG/3ML SOPN Inject 0.5mg  under the skin once a week 09/28/21     spironolactone (ALDACTONE) 25 MG tablet TAKE 1 TABLET BY MOUTH ONCE DAILY 11/16/21 11/16/22  [name], MD  Zoster Vaccine Adjuvanted [facility]) injection Inject into the muscle. 01/15/21   Carlyle Basques, MD    Family History Family History  Problem Relation Age of Onset   Emphysema Father    Leukemia Mother    Hypertension Maternal Grandmother    Stroke Maternal Grandmother    Congestive Heart Failure Sister    Diabetes Brother    Alzheimer's disease Brother     Social History Social History   Tobacco Use   Smoking status: Former   Smokeless tobacco: Never  Scientific laboratory technician Use: Never used  Substance Use Topics   Alcohol use: No   Drug use: No     Allergies   Patient has no known allergies.   Review of Systems Review of Systems  Skin:  Positive for wound.     Physical Exam Triage Vital Signs ED Triage Vitals  Enc Vitals Group     BP 05/10/22 0849 121/67     Pulse Rate 05/10/22 0849 75     Resp 05/10/22 0849 16     Temp 05/10/22 0849 98.1 F (36.7 C)     Temp  Source 05/10/22 0849 Oral     SpO2 05/10/22 0849 94 %     Weight --      Height --      Head Circumference --      Peak Flow --      Pain Score 05/10/22 0847 5     Pain Loc --      Pain Edu? --      Excl. in [place]? --    No data found.  Updated Vital Signs BP 121/67 (BP Location: Right Arm)   Pulse  75   Temp 98.1 F (36.7 C) (Oral)   Resp 16   SpO2 94%   Visual Acuity Right Eye Distance:   Left Eye Distance:   Bilateral Distance:    Right Eye Near:   Left Eye Near:    Bilateral Near:     Physical Exam Vitals and nursing note reviewed.  Constitutional:      Appearance: Normal appearance.  HENT:     Head: Normocephalic and atraumatic.  Eyes:     Pupils: Pupils are equal, round, and reactive to light.  Cardiovascular:     Rate and Rhythm: Normal rate.  Pulmonary:     Effort: Pulmonary effort is normal.  Skin:    General: Skin is warm and dry.       Neurological:     General: No focal deficit present.     Mental Status: He is alert and oriented to person, place, and time.  Psychiatric:        Mood and Affect: Mood normal.        Behavior: Behavior normal.      UC Treatments / Results  Labs (all labs ordered are listed, but only abnormal results are displayed) Labs Reviewed - No data to display  EKG   Radiology No results found.  Procedures Procedures (including critical care time)  Medications Ordered in UC Medications - No data to display  Initial Impression / Assessment and Plan / UC Course  I have reviewed the triage vital signs and the nursing notes.  Pertinent labs & imaging results that were available during my care of the patient were reviewed by me and considered in my medical decision making (see chart for details).     Reviewed exam and symptoms with patient.  No red flags on exam. Wound cleansed and dressed by nursing staff Start mupirocin topical as well as doxycycline.  Side effect profile reviewed.   Wound car reviewed with  patient Signs and symptoms of worsening infection reviewed Follow-up with PCP in 2 days for recheck ER precautions reviewed and patient verbalized understanding  Final diagnoses:  Blister of left lower leg, initial encounter     Discharge Instructions      Keep wound clean and dry Apply topical antibiotic mupirocin twice daily for 7 days Doxycycline twice daily for 10 days.  This medication can make you more sensitive to sunlight.  Use extra caution when outside Please follow-up with your PCP in 2 days for recheck Please go to the emergency room if you have any worsening symptoms   ED Prescriptions     Medication Sig Dispense Auth. Provider   mupirocin cream (BACTROBAN) 2 % Apply 1 Application topically 2 (two) times daily for 7 days. 15 g Melynda Ripple, NP   doxycycline (VIBRAMYCIN) 100 MG capsule Take 1 capsule (100 mg total) by mouth 2 (two) times daily. 20 capsule Melynda Ripple, NP      PDMP not reviewed this encounter.   Melynda Ripple, NP 05/10/22 [phone]

## 2022-05-10 NOTE — Discharge Instructions (Signed)
Keep wound clean and dry Apply topical antibiotic mupirocin twice daily for 7 days Doxycycline twice daily for 10 days.  This medication can make you more sensitive to sunlight.  Use extra caution when outside Please follow-up with your PCP in 2 days for recheck Please go to the emergency room if you have any worsening symptoms

## 2022-05-10 NOTE — ED Triage Notes (Signed)
Pt c/o ruptured blister to LLE.   There is a partial ruptured blister to the LLE (with purulent drainage- scant).   Started: a few days ago   Home interventions: neosporin

## 2022-05-24 ENCOUNTER — Other Ambulatory Visit (HOSPITAL_BASED_OUTPATIENT_CLINIC_OR_DEPARTMENT_OTHER): Payer: Self-pay

## 2022-05-24 ENCOUNTER — Other Ambulatory Visit: Payer: Self-pay

## 2022-05-30 ENCOUNTER — Other Ambulatory Visit (HOSPITAL_BASED_OUTPATIENT_CLINIC_OR_DEPARTMENT_OTHER): Payer: Self-pay

## 2022-05-31 ENCOUNTER — Other Ambulatory Visit: Payer: Self-pay

## 2022-05-31 ENCOUNTER — Other Ambulatory Visit (HOSPITAL_BASED_OUTPATIENT_CLINIC_OR_DEPARTMENT_OTHER): Payer: Self-pay

## 2022-05-31 MED ORDER — METFORMIN HCL 1000 MG PO TABS
1000.0000 mg | ORAL_TABLET | Freq: Two times a day (BID) | ORAL | 3 refills | Status: DC
  Filled 2022-05-31: qty 180, 90d supply, fill #0

## 2022-06-14 ENCOUNTER — Other Ambulatory Visit (HOSPITAL_BASED_OUTPATIENT_CLINIC_OR_DEPARTMENT_OTHER): Payer: Self-pay

## 2022-06-17 ENCOUNTER — Other Ambulatory Visit (HOSPITAL_BASED_OUTPATIENT_CLINIC_OR_DEPARTMENT_OTHER): Payer: Self-pay

## 2022-06-18 ENCOUNTER — Other Ambulatory Visit (HOSPITAL_BASED_OUTPATIENT_CLINIC_OR_DEPARTMENT_OTHER): Payer: Self-pay

## 2022-06-18 MED ORDER — OZEMPIC (1 MG/DOSE) 4 MG/3ML ~~LOC~~ SOPN
1.0000 mg | PEN_INJECTOR | SUBCUTANEOUS | 5 refills | Status: DC
  Filled 2022-06-18: qty 3, 28d supply, fill #0
  Filled 2022-07-14: qty 3, 28d supply, fill #1
  Filled 2022-08-11: qty 3, 28d supply, fill #2
  Filled 2022-09-06: qty 3, 28d supply, fill #3
  Filled 2022-10-06: qty 3, 28d supply, fill #4
  Filled 2022-11-01: qty 3, 28d supply, fill #5

## 2022-07-14 ENCOUNTER — Other Ambulatory Visit (HOSPITAL_BASED_OUTPATIENT_CLINIC_OR_DEPARTMENT_OTHER): Payer: Self-pay

## 2022-07-16 ENCOUNTER — Other Ambulatory Visit: Payer: Self-pay | Admitting: Cardiology

## 2022-07-16 ENCOUNTER — Other Ambulatory Visit (HOSPITAL_BASED_OUTPATIENT_CLINIC_OR_DEPARTMENT_OTHER): Payer: Self-pay

## 2022-07-16 MED ORDER — CARVEDILOL 12.5 MG PO TABS
12.5000 mg | ORAL_TABLET | Freq: Two times a day (BID) | ORAL | 1 refills | Status: DC
  Filled 2022-07-16: qty 180, 90d supply, fill #0
  Filled 2022-11-01: qty 180, 90d supply, fill #1

## 2022-07-19 ENCOUNTER — Other Ambulatory Visit (HOSPITAL_BASED_OUTPATIENT_CLINIC_OR_DEPARTMENT_OTHER): Payer: Self-pay

## 2022-07-23 ENCOUNTER — Other Ambulatory Visit (HOSPITAL_BASED_OUTPATIENT_CLINIC_OR_DEPARTMENT_OTHER): Payer: Self-pay

## 2022-07-23 ENCOUNTER — Other Ambulatory Visit: Payer: Self-pay | Admitting: Internal Medicine

## 2022-07-23 MED ORDER — DOFETILIDE 250 MCG PO CAPS
250.0000 ug | ORAL_CAPSULE | Freq: Two times a day (BID) | ORAL | 3 refills | Status: DC
  Filled 2022-07-23: qty 180, 90d supply, fill #0

## 2022-07-29 ENCOUNTER — Telehealth: Payer: Self-pay

## 2022-07-29 LAB — CUP PACEART REMOTE DEVICE CHECK
Battery Remaining Longevity: 24 mo
Battery Remaining Percentage: 34 %
Brady Statistic RA Percent Paced: 89 %
Brady Statistic RV Percent Paced: 96 %
Date Time Interrogation Session: 20240425041200
HighPow Impedance: 47 Ohm
Implantable Lead Connection Status: 753985
Implantable Lead Connection Status: 753985
Implantable Lead Connection Status: 753985
Implantable Lead Implant Date: 20111201
Implantable Lead Implant Date: 20111201
Implantable Lead Implant Date: 20111201
Implantable Lead Location: 753858
Implantable Lead Location: 753859
Implantable Lead Location: 753860
Implantable Lead Model: 158
Implantable Lead Model: 4396
Implantable Lead Model: 5076
Implantable Lead Serial Number: 302703
Implantable Pulse Generator Implant Date: 20160817
Lead Channel Impedance Value: 598 Ohm
Lead Channel Impedance Value: 744 Ohm
Lead Channel Impedance Value: 999 Ohm
Lead Channel Pacing Threshold Amplitude: 0.7 V
Lead Channel Pacing Threshold Amplitude: 1.1 V
Lead Channel Pacing Threshold Amplitude: 1.9 V
Lead Channel Pacing Threshold Pulse Width: 0.4 ms
Lead Channel Pacing Threshold Pulse Width: 0.4 ms
Lead Channel Pacing Threshold Pulse Width: 1.5 ms
Lead Channel Setting Pacing Amplitude: 2.2 V
Lead Channel Setting Pacing Amplitude: 2.4 V
Lead Channel Setting Pacing Amplitude: 2.4 V
Lead Channel Setting Pacing Pulse Width: 0.4 ms
Lead Channel Setting Pacing Pulse Width: 1.5 ms
Lead Channel Setting Sensing Sensitivity: 0.5 mV
Lead Channel Setting Sensing Sensitivity: 1 mV
Pulse Gen Serial Number: 113381

## 2022-07-29 NOTE — Telephone Encounter (Signed)
Alert received from [facility]:  Scheduled remote reviewed. Normal device function.   Ongoing AF since 07/21/2022, appears patient is on ASA only according to previous report. AF burden is 100% of the time since last evaluation on 07/23/2022.  There is good ventricular rate control.   Sent to triage.  Per review of Pt chart, the last time Pt saw Dr. Jens Som he was on Eliquis.  Pt went to the ER on 05/10/22 and requested at that time that Eliquis be removed from his med list.  Outreach made to Pt.  He states he is not taking Eliquis, and he is not going to restart it.  He states whenever he works in the yard and gets a scratch he bleeds.  Advised of risk of not taking Eliquis.  He states he has lived a good life and understands the risks and is "ready to go".  Advised would forward to Dr. Graciela Husbands and Dr. Jens Som.  He states they can call him but he is not going to resume Eliquis.

## 2022-08-11 ENCOUNTER — Other Ambulatory Visit (HOSPITAL_BASED_OUTPATIENT_CLINIC_OR_DEPARTMENT_OTHER): Payer: Self-pay

## 2022-08-20 ENCOUNTER — Other Ambulatory Visit: Payer: Self-pay | Admitting: Cardiology

## 2022-08-20 ENCOUNTER — Other Ambulatory Visit: Payer: Self-pay | Admitting: Internal Medicine

## 2022-08-20 ENCOUNTER — Other Ambulatory Visit (HOSPITAL_BASED_OUTPATIENT_CLINIC_OR_DEPARTMENT_OTHER): Payer: Self-pay

## 2022-08-20 MED ORDER — FUROSEMIDE 40 MG PO TABS
ORAL_TABLET | ORAL | 1 refills | Status: DC
  Filled 2022-08-20: qty 90, 90d supply, fill #0
  Filled 2022-11-25: qty 90, 90d supply, fill #1

## 2022-08-20 MED ORDER — SPIRONOLACTONE 25 MG PO TABS
ORAL_TABLET | Freq: Every day | ORAL | 2 refills | Status: DC
  Filled 2022-08-20: qty 90, 90d supply, fill #0
  Filled 2022-11-12: qty 90, 90d supply, fill #1
  Filled 2023-02-22: qty 90, 90d supply, fill #2

## 2022-08-24 ENCOUNTER — Other Ambulatory Visit (HOSPITAL_BASED_OUTPATIENT_CLINIC_OR_DEPARTMENT_OTHER): Payer: Self-pay

## 2022-08-24 MED ORDER — CLINDAMYCIN HCL 300 MG PO CAPS
ORAL_CAPSULE | ORAL | 0 refills | Status: AC
  Filled 2022-08-24: qty 17, 6d supply, fill #0

## 2022-08-26 ENCOUNTER — Other Ambulatory Visit (HOSPITAL_BASED_OUTPATIENT_CLINIC_OR_DEPARTMENT_OTHER): Payer: Self-pay

## 2022-09-06 ENCOUNTER — Other Ambulatory Visit (HOSPITAL_BASED_OUTPATIENT_CLINIC_OR_DEPARTMENT_OTHER): Payer: Self-pay

## 2022-09-10 ENCOUNTER — Other Ambulatory Visit (HOSPITAL_BASED_OUTPATIENT_CLINIC_OR_DEPARTMENT_OTHER): Payer: Self-pay

## 2022-09-15 ENCOUNTER — Other Ambulatory Visit (HOSPITAL_BASED_OUTPATIENT_CLINIC_OR_DEPARTMENT_OTHER): Payer: Self-pay

## 2022-09-15 MED ORDER — DOXYCYCLINE HYCLATE 100 MG PO CAPS
100.0000 mg | ORAL_CAPSULE | Freq: Two times a day (BID) | ORAL | 0 refills | Status: DC
  Filled 2022-09-15: qty 10, 5d supply, fill #0

## 2022-09-29 ENCOUNTER — Encounter: Payer: Self-pay | Admitting: Internal Medicine

## 2022-09-29 ENCOUNTER — Ambulatory Visit: Payer: Medicare Other | Attending: Internal Medicine | Admitting: Internal Medicine

## 2022-09-29 VITALS — BP 110/76 | HR 70 | Ht 70.0 in | Wt 199.8 lb

## 2022-09-29 DIAGNOSIS — I4819 Other persistent atrial fibrillation: Secondary | ICD-10-CM

## 2022-09-29 DIAGNOSIS — I5042 Chronic combined systolic (congestive) and diastolic (congestive) heart failure: Secondary | ICD-10-CM | POA: Diagnosis not present

## 2022-09-29 DIAGNOSIS — I428 Other cardiomyopathies: Secondary | ICD-10-CM | POA: Diagnosis not present

## 2022-09-29 DIAGNOSIS — Z95810 Presence of automatic (implantable) cardiac defibrillator: Secondary | ICD-10-CM | POA: Diagnosis not present

## 2022-09-29 NOTE — Progress Notes (Signed)
Patient Care Team: [name], Adelfa Koh, MD as PCP - General Duke Salvia, MD as PCP - Electrophysiology (Cardiology)   HPI  JUVENCIO VERDI is a 82 y.o. male Seen in followup for CRT-D implantation in the fall 2011 for nonischemic cardiomyopathy. He underwent device replacement 8/16 history of atrial flutter; 1/23 found to be in persistent since 6/22 atrial fibrillation managed with dofetilide, having stopped his anticoagulation and then this was resumed    2009 porcine aortic valve replacement with 25 mm Carpentier-Edwards valve, replacement of ascending aortic aneurysm with 38-mm Hemishield graft, as well as Cox maze procedure for atrial fibrillation.    The patient denies chest pain, shortness of breath, nocturnal dyspnea, orthopnea or peripheral edema.  There have been no palpitations, lightheadedness or syncope.    Atrial arrhythmias--previously on apixaban no longer.  DATE TEST EF   4/09 LHC  20 % No obstructive CAD  8//14 Echo   50 %   12/16 Echo  60-65% Mean A Grad 12  2/21 Echo 45-50 %   4/22 CTA  Ao 40  mm  9/23 Echo  50-55%     Today Aortic arch was reviewed by CT scanning 2/19 4.1 cm  Date Cr K Mg Hgb  2/19 1.14 5.0>>5.1 2.8 14.9 (4/19)  2/20 1.08 4.5 2.2 15.6  3/22 1.21 5.1 2.4   2/23 1.20 4.6 2.5 13.9  9/23 1.3               Thromboembolic risk factors ( age  -2, HTN-1, DM-1, CHF-1 ) for a CHADSVASc Score of >=5      Past Medical History:  Diagnosis Date   Aortic stenosis/ bicuspid valve--s/p AVR    Ascending aortic aneurysm (HCC)    Atrial fibrillation/flutter    Biventricular implantable cardiac defibrillator- BSX    Cholelithiases    Diabetes mellitus without complication (HCC)    DJD (degenerative joint disease)    Hypertension    LBBB (left bundle branch block)    Non-ischemic cardiomyopathy (HCC)    OSA (obstructive sleep apnea)     Past Surgical History:  Procedure Laterality Date   AORTIC VALVE REPLACEMENT     APPENDECTOMY      CARDIOVERSION N/A 05/18/2021   Procedure: CARDIOVERSION;  Surgeon: Chilton Si, MD;  Location: [facility] ENDOSCOPY;  Service: Cardiovascular;  Laterality: N/A;   CERVICAL DISCECTOMY  5/01   C7   CHOLECYSTECTOMY     EP IMPLANTABLE DEVICE N/A 11/20/2014   Procedure:  ICD Generator Changeout;  Surgeon: Duke Salvia, MD;  Location: [facility] INVASIVE CV LAB;  Service: Cardiovascular;  Laterality: N/A;   MEDIAN STERNOTOMY     TONSILLECTOMY      Current Outpatient Medications  Medication Sig Dispense Refill   Blood Glucose Monitoring Suppl (ONETOUCH VERIO FLEX SYSTEM) w/Device KIT use to test blood sugars     carvedilol (COREG) 12.5 MG tablet Take 1 tablet (12.5 mg total) by mouth 2 (two) times daily with a meal. 180 tablet 1   dapagliflozin propanediol (FARXIGA) 10 MG TABS tablet Take 1 tablet (10 mg total) by mouth daily. 90 tablet 2   dofetilide (TIKOSYN) 250 MCG capsule Take 1 capsule (250 mcg total) by mouth 2 (two) times daily. 180 capsule 3   furosemide (LASIX) 40 MG tablet Take 1 tablet by mouth once daily. Take an extra tablet as needed for swelling or shortness of breath 90 tablet 1   glucose blood (ONETOUCH VERIO) test strip Use to  test blood sugars In Vitro once a day 100 each 5   lisinopril (ZESTRIL) 40 MG tablet TAKE 1 TABLET BY MOUTH ONCE DAILY 90 tablet 3   metFORMIN (GLUCOPHAGE) 1000 MG tablet Take 1 tablet (1,000 mg total) by mouth 2 (two) times daily. 180 tablet 3   Semaglutide, 1 MG/DOSE, (OZEMPIC, 1 MG/DOSE,) 4 MG/3ML SOPN Inject 1 mg into the skin once a week. 3 mL 5   spironolactone (ALDACTONE) 25 MG tablet TAKE 1 TABLET BY MOUTH ONCE DAILY 90 tablet 2   aspirin 81 MG chewable tablet Chew by mouth daily.     No current facility-administered medications for this visit.    No Known Allergies  Review of Systems negative except from HPI and PMH  Physical Exam BP 110/76   Pulse 70   Ht 5\' 10"  (1.778 m)   Wt 199 lb 12.8 oz (90.6 kg)   SpO2 95%   BMI 28.67 kg/m  Well  developed and well nourished in no acute distress HENT normal Neck supple with JVP-flat Clear Device pocket well healed; without hematoma or erythema.  There is no tethering  Regular rate and rhythm, no  murmur Abd-soft with active BS No Clubbing cyanosis  edema Skin-warm and dry A & Oriented  Grossly normal sensory and motor function  ECG atrial fluuter with biventricular pacing with an upright QRS lead V1 and negative QRS lead I  Device function is normal. Programming changes none  See Paceart for details      Assessment and  Plan  Nonischemic cardiomyopathy- resolved/improved    Aortic stenosis S./P. bioprosthetic replacement  Implantable defibrillator-CRT-[employer]    Hypertension  Congestive heart.-Chronic mixed   PVCs    Atrial fibrillation-persistent   High Risk Medication Surveillance dofetilide    Recovered left ventricular dysfunction.  Blood pressure well-controlled and continue GDMT with lisinopril and carvedilol.  Also on Aldactone.  Is declined ongoing anticoagulation,   hence we will discontinue dofetilide as he is in atrial flutter.  Blood pressure is well-controlled on the aforementioned medications.  Euvolemic.  Continue the Aldactone and the Lasix

## 2022-09-29 NOTE — Patient Instructions (Addendum)
Medication Instructions:  Your physician has recommended you make the following change in your medication:   ** Stop Tikosyn  *If you need a refill on your cardiac medications before your next appointment, please call your pharmacy*   Lab Work: None ordered.  If you have labs (blood work) drawn today and your tests are completely normal, you will receive your results only by: MyChart Message (if you have MyChart) OR A paper copy in the mail If you have any lab test that is abnormal or we need to change your treatment, we will call you to review the results.   Testing/Procedures: None ordered.    Follow-Up: At [facility], you and your health needs are our priority.  As part of our continuing mission to provide you with exceptional heart care, we have created designated Provider Care Teams.  These Care Teams include your primary Cardiologist (physician) and Advanced Practice Providers (APPs -  Physician Assistants and Nurse Practitioners) who all work together to provide you with the care you need, when you need it.  We recommend signing up for the patient portal called "MyChart".  Sign up information is provided on this After Visit Summary.  MyChart is used to connect with patients for Virtual Visits (Telemedicine).  Patients are able to view lab/test results, encounter notes, upcoming appointments, etc.  Non-urgent messages can be sent to your provider as well.   To learn more about what you can do with MyChart, go to [email or web address].    Your next appointment:   12 months with Dr Graciela Husbands

## 2022-10-06 ENCOUNTER — Other Ambulatory Visit (HOSPITAL_BASED_OUTPATIENT_CLINIC_OR_DEPARTMENT_OTHER): Payer: Self-pay

## 2022-10-21 ENCOUNTER — Other Ambulatory Visit (HOSPITAL_BASED_OUTPATIENT_CLINIC_OR_DEPARTMENT_OTHER): Payer: Self-pay

## 2022-10-21 MED ORDER — ONETOUCH VERIO VI STRP
ORAL_STRIP | 5 refills | Status: AC
  Filled 2022-10-21: qty 100, 90d supply, fill #0
  Filled 2022-12-30: qty 100, 90d supply, fill #1
  Filled 2023-04-29 – 2023-05-02 (×2): qty 100, 90d supply, fill #2

## 2022-10-22 ENCOUNTER — Other Ambulatory Visit (HOSPITAL_BASED_OUTPATIENT_CLINIC_OR_DEPARTMENT_OTHER): Payer: Self-pay

## 2022-10-28 LAB — CUP PACEART REMOTE DEVICE CHECK
Battery Remaining Longevity: 18 mo
Battery Remaining Percentage: 28 %
Brady Statistic RA Percent Paced: 0 %
Brady Statistic RV Percent Paced: 61 %
Date Time Interrogation Session: 20240725041100
HighPow Impedance: 50 Ohm
Implantable Lead Connection Status: 753985
Implantable Lead Connection Status: 753985
Implantable Lead Connection Status: 753985
Implantable Lead Implant Date: 20111201
Implantable Lead Implant Date: 20111201
Implantable Lead Implant Date: 20111201
Implantable Lead Location: 753858
Implantable Lead Location: 753859
Implantable Lead Location: 753860
Implantable Lead Model: 158
Implantable Lead Model: 4396
Implantable Lead Model: 5076
Implantable Lead Serial Number: 302703
Implantable Pulse Generator Implant Date: 20160817
Lead Channel Impedance Value: 1016 Ohm
Lead Channel Impedance Value: 606 Ohm
Lead Channel Impedance Value: 739 Ohm
Lead Channel Pacing Threshold Amplitude: 0.9 V
Lead Channel Pacing Threshold Amplitude: 1.1 V
Lead Channel Pacing Threshold Amplitude: 1.8 V
Lead Channel Pacing Threshold Pulse Width: 0.4 ms
Lead Channel Pacing Threshold Pulse Width: 0.4 ms
Lead Channel Pacing Threshold Pulse Width: 1.5 ms
Lead Channel Setting Pacing Amplitude: 2.2 V
Lead Channel Setting Pacing Amplitude: 2.4 V
Lead Channel Setting Pacing Amplitude: 2.4 V
Lead Channel Setting Pacing Pulse Width: 0.4 ms
Lead Channel Setting Pacing Pulse Width: 1.5 ms
Lead Channel Setting Sensing Sensitivity: 0.5 mV
Lead Channel Setting Sensing Sensitivity: 1 mV
Pulse Gen Serial Number: 113381

## 2022-11-01 ENCOUNTER — Other Ambulatory Visit (HOSPITAL_BASED_OUTPATIENT_CLINIC_OR_DEPARTMENT_OTHER): Payer: Self-pay

## 2022-11-25 ENCOUNTER — Other Ambulatory Visit (HOSPITAL_BASED_OUTPATIENT_CLINIC_OR_DEPARTMENT_OTHER): Payer: Self-pay

## 2022-11-26 ENCOUNTER — Other Ambulatory Visit (HOSPITAL_BASED_OUTPATIENT_CLINIC_OR_DEPARTMENT_OTHER): Payer: Self-pay

## 2022-11-26 ENCOUNTER — Other Ambulatory Visit: Payer: Self-pay

## 2022-11-26 MED ORDER — DAPAGLIFLOZIN PROPANEDIOL 10 MG PO TABS
10.0000 mg | ORAL_TABLET | Freq: Every day | ORAL | 2 refills | Status: DC
  Filled 2022-11-26: qty 90, 90d supply, fill #0
  Filled 2023-03-16: qty 90, 90d supply, fill #1
  Filled 2023-06-09: qty 90, 90d supply, fill #2

## 2022-11-26 MED ORDER — LISINOPRIL 40 MG PO TABS
40.0000 mg | ORAL_TABLET | Freq: Every day | ORAL | 3 refills | Status: DC
  Filled 2022-11-26: qty 90, 90d supply, fill #0
  Filled 2023-02-24: qty 90, 90d supply, fill #1
  Filled 2023-05-26: qty 90, 90d supply, fill #2
  Filled 2023-08-25: qty 90, 90d supply, fill #3

## 2022-12-03 ENCOUNTER — Other Ambulatory Visit (HOSPITAL_BASED_OUTPATIENT_CLINIC_OR_DEPARTMENT_OTHER): Payer: Self-pay

## 2022-12-03 MED ORDER — OZEMPIC (1 MG/DOSE) 4 MG/3ML ~~LOC~~ SOPN
1.0000 mg | PEN_INJECTOR | SUBCUTANEOUS | 5 refills | Status: DC
  Filled 2022-12-03: qty 3, 28d supply, fill #0
  Filled 2023-01-03: qty 3, 28d supply, fill #1
  Filled 2023-01-29: qty 3, 28d supply, fill #2
  Filled 2023-02-22: qty 3, 28d supply, fill #3
  Filled 2023-03-25: qty 3, 28d supply, fill #4
  Filled 2023-04-27: qty 3, 28d supply, fill #5

## 2022-12-13 ENCOUNTER — Other Ambulatory Visit: Payer: Self-pay

## 2022-12-13 ENCOUNTER — Encounter (HOSPITAL_COMMUNITY): Payer: Self-pay

## 2022-12-13 ENCOUNTER — Inpatient Hospital Stay (HOSPITAL_COMMUNITY)
Admission: EM | Admit: 2022-12-13 | Discharge: 2022-12-16 | DRG: 872 | Disposition: A | Discharge status: Home / Self Care | Payer: Medicare Other | Attending: Internal Medicine | Admitting: Internal Medicine

## 2022-12-13 ENCOUNTER — Observation Stay (HOSPITAL_COMMUNITY): Payer: Medicare Other

## 2022-12-13 ENCOUNTER — Emergency Department (HOSPITAL_COMMUNITY): Payer: Medicare Other

## 2022-12-13 DIAGNOSIS — Z95810 Presence of automatic (implantable) cardiac defibrillator: Secondary | ICD-10-CM

## 2022-12-13 DIAGNOSIS — A419 Sepsis, unspecified organism: Secondary | ICD-10-CM | POA: Diagnosis not present

## 2022-12-13 DIAGNOSIS — J189 Pneumonia, unspecified organism: Secondary | ICD-10-CM | POA: Insufficient documentation

## 2022-12-13 DIAGNOSIS — Z7984 Long term (current) use of oral hypoglycemic drugs: Secondary | ICD-10-CM

## 2022-12-13 DIAGNOSIS — Z833 Family history of diabetes mellitus: Secondary | ICD-10-CM

## 2022-12-13 DIAGNOSIS — Z79899 Other long term (current) drug therapy: Secondary | ICD-10-CM

## 2022-12-13 DIAGNOSIS — Z8249 Family history of ischemic heart disease and other diseases of the circulatory system: Secondary | ICD-10-CM

## 2022-12-13 DIAGNOSIS — Z825 Family history of asthma and other chronic lower respiratory diseases: Secondary | ICD-10-CM

## 2022-12-13 DIAGNOSIS — Z806 Family history of leukemia: Secondary | ICD-10-CM

## 2022-12-13 DIAGNOSIS — Z9049 Acquired absence of other specified parts of digestive tract: Secondary | ICD-10-CM

## 2022-12-13 DIAGNOSIS — G4733 Obstructive sleep apnea (adult) (pediatric): Secondary | ICD-10-CM | POA: Diagnosis present

## 2022-12-13 DIAGNOSIS — E872 Acidosis, unspecified: Secondary | ICD-10-CM | POA: Diagnosis present

## 2022-12-13 DIAGNOSIS — R161 Splenomegaly, not elsewhere classified: Secondary | ICD-10-CM | POA: Diagnosis present

## 2022-12-13 DIAGNOSIS — I959 Hypotension, unspecified: Secondary | ICD-10-CM | POA: Diagnosis not present

## 2022-12-13 DIAGNOSIS — I4891 Unspecified atrial fibrillation: Secondary | ICD-10-CM | POA: Diagnosis present

## 2022-12-13 DIAGNOSIS — E8770 Fluid overload, unspecified: Secondary | ICD-10-CM | POA: Diagnosis present

## 2022-12-13 DIAGNOSIS — R6520 Severe sepsis without septic shock: Secondary | ICD-10-CM | POA: Diagnosis present

## 2022-12-13 DIAGNOSIS — I10 Essential (primary) hypertension: Secondary | ICD-10-CM | POA: Diagnosis present

## 2022-12-13 DIAGNOSIS — R0982 Postnasal drip: Secondary | ICD-10-CM | POA: Diagnosis present

## 2022-12-13 DIAGNOSIS — R7989 Other specified abnormal findings of blood chemistry: Secondary | ICD-10-CM | POA: Diagnosis present

## 2022-12-13 DIAGNOSIS — I428 Other cardiomyopathies: Secondary | ICD-10-CM | POA: Diagnosis present

## 2022-12-13 DIAGNOSIS — E119 Type 2 diabetes mellitus without complications: Secondary | ICD-10-CM | POA: Diagnosis present

## 2022-12-13 DIAGNOSIS — L89322 Pressure ulcer of left buttock, stage 2: Secondary | ICD-10-CM | POA: Diagnosis present

## 2022-12-13 DIAGNOSIS — N390 Urinary tract infection, site not specified: Secondary | ICD-10-CM | POA: Insufficient documentation

## 2022-12-13 DIAGNOSIS — Z7901 Long term (current) use of anticoagulants: Secondary | ICD-10-CM

## 2022-12-13 DIAGNOSIS — Z823 Family history of stroke: Secondary | ICD-10-CM

## 2022-12-13 DIAGNOSIS — A4151 Sepsis due to Escherichia coli [E. coli]: Principal | ICD-10-CM | POA: Diagnosis present

## 2022-12-13 DIAGNOSIS — Z7985 Long-term (current) use of injectable non-insulin antidiabetic drugs: Secondary | ICD-10-CM

## 2022-12-13 DIAGNOSIS — Z953 Presence of xenogenic heart valve: Secondary | ICD-10-CM

## 2022-12-13 DIAGNOSIS — Z820 Family history of epilepsy and other diseases of the nervous system: Secondary | ICD-10-CM

## 2022-12-13 DIAGNOSIS — Z7982 Long term (current) use of aspirin: Secondary | ICD-10-CM

## 2022-12-13 DIAGNOSIS — Z87891 Personal history of nicotine dependence: Secondary | ICD-10-CM

## 2022-12-13 LAB — CBC WITH DIFFERENTIAL/PLATELET
Abs Immature Granulocytes: 0.1 10*3/uL — ABNORMAL HIGH (ref 0.00–0.07)
Basophils Absolute: 0 10*3/uL (ref 0.0–0.1)
Basophils Relative: 0 %
Eosinophils Absolute: 0 10*3/uL (ref 0.0–0.5)
Eosinophils Relative: 0 %
HCT: 41.2 % (ref 39.0–52.0)
Hemoglobin: 13.6 g/dL (ref 13.0–17.0)
Immature Granulocytes: 1 %
Lymphocytes Relative: 1 %
Lymphs Abs: 0.2 10*3/uL — ABNORMAL LOW (ref 0.7–4.0)
MCH: 28.9 pg (ref 26.0–34.0)
MCHC: 33 g/dL (ref 30.0–36.0)
MCV: 87.7 fL (ref 80.0–100.0)
Monocytes Absolute: 1.2 10*3/uL — ABNORMAL HIGH (ref 0.1–1.0)
Monocytes Relative: 8 %
Neutro Abs: 13.6 10*3/uL — ABNORMAL HIGH (ref 1.7–7.7)
Neutrophils Relative %: 90 %
Platelets: 176 10*3/uL (ref 150–400)
RBC: 4.7 MIL/uL (ref 4.22–5.81)
RDW: 13.6 % (ref 11.5–15.5)
WBC: 15.2 10*3/uL — ABNORMAL HIGH (ref 4.0–10.5)
nRBC: 0 % (ref 0.0–0.2)

## 2022-12-13 LAB — CBC
HCT: 37.3 % — ABNORMAL LOW (ref 39.0–52.0)
Hemoglobin: 12.3 g/dL — ABNORMAL LOW (ref 13.0–17.0)
MCH: 29.1 pg (ref 26.0–34.0)
MCHC: 33 g/dL (ref 30.0–36.0)
MCV: 88.4 fL (ref 80.0–100.0)
Platelets: 153 10*3/uL (ref 150–400)
RBC: 4.22 MIL/uL (ref 4.22–5.81)
RDW: 14 % (ref 11.5–15.5)
WBC: 12.9 10*3/uL — ABNORMAL HIGH (ref 4.0–10.5)
nRBC: 0 % (ref 0.0–0.2)

## 2022-12-13 LAB — COMPREHENSIVE METABOLIC PANEL
ALT: 43 U/L (ref 0–44)
AST: 32 U/L (ref 15–41)
Albumin: 3.5 g/dL (ref 3.5–5.0)
Alkaline Phosphatase: 77 U/L (ref 38–126)
Anion gap: 10 (ref 5–15)
BUN: 40 mg/dL — ABNORMAL HIGH (ref 8–23)
CO2: 23 mmol/L (ref 22–32)
Calcium: 8.7 mg/dL — ABNORMAL LOW (ref 8.9–10.3)
Chloride: 105 mmol/L (ref 98–111)
Creatinine, Ser: 1.38 mg/dL — ABNORMAL HIGH (ref 0.61–1.24)
GFR, Estimated: 51 mL/min — ABNORMAL LOW (ref >60)
Glucose, Bld: 239 mg/dL — ABNORMAL HIGH (ref 70–99)
Potassium: 4 mmol/L (ref 3.5–5.1)
Sodium: 138 mmol/L (ref 135–145)
Total Bilirubin: 2.2 mg/dL — ABNORMAL HIGH (ref 0.3–1.2)
Total Protein: 6 g/dL — ABNORMAL LOW (ref 6.5–8.1)

## 2022-12-13 LAB — PROTIME-INR
INR: 1.1 (ref 0.8–1.2)
Prothrombin Time: 14.3 s (ref 11.4–15.2)

## 2022-12-13 LAB — I-STAT CHEM 8, ED
BUN: 39 mg/dL — ABNORMAL HIGH (ref 8–23)
Calcium, Ion: 1.16 mmol/L (ref 1.15–1.40)
Chloride: 106 mmol/L (ref 98–111)
Creatinine, Ser: 1.3 mg/dL — ABNORMAL HIGH (ref 0.61–1.24)
Glucose, Bld: 250 mg/dL — ABNORMAL HIGH (ref 70–99)
HCT: 39 % (ref 39.0–52.0)
Hemoglobin: 13.3 g/dL (ref 13.0–17.0)
Potassium: 4 mmol/L (ref 3.5–5.1)
Sodium: 141 mmol/L (ref 135–145)
TCO2: 22 mmol/L (ref 22–32)

## 2022-12-13 LAB — URINALYSIS, W/ REFLEX TO CULTURE (INFECTION SUSPECTED)
Bilirubin Urine: NEGATIVE
Glucose, UA: >=500 mg/dL — AB
Ketones, ur: NEGATIVE mg/dL
Leukocytes,Ua: NEGATIVE
Nitrite: NEGATIVE
Protein, ur: NEGATIVE mg/dL
RBC / HPF: >50 RBC/hpf (ref 0–5)
Specific Gravity, Urine: 1.036 — ABNORMAL HIGH (ref 1.005–1.030)
pH: 5 (ref 5.0–8.0)

## 2022-12-13 LAB — GLUCOSE, CAPILLARY
Glucose-Capillary: 222 mg/dL — ABNORMAL HIGH (ref 70–99)
Glucose-Capillary: 191 mg/dL — ABNORMAL HIGH (ref 70–99)

## 2022-12-13 LAB — APTT: aPTT: 27 s (ref 24–36)

## 2022-12-13 LAB — CREATININE, SERUM
Creatinine, Ser: 1.38 mg/dL — ABNORMAL HIGH (ref 0.61–1.24)
GFR, Estimated: 51 mL/min — ABNORMAL LOW (ref >60)

## 2022-12-13 LAB — HEMOGLOBIN A1C
Hgb A1c MFr Bld: 7.4 % — ABNORMAL HIGH (ref 4.8–5.6)
Mean Plasma Glucose: 165.68 mg/dL

## 2022-12-13 LAB — I-STAT CG4 LACTIC ACID, ED
Lactic Acid, Venous: 2.8 mmol/L (ref 0.5–1.9)
Lactic Acid, Venous: 2.3 mmol/L (ref 0.5–1.9)

## 2022-12-13 LAB — TROPONIN I (HIGH SENSITIVITY)
Troponin I (High Sensitivity): 20 ng/L — ABNORMAL HIGH (ref <18)
Troponin I (High Sensitivity): 37 ng/L — ABNORMAL HIGH (ref <18)

## 2022-12-13 MED ORDER — IOHEXOL 350 MG/ML SOLN
75.0000 mL | Freq: Once | INTRAVENOUS | Status: AC | PRN
  Administered 2022-12-13: 75 mL via INTRAVENOUS

## 2022-12-13 MED ORDER — SODIUM CHLORIDE 0.9 % IV SOLN
2.0000 g | Freq: Two times a day (BID) | INTRAVENOUS | Status: DC
  Administered 2022-12-13: 2 g via INTRAVENOUS
  Filled 2022-12-13: qty 20

## 2022-12-13 MED ORDER — HYDROCODONE-ACETAMINOPHEN 5-325 MG PO TABS: 1.0000 | ORAL_TABLET | ORAL | Status: DC | PRN

## 2022-12-13 MED ORDER — POLYETHYLENE GLYCOL 3350 17 G PO PACK: 17.0000 g | PACK | Freq: Every day | ORAL | Status: DC | PRN

## 2022-12-13 MED ORDER — ONDANSETRON HCL 4 MG/2ML IJ SOLN: 4.0000 mg | Freq: Three times a day (TID) | INTRAMUSCULAR | Status: DC | PRN

## 2022-12-13 MED ORDER — VANCOMYCIN HCL 1500 MG/300ML IV SOLN
1500.0000 mg | Freq: Once | INTRAVENOUS | Status: AC
  Administered 2022-12-13: 1500 mg via INTRAVENOUS
  Filled 2022-12-13 (×2): qty 300

## 2022-12-13 MED ORDER — ACETAMINOPHEN 325 MG PO TABS
650.0000 mg | ORAL_TABLET | Freq: Four times a day (QID) | ORAL | Status: DC | PRN
  Administered 2022-12-15: 650 mg via ORAL
  Filled 2022-12-13: qty 2

## 2022-12-13 MED ORDER — IBUPROFEN 200 MG PO TABS
400.0000 mg | ORAL_TABLET | Freq: Once | ORAL | Status: AC
  Administered 2022-12-13: 400 mg via ORAL
  Filled 2022-12-13: qty 2

## 2022-12-13 MED ORDER — ALBUTEROL SULFATE (2.5 MG/3ML) 0.083% IN NEBU
2.5000 mg | INHALATION_SOLUTION | RESPIRATORY_TRACT | Status: DC | PRN
  Administered 2022-12-15 – 2022-12-16 (×2): 2.5 mg via RESPIRATORY_TRACT
  Filled 2022-12-13 (×2): qty 3

## 2022-12-13 MED ORDER — ACETAMINOPHEN 650 MG RE SUPP: 650.0000 mg | Freq: Four times a day (QID) | RECTAL | Status: DC | PRN

## 2022-12-13 MED ORDER — LACTATED RINGERS IV SOLN: INTRAVENOUS | Status: DC

## 2022-12-13 MED ORDER — ENSURE ENLIVE PO LIQD
237.0000 mL | Freq: Two times a day (BID) | ORAL | Status: DC
  Administered 2022-12-14 – 2022-12-16 (×5): 237 mL via ORAL

## 2022-12-13 MED ORDER — INSULIN ASPART 100 UNIT/ML IJ SOLN
0.0000 [IU] | Freq: Three times a day (TID) | INTRAMUSCULAR | Status: DC
  Administered 2022-12-14: 1 [IU] via SUBCUTANEOUS
  Administered 2022-12-14 – 2022-12-15 (×3): 2 [IU] via SUBCUTANEOUS
  Administered 2022-12-15: 5 [IU] via SUBCUTANEOUS
  Administered 2022-12-16: 2 [IU] via SUBCUTANEOUS
  Administered 2022-12-16: 3 [IU] via SUBCUTANEOUS

## 2022-12-13 MED ORDER — ENOXAPARIN SODIUM 30 MG/0.3ML IJ SOSY
30.0000 mg | PREFILLED_SYRINGE | INTRAMUSCULAR | Status: DC
  Administered 2022-12-13: 30 mg via SUBCUTANEOUS
  Filled 2022-12-13: qty 0.3

## 2022-12-13 MED ORDER — RINGERS IV SOLN: INTRAVENOUS | Status: DC

## 2022-12-13 MED ORDER — IOHEXOL 350 MG/ML SOLN
60.0000 mL | Freq: Once | INTRAVENOUS | Status: AC | PRN
  Administered 2022-12-13: 60 mL via INTRAVENOUS

## 2022-12-13 MED ORDER — ONDANSETRON HCL 4 MG/2ML IJ SOLN: 4.0000 mg | Freq: Four times a day (QID) | INTRAMUSCULAR | Status: DC | PRN

## 2022-12-13 MED ORDER — LACTATED RINGERS IV BOLUS
1000.0000 mL | Freq: Once | INTRAVENOUS | Status: AC
  Administered 2022-12-13: 1000 mL via INTRAVENOUS

## 2022-12-13 MED ORDER — ONDANSETRON HCL 4 MG PO TABS: 4.0000 mg | ORAL_TABLET | Freq: Four times a day (QID) | ORAL | Status: DC | PRN

## 2022-12-13 MED ORDER — SODIUM CHLORIDE 0.9 % IV SOLN
500.0000 mg | INTRAVENOUS | Status: DC
  Administered 2022-12-13: 500 mg via INTRAVENOUS
  Filled 2022-12-13: qty 5

## 2022-12-13 MED ORDER — SODIUM CHLORIDE 0.9 % IV SOLN
2.0000 g | Freq: Once | INTRAVENOUS | Status: AC
  Administered 2022-12-13: 2 g via INTRAVENOUS
  Filled 2022-12-13: qty 12.5

## 2022-12-13 NOTE — ED Triage Notes (Signed)
Patient reports difficulty urinating  Febrile hypotensive  paced rhythm  Patient did report vomtiing x 1

## 2022-12-13 NOTE — Progress Notes (Signed)
ED Pharmacy Antibiotic Sign Off An antibiotic consult was received from an ED provider for vancomycin and cefepime per pharmacy dosing for sepsis. A chart review was completed to assess appropriateness.   The following one time order(s) were placed:  Vancomycin 1500mg  IV x1  Cefepime 2G iv x1  Further antibiotic and/or antibiotic pharmacy consults should be ordered by the admitting provider if indicated.   Thank you for allowing pharmacy to be a part of this patient's care.   Smitty Cords, [facility]  Clinical Pharmacist 12/13/22 3:20 PM

## 2022-12-13 NOTE — ED Notes (Signed)
Called CT to check patient status, they advised he was next in line.

## 2022-12-13 NOTE — ED Provider Notes (Addendum)
[place] EMERGENCY DEPARTMENT AT [facility] Provider Note   CSN: [id number] Arrival date & time: 12/13/22  1201     History  Chief Complaint  Patient presents with   Code Sepsis    ATHONY [name] is a 82 y.o. male.  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  Patient denies any chest pain or shortness of breath.  He denies any palpitations or lightheadedness.  He denies any syncope.  He states that he down to the ground at home and had difficulty getting up which is why his wife called EMS. I was unable to reach his wife at the number listed in the EMR.  States that for the past day he has had difficulty urinating.  He states that his last bowel movement was yesterday and was a small amount but he is not currently passing gas.  He endorses generalized abdominal discomfort that has been present since yesterday afternoon.  He denies any nausea or vomiting.  States that this morning he felt subjectively warm, did not take his temperature at home.   Home Medications Prior to Admission medications   Medication Sig Start Date End Date Taking? Authorizing Provider  aspirin 81 MG chewable tablet Chew by mouth daily.    [provider]  Blood Glucose Monitoring Suppl (ONETOUCH VERIO FLEX SYSTEM) w/Device KIT use to test blood sugars 01/24/17   [provider]  carvedilol (COREG) 12.5 MG tablet Take 1 tablet (12.5 mg total) by mouth 2 (two) times daily with a meal. 07/16/22   Crenshaw, Madolyn Frieze, MD  dapagliflozin propanediol (FARXIGA) 10 MG TABS tablet Take 1 tablet (10 mg total) by mouth daily. 11/26/22     furosemide (LASIX) 40 MG tablet Take 1  tablet by mouth once daily. Take an extra tablet as needed for swelling or shortness of breath 08/20/22   Lewayne Bunting, MD  glucose blood (ONETOUCH VERIO) test strip Use to test blood sugar once a day. 10/21/22     lisinopril (ZESTRIL) 40 MG tablet Take 1 tablet (40 mg total) by mouth daily. 11/26/22     metFORMIN (GLUCOPHAGE) 1000 MG tablet Take 1 tablet (1,000 mg total) by mouth 2 (two) times daily. 05/31/22     Semaglutide, 1 MG/DOSE, (OZEMPIC, 1 MG/DOSE,) 4 MG/3ML SOPN Inject 1 mg into the skin once a week. 12/03/22     spironolactone (ALDACTONE) 25 MG tablet TAKE 1 TABLET BY MOUTH ONCE DAILY 08/20/22 08/20/23  Duke Salvia, MD      Allergies    Patient has no known allergies.    Review of Systems   Review of Systems  All other systems reviewed and are negative.   Physical Exam Updated Vital Signs BP (!) 96/58   Pulse 82   Temp 98.1 F (36.7 C) (Oral)   Resp (!) 21   Ht 5\' 10"  (1.778 m)   Wt 90.3 kg   SpO2 100%   BMI 28.55 kg/m  Physical Exam Vitals and nursing note reviewed.  Constitutional:      General: He is not in acute distress.    Appearance: He is well-developed.  HENT:     Head: Normocephalic and atraumatic.  Eyes:     Conjunctiva/sclera: Conjunctivae normal.  Cardiovascular:  Rate and Rhythm: Normal rate and regular rhythm.  Pulmonary:     Effort: Pulmonary effort is normal. No respiratory distress.     Breath sounds: Normal breath sounds.  Abdominal:     Palpations: Abdomen is soft.     Tenderness: There is abdominal tenderness. There is no guarding or rebound.  Musculoskeletal:        General: No swelling.     Cervical back: Neck supple.     Right lower leg: No edema.     Left lower leg: No edema.  Skin:    General: Skin is warm and dry.     Capillary Refill: Capillary refill takes less than 2 seconds.  Neurological:     General: No focal deficit present.     Mental Status: He is alert and oriented to person, place, and time.     Cranial  Nerves: No cranial nerve deficit.     Sensory: No sensory deficit.     Motor: No weakness.  Psychiatric:        Mood and Affect: Mood normal.     ED Results / Procedures / Treatments   Labs (all labs ordered are listed, but only abnormal results are displayed) Labs Reviewed  COMPREHENSIVE METABOLIC PANEL - Abnormal; Notable for the following components:      Result Value   Glucose, Bld 239 (*)    BUN 40 (*)    Creatinine, Ser 1.38 (*)    Calcium 8.7 (*)    Total Protein 6.0 (*)    Total Bilirubin 2.2 (*)    GFR, Estimated 51 (*)    All other components within normal limits  CBC WITH DIFFERENTIAL/PLATELET - Abnormal; Notable for the following components:   WBC 15.2 (*)    Neutro Abs 13.6 (*)    Lymphs Abs 0.2 (*)    Monocytes Absolute 1.2 (*)    Abs Immature Granulocytes 0.10 (*)    All other components within normal limits  URINALYSIS, W/ REFLEX TO CULTURE (INFECTION SUSPECTED) - Abnormal; Notable for the following components:   Color, Urine AMBER (*)    Specific Gravity, Urine 1.036 (*)    Glucose, UA >=500 (*)    Hgb urine dipstick LARGE (*)    Bacteria, UA RARE (*)    All other components within normal limits  I-STAT CG4 LACTIC ACID, ED - Abnormal; Notable for the following components:   Lactic Acid, Venous 2.8 (*)    All other components within normal limits  I-STAT CHEM 8, ED - Abnormal; Notable for the following components:   BUN 39 (*)    Creatinine, Ser 1.30 (*)    Glucose, Bld 250 (*)    All other components within normal limits  I-STAT CG4 LACTIC ACID, ED - Abnormal; Notable for the following components:   Lactic Acid, Venous 2.3 (*)    All other components within normal limits  TROPONIN I (HIGH SENSITIVITY) - Abnormal; Notable for the following components:   Troponin I (High Sensitivity) 20 (*)    All other components within normal limits  TROPONIN I (HIGH SENSITIVITY) - Abnormal; Notable for the following components:   Troponin I (High Sensitivity) 37  (*)    All other components within normal limits  CULTURE, BLOOD (ROUTINE X 2)  CULTURE, BLOOD (ROUTINE X 2)  URINE CULTURE  PROTIME-INR  APTT    EKG None  Radiology CT ABDOMEN PELVIS W CONTRAST  Result Date: 12/13/2022 CLINICAL DATA:  Bowel obstruction.  Pain EXAM: CT ABDOMEN  AND PELVIS WITH CONTRAST TECHNIQUE: Multidetector CT imaging of the abdomen and pelvis was performed using the standard protocol following bolus administration of intravenous contrast. RADIATION DOSE REDUCTION: This exam was performed according to the departmental dose-optimization program which includes automated exposure control, adjustment of the mA and/or kV according to patient size and/or use of iterative reconstruction technique. CONTRAST:  75mL OMNIPAQUE IOHEXOL 350 MG/ML SOLN COMPARISON:  None Available. FINDINGS: Lower chest: Heart is mildly enlarged. Defibrillator leads along the heart. There is some linear opacity at the bases likely scar or atelectasis. Hepatobiliary: Diffuse fatty liver infiltration. Previous cholecystectomy. Patent portal vein. Air in the biliary tree. There is some low density seen in the area of the gallbladder fossa but this could be focal fat deposition. Pancreas: Unremarkable. No pancreatic ductal dilatation or surrounding inflammatory changes. Spleen: Splenic granulomas are identified. Spleen is enlarged at 16.0 cm. Adrenals/Urinary Tract: Slight thickening of the left adrenal gland. Right adrenal gland is preserved. Moderate perinephric stranding. No enhancing mass or collecting system dilatation. However there is delayed excretion of contrast on the delayed dataset. Please correlate with the patient's renal function. The ureters have normal course and caliber extending down to the bladder. Preserved contours of the urinary bladder. Bladder is underdistended. Stomach/Bowel: On this non oral contrast exam, large bowel has a normal course and caliber with scattered stool. Small bowel is  nondilated. Stomach is underdistended. Second portion duodenal diverticulum. Vascular/Lymphatic: Scattered vascular calcifications are seen. Normal caliber aorta and IVC. No discrete abnormal lymph node enlargement identified in the abdomen and pelvis. Reproductive: Prominent prostate with mass effect along the base of the bladder. Other: No free air or free fluid. Small bilateral fat containing inguinal hernias. Musculoskeletal: Osteopenia. Scattered degenerative changes of the spine and pelvis. Trace retrolisthesis of L3 on L4. IMPRESSION: Scattered colonic stool.  No bowel obstruction.  No free air. Previous cholecystectomy. There is air in the biliary tree. Please correlate for any previous instrumentation. This was seen previously. Poor excretion from the kidneys with perinephric stranding. Please correlate with the patient's renal function. Splenic enlargement. Electronically Signed   By: Karen Kays M.D.   On: 12/13/2022 16:05   DG Chest Port 1 View  Result Date: 12/13/2022 CLINICAL DATA:  Questionable sepsis. Difficulty urinating, fever, hypotension, vomiting, and shortness of breath. EXAM: PORTABLE CHEST 1 VIEW COMPARISON:  Chest radiographs 11/25/2017 and CT 12/25/2021 FINDINGS: A 3 lead pacemaker/ICD remains in place. The cardiac silhouette is borderline enlarged. Predominantly interstitial type opacities are present in the left greater than right lungs. No sizable pleural effusion or pneumothorax is identified. Prior cervical spine fusion is noted. IMPRESSION: Left greater than right lung opacities which could reflect pneumonia or edema. Electronically Signed   By: Sebastian Ache M.D.   On: 12/13/2022 13:40    Procedures .Critical Care  Performed by: Ernie Avena, MD Authorized by: Ernie Avena, MD   Critical care provider statement:    Critical care time (minutes):  30   Critical care was necessary to treat or prevent imminent or life-threatening deterioration of the following conditions:   Shock   Critical care was time spent personally by me on the following activities:  Development of treatment plan with patient or surrogate, discussions with consultants, evaluation of patient's response to treatment, examination of patient, ordering and review of laboratory studies, ordering and review of radiographic studies, ordering and performing treatments and interventions, pulse oximetry, re-evaluation of patient's condition and review of old charts     Medications Ordered in  ED Medications  ondansetron (ZOFRAN) injection 4 mg (has no administration in time range)  vancomycin (VANCOREADY) IVPB 1500 mg/300 mL (1,500 mg Intravenous New Bag/Given 12/13/22 1613)  ringers solution (has no administration in time range)  cefTRIAXone (ROCEPHIN) 2 g in sodium chloride 0.9 % 100 mL IVPB (has no administration in time range)  lactated ringers bolus 1,000 mL (0 mLs Intravenous Stopped 12/13/22 1349)  iohexol (OMNIPAQUE) 350 MG/ML injection 75 mL (75 mLs Intravenous Contrast Given 12/13/22 1416)  lactated ringers bolus 1,000 mL (0 mLs Intravenous Stopped 12/13/22 1614)  ceFEPIme (MAXIPIME) 2 g in sodium chloride 0.9 % 100 mL IVPB (0 g Intravenous Stopped 12/13/22 1629)    ED Course/ Medical Decision Making/ A&P                                 Medical Decision Making Amount and/or Complexity of Data Reviewed Labs: ordered. Radiology: ordered. ECG/medicine tests: ordered.  Risk Prescription drug management. Decision regarding hospitalization.    82 year old male with medical history significant for atrial fibrillation on anticoagulation, nonischemic cardiomyopathy status post improvement in heart function, porcine aortic valve replacement, replacement of ascending aortic aneurysm, Maze procedure for atrial fibrillation ICD in place [employer]) presents to the emergency department with hypotension.  Patient denies any chest pain or shortness of breath.  He denies any palpitations or  lightheadedness.  He denies any syncope.  He states that he down to the ground at home and had difficulty getting up which is why his wife called EMS. I was unable to reach his wife at the number listed in the EMR.  States that for the past day he has had difficulty urinating.  He states that his last bowel movement was yesterday and was a small amount but he is not currently passing gas.  He endorses generalized abdominal discomfort that has been present since yesterday afternoon.  He denies any nausea or vomiting.  States that this morning he felt subjectively warm, did not take his temperature at home.  On arrival, the patient was afebrile, temperature 99.6, not tachycardic heart rate 82, paced rhythm noted on cardiac telemetry, mildly tachypneic RR 31, BP 96/46, placed on end-tidal capnography.  Saturating 95% on 2 L.  Concern for possible sepsis, generalized weakness, generalized abdominal discomfort.  Considered other etiologies of hypotension.  Differential diagnosis is broad and includes infectious etiology such as pneumonia, intra-abdominal infection, genitourinary infection, considered PE although feel less likely as the patient is without chest discomfort, lower suspicion for ACS.  Low concern for CHF exacerbation as patient has history of nonischemic cardiomyopathy which has resolved per cardiology notes in June 2024.  Small bowel obstruction.  Patient does not appear volume overloaded.  Blood pressure soft 96/46, MAP of 62 on arrival, IV access was obtained and the patient was administered a 1 L IV fluid bolus.  Infectious Initiated, initial lactic acid was elevated at 2.3, CBC with a leukocytosis to 15.2 noted.  Urinalysis pending, initial troponin elevated at 20, repeat flat at 37.  CMP without significant electrolyte abnormality, mild hyperglycemia to 239, creatinine 1.38, LFTs normal.  Initial lactic acid 2.8, downtrending to 2.3 after initial fluid resuscitation.  X-ray revealed possible  pneumonia. IMPRESSION:  Left greater than right lung opacities which could reflect pneumonia  or edema.   IV broad spectrum antibiotics were administered.  Patient pending CT of the abdomen pelvis, urinalysis at time of signout. Pt borderline hypoxic,  denies respiratory complaints, denies CP or SOB. Considered PE, CTA PE imaging ordered. Signout given to Dr. Posey Rea at [phone].   Final Clinical Impression(s) / ED Diagnoses Final diagnoses:  Hypotension, unspecified hypotension type    Rx / DC Orders ED Discharge Orders     None         Ernie Avena, MD 12/13/22 1191    Ernie Avena, MD 12/13/22 4782    Ernie Avena, MD 12/13/22 [phone]

## 2022-12-13 NOTE — H&P (Addendum)
History and Physical    Patient: [name] ION:[id number] DOB: 02/28/1941 DOA: 12/13/2022 DOS: the patient was seen and examined on 12/13/2022 PCP: [name], Adelfa Koh, MD  Patient coming from: Home  Chief Complaint:  Chief Complaint  Patient presents with   Code Sepsis   HPI: [name] is a 82 y.o. 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 Everything started after he ate sandwich, ate meatloaf, his abdomen got upset. He develops abdominal pain. Pain has now resolved, denies diarrhea. He lay  down on the floor to gets comfortable , and he couldn't stands up. Wife call EMS> > he report Dysuria, for years. He report cough, he has history of postnasal drip.   Evaluation in the ED patient was found to be hypotensive, mild elevation of troponins 20--- 37, lactic acid 2.8, leukocytosis white blood cell at 15.  Creatinine 1.3 BUN 40, glucose 239, sodium 138 potassium 4.0, bilirubin 2.2, UA with 6-10 white blood cell, large hemoglobin, CT abdomen and pelvis: Previous cholecystectomy. There is air in the biliary tree. Please correlate for any previous instrumentation. This was seen previously. Poor excretion from the kidneys with perinephric stranding. Please correlate with the patient's renal function. Splenic enlargement.  Chest x-ray left greater than right lung opacity which could represent pneumonia or edema  Review of Systems: As mentioned in the history of  present illness. All other systems reviewed and are negative. Past Medical History:  Diagnosis Date   Aortic stenosis/ bicuspid valve--s/p AVR    Ascending aortic aneurysm (HCC)    Atrial fibrillation/flutter    Biventricular implantable cardiac defibrillator- BSX    Cholelithiases    Diabetes mellitus without complication (HCC)    DJD (degenerative joint disease)    Hypertension    LBBB (left bundle branch block)    Non-ischemic cardiomyopathy (HCC)    OSA (obstructive sleep apnea)    Past Surgical History:  Procedure Laterality Date   AORTIC VALVE REPLACEMENT     APPENDECTOMY     CARDIOVERSION N/A 05/18/2021   Procedure: CARDIOVERSION;  Surgeon: Chilton Si, MD;  [facility] ENDOSCOPY;  Service: Cardiovascular;  Laterality: N/A;   CERVICAL DISCECTOMY  5/01   C7   CHOLECYSTECTOMY     EP IMPLANTABLE DEVICE N/A 11/20/2014   Procedure:  ICD Generator Changeout;  Surgeon: Duke Salvia, MD;  Location: [facility] INVASIVE CV LAB;  Service: Cardiovascular;  Laterality: N/A;   MEDIAN STERNOTOMY     TONSILLECTOMY     Social History:  reports that he has quit smoking. He has never used smokeless tobacco. He reports that he does not drink alcohol and does not use drugs.  No Known Allergies  Family History  Problem Relation Age of Onset   Emphysema Father    Leukemia Mother    Hypertension Maternal Grandmother    Stroke Maternal Grandmother    Congestive Heart Failure Sister    Diabetes Brother    Alzheimer's disease Brother     Prior to Admission medications   Medication Sig Start Date End Date Taking? Authorizing Provider  aspirin 81 MG  chewable tablet Chew by mouth daily.    [provider]  Blood Glucose Monitoring Suppl (ONETOUCH VERIO FLEX SYSTEM) w/Device KIT use to test blood sugars 01/24/17   [provider]  carvedilol (COREG) 12.5 MG tablet Take 1 tablet (12.5 mg total) by mouth 2 (two) times daily with a meal. 07/16/22   Crenshaw, Madolyn Frieze, MD   dapagliflozin propanediol (FARXIGA) 10 MG TABS tablet Take 1 tablet (10 mg total) by mouth daily. 11/26/22     furosemide (LASIX) 40 MG tablet Take 1 tablet by mouth once daily. Take an extra tablet as needed for swelling or shortness of breath 08/20/22   Lewayne Bunting, MD  glucose blood (ONETOUCH VERIO) test strip Use to test blood sugar once a day. 10/21/22     lisinopril (ZESTRIL) 40 MG tablet Take 1 tablet (40 mg total) by mouth daily. 11/26/22     metFORMIN (GLUCOPHAGE) 1000 MG tablet Take 1 tablet (1,000 mg total) by mouth 2 (two) times daily. 05/31/22     Semaglutide, 1 MG/DOSE, (OZEMPIC, 1 MG/DOSE,) 4 MG/3ML SOPN Inject 1 mg into the skin once a week. 12/03/22     spironolactone (ALDACTONE) 25 MG tablet TAKE 1 TABLET BY MOUTH ONCE DAILY 08/20/22 08/20/23  Duke Salvia, MD    Physical Exam: Vitals:   12/13/22 1615 12/13/22 1630 12/13/22 1645 12/13/22 1700  BP: (!) 96/58 (!) 95/52 (!) 104/50 104/62  Pulse: 82 80 (!) 50 79  Resp: (!) 21 20 (!) 23 17  Temp:      TempSrc:      SpO2: 100% 96% 98% 98%  Weight:      Height:       General; Alert, pleasant.  CVS: S 1 S 2 RRR, negative JVD Lung: CTA, normal respiratory effort Abdomen: BS present, soft, nt Extremities: no edema Neuro; non focal.   Data Reviewed:  Labs and imagine reviewed.   Assessment and Plan: No notes have been filed under this hospital service. Service: Hospitalist  1-Sepsis secondary to UTI Vs PNA Hypotension Patient presents with chills, leukocytosis, hypotension SBP in the 80. Chest x ray showed PNA vs edema, UA 6-10 WBC> CT abdomen pelvis; Scattered colonic stool.  No bowel obstruction.  No free air. Previous cholecystectomy. There is air in the biliary tree. Please correlate for any previous instrumentation. This was seen previously. Poor excretion from the kidneys with perinephric stranding.  -Continue with IV fluids.  -admit to progressive bed.  -IV ceftriaxone and zither to cover for PNA and  UTI -Follow urine culture, blood culture.    2-Mild elevation of troponin;  In setting sepsis.  Denies chest pain.   3-Diabetes; hold oral Medications. SSI.  4-Lactic acidosis; in setting of sepsis.  6-HTN; hold BP medications due to hypotension.  7-H/O non ischemic cardiomyopathy. Improvement of EF last ECHO.  H/O AV replacement bovine, 2009     Advance Care Planning:   Code Status: Full Code Wishes to be Full code  Consults: None  Family Communication: care discussed with patient.   Severity of Illness: The appropriate patient status for this patient is OBSERVATION. Observation status is judged to be reasonable and necessary in order to provide the required intensity of service to ensure the patient's safety. The patient's presenting symptoms, physical exam findings, and initial radiographic and laboratory data in the context of their medical condition is felt to place them at decreased risk for further clinical deterioration. Furthermore, it is anticipated that the patient will be medically  stable for discharge from the hospital within 2 midnights of admission.   Author: Alba Cory, MD 12/13/2022 5:13 PM  For on call review [email or web address].

## 2022-12-13 NOTE — ED Notes (Signed)
Patient noted to be trending hypotensive, ED provider notified, and advised that they would treat with vasopressors if his MAP was consistently below 60. BP has been fluctuating and has not been consistently with a MAP under 60.

## 2022-12-13 NOTE — ED Notes (Signed)
ED TO INPATIENT HANDOFF REPORT  ED Nurse Name and Phone #: Murlean Iba Paramedic   S Name/Age/Gender [name] 82 y.o. male Room/Bed: 034C/034C  Code Status   Code Status: Prior  Home/SNF/Other Home Patient oriented to: self, place, time, and situation Is this baseline? Yes   Triage Complete: Triage complete  Chief Complaint Sepsis [facility]) [A41.9]  Triage Note Patient reports difficulty urinating  Febrile hypotensive  paced rhythm  Patient did report vomtiing x 1    Allergies No Known Allergies  Level of Care/Admitting Diagnosis ED Disposition     ED Disposition  Admit   Condition  --   Comment  Hospital Area: [facility] [[id number]]  Level of Care: Progressive [102]  Admit to Progressive based on following criteria: CARDIOVASCULAR & THORACIC of moderate stability with acute coronary syndrome symptoms/low risk myocardial infarction/hypertensive urgency/arrhythmias/heart failure potentially compromising stability and stable post cardiovascular intervention patients.  May place patient in observation at [facility] or [facility] if equivalent level of care is available:: No  Covid Evaluation: Asymptomatic - no recent exposure (last 10 days) testing not required  Diagnosis: Sepsis [facility]) [[id number]]  Admitting Physician: Alba Cory [id number]  Attending Physician: Alba Cory [phone]          B Medical/Surgery History Past Medical History:  Diagnosis Date   Aortic stenosis/ bicuspid valve--s/p AVR    Ascending aortic aneurysm (HCC)    Atrial fibrillation/flutter    Biventricular implantable cardiac defibrillator- BSX    Cholelithiases    Diabetes mellitus without complication (HCC)    DJD (degenerative joint disease)    Hypertension    LBBB (left bundle Alexia Dinger block)    Non-ischemic cardiomyopathy (HCC)    OSA (obstructive sleep apnea)    Past Surgical History:  Procedure Laterality Date   AORTIC VALVE REPLACEMENT     APPENDECTOMY      CARDIOVERSION N/A 05/18/2021   Procedure: CARDIOVERSION;  Surgeon: Chilton Si, MD;  Location: [facility] ENDOSCOPY;  Service: Cardiovascular;  Laterality: N/A;   CERVICAL DISCECTOMY  5/01   C7   CHOLECYSTECTOMY     EP IMPLANTABLE DEVICE N/A 11/20/2014   Procedure:  ICD Generator Changeout;  Surgeon: Duke Salvia, MD;  Location: [facility] INVASIVE CV LAB;  Service: Cardiovascular;  Laterality: N/A;   MEDIAN STERNOTOMY     TONSILLECTOMY       A IV Location/Drains/Wounds Patient Lines/Drains/Airways Status     Active Line/Drains/Airways     Name Placement date Placement time Site Days   Peripheral IV 12/13/22 18 G Right Antecubital 12/13/22  --  Antecubital  less than 1   Peripheral IV 12/13/22 20 G 1" Anterior;Right Forearm 12/13/22  1224  Forearm  less than 1   Peripheral IV 12/13/22 20 G 1" Anterior;Left Forearm 12/13/22  1230  Forearm  less than 1            Intake/Output Last 24 hours  Intake/Output Summary (Last 24 hours) at 12/13/2022 1650 Last data filed at 12/13/2022 1629 Gross per 24 hour  Intake 2100 ml  Output --  Net 2100 ml    Labs/Imaging Results for orders placed or performed during the hospital encounter of 12/13/22 (from the past 48 hour(s))  Troponin I (High Sensitivity)     Status: Abnormal   Collection Time: 12/13/22 12:28 PM  Result Value Ref Range   Troponin I (High Sensitivity) 20 (H) <18 ng/L    Comment: (NOTE) Elevated high sensitivity troponin I (hsTnI) values and  significant  changes across serial measurements may suggest ACS but many other  chronic and acute conditions are known to elevate hsTnI results.  Refer to the "Links" section for chest pain algorithms and additional  guidance. Performed at [facility], [address], [place], [place] [address]   Comprehensive metabolic panel     Status: Abnormal   Collection Time: 12/13/22 12:32 PM  Result Value Ref Range   Sodium 138 135 - 145 mmol/L   Potassium 4.0 3.5 - 5.1 mmol/L    Chloride 105 98 - 111 mmol/L   CO2 23 22 - 32 mmol/L   Glucose, Bld 239 (H) 70 - 99 mg/dL    Comment: Glucose reference range applies only to samples taken after fasting for at least 8 hours.   BUN 40 (H) 8 - 23 mg/dL   Creatinine, Ser 2.13 (H) 0.61 - 1.24 mg/dL   Calcium 8.7 (L) 8.9 - 10.3 mg/dL   Total Protein 6.0 (L) 6.5 - 8.1 g/dL   Albumin 3.5 3.5 - 5.0 g/dL   AST 32 15 - 41 U/L   ALT 43 0 - 44 U/L   Alkaline Phosphatase 77 38 - 126 U/L   Total Bilirubin 2.2 (H) 0.3 - 1.2 mg/dL   GFR, Estimated 51 (L) >60 mL/min    Comment: (NOTE) Calculated using the CKD-EPI Creatinine Equation (2021)    Anion gap 10 5 - 15    Comment: Performed at [facility], [address], [place], [place] [address]  CBC with Differential     Status: Abnormal   Collection Time: 12/13/22 12:32 PM  Result Value Ref Range   WBC 15.2 (H) 4.0 - 10.5 K/uL   RBC 4.70 4.22 - 5.81 MIL/uL   Hemoglobin 13.6 13.0 - 17.0 g/dL   HCT 84.6 96.2 - 95.2 %   MCV 87.7 80.0 - 100.0 fL   MCH 28.9 26.0 - 34.0 pg   MCHC 33.0 30.0 - 36.0 g/dL   RDW 84.1 32.4 - 40.1 %   Platelets 176 150 - 400 K/uL   nRBC 0.0 0.0 - 0.2 %   Neutrophils Relative % 90 %   Neutro Abs 13.6 (H) 1.7 - 7.7 K/uL   Lymphocytes Relative 1 %   Lymphs Abs 0.2 (L) 0.7 - 4.0 K/uL   Monocytes Relative 8 %   Monocytes Absolute 1.2 (H) 0.1 - 1.0 K/uL   Eosinophils Relative 0 %   Eosinophils Absolute 0.0 0.0 - 0.5 K/uL   Basophils Relative 0 %   Basophils Absolute 0.0 0.0 - 0.1 K/uL   Immature Granulocytes 1 %   Abs Immature Granulocytes 0.10 (H) 0.00 - 0.07 K/uL    Comment: Performed at [facility], [address], [place], [place] [address]  Protime-INR     Status: None   Collection Time: 12/13/22 12:32 PM  Result Value Ref Range   Prothrombin Time 14.3 11.4 - 15.2 seconds   INR 1.1 0.8 - 1.2    Comment: (NOTE) INR goal varies based on device and disease states. Performed at [facility], [address] [place],  [place] [address]   APTT     Status: None   Collection Time: 12/13/22 12:32 PM  Result Value Ref Range   aPTT 27 24 - 36 seconds    Comment: Performed at [facility], [address] [place], [place] [address]  I-Stat Lactic Acid, ED     Status: Abnormal  Collection Time: 12/13/22 12:40 PM  Result Value Ref Range   Lactic Acid, Venous 2.8 (HH) 0.5 - 1.9 mmol/L   Comment NOTIFIED PHYSICIAN   I-stat chem 8, ED (not at [facility], [facility] or [facility])     Status: Abnormal   Collection Time: 12/13/22 12:41 PM  Result Value Ref Range   Sodium 141 135 - 145 mmol/L   Potassium 4.0 3.5 - 5.1 mmol/L   Chloride 106 98 - 111 mmol/L   BUN 39 (H) 8 - 23 mg/dL   Creatinine, Ser 1.32 (H) 0.61 - 1.24 mg/dL   Glucose, Bld 440 (H) 70 - 99 mg/dL    Comment: Glucose reference range applies only to samples taken after fasting for at least 8 hours.   Calcium, Ion 1.16 1.15 - 1.40 mmol/L   TCO2 22 22 - 32 mmol/L   Hemoglobin 13.3 13.0 - 17.0 g/dL   HCT 10.2 72.5 - 36.6 %  I-Stat Lactic Acid, ED     Status: Abnormal   Collection Time: 12/13/22  2:03 PM  Result Value Ref Range   Lactic Acid, Venous 2.3 (HH) 0.5 - 1.9 mmol/L   Comment NOTIFIED PHYSICIAN   Troponin I (High Sensitivity)     Status: Abnormal   Collection Time: 12/13/22  2:31 PM  Result Value Ref Range   Troponin I (High Sensitivity) 37 (H) <18 ng/L    Comment: (NOTE) Elevated high sensitivity troponin I (hsTnI) values and significant  changes across serial measurements may suggest ACS but many other  chronic and acute conditions are known to elevate hsTnI results.  Refer to the "Links" section for chest pain algorithms and additional  guidance. Performed at [facility], [address] [place], [place] [address]   Urinalysis, w/ Reflex to Culture (Infection Suspected) -Urine, Clean Catch     Status: Abnormal   Collection Time: 12/13/22  3:19 PM  Result Value Ref Range   Specimen Source URINE, CLEAN CATCH    Color, Urine AMBER (A) YELLOW     Comment: BIOCHEMICALS MAY BE AFFECTED BY COLOR   APPearance CLEAR CLEAR   Specific Gravity, Urine 1.036 (H) 1.005 - 1.030   pH 5.0 5.0 - 8.0   Glucose, UA >=500 (A) NEGATIVE mg/dL   Hgb urine dipstick LARGE (A) NEGATIVE   Bilirubin Urine NEGATIVE NEGATIVE   Ketones, ur NEGATIVE NEGATIVE mg/dL   Protein, ur NEGATIVE NEGATIVE mg/dL   Nitrite NEGATIVE NEGATIVE   Leukocytes,Ua NEGATIVE NEGATIVE   RBC / HPF >50 0 - 5 RBC/hpf   WBC, UA 6-10 0 - 5 WBC/hpf    Comment:        Reflex urine culture not performed if WBC <=10, OR if Squamous epithelial cells >5. If Squamous epithelial cells >5 suggest recollection.    Bacteria, UA RARE (A) NONE SEEN   Squamous Epithelial / HPF 0-5 0 - 5 /HPF   Mucus PRESENT    Hyaline Casts, UA PRESENT     Comment: Performed at [facility], [address] [place], [place] [address]   CT ABDOMEN PELVIS W CONTRAST  Result Date: 12/13/2022 CLINICAL DATA:  Bowel obstruction.  Pain EXAM: CT ABDOMEN AND PELVIS WITH CONTRAST TECHNIQUE: Multidetector CT imaging of the abdomen and pelvis was performed using the standard protocol following bolus administration of intravenous contrast. RADIATION DOSE REDUCTION: This exam was performed according to the departmental dose-optimization program which includes automated exposure control, adjustment of the mA and/or kV according to patient size and/or use  of iterative reconstruction technique. CONTRAST:  75mL OMNIPAQUE IOHEXOL 350 MG/ML SOLN COMPARISON:  None Available. FINDINGS: Lower chest: Heart is mildly enlarged. Defibrillator leads along the heart. There is some linear opacity at the bases likely scar or atelectasis. Hepatobiliary: Diffuse fatty liver infiltration. Previous cholecystectomy. Patent portal vein. Air in the biliary tree. There is some low density seen in the area of the gallbladder fossa but this could be focal fat deposition. Pancreas: Unremarkable. No pancreatic ductal dilatation or surrounding  inflammatory changes. Spleen: Splenic granulomas are identified. Spleen is enlarged at 16.0 cm. Adrenals/Urinary Tract: Slight thickening of the left adrenal gland. Right adrenal gland is preserved. Moderate perinephric stranding. No enhancing mass or collecting system dilatation. However there is delayed excretion of contrast on the delayed dataset. Please correlate with the patient's renal function. The ureters have normal course and caliber extending down to the bladder. Preserved contours of the urinary bladder. Bladder is underdistended. Stomach/Bowel: On this non oral contrast exam, large bowel has a normal course and caliber with scattered stool. Small bowel is nondilated. Stomach is underdistended. Second portion duodenal diverticulum. Vascular/Lymphatic: Scattered vascular calcifications are seen. Normal caliber aorta and IVC. No discrete abnormal lymph node enlargement identified in the abdomen and pelvis. Reproductive: Prominent prostate with mass effect along the base of the bladder. Other: No free air or free fluid. Small bilateral fat containing inguinal hernias. Musculoskeletal: Osteopenia. Scattered degenerative changes of the spine and pelvis. Trace retrolisthesis of L3 on L4. IMPRESSION: Scattered colonic stool.  No bowel obstruction.  No free air. Previous cholecystectomy. There is air in the biliary tree. Please correlate for any previous instrumentation. This was seen previously. Poor excretion from the kidneys with perinephric stranding. Please correlate with the patient's renal function. Splenic enlargement. Electronically Signed   By: Karen Kays M.D.   On: 12/13/2022 16:05   DG Chest Port 1 View  Result Date: 12/13/2022 CLINICAL DATA:  Questionable sepsis. Difficulty urinating, fever, hypotension, vomiting, and shortness of breath. EXAM: PORTABLE CHEST 1 VIEW COMPARISON:  Chest radiographs 11/25/2017 and CT 12/25/2021 FINDINGS: A 3 lead pacemaker/ICD remains in place. The cardiac  silhouette is borderline enlarged. Predominantly interstitial type opacities are present in the left greater than right lungs. No sizable pleural effusion or pneumothorax is identified. Prior cervical spine fusion is noted. IMPRESSION: Left greater than right lung opacities which could reflect pneumonia or edema. Electronically Signed   By: Sebastian Ache M.D.   On: 12/13/2022 13:40    Pending Labs Unresulted Labs (From admission, onward)     Start     Ordered   12/13/22 1630  Urine Culture (for pregnant, neutropenic or urologic patients or patients with an indwelling urinary catheter)  (Urine Labs)  Once,   URGENT       Question:  Indication  Answer:  Dysuria   12/13/22 1629   12/13/22 1207  Blood Culture (routine x 2)  (Undifferentiated presentation (screening labs and basic nursing orders))  BLOOD CULTURE X 2,   STAT      12/13/22 1208            Vitals/Pain Today's Vitals   12/13/22 1600 12/13/22 1608 12/13/22 1615 12/13/22 1630  BP: (!) 95/51  (!) 96/58 (!) 95/52  Pulse: 76  82 80  Resp: 17  (!) 21 20  Temp:  98.1 F (36.7 C)    TempSrc:  Oral    SpO2: 100%  100% 96%  Weight:      Height:  PainSc:        Isolation Precautions No active isolations  Medications Medications  ondansetron (ZOFRAN) injection 4 mg (has no administration in time range)  vancomycin (VANCOREADY) IVPB 1500 mg/300 mL (1,500 mg Intravenous New Bag/Given 12/13/22 1613)  cefTRIAXone (ROCEPHIN) 2 g in sodium chloride 0.9 % 100 mL IVPB (has no administration in time range)  lactated ringers infusion ( Intravenous New Bag/Given 12/13/22 1647)  lactated ringers bolus 1,000 mL (0 mLs Intravenous Stopped 12/13/22 1349)  iohexol (OMNIPAQUE) 350 MG/ML injection 75 mL (75 mLs Intravenous Contrast Given 12/13/22 1416)  lactated ringers bolus 1,000 mL (0 mLs Intravenous Stopped 12/13/22 1614)  ceFEPIme (MAXIPIME) 2 g in sodium chloride 0.9 % 100 mL IVPB (0 g Intravenous Stopped 12/13/22 1629)    Mobility walks      Focused Assessments     R Recommendations: See Admitting Provider Note  Report given to:   Additional Notes:

## 2022-12-14 DIAGNOSIS — E872 Acidosis, unspecified: Secondary | ICD-10-CM | POA: Diagnosis present

## 2022-12-14 DIAGNOSIS — R161 Splenomegaly, not elsewhere classified: Secondary | ICD-10-CM | POA: Diagnosis present

## 2022-12-14 DIAGNOSIS — Z825 Family history of asthma and other chronic lower respiratory diseases: Secondary | ICD-10-CM | POA: Diagnosis not present

## 2022-12-14 DIAGNOSIS — B9620 Unspecified Escherichia coli [E. coli] as the cause of diseases classified elsewhere: Secondary | ICD-10-CM | POA: Diagnosis not present

## 2022-12-14 DIAGNOSIS — Z820 Family history of epilepsy and other diseases of the nervous system: Secondary | ICD-10-CM | POA: Diagnosis not present

## 2022-12-14 DIAGNOSIS — A419 Sepsis, unspecified organism: Secondary | ICD-10-CM | POA: Diagnosis present

## 2022-12-14 DIAGNOSIS — R7881 Bacteremia: Secondary | ICD-10-CM

## 2022-12-14 DIAGNOSIS — I4819 Other persistent atrial fibrillation: Secondary | ICD-10-CM | POA: Diagnosis not present

## 2022-12-14 DIAGNOSIS — Z87891 Personal history of nicotine dependence: Secondary | ICD-10-CM | POA: Diagnosis not present

## 2022-12-14 DIAGNOSIS — G4733 Obstructive sleep apnea (adult) (pediatric): Secondary | ICD-10-CM | POA: Diagnosis present

## 2022-12-14 DIAGNOSIS — N390 Urinary tract infection, site not specified: Secondary | ICD-10-CM | POA: Diagnosis present

## 2022-12-14 DIAGNOSIS — L89322 Pressure ulcer of left buttock, stage 2: Secondary | ICD-10-CM | POA: Diagnosis present

## 2022-12-14 DIAGNOSIS — I4891 Unspecified atrial fibrillation: Secondary | ICD-10-CM | POA: Diagnosis present

## 2022-12-14 DIAGNOSIS — I428 Other cardiomyopathies: Secondary | ICD-10-CM | POA: Diagnosis present

## 2022-12-14 DIAGNOSIS — Z7982 Long term (current) use of aspirin: Secondary | ICD-10-CM | POA: Diagnosis not present

## 2022-12-14 DIAGNOSIS — I959 Hypotension, unspecified: Secondary | ICD-10-CM | POA: Diagnosis present

## 2022-12-14 DIAGNOSIS — A4151 Sepsis due to Escherichia coli [E. coli]: Secondary | ICD-10-CM | POA: Diagnosis present

## 2022-12-14 DIAGNOSIS — Z823 Family history of stroke: Secondary | ICD-10-CM | POA: Diagnosis not present

## 2022-12-14 DIAGNOSIS — Z953 Presence of xenogenic heart valve: Secondary | ICD-10-CM | POA: Diagnosis not present

## 2022-12-14 DIAGNOSIS — E119 Type 2 diabetes mellitus without complications: Secondary | ICD-10-CM | POA: Diagnosis present

## 2022-12-14 DIAGNOSIS — Z8249 Family history of ischemic heart disease and other diseases of the circulatory system: Secondary | ICD-10-CM | POA: Diagnosis not present

## 2022-12-14 DIAGNOSIS — Z833 Family history of diabetes mellitus: Secondary | ICD-10-CM | POA: Diagnosis not present

## 2022-12-14 DIAGNOSIS — R7989 Other specified abnormal findings of blood chemistry: Secondary | ICD-10-CM | POA: Diagnosis present

## 2022-12-14 DIAGNOSIS — Z9049 Acquired absence of other specified parts of digestive tract: Secondary | ICD-10-CM | POA: Diagnosis not present

## 2022-12-14 DIAGNOSIS — Z806 Family history of leukemia: Secondary | ICD-10-CM | POA: Diagnosis not present

## 2022-12-14 DIAGNOSIS — I10 Essential (primary) hypertension: Secondary | ICD-10-CM | POA: Diagnosis present

## 2022-12-14 DIAGNOSIS — Z95810 Presence of automatic (implantable) cardiac defibrillator: Secondary | ICD-10-CM | POA: Diagnosis not present

## 2022-12-14 DIAGNOSIS — R6520 Severe sepsis without septic shock: Secondary | ICD-10-CM | POA: Diagnosis present

## 2022-12-14 LAB — COMPREHENSIVE METABOLIC PANEL
ALT: 39 U/L (ref 0–44)
AST: 32 U/L (ref 15–41)
Albumin: 2.8 g/dL — ABNORMAL LOW (ref 3.5–5.0)
Alkaline Phosphatase: 58 U/L (ref 38–126)
Anion gap: 10 (ref 5–15)
BUN: 38 mg/dL — ABNORMAL HIGH (ref 8–23)
CO2: 23 mmol/L (ref 22–32)
Calcium: 8.1 mg/dL — ABNORMAL LOW (ref 8.9–10.3)
Chloride: 102 mmol/L (ref 98–111)
Creatinine, Ser: 1.31 mg/dL — ABNORMAL HIGH (ref 0.61–1.24)
GFR, Estimated: 54 mL/min — ABNORMAL LOW (ref >60)
Glucose, Bld: 138 mg/dL — ABNORMAL HIGH (ref 70–99)
Potassium: 3.6 mmol/L (ref 3.5–5.1)
Sodium: 135 mmol/L (ref 135–145)
Total Bilirubin: 1.4 mg/dL — ABNORMAL HIGH (ref 0.3–1.2)
Total Protein: 5 g/dL — ABNORMAL LOW (ref 6.5–8.1)

## 2022-12-14 LAB — CBC
HCT: 34.5 % — ABNORMAL LOW (ref 39.0–52.0)
Hemoglobin: 11.3 g/dL — ABNORMAL LOW (ref 13.0–17.0)
MCH: 29 pg (ref 26.0–34.0)
MCHC: 32.8 g/dL (ref 30.0–36.0)
MCV: 88.5 fL (ref 80.0–100.0)
Platelets: 151 10*3/uL (ref 150–400)
RBC: 3.9 MIL/uL — ABNORMAL LOW (ref 4.22–5.81)
RDW: 14.1 % (ref 11.5–15.5)
WBC: 9.6 10*3/uL (ref 4.0–10.5)
nRBC: 0 % (ref 0.0–0.2)

## 2022-12-14 LAB — GLUCOSE, CAPILLARY
Glucose-Capillary: 108 mg/dL — ABNORMAL HIGH (ref 70–99)
Glucose-Capillary: 174 mg/dL — ABNORMAL HIGH (ref 70–99)
Glucose-Capillary: 133 mg/dL — ABNORMAL HIGH (ref 70–99)
Glucose-Capillary: 182 mg/dL — ABNORMAL HIGH (ref 70–99)

## 2022-12-14 LAB — BLOOD CULTURE ID PANEL (REFLEXED) - BCID2

## 2022-12-14 LAB — URINE CULTURE: Culture: NO GROWTH

## 2022-12-14 MED ORDER — ENOXAPARIN SODIUM 40 MG/0.4ML IJ SOSY
40.0000 mg | PREFILLED_SYRINGE | INTRAMUSCULAR | Status: DC
  Administered 2022-12-14 – 2022-12-15 (×2): 40 mg via SUBCUTANEOUS
  Filled 2022-12-14 (×2): qty 0.4

## 2022-12-14 MED ORDER — GUAIFENESIN ER 600 MG PO TB12
600.0000 mg | ORAL_TABLET | Freq: Two times a day (BID) | ORAL | Status: DC
  Administered 2022-12-14 – 2022-12-16 (×5): 600 mg via ORAL
  Filled 2022-12-14 (×5): qty 1

## 2022-12-14 MED ORDER — IBUPROFEN 200 MG PO TABS
400.0000 mg | ORAL_TABLET | Freq: Once | ORAL | Status: AC
  Administered 2022-12-14: 400 mg via ORAL
  Filled 2022-12-14: qty 2

## 2022-12-14 MED ORDER — FLUTICASONE PROPIONATE 50 MCG/ACT NA SUSP
1.0000 | Freq: Every day | NASAL | Status: DC
  Administered 2022-12-14 – 2022-12-16 (×3): 1 via NASAL
  Filled 2022-12-14: qty 16

## 2022-12-14 MED ORDER — SODIUM CHLORIDE 0.9 % IV SOLN
2.0000 g | INTRAVENOUS | Status: DC
  Administered 2022-12-14 – 2022-12-15 (×2): 2 g via INTRAVENOUS
  Filled 2022-12-14 (×2): qty 20

## 2022-12-14 NOTE — Consult Note (Addendum)
[facility]    Date of Admission:  12/13/2022     Reason for Consult: GNR bacteremia     Referring Provider: Jon Billings Regalado,MD    Abx:  9/9 Ceftriaxone  9/9 Azithromycin       Assessment: [name] is a 82 y.o. male with icd here with sepsis and ecoli bacteremia. He denies diarrhea or abdominal pain.  Plan: Repeat Bcx 2.   Plan for TTE if blood cultures is positive. 3.  Continue Ceftriaxone and Azithromycin ------------------------------------------------ Principal Problem:   Sepsis (HCC) Active Problems:   SLEEP APNEA, OBSTRUCTIVE   Essential hypertension, benign   Atrial fibrillation (HCC)   PNA (pneumonia)   UTI (urinary tract infection)    HPI: [name] is a 82 y.o. male with medical history significant of past medical history significant for aortic stenosis bicuspid valve status post AVR, porcine aortic valve replacement, status post ICD,, A-fib a flutter, biventricular implantable cardiac defibrillator, who presents to the ED with 1 day of Abd pain,weakness and rigors and found to have E.coli bacteremia of unclear source.   Family History  Problem Relation Age of Onset   Emphysema Father    Leukemia Mother    Hypertension Maternal Grandmother    Stroke Maternal Grandmother    Congestive Heart Failure Sister    Diabetes Brother    Alzheimer's disease Brother     Social History   Tobacco Use   Smoking status: Former   Smokeless tobacco: Never  Building services engineer status: Never Used  Substance Use Topics   Alcohol use: No   Drug use: No    No Known Allergies  Review of Systems: ROS All Other ROS was negative, except mentioned above   Past Medical History:  Diagnosis Date   Aortic stenosis/ bicuspid valve--s/p AVR    Ascending aortic aneurysm (HCC)    Atrial fibrillation/flutter    Biventricular implantable cardiac defibrillator- BSX    Cholelithiases    Diabetes mellitus without complication  (HCC)    DJD (degenerative joint disease)    Hypertension    LBBB (left bundle branch block)    Non-ischemic cardiomyopathy (HCC)    OSA (obstructive sleep apnea)        Scheduled Meds:  enoxaparin (LOVENOX) injection  40 mg Subcutaneous Q24H   feeding supplement  237 mL Oral BID BM   fluticasone  1 spray Each Nare Daily   guaiFENesin  600 mg Oral BID   insulin aspart  0-9 Units Subcutaneous TID WC   Continuous Infusions:  cefTRIAXone (ROCEPHIN)  IV     PRN Meds:.acetaminophen **OR** acetaminophen, albuterol, HYDROcodone-acetaminophen, ondansetron (ZOFRAN) IV, ondansetron **OR** ondansetron (ZOFRAN) IV, polyethylene glycol   OBJECTIVE: Blood pressure (!) 107/53, pulse 70, temperature 98.7 F (37.1 C), temperature source Oral, resp. rate 18, height 5\' 10"  (1.778 m), weight 89.9 kg, SpO2 96%.  Physical Exam  CV: RRR. No murmurs, rubs, or gallops. No LE edema Pulmonary: Lungs clear bilaterally. Abdominal: Soft, nontender, nondistended. Normal bowel sounds. Extremities: Palpable pulses. Normal ROM. Skin: Warm and dry. No obvious rash or lesions.  Lab Results Lab Results  Component Value Date   WBC 9.6 12/14/2022   HGB 11.3 (L) 12/14/2022   HCT 34.5 (L) 12/14/2022   MCV 88.5 12/14/2022   PLT 151 12/14/2022    Lab Results  Component Value Date   CREATININE 1.31 (H) 12/14/2022   BUN 38 (H) 12/14/2022   NA 135 12/14/2022  K 3.6 12/14/2022   CL 102 12/14/2022   CO2 23 12/14/2022    Lab Results  Component Value Date   ALT 39 12/14/2022   AST 32 12/14/2022   ALKPHOS 58 12/14/2022   BILITOT 1.4 (H) 12/14/2022      Microbiology: Recent Results (from the past 240 hour(s))  Blood Culture (routine x 2)     Status: None (Preliminary result)   Collection Time: 12/13/22 12:32 PM   Specimen: BLOOD  Result Value Ref Range Status   Specimen Description BLOOD BLOOD RIGHT ARM  Final   Special Requests   Final    BOTTLES DRAWN AEROBIC AND ANAEROBIC Blood Culture results  may not be optimal due to an excessive volume of blood received in culture bottles   Culture  Setup Time   Final    GRAM NEGATIVE RODS ANAEROBIC BOTTLE ONLY CRITICAL RESULT CALLED TO, READ BACK BY AND VERIFIED WITH: [place] Nickel [phone] AT 0825 BY EC Performed at [facility], [address] [place], [place] [address]    Culture GRAM NEGATIVE RODS  Final   Report Status PENDING  Incomplete  Blood Culture ID Panel (Reflexed)     Status: Abnormal   Collection Time: 12/13/22 12:32 PM  Result Value Ref Range Status   Enterococcus faecalis NOT DETECTED NOT DETECTED Final   Enterococcus Faecium NOT DETECTED NOT DETECTED Final   Listeria monocytogenes NOT DETECTED NOT DETECTED Final   Staphylococcus species NOT DETECTED NOT DETECTED Final   Staphylococcus aureus (BCID) NOT DETECTED NOT DETECTED Final   Staphylococcus epidermidis NOT DETECTED NOT DETECTED Final   Staphylococcus lugdunensis NOT DETECTED NOT DETECTED Final   Streptococcus species NOT DETECTED NOT DETECTED Final   Streptococcus agalactiae NOT DETECTED NOT DETECTED Final   Streptococcus pneumoniae NOT DETECTED NOT DETECTED Final   Streptococcus pyogenes NOT DETECTED NOT DETECTED Final   A.calcoaceticus-baumannii NOT DETECTED NOT DETECTED Final   Bacteroides fragilis NOT DETECTED NOT DETECTED Final   Enterobacterales DETECTED (A) NOT DETECTED Final    Comment: Enterobacterales represent a large order of gram negative bacteria, not a single organism. CRITICAL RESULT CALLED TO, READ BACK BY AND VERIFIED WITH: PHARMD BAILEY Kostka [phone] AT 0825 BY EC    Enterobacter cloacae complex NOT DETECTED NOT DETECTED Final   Escherichia coli DETECTED (A) NOT DETECTED Final    Comment: CRITICAL RESULT CALLED TO, READ BACK BY AND VERIFIED WITH: PHARMD BAILEY Bastien [phone] AT 0825 BY EC    Klebsiella aerogenes NOT DETECTED NOT DETECTED Final   Klebsiella oxytoca NOT DETECTED NOT DETECTED Final   Klebsiella pneumoniae NOT  DETECTED NOT DETECTED Final   Proteus species NOT DETECTED NOT DETECTED Final   Salmonella species NOT DETECTED NOT DETECTED Final   Serratia marcescens NOT DETECTED NOT DETECTED Final   Haemophilus influenzae NOT DETECTED NOT DETECTED Final   Neisseria meningitidis NOT DETECTED NOT DETECTED Final   Pseudomonas aeruginosa NOT DETECTED NOT DETECTED Final   Stenotrophomonas maltophilia NOT DETECTED NOT DETECTED Final   Candida albicans NOT DETECTED NOT DETECTED Final   Candida auris NOT DETECTED NOT DETECTED Final   Candida glabrata NOT DETECTED NOT DETECTED Final   Candida krusei NOT DETECTED NOT DETECTED Final   Candida parapsilosis NOT DETECTED NOT DETECTED Final   Candida tropicalis NOT DETECTED NOT DETECTED Final   Cryptococcus neoformans/gattii NOT DETECTED NOT DETECTED Final   CTX-M ESBL NOT DETECTED NOT DETECTED Final   Carbapenem resistance IMP NOT DETECTED NOT DETECTED Final   Carbapenem  resistance KPC NOT DETECTED NOT DETECTED Final   Carbapenem resistance NDM NOT DETECTED NOT DETECTED Final   Carbapenem resist OXA 48 LIKE NOT DETECTED NOT DETECTED Final   Carbapenem resistance VIM NOT DETECTED NOT DETECTED Final    Comment: Performed at [facility], [address], [place], [place] [address]  Blood Culture (routine x 2)     Status: None (Preliminary result)   Collection Time: 12/13/22 12:33 PM   Specimen: BLOOD  Result Value Ref Range Status   Specimen Description BLOOD BLOOD LEFT ARM  Final   Special Requests   Final    BOTTLES DRAWN AEROBIC AND ANAEROBIC Blood Culture results may not be optimal due to an excessive volume of blood received in culture bottles   Culture   Final    NO GROWTH < 24 HOURS Performed at [facility], [address], [place], [place] [address]    Report Status PENDING  Incomplete  Urine Culture (for pregnant, neutropenic or urologic patients or patients with an indwelling urinary catheter)     Status: None   Collection Time: 12/13/22   4:30 PM   Specimen: Urine, Clean Catch  Result Value Ref Range Status   Specimen Description URINE, CLEAN CATCH  Final   Special Requests NONE  Final   Culture   Final    NO GROWTH Performed at [facility], [address] [place], [place] [address]    Report Status 12/14/2022 FINAL  Final     Serology:   Imaging: If present, new imagings (plain films, ct scans, and mri) have been personally visualized and interpreted; radiology reports have been reviewed. Decision making incorporated into the Impression / Recommendations.   Laretta Bolster, MD [facility] [phone] pager    12/14/2022, 1:53 PM

## 2022-12-14 NOTE — Progress Notes (Signed)
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  Evaluation in the ED he was hypotensive, lactic acidosis lactic acid at 2.8, leukocytosis white blood cell 15, CT abdomen and pelvis previous cholecystectomy pneumobilia, perinephric stranding of the Kidneys, chest x-ray left greater than right opacity could represent pneumonia or edema.  UA with 6-10 white blood cell, large hemoglobin.  Patient admitted with sepsis thought to be related to UTI versus pneumonia subsequently found to have E. coli bacteremia.    Assessment & Plan:   Principal Problem:   Sepsis (HCC) Active Problems:   SLEEP APNEA, OBSTRUCTIVE   Essential hypertension, benign   Atrial fibrillation (HCC)   PNA (pneumonia)   UTI (urinary tract infection)   1-Sepsis secondary to UTI Vs PNA, POA -Hypotension Patient presents with chills, leukocytosis, hypotension SBP in the 80. Chest x ray showed PNA vs edema, UA 6-10 WBC> CT abdomen pelvis; Scattered colonic stool.  No bowel obstruction.  No free air. Previous cholecystectomy. There is air in the biliary tree. Please correlate for any previous instrumentation. This was seen previously. Poor excretion from the kidneys with perinephric stranding.  -Continue with IV fluids.  -IV ceftriaxone and zither to cover for PNA and UTI -urine culture, blood culture. Growing E coli.  -ID consulted . Blood culture repeated.   -WBC trending down.   2-Mild elevation of troponin;  In setting sepsis.  Denies chest pain.    3-Diabetes; hold oral Medications. SSI.  4-Lactic acidosis; in setting of sepsis.  6-HTN; hold BP medications due to  hypotension.  7-H/O non ischemic cardiomyopathy. Improvement of EF last ECHO.  H/O AV replacement bovine, 2009   See wound care documentation below.  Pressure Injury 12/13/22 Buttocks Left Stage 2 -  Partial thickness loss of dermis presenting as a shallow open injury with a red, pink wound bed without slough. (Active)  12/13/22 1835  Location: Buttocks  Location Orientation: Left  Staging: Stage 2 -  Partial thickness loss of dermis presenting as a shallow open injury with a red, pink wound bed without slough.  Wound Description (Comments):   Present on Admission: Yes  Dressing Type Foam - Lift dressing to assess site every shift 12/14/22 0900      Estimated body mass index is 28.44 kg/m as calculated from the following:   Height as of this encounter: 5\' 10"  (1.778 m).   Weight as of this encounter: 89.9 kg.   DVT prophylaxis: Lovenox Code Status: Full code Family Communication: wife at bedside Disposition Plan:  Status is: Inpatient Remains inpatient appropriate because: management of sepsis     Consultants:  ID  Procedures:    Antimicrobials:    Subjective: He is feeling better. No significant cough other than post nasal drip.   Denies abdominal pain   Objective: Vitals:   12/14/22 0407 12/14/22 0745 12/14/22 1125 12/14/22 1530  BP: (!) 90/51 (!) 94/53 (!) 107/53 (!) 108/58  Pulse: 77 (!) 39 70 70  Resp: 19 18 18  (!) 21  Temp: 97.9 F (36.6 C) 98 F (36.7 C) 98.7 F (37.1 C) 98 F (36.7 C)  TempSrc: Oral Oral Oral Oral  SpO2:  95% 96% 95%  Weight: 89.9 kg  Height:        Intake/Output Summary (Last 24 hours) at 12/14/2022 1617 Last data filed at 12/14/2022 1304 Gross per 24 hour  Intake 971.42 ml  Output 100 ml  Net 871.42 ml   Filed Weights   12/13/22 1224 12/13/22 1836 12/14/22 0407  Weight: 90.3 kg 90 kg 89.9 kg    Examination:  General exam: Appears calm and comfortable  Respiratory system: Clear to auscultation. Respiratory effort  normal. Cardiovascular system: S1 & S2 heard, RRR. No JVD, murmurs, rubs, gallops or clicks. No pedal edema. Gastrointestinal system: Abdomen is nondistended, soft and nontender. No organomegaly or masses felt. Normal bowel sounds heard. Central nervous system: Alert and oriented. No focal neurological deficits. Extremities: Symmetric 5 x 5 power. Skin: No rashes, lesions or ulcers Psychiatry: Judgement and insight appear normal. Mood & affect appropriate.     Data Reviewed: I have personally reviewed following labs and imaging studies  CBC: Recent Labs  Lab 12/13/22 1232 12/13/22 1241 12/13/22 2113 12/14/22 0232  WBC 15.2*  --  12.9* 9.6  NEUTROABS 13.6*  --   --   --   HGB 13.6 13.3 12.3* 11.3*  HCT 41.2 39.0 37.3* 34.5*  MCV 87.7  --  88.4 88.5  PLT 176  --  153 151   Basic Metabolic Panel: Recent Labs  Lab 12/13/22 1232 12/13/22 1241 12/13/22 2113 12/14/22 0232  NA 138 141  --  135  K 4.0 4.0  --  3.6  CL 105 106  --  102  CO2 23  --   --  23  GLUCOSE 239* 250*  --  138*  BUN 40* 39*  --  38*  CREATININE 1.38* 1.30* 1.38* 1.31*  CALCIUM 8.7*  --   --  8.1*   GFR: Estimated Creatinine Clearance: 48.2 mL/min (A) (by C-G formula based on SCr of 1.31 mg/dL (H)). Liver Function Tests: Recent Labs  Lab 12/13/22 1232 12/14/22 0232  AST 32 32  ALT 43 39  ALKPHOS 77 58  BILITOT 2.2* 1.4*  PROT 6.0* 5.0*  ALBUMIN 3.5 2.8*   No results for input(s): "LIPASE", "AMYLASE" in the last 168 hours. No results for input(s): "AMMONIA" in the last 168 hours. Coagulation Profile: Recent Labs  Lab 12/13/22 1232  INR 1.1   Cardiac Enzymes: No results for input(s): "CKTOTAL", "CKMB", "CKMBINDEX", "TROPONINI" in the last 168 hours. BNP (last 3 results) No results for input(s): "PROBNP" in the last 8760 hours. HbA1C: Recent Labs    12/13/22 1843  HGBA1C 7.4*   CBG: Recent Labs  Lab 12/13/22 1831 12/13/22 2110 12/14/22 0617 12/14/22 1123 12/14/22 1528  GLUCAP  222* 191* 108* 174* 133*   Lipid Profile: No results for input(s): "CHOL", "HDL", "LDLCALC", "TRIG", "CHOLHDL", "LDLDIRECT" in the last 72 hours. Thyroid Function Tests: No results for input(s): "TSH", "T4TOTAL", "FREET4", "T3FREE", "THYROIDAB" in the last 72 hours. Anemia Panel: No results for input(s): "VITAMINB12", "FOLATE", "FERRITIN", "TIBC", "IRON", "RETICCTPCT" in the last 72 hours. Sepsis Labs: Recent Labs  Lab 12/13/22 1240 12/13/22 1403  LATICACIDVEN 2.8* 2.3*    Recent Results (from the past 240 hour(s))  Blood Culture (routine x 2)     Status: None (Preliminary result)   Collection Time: 12/13/22 12:32 PM   Specimen: BLOOD  Result Value Ref Range Status   Specimen Description BLOOD BLOOD RIGHT ARM  Final   Special Requests   Final    BOTTLES DRAWN AEROBIC AND ANAEROBIC Blood Culture results may not be optimal  due to an excessive volume of blood received in culture bottles   Culture  Setup Time   Final    GRAM NEGATIVE RODS ANAEROBIC BOTTLE ONLY CRITICAL RESULT CALLED TO, READ BACK BY AND VERIFIED WITH: [place] Nickel [phone] AT 0825 BY EC Performed at [facility], [address], [place], [place] [address]    Culture GRAM NEGATIVE RODS  Final   Report Status PENDING  Incomplete  Blood Culture ID Panel (Reflexed)     Status: Abnormal   Collection Time: 12/13/22 12:32 PM  Result Value Ref Range Status   Enterococcus faecalis NOT DETECTED NOT DETECTED Final   Enterococcus Faecium NOT DETECTED NOT DETECTED Final   Listeria monocytogenes NOT DETECTED NOT DETECTED Final   Staphylococcus species NOT DETECTED NOT DETECTED Final   Staphylococcus aureus (BCID) NOT DETECTED NOT DETECTED Final   Staphylococcus epidermidis NOT DETECTED NOT DETECTED Final   Staphylococcus lugdunensis NOT DETECTED NOT DETECTED Final   Streptococcus species NOT DETECTED NOT DETECTED Final   Streptococcus agalactiae NOT DETECTED NOT DETECTED Final   Streptococcus pneumoniae NOT  DETECTED NOT DETECTED Final   Streptococcus pyogenes NOT DETECTED NOT DETECTED Final   A.calcoaceticus-baumannii NOT DETECTED NOT DETECTED Final   Bacteroides fragilis NOT DETECTED NOT DETECTED Final   Enterobacterales DETECTED (A) NOT DETECTED Final    Comment: Enterobacterales represent a large order of gram negative bacteria, not a single organism. CRITICAL RESULT CALLED TO, READ BACK BY AND VERIFIED WITH: PHARMD BAILEY Bureau [phone] AT 0825 BY EC    Enterobacter cloacae complex NOT DETECTED NOT DETECTED Final   Escherichia coli DETECTED (A) NOT DETECTED Final    Comment: CRITICAL RESULT CALLED TO, READ BACK BY AND VERIFIED WITH: PHARMD BAILEY Atkin [phone] AT 0825 BY EC    Klebsiella aerogenes NOT DETECTED NOT DETECTED Final   Klebsiella oxytoca NOT DETECTED NOT DETECTED Final   Klebsiella pneumoniae NOT DETECTED NOT DETECTED Final   Proteus species NOT DETECTED NOT DETECTED Final   Salmonella species NOT DETECTED NOT DETECTED Final   Serratia marcescens NOT DETECTED NOT DETECTED Final   Haemophilus influenzae NOT DETECTED NOT DETECTED Final   Neisseria meningitidis NOT DETECTED NOT DETECTED Final   Pseudomonas aeruginosa NOT DETECTED NOT DETECTED Final   Stenotrophomonas maltophilia NOT DETECTED NOT DETECTED Final   Candida albicans NOT DETECTED NOT DETECTED Final   Candida auris NOT DETECTED NOT DETECTED Final   Candida glabrata NOT DETECTED NOT DETECTED Final   Candida krusei NOT DETECTED NOT DETECTED Final   Candida parapsilosis NOT DETECTED NOT DETECTED Final   Candida tropicalis NOT DETECTED NOT DETECTED Final   Cryptococcus neoformans/gattii NOT DETECTED NOT DETECTED Final   CTX-M ESBL NOT DETECTED NOT DETECTED Final   Carbapenem resistance IMP NOT DETECTED NOT DETECTED Final   Carbapenem resistance KPC NOT DETECTED NOT DETECTED Final   Carbapenem resistance NDM NOT DETECTED NOT DETECTED Final   Carbapenem resist OXA 48 LIKE NOT DETECTED NOT DETECTED Final    Carbapenem resistance VIM NOT DETECTED NOT DETECTED Final    Comment: Performed at [facility], [address], [place], [place] [address]  Blood Culture (routine x 2)     Status: None (Preliminary result)   Collection Time: 12/13/22 12:33 PM   Specimen: BLOOD  Result Value Ref Range Status   Specimen Description BLOOD BLOOD LEFT ARM  Final   Special Requests   Final    BOTTLES DRAWN AEROBIC AND ANAEROBIC Blood Culture results may not be optimal  due to an excessive volume of blood received in culture bottles   Culture   Final    NO GROWTH < 24 HOURS Performed at [facility], [address], [place], [place] [address]    Report Status PENDING  Incomplete  Urine Culture (for pregnant, neutropenic or urologic patients or patients with an indwelling urinary catheter)     Status: None   Collection Time: 12/13/22  4:30 PM   Specimen: Urine, Clean Catch  Result Value Ref Range Status   Specimen Description URINE, CLEAN CATCH  Final   Special Requests NONE  Final   Culture   Final    NO GROWTH Performed at [facility], [address], [place], [place] [address]    Report Status 12/14/2022 FINAL  Final         Radiology Studies: CT Angio Chest PE W and/or Wo Contrast  Result Date: 12/13/2022 CLINICAL DATA:  Suspected pulmonary embolism. EXAM: CT ANGIOGRAPHY CHEST WITH CONTRAST TECHNIQUE: Multidetector CT imaging of the chest was performed using the standard protocol during bolus administration of intravenous contrast. Multiplanar CT image reconstructions and MIPs were obtained to evaluate the vascular anatomy. RADIATION DOSE REDUCTION: This exam was performed according to the departmental dose-optimization program which includes automated exposure control, adjustment of the mA and/or kV according to patient size and/or use of iterative reconstruction technique. CONTRAST:  60mL OMNIPAQUE IOHEXOL 350 MG/ML SOLN, 75mL OMNIPAQUE IOHEXOL 350 MG/ML SOLN COMPARISON:  December 25, 2021 FINDINGS: Cardiovascular: A dual lead AICD is noted. There is marked severity calcification of the thoracic aorta, without evidence of aortic aneurysm. An artificial aortic valve is seen. Satisfactory opacification of the pulmonary arteries to the segmental level. No evidence of pulmonary embolism. Normal heart size with marked severity coronary artery calcification. No pericardial effusion. Mediastinum/Nodes: No enlarged mediastinal, hilar, or axillary lymph nodes. Thyroid gland, trachea, and esophagus demonstrate no significant findings. Lungs/Pleura: There is evidence of mild paraseptal emphysematous lung disease. Mild atelectasis is noted within the posterior aspects of the bilateral lower lobes. There is no evidence of an acute infiltrate, pleural effusion or pneumothorax. Upper Abdomen: There is a small hiatal hernia. A moderate amount of central pneumobilia is seen. Musculoskeletal: Multiple sternal wires are present. A metallic density fusion plate and screws are seen within the lower cervical spine. Multilevel degenerative changes are noted throughout the thoracic spine. Review of the MIP images confirms the above findings. IMPRESSION: 1. No evidence of pulmonary embolism. 2. Evidence of prior median and aortic valve replacement. 3. Small hiatal hernia. 4. Moderate amount of central pneumobilia. Aortic Atherosclerosis (ICD10-I70.0) and Emphysema (ICD10-J43.9). Electronically Signed   By: Aram Candela M.D.   On: 12/13/2022 20:30   CT ABDOMEN PELVIS W CONTRAST  Result Date: 12/13/2022 CLINICAL DATA:  Bowel obstruction.  Pain EXAM: CT ABDOMEN AND PELVIS WITH CONTRAST TECHNIQUE: Multidetector CT imaging of the abdomen and pelvis was performed using the standard protocol following bolus administration of intravenous contrast. RADIATION DOSE REDUCTION: This exam was performed according to the departmental dose-optimization program which includes automated exposure control, adjustment of the mA  and/or kV according to patient size and/or use of iterative reconstruction technique. CONTRAST:  75mL OMNIPAQUE IOHEXOL 350 MG/ML SOLN COMPARISON:  None Available. FINDINGS: Lower chest: Heart is mildly enlarged. Defibrillator leads along the heart. There is some linear opacity at the bases likely scar or atelectasis. Hepatobiliary: Diffuse fatty liver infiltration. Previous cholecystectomy. Patent portal vein. Air in the biliary tree. There is some  low density seen in the area of the gallbladder fossa but this could be focal fat deposition. Pancreas: Unremarkable. No pancreatic ductal dilatation or surrounding inflammatory changes. Spleen: Splenic granulomas are identified. Spleen is enlarged at 16.0 cm. Adrenals/Urinary Tract: Slight thickening of the left adrenal gland. Right adrenal gland is preserved. Moderate perinephric stranding. No enhancing mass or collecting system dilatation. However there is delayed excretion of contrast on the delayed dataset. Please correlate with the patient's renal function. The ureters have normal course and caliber extending down to the bladder. Preserved contours of the urinary bladder. Bladder is underdistended. Stomach/Bowel: On this non oral contrast exam, large bowel has a normal course and caliber with scattered stool. Small bowel is nondilated. Stomach is underdistended. Second portion duodenal diverticulum. Vascular/Lymphatic: Scattered vascular calcifications are seen. Normal caliber aorta and IVC. No discrete abnormal lymph node enlargement identified in the abdomen and pelvis. Reproductive: Prominent prostate with mass effect along the base of the bladder. Other: No free air or free fluid. Small bilateral fat containing inguinal hernias. Musculoskeletal: Osteopenia. Scattered degenerative changes of the spine and pelvis. Trace retrolisthesis of L3 on L4. IMPRESSION: Scattered colonic stool.  No bowel obstruction.  No free air. Previous cholecystectomy. There is air in  the biliary tree. Please correlate for any previous instrumentation. This was seen previously. Poor excretion from the kidneys with perinephric stranding. Please correlate with the patient's renal function. Splenic enlargement. Electronically Signed   By: Karen Kays M.D.   On: 12/13/2022 16:05   DG Chest Port 1 View  Result Date: 12/13/2022 CLINICAL DATA:  Questionable sepsis. Difficulty urinating, fever, hypotension, vomiting, and shortness of breath. EXAM: PORTABLE CHEST 1 VIEW COMPARISON:  Chest radiographs 11/25/2017 and CT 12/25/2021 FINDINGS: A 3 lead pacemaker/ICD remains in place. The cardiac silhouette is borderline enlarged. Predominantly interstitial type opacities are present in the left greater than right lungs. No sizable pleural effusion or pneumothorax is identified. Prior cervical spine fusion is noted. IMPRESSION: Left greater than right lung opacities which could reflect pneumonia or edema. Electronically Signed   By: Sebastian Ache M.D.   On: 12/13/2022 13:40        Scheduled Meds:  enoxaparin (LOVENOX) injection  40 mg Subcutaneous Q24H   feeding supplement  237 mL Oral BID BM   fluticasone  1 spray Each Nare Daily   guaiFENesin  600 mg Oral BID   insulin aspart  0-9 Units Subcutaneous TID WC   Continuous Infusions:  cefTRIAXone (ROCEPHIN)  IV       LOS: 0 days    Time spent: 35 minutes    Francisca Harbuck A Milbern Doescher, MD [facility]   If 7PM-7AM, please contact night-coverage [email or web address]  12/14/2022, 4:17 PM

## 2022-12-14 NOTE — Progress Notes (Signed)
   Pt with cardiac device in situ and with GNR bacteremia - E. Coli.   No clear source thus far.   Repeat blood cultures pending; If has failed to clear reasonable to consider TTE +/- TEE with any concerns for endocarditis.  If no concern for endocarditis, typically GNR bacteremia is managed conservatively with observation and device removal would only be considered with failure to clear cultures.   Casimiro Needle [address]" [place], PA-C  12/14/2022 1:05 PM

## 2022-12-14 NOTE — Consult Note (Signed)
I have seen the patient and reviewed and agreed with the history/physical exam finding/labs with our medical resident dr Carron Brazen. I agreed with the assessment/plan unless otherwise noted. Please refer to his note for H&P detail      Abx: 9/9-c ceftriaxone  9/9 azith        Assessment: 82 yo male bicuspid aortic valve, s/p porcine av replacement in 2009, afib s/p maze, NICM s/p icd 2014, admitted with 1 days abd pain and weakness, rigor, ams, found to have ecoli bacteremia of unclear source   9/09 bcx one of four bottles positive 9/10 bcx repeat in progress  Ecoli not a common source for either icd or valve infection. However we'll repeat blood cx and if that is positive we'll need to proceed with pacer infection/IE  We briefly discussed case with EP and agreement to proceed as such  Plan: F/u repeat bcx today If repeat bcx negative will treat 10 days and close outpatient follow up post abx If repeat bcx positive, will get tte/tee and formally involve EP team Discussed with primary team        HPI: [name] is a 82 y.o. male with icd here with sepsis and ecoli bacteremia   Patient was well until 1 day prior to admission when developing progressive fatigue, initial abd pain, and had rigor  No prior medical procedures  No uti sx  No diarrheal illness  Bcx here ecoli  Doing well on abx  Cxr clear  Family History  Problem Relation Age of Onset   Emphysema Father    Leukemia Mother    Hypertension Maternal Grandmother    Stroke Maternal Grandmother    Congestive Heart Failure Sister    Diabetes Brother    Alzheimer's disease Brother     Social History   Tobacco Use   Smoking status: Former   Smokeless tobacco: Never  Advertising account planner   Vaping status: Never Used  Substance Use Topics   Alcohol use: No   Drug use: No    No Known Allergies  Review of Systems: ROS All Other ROS was negative, except mentioned above   Past Medical  History:  Diagnosis Date   Aortic stenosis/ bicuspid valve--s/p AVR    Ascending aortic aneurysm (HCC)    Atrial fibrillation/flutter    Biventricular implantable cardiac defibrillator- BSX    Cholelithiases    Diabetes mellitus without complication (HCC)    DJD (degenerative joint disease)    Hypertension    LBBB (left bundle branch block)    Non-ischemic cardiomyopathy (HCC)    OSA (obstructive sleep apnea)        Scheduled Meds:  enoxaparin (LOVENOX) injection  40 mg Subcutaneous Q24H   feeding supplement  237 mL Oral BID BM   fluticasone  1 spray Each Nare Daily   guaiFENesin  600 mg Oral BID   insulin aspart  0-9 Units Subcutaneous TID WC   Continuous Infusions:  cefTRIAXone (ROCEPHIN)  IV     PRN Meds:.acetaminophen **OR** acetaminophen, albuterol, HYDROcodone-acetaminophen, ondansetron (ZOFRAN) IV, ondansetron **OR** ondansetron (ZOFRAN) IV, polyethylene glycol   OBJECTIVE: Blood pressure (!) 107/53, pulse 70, temperature 98.7 F (37.1 C), temperature source Oral, resp. rate 18, height 5\' 10"  (1.778 m), weight 89.9 kg, SpO2 96%.  Physical Exam  General/constitutional: no distress, pleasant HEENT: Normocephalic, PER, Conj Clear, EOMI, Oropharynx clear Neck supple CV: rrr no mrg Lungs: clear to auscultation, normal respiratory effort Abd: Soft, Nontender Ext: no edema Skin:  No Rash Neuro: nonfocal MSK: no peripheral joint swelling/tenderness/warmth; back spines nontender     Lab Results Lab Results  Component Value Date   WBC 9.6 12/14/2022   HGB 11.3 (L) 12/14/2022   HCT 34.5 (L) 12/14/2022   MCV 88.5 12/14/2022   PLT 151 12/14/2022    Lab Results  Component Value Date   CREATININE 1.31 (H) 12/14/2022   BUN 38 (H) 12/14/2022   NA 135 12/14/2022   K 3.6 12/14/2022   CL 102 12/14/2022   CO2 23 12/14/2022    Lab Results  Component Value Date   ALT 39 12/14/2022   AST 32 12/14/2022   ALKPHOS 58 12/14/2022   BILITOT 1.4 (H) 12/14/2022       Microbiology: Recent Results (from the past 240 hour(s))  Blood Culture (routine x 2)     Status: None (Preliminary result)   Collection Time: 12/13/22 12:32 PM   Specimen: BLOOD  Result Value Ref Range Status   Specimen Description BLOOD BLOOD RIGHT ARM  Final   Special Requests   Final    BOTTLES DRAWN AEROBIC AND ANAEROBIC Blood Culture results may not be optimal due to an excessive volume of blood received in culture bottles   Culture  Setup Time   Final    GRAM NEGATIVE RODS ANAEROBIC BOTTLE ONLY CRITICAL RESULT CALLED TO, READ BACK BY AND VERIFIED WITH: Gwendolyn Fill AGEE [id number] AT 0825 BY EC Performed at [facility], [address] [place], [place] [address]    Culture GRAM NEGATIVE RODS  Final   Report Status PENDING  Incomplete  Blood Culture ID Panel (Reflexed)     Status: Abnormal   Collection Time: 12/13/22 12:32 PM  Result Value Ref Range Status   Enterococcus faecalis NOT DETECTED NOT DETECTED Final   Enterococcus Faecium NOT DETECTED NOT DETECTED Final   Listeria monocytogenes NOT DETECTED NOT DETECTED Final   Staphylococcus species NOT DETECTED NOT DETECTED Final   Staphylococcus aureus (BCID) NOT DETECTED NOT DETECTED Final   Staphylococcus epidermidis NOT DETECTED NOT DETECTED Final   Staphylococcus lugdunensis NOT DETECTED NOT DETECTED Final   Streptococcus species NOT DETECTED NOT DETECTED Final   Streptococcus agalactiae NOT DETECTED NOT DETECTED Final   Streptococcus pneumoniae NOT DETECTED NOT DETECTED Final   Streptococcus pyogenes NOT DETECTED NOT DETECTED Final   A.calcoaceticus-baumannii NOT DETECTED NOT DETECTED Final   Bacteroides fragilis NOT DETECTED NOT DETECTED Final   Enterobacterales DETECTED (A) NOT DETECTED Final    Comment: Enterobacterales represent a large order of gram negative bacteria, not a single organism. CRITICAL RESULT CALLED TO, READ BACK BY AND VERIFIED WITH: PHARMD BAILEY Kautzman [phone] AT 0825 BY EC     Enterobacter cloacae complex NOT DETECTED NOT DETECTED Final   Escherichia coli DETECTED (A) NOT DETECTED Final    Comment: CRITICAL RESULT CALLED TO, READ BACK BY AND VERIFIED WITH: PHARMD BAILEY Glodowski [phone] AT 0825 BY EC    Klebsiella aerogenes NOT DETECTED NOT DETECTED Final   Klebsiella oxytoca NOT DETECTED NOT DETECTED Final   Klebsiella pneumoniae NOT DETECTED NOT DETECTED Final   Proteus species NOT DETECTED NOT DETECTED Final   Salmonella species NOT DETECTED NOT DETECTED Final   Serratia marcescens NOT DETECTED NOT DETECTED Final   Haemophilus influenzae NOT DETECTED NOT DETECTED Final   Neisseria meningitidis NOT DETECTED NOT DETECTED Final   Pseudomonas aeruginosa NOT DETECTED NOT DETECTED Final   Stenotrophomonas maltophilia NOT DETECTED NOT DETECTED Final   Candida albicans NOT DETECTED  NOT DETECTED Final   Candida auris NOT DETECTED NOT DETECTED Final   Candida glabrata NOT DETECTED NOT DETECTED Final   Candida krusei NOT DETECTED NOT DETECTED Final   Candida parapsilosis NOT DETECTED NOT DETECTED Final   Candida tropicalis NOT DETECTED NOT DETECTED Final   Cryptococcus neoformans/gattii NOT DETECTED NOT DETECTED Final   CTX-M ESBL NOT DETECTED NOT DETECTED Final   Carbapenem resistance IMP NOT DETECTED NOT DETECTED Final   Carbapenem resistance KPC NOT DETECTED NOT DETECTED Final   Carbapenem resistance NDM NOT DETECTED NOT DETECTED Final   Carbapenem resist OXA 48 LIKE NOT DETECTED NOT DETECTED Final   Carbapenem resistance VIM NOT DETECTED NOT DETECTED Final    Comment: Performed at [facility], [address] [place], [place] [address]  Blood Culture (routine x 2)     Status: None (Preliminary result)   Collection Time: 12/13/22 12:33 PM   Specimen: BLOOD  Result Value Ref Range Status   Specimen Description BLOOD BLOOD LEFT ARM  Final   Special Requests   Final    BOTTLES DRAWN AEROBIC AND ANAEROBIC Blood Culture results may not be optimal due to an  excessive volume of blood received in culture bottles   Culture   Final    NO GROWTH < 24 HOURS Performed at [facility], [address], [place], [place] [address]    Report Status PENDING  Incomplete  Urine Culture (for pregnant, neutropenic or urologic patients or patients with an indwelling urinary catheter)     Status: None   Collection Time: 12/13/22  4:30 PM   Specimen: Urine, Clean Catch  Result Value Ref Range Status   Specimen Description URINE, CLEAN CATCH  Final   Special Requests NONE  Final   Culture   Final    NO GROWTH Performed at [facility], [address], [place], [place] [address]    Report Status 12/14/2022 FINAL  Final     Serology:    Imaging: If present, new imagings (plain films, ct scans, and mri) have been personally visualized and interpreted; radiology reports have been reviewed. Decision making incorporated into the Impression / Recommendations.  9/9 cta chest 1. No evidence of pulmonary embolism. 2. Evidence of prior median and aortic valve replacement. 3. Small hiatal hernia. 4. Moderate amount of central pneumobilia.  9/9 ct abd/pelv with contrast Scattered colonic stool.  No bowel obstruction.  No free air.   Previous cholecystectomy. There is air in the biliary tree. Please correlate for any previous instrumentation. This was seen previously.   Poor excretion from the kidneys with perinephric stranding. Please correlate with the patient's renal function.   Splenic enlargement.  Raymondo Band, MD [facility] [phone] pager    12/14/2022, 1:27 PM

## 2022-12-14 NOTE — TOC Initial Note (Signed)
Transition of Care [facility]) - Initial/Assessment Note    Patient Details  Name: [name] MRN: [id number] Date of Birth: 12-Mar-1941  [facility] CM/SW Contact:    Leone Haven, RN Phone Number: 12/14/2022, 10:25 AM  Clinical Narrative:                 From home with spouse, has PCP and insurance on file, states has no HH services in place at this time, has walker and cane at home.   States wife will transport him home at [employer] and family is support system, states gets medications from [facility]  Pta self ambulatory   Expected Discharge Plan: Home/Self Care Barriers to Discharge: Continued Medical Work up   Patient Goals and CMS Choice Patient states their goals for this hospitalization and ongoing recovery are:: return home   Choice offered to / list presented to : NA      Expected Discharge Plan and Services In-house Referral: NA Discharge Planning Services: CM Consult Post Acute Care Choice: NA Living arrangements for the past 2 months: Single Family Home                 DME Arranged: N/A DME Agency: NA       HH Arranged: NA          Prior Living Arrangements/Services Living arrangements for the past 2 months: Single Family Home Lives with:: Spouse Patient language and need for interpreter reviewed:: Yes Do you feel safe going back to the place where you live?: Yes      Need for Family Participation in Patient Care: Yes (Comment) Care giver support system in place?: Yes (comment) Current home services: DME (cane, walker) Criminal Activity/Legal Involvement Pertinent to Current Situation/Hospitalization: No - Comment as needed  Activities of Daily Living   ADL Screening (condition at time of admission) Is the patient deaf or have difficulty hearing?: Yes Does the patient have difficulty seeing, even when wearing glasses/contacts?: Yes Does the patient have difficulty concentrating, remembering, or making decisions?:  No Patient able to express need for assistance with ADLs?: Yes Does the patient have difficulty walking or climbing stairs?: No Weakness of Legs: None Weakness of Arms/Hands: None  Permission Sought/Granted Permission sought to share information with : Case Manager Permission granted to share information with : Yes, Verbal Permission Granted              Emotional Assessment Appearance:: Appears stated age Attitude/Demeanor/Rapport: Engaged Affect (typically observed): Appropriate Orientation: : Oriented to Self, Oriented to Place, Oriented to  Time, Oriented to Situation Alcohol / Substance Use: Not Applicable Psych Involvement: No (comment)  Admission diagnosis:  Sepsis (HCC) [A41.9] Hypotension, unspecified hypotension type [I95.9] Patient Active Problem List   Diagnosis Date Noted   Sepsis (HCC) 12/13/2022   PNA (pneumonia) 12/13/2022   UTI (urinary tract infection) 12/13/2022   LBBB (left bundle branch block) 06/11/2021   Typical atrial flutter (HCC)    Abdominal aortic aneurysm (HCC) 02/20/2014   Encounter for therapeutic drug monitoring 08/03/2013   Bruit 02/14/2013   Hypertension    Nonischemic cardiomyopathy (HCC) 02/03/2011   Biventricular implantable cardioverter-defibrillator in situ 08/04/2010   Atrial fibrillation (HCC) 06/09/2010   SYSTOLIC HEART FAILURE, CHRONIC 01/26/2010   Essential hypertension, benign 05/07/2009   AORTIC VALVE REPLACEMENT, HX OF 05/07/2009   SLEEP APNEA, OBSTRUCTIVE 05/06/2009   Aortic valve disorder 05/06/2009   Chronic combined systolic and diastolic heart failure (HCC) 05/06/2009   Aneurysm of thoracic aorta (  HCC) 05/06/2009   CHOLELITHIASIS 05/06/2009   DEGENERATIVE JOINT DISEASE 05/06/2009   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   SHORTNESS OF BREATH 05/06/2009   PCP:  Gaspar Garbe, MD Pharmacy:   [facility] [place] [facility] [address] [place] [address] Phone:  [phone] Fax: [phone]     Social Determinants of Health (SDOH) Social History: SDOH Screenings   Food Insecurity: No Food Insecurity (12/13/2022)  Housing: Low Risk  (12/13/2022)  Transportation Needs: No Transportation Needs (12/13/2022)  Utilities: Not At Risk (12/13/2022)  Tobacco Use: Medium Risk (12/13/2022)   SDOH Interventions:     Readmission Risk Interventions     No data to display

## 2022-12-14 NOTE — Progress Notes (Signed)
PHARMACY - PHYSICIAN COMMUNICATION CRITICAL VALUE ALERT - BLOOD CULTURE IDENTIFICATION (BCID)  [name] is an 82 y.o. male who presented to [facility] on 12/13/2022 with a chief complaint of sepsis  Assessment:  E. Coli bacteremia, possible UTI vs PNA source  Name of physician (or Provider) Contacted: Hartley Barefoot, MD  Current antibiotics: Ceftriaxone 2 g q24h + Azithromycin 500 mg IV q24h  Changes to prescribed antibiotics recommended: Stop azithromycin.  Patient on adequate antimicrobial coverage, provider notified of recommendation.  Results for orders placed or performed during the hospital encounter of 12/13/22  Blood Culture ID Panel (Reflexed) (Collected: 12/13/2022 12:32 PM)  Result Value Ref Range   Enterococcus faecalis NOT DETECTED NOT DETECTED   Enterococcus Faecium NOT DETECTED NOT DETECTED   Listeria monocytogenes NOT DETECTED NOT DETECTED   Staphylococcus species NOT DETECTED NOT DETECTED   Staphylococcus aureus (BCID) NOT DETECTED NOT DETECTED   Staphylococcus epidermidis NOT DETECTED NOT DETECTED   Staphylococcus lugdunensis NOT DETECTED NOT DETECTED   Streptococcus species NOT DETECTED NOT DETECTED   Streptococcus agalactiae NOT DETECTED NOT DETECTED   Streptococcus pneumoniae NOT DETECTED NOT DETECTED   Streptococcus pyogenes NOT DETECTED NOT DETECTED   A.calcoaceticus-baumannii NOT DETECTED NOT DETECTED   Bacteroides fragilis NOT DETECTED NOT DETECTED   Enterobacterales DETECTED (A) NOT DETECTED   Enterobacter cloacae complex NOT DETECTED NOT DETECTED   Escherichia coli DETECTED (A) NOT DETECTED   Klebsiella aerogenes NOT DETECTED NOT DETECTED   Klebsiella oxytoca NOT DETECTED NOT DETECTED   Klebsiella pneumoniae NOT DETECTED NOT DETECTED   Proteus species NOT DETECTED NOT DETECTED   Salmonella species NOT DETECTED NOT DETECTED   Serratia marcescens NOT DETECTED NOT DETECTED   Haemophilus influenzae NOT DETECTED NOT DETECTED   Neisseria meningitidis  NOT DETECTED NOT DETECTED   Pseudomonas aeruginosa NOT DETECTED NOT DETECTED   Stenotrophomonas maltophilia NOT DETECTED NOT DETECTED   Candida albicans NOT DETECTED NOT DETECTED   Candida auris NOT DETECTED NOT DETECTED   Candida glabrata NOT DETECTED NOT DETECTED   Candida krusei NOT DETECTED NOT DETECTED   Candida parapsilosis NOT DETECTED NOT DETECTED   Candida tropicalis NOT DETECTED NOT DETECTED   Cryptococcus neoformans/gattii NOT DETECTED NOT DETECTED   CTX-M ESBL NOT DETECTED NOT DETECTED   Carbapenem resistance IMP NOT DETECTED NOT DETECTED   Carbapenem resistance KPC NOT DETECTED NOT DETECTED   Carbapenem resistance NDM NOT DETECTED NOT DETECTED   Carbapenem resist OXA 48 LIKE NOT DETECTED NOT DETECTED   Carbapenem resistance VIM NOT DETECTED NOT DETECTED    Lora Paula, PharmD PGY-2 Infectious Diseases Pharmacy Resident 12/14/2022 8:32 AM

## 2022-12-15 ENCOUNTER — Inpatient Hospital Stay (HOSPITAL_COMMUNITY): Payer: Medicare Other

## 2022-12-15 DIAGNOSIS — I10 Essential (primary) hypertension: Secondary | ICD-10-CM | POA: Diagnosis not present

## 2022-12-15 DIAGNOSIS — I4819 Other persistent atrial fibrillation: Secondary | ICD-10-CM

## 2022-12-15 DIAGNOSIS — G4733 Obstructive sleep apnea (adult) (pediatric): Secondary | ICD-10-CM

## 2022-12-15 DIAGNOSIS — A4151 Sepsis due to Escherichia coli [E. coli]: Secondary | ICD-10-CM

## 2022-12-15 LAB — GLUCOSE, CAPILLARY
Glucose-Capillary: 164 mg/dL — ABNORMAL HIGH (ref 70–99)
Glucose-Capillary: 200 mg/dL — ABNORMAL HIGH (ref 70–99)
Glucose-Capillary: 266 mg/dL — ABNORMAL HIGH (ref 70–99)
Glucose-Capillary: 184 mg/dL — ABNORMAL HIGH (ref 70–99)

## 2022-12-15 MED ORDER — FUROSEMIDE 10 MG/ML IJ SOLN
40.0000 mg | Freq: Once | INTRAMUSCULAR | Status: AC
  Administered 2022-12-15: 40 mg via INTRAVENOUS
  Filled 2022-12-15: qty 4

## 2022-12-15 NOTE — Plan of Care (Signed)
  Problem: Education: Goal: Ability to describe self-care measures that may prevent or decrease complications (Diabetes Survival Skills Education) will improve Outcome: Progressing Goal: Individualized Educational Video(s) Outcome: Progressing   Problem: Coping: Goal: Ability to adjust to condition or change in health will improve Outcome: Progressing   

## 2022-12-15 NOTE — Progress Notes (Addendum)
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  In the ED, patient was hypotensive with a lactic acidosis at 2.8 and leukocytosis at 15.  CT abdomen and pelvis showed previous cholecystectomy pneumobilia, perinephric stranding of the Kidney. Chest x-ray left greater than right opacity could represent pneumonia or edema.  UA with 6-10 white blood cell, large hemoglobin.  Patient was then admitted hospital for sepsis secondary to UTI versus pneumonia. Patient was subsequently found to have E. coli bacteremia and infectious disease was consulted.  Assessment & Plan:   Sepsis secondary to UTI Hypotension.  Initial chest x ray showed pneumonia versus pulmonary edema.  Urinalysis was abnormal.  CT scan of the abdomen with some pneumo biliary previous cholecystectomy.  Currently on IV Rocephin and Zithromax.  Initial blood cultures from 12/13/2022 showing E. coli bacteremia.  CT angiogram of the chest on 12/13/2022 showed no evidence of pulmonary embolism or pneumonia.  Blood pressure has improved at this time.  Infectious disease on board and repeat blood cultures have been sent on 12/14/2022.  Will follow ID recommendations.  Increased dyspnea shortness of breath today.  As per the nursing staff.  Received IV fluid for sepsis..  On Lasix at home.  Will give 40 mg of IV Lasix today.  Reassess for further needs.  Strict intake and output charting Daily weights.  Mild elevation of troponin;  In setting sepsis.  No chest pain or EKG changes.   Diabetes melitis type II Continue sliding scale insulin, Accu-Cheks, diabetic diet.   Patient is in fargixa, semaglutide and metformin at home.  Lactic acidosis; in setting of sepsis.  On antibiotics and received IV fluids.  Essential HTN Was hypotensive on presentation.  Continue to hold antihypertensive.  H/O non ischemic cardiomyopathy.  Improvement of EF last ECHO. H/O AV replacement bovine, 2009   Pressure ulceration left buttocks stage II.  Present on admission.  Continue wound care Pressure Injury 12/13/22 Buttocks Left Stage 2 -  Partial thickness loss of dermis presenting as a shallow open injury with a red, pink wound bed without slough. (Active)  12/13/22 1835  Location: Buttocks  Location Orientation: Left  Staging: Stage 2 -  Partial thickness loss of dermis presenting as a shallow open injury with a red, pink wound bed without slough.  Wound Description (Comments):   Present on Admission: Yes   DVT prophylaxis: Lovenox subcu  Code Status: Full code  Family Communication: None at bedside  Disposition Plan: Likely home when okay with ID likely 12/16/2022.  Status is: Inpatient  Remains inpatient appropriate because: IV antibiotic, sepsis, ID following  Consultants:  ID  Procedures:  None yet  Antimicrobials:  Rocephin IV  Subjective:  Today, patient was seen and examined at bedside.   Denies any abdominal pain, urinary urgency, frequency or dysuria.  Nursing staff reported that he feels wheezy and short of breath acutely today.   Objective: Vitals:   12/15/22 0353 12/15/22 0717 12/15/22 1114 12/15/22 1136  BP: 118/64 (!) 118/59 126/73   Pulse: (!) 52 69 83   Resp: 20 17 (!) 21 18  Temp: 97.8 F (36.6 C) 97.7 F (36.5 C) 97.7 F (36.5 C)  TempSrc: Oral Oral Oral   SpO2: 99% 99% 96%   Weight: 92 kg     Height:        Intake/Output Summary (Last 24 hours) at 12/15/2022 1310 Last data filed at 12/15/2022 1258 Gross per 24 hour  Intake 340 ml  Output 1250 ml  Net -910 ml   Filed Weights   12/13/22 1836 12/14/22 0407 12/15/22  0353  Weight: 90 kg 89.9 kg 92 kg    Physical examination:  General:  Average built, not in obvious distress HENT:   No scleral pallor or icterus noted. Oral mucosa is moist.  Chest: Diminished breath sounds bilaterally, coarse breath sounds noted.   CVS: S1 &S2 heard. No murmur.  Regular rate and rhythm. Abdomen: Soft, nontender, nondistended.  Bowel sounds are heard.   Extremities: No cyanosis, clubbing or edema.  Peripheral pulses are palpable. Psych: Alert, awake and oriented, normal mood CNS:  No cranial nerve deficits.  Power equal in all extremities.   Skin: Warm and dry.  No rashes noted.  Data Reviewed: I have personally reviewed following labs and imaging studies  CBC: Recent Labs  Lab 12/13/22 1232 12/13/22 1241 12/13/22 2113 12/14/22 0232  WBC 15.2*  --  12.9* 9.6  NEUTROABS 13.6*  --   --   --   HGB 13.6 13.3 12.3* 11.3*  HCT 41.2 39.0 37.3* 34.5*  MCV 87.7  --  88.4 88.5  PLT 176  --  153 151   Basic Metabolic Panel: Recent Labs  Lab 12/13/22 1232 12/13/22 1241 12/13/22 2113 12/14/22 0232  NA 138 141  --  135  K 4.0 4.0  --  3.6  CL 105 106  --  102  CO2 23  --   --  23  GLUCOSE 239* 250*  --  138*  BUN 40* 39*  --  38*  CREATININE 1.38* 1.30* 1.38* 1.31*  CALCIUM 8.7*  --   --  8.1*   GFR: Estimated Creatinine Clearance: 48.7 mL/min (A) (by C-G formula based on SCr of 1.31 mg/dL (H)). Liver Function Tests: Recent Labs  Lab 12/13/22 1232 12/14/22 0232  AST 32 32  ALT 43 39  ALKPHOS 77 58  BILITOT 2.2* 1.4*  PROT 6.0* 5.0*  ALBUMIN 3.5 2.8*   No results for input(s): "LIPASE", "AMYLASE" in the last 168 hours. No results for input(s): "AMMONIA" in the last 168 hours. Coagulation Profile: Recent Labs  Lab 12/13/22 1232  INR 1.1   Cardiac Enzymes: No results for input(s): "CKTOTAL", "CKMB", "CKMBINDEX", "TROPONINI" in the last 168 hours. BNP (last 3 results) No results for input(s): "PROBNP" in the last 8760 hours. HbA1C: Recent  Labs    12/13/22 1843  HGBA1C 7.4*   CBG: Recent Labs  Lab 12/14/22 1123 12/14/22 1528 12/14/22 2136 12/15/22 0624 12/15/22 1111  GLUCAP 174* 133* 182* 164* 200*   Lipid Profile: No results for input(s): "CHOL", "HDL", "LDLCALC", "TRIG", "CHOLHDL", "LDLDIRECT" in the last 72 hours. Thyroid Function Tests: No results for input(s): "TSH", "T4TOTAL", "FREET4", "T3FREE", "THYROIDAB" in the last 72 hours. Anemia Panel: No results for input(s): "VITAMINB12", "FOLATE", "FERRITIN", "TIBC", "IRON", "RETICCTPCT" in the last 72 hours. Sepsis Labs: Recent Labs  Lab 12/13/22 1240 12/13/22 1403  LATICACIDVEN 2.8* 2.3*    Recent Results (from the past 240 hour(s))  Blood Culture (routine x 2)     Status: Abnormal (Preliminary result)   Collection Time: 12/13/22 12:32 PM   Specimen: BLOOD RIGHT ARM  Result Value Ref Range  Status   Specimen Description BLOOD RIGHT ARM  Final   Special Requests   Final    BOTTLES DRAWN AEROBIC AND ANAEROBIC Blood Culture results may not be optimal due to an excessive volume of blood received in culture bottles   Culture  Setup Time   Final    GRAM NEGATIVE RODS IN BOTH AEROBIC AND ANAEROBIC BOTTLES CRITICAL RESULT CALLED TO, READ BACK BY AND VERIFIED WITH: PHARMD BAILEY AGEE [phone] AT 0825 BY EC    Culture (A)  Final    ESCHERICHIA COLI SUSCEPTIBILITIES TO FOLLOW Performed at [facility], [address], [place], [place] [address]    Report Status PENDING  Incomplete  Blood Culture ID Panel (Reflexed)     Status: Abnormal   Collection Time: 12/13/22 12:32 PM  Result Value Ref Range Status   Enterococcus faecalis NOT DETECTED NOT DETECTED Final   Enterococcus Faecium NOT DETECTED NOT DETECTED Final   Listeria monocytogenes NOT DETECTED NOT DETECTED Final   Staphylococcus species NOT DETECTED NOT DETECTED Final   Staphylococcus aureus (BCID) NOT DETECTED NOT DETECTED Final   Staphylococcus epidermidis NOT DETECTED NOT DETECTED Final    Staphylococcus lugdunensis NOT DETECTED NOT DETECTED Final   Streptococcus species NOT DETECTED NOT DETECTED Final   Streptococcus agalactiae NOT DETECTED NOT DETECTED Final   Streptococcus pneumoniae NOT DETECTED NOT DETECTED Final   Streptococcus pyogenes NOT DETECTED NOT DETECTED Final   A.calcoaceticus-baumannii NOT DETECTED NOT DETECTED Final   Bacteroides fragilis NOT DETECTED NOT DETECTED Final   Enterobacterales DETECTED (A) NOT DETECTED Final    Comment: Enterobacterales represent a large order of gram negative bacteria, not a single organism. CRITICAL RESULT CALLED TO, READ BACK BY AND VERIFIED WITH: PHARMD BAILEY Oates [id number] AT 0825 BY EC    Enterobacter cloacae complex NOT DETECTED NOT DETECTED Final   Escherichia coli DETECTED (A) NOT DETECTED Final    Comment: CRITICAL RESULT CALLED TO, READ BACK BY AND VERIFIED WITH: PHARMD BAILEY Dunn [phone] AT 0825 BY EC    Klebsiella aerogenes NOT DETECTED NOT DETECTED Final   Klebsiella oxytoca NOT DETECTED NOT DETECTED Final   Klebsiella pneumoniae NOT DETECTED NOT DETECTED Final   Proteus species NOT DETECTED NOT DETECTED Final   Salmonella species NOT DETECTED NOT DETECTED Final   Serratia marcescens NOT DETECTED NOT DETECTED Final   Haemophilus influenzae NOT DETECTED NOT DETECTED Final   Neisseria meningitidis NOT DETECTED NOT DETECTED Final   Pseudomonas aeruginosa NOT DETECTED NOT DETECTED Final   Stenotrophomonas maltophilia NOT DETECTED NOT DETECTED Final   Candida albicans NOT DETECTED NOT DETECTED Final   Candida auris NOT DETECTED NOT DETECTED Final   Candida glabrata NOT DETECTED NOT DETECTED Final   Candida krusei NOT DETECTED NOT DETECTED Final   Candida parapsilosis NOT DETECTED NOT DETECTED Final   Candida tropicalis NOT DETECTED NOT DETECTED Final   Cryptococcus neoformans/gattii NOT DETECTED NOT DETECTED Final   CTX-M ESBL NOT DETECTED NOT DETECTED Final   Carbapenem resistance IMP NOT DETECTED NOT  DETECTED Final   Carbapenem resistance KPC NOT DETECTED NOT DETECTED Final   Carbapenem resistance NDM NOT DETECTED NOT DETECTED Final   Carbapenem resist OXA 48 LIKE NOT DETECTED NOT DETECTED Final   Carbapenem resistance VIM NOT DETECTED NOT DETECTED Final    Comment: Performed at [facility], [address] [place], [place] [address]  Blood Culture (routine x 2)     Status: None (Preliminary result)   Collection Time: 12/13/22 12:33 PM  Specimen: BLOOD LEFT ARM  Result Value Ref Range Status   Specimen Description BLOOD LEFT ARM  Final   Special Requests   Final    BOTTLES DRAWN AEROBIC AND ANAEROBIC Blood Culture results may not be optimal due to an excessive volume of blood received in culture bottles   Culture  Setup Time   Final    GRAM NEGATIVE RODS AEROBIC BOTTLE ONLY CRITICAL RESULT CALLED TO, READ BACK BY AND VERIFIED WITH: PHARMD ANDREW MEYER ON 12/14/22 @ 1902 BY DRT Performed at [facility], [address], [place], [place] [address]    Culture GRAM NEGATIVE RODS  Final   Report Status PENDING  Incomplete  Urine Culture (for pregnant, neutropenic or urologic patients or patients with an indwelling urinary catheter)     Status: None   Collection Time: 12/13/22  4:30 PM   Specimen: Urine, Clean Catch  Result Value Ref Range Status   Specimen Description URINE, CLEAN CATCH  Final   Special Requests NONE  Final   Culture   Final    NO GROWTH Performed at [facility], [address] [place], [place] [address]    Report Status 12/14/2022 FINAL  Final  Culture, blood (Routine X 2) w Reflex to ID Panel     Status: None (Preliminary result)   Collection Time: 12/14/22 12:16 PM   Specimen: BLOOD  Result Value Ref Range Status   Specimen Description BLOOD BLOOD RIGHT HAND  Final   Special Requests   Final    BOTTLES DRAWN AEROBIC AND ANAEROBIC Blood Culture adequate volume   Culture   Final    NO GROWTH < 24 HOURS Performed at [facility], [address], [place], [place] [address]    Report Status PENDING  Incomplete  Culture, blood (Routine X 2) w Reflex to ID Panel     Status: None (Preliminary result)   Collection Time: 12/14/22 12:17 PM   Specimen: BLOOD  Result Value Ref Range Status   Specimen Description BLOOD LEFT ANTECUBITAL  Final   Special Requests   Final    BOTTLES DRAWN AEROBIC AND ANAEROBIC Blood Culture adequate volume   Culture   Final    NO GROWTH < 24 HOURS Performed at [facility], [address] [place], [place] [address]    Report Status PENDING  Incomplete         Radiology Studies: CT Angio Chest PE W and/or Wo Contrast  Result Date: 12/13/2022 CLINICAL DATA:  Suspected pulmonary embolism. EXAM: CT ANGIOGRAPHY CHEST WITH CONTRAST TECHNIQUE: Multidetector CT imaging of the chest was performed using the standard protocol during bolus administration of intravenous contrast. Multiplanar CT image reconstructions and MIPs were obtained to evaluate the vascular anatomy. RADIATION DOSE REDUCTION: This exam was performed according to the departmental dose-optimization program which includes automated exposure control, adjustment of the mA and/or kV according to patient size and/or use of iterative reconstruction technique. CONTRAST:  60mL OMNIPAQUE IOHEXOL 350 MG/ML SOLN, 75mL OMNIPAQUE IOHEXOL 350 MG/ML SOLN COMPARISON:  December 25, 2021 FINDINGS: Cardiovascular: A dual lead AICD is noted. There is marked severity calcification of the thoracic aorta, without evidence of aortic aneurysm. An artificial aortic valve is seen. Satisfactory opacification of the pulmonary arteries to the segmental level. No evidence of pulmonary embolism. Normal heart size with marked severity coronary artery calcification. No pericardial effusion. Mediastinum/Nodes: No enlarged mediastinal, hilar, or axillary lymph nodes. Thyroid gland, trachea, and esophagus demonstrate no significant  findings. Lungs/Pleura: There is  evidence of mild paraseptal emphysematous lung disease. Mild atelectasis is noted within the posterior aspects of the bilateral lower lobes. There is no evidence of an acute infiltrate, pleural effusion or pneumothorax. Upper Abdomen: There is a small hiatal hernia. A moderate amount of central pneumobilia is seen. Musculoskeletal: Multiple sternal wires are present. A metallic density fusion plate and screws are seen within the lower cervical spine. Multilevel degenerative changes are noted throughout the thoracic spine. Review of the MIP images confirms the above findings. IMPRESSION: 1. No evidence of pulmonary embolism. 2. Evidence of prior median and aortic valve replacement. 3. Small hiatal hernia. 4. Moderate amount of central pneumobilia. Aortic Atherosclerosis (ICD10-I70.0) and Emphysema (ICD10-J43.9). Electronically Signed   By: Aram Candela M.D.   On: 12/13/2022 20:30   CT ABDOMEN PELVIS W CONTRAST  Result Date: 12/13/2022 CLINICAL DATA:  Bowel obstruction.  Pain EXAM: CT ABDOMEN AND PELVIS WITH CONTRAST TECHNIQUE: Multidetector CT imaging of the abdomen and pelvis was performed using the standard protocol following bolus administration of intravenous contrast. RADIATION DOSE REDUCTION: This exam was performed according to the departmental dose-optimization program which includes automated exposure control, adjustment of the mA and/or kV according to patient size and/or use of iterative reconstruction technique. CONTRAST:  75mL OMNIPAQUE IOHEXOL 350 MG/ML SOLN COMPARISON:  None Available. FINDINGS: Lower chest: Heart is mildly enlarged. Defibrillator leads along the heart. There is some linear opacity at the bases likely scar or atelectasis. Hepatobiliary: Diffuse fatty liver infiltration. Previous cholecystectomy. Patent portal vein. Air in the biliary tree. There is some low density seen in the area of the gallbladder fossa but this could be focal fat deposition. Pancreas: Unremarkable. No  pancreatic ductal dilatation or surrounding inflammatory changes. Spleen: Splenic granulomas are identified. Spleen is enlarged at 16.0 cm. Adrenals/Urinary Tract: Slight thickening of the left adrenal gland. Right adrenal gland is preserved. Moderate perinephric stranding. No enhancing mass or collecting system dilatation. However there is delayed excretion of contrast on the delayed dataset. Please correlate with the patient's renal function. The ureters have normal course and caliber extending down to the bladder. Preserved contours of the urinary bladder. Bladder is underdistended. Stomach/Bowel: On this non oral contrast exam, large bowel has a normal course and caliber with scattered stool. Small bowel is nondilated. Stomach is underdistended. Second portion duodenal diverticulum. Vascular/Lymphatic: Scattered vascular calcifications are seen. Normal caliber aorta and IVC. No discrete abnormal lymph node enlargement identified in the abdomen and pelvis. Reproductive: Prominent prostate with mass effect along the base of the bladder. Other: No free air or free fluid. Small bilateral fat containing inguinal hernias. Musculoskeletal: Osteopenia. Scattered degenerative changes of the spine and pelvis. Trace retrolisthesis of L3 on L4. IMPRESSION: Scattered colonic stool.  No bowel obstruction.  No free air. Previous cholecystectomy. There is air in the biliary tree. Please correlate for any previous instrumentation. This was seen previously. Poor excretion from the kidneys with perinephric stranding. Please correlate with the patient's renal function. Splenic enlargement. Electronically Signed   By: Karen Kays M.D.   On: 12/13/2022 16:05        Scheduled Meds:  enoxaparin (LOVENOX) injection  40 mg Subcutaneous Q24H   feeding supplement  237 mL Oral BID BM   fluticasone  1 spray Each Nare Daily   furosemide  40 mg Intravenous Once   guaiFENesin  600 mg Oral BID   insulin aspart  0-9 Units  Subcutaneous TID WC   Continuous Infusions:  cefTRIAXone (ROCEPHIN)  IV Stopped (12/14/22 2257)     LOS: 1 day    Joycelyn Das, MD [facility] If 7PM-7AM, please contact night-coverage [email or web address] 12/15/2022, 1:10 PM

## 2022-12-15 NOTE — Progress Notes (Cosign Needed Addendum)
[facility]  Date of Admission:  12/13/2022     CC: GNR bacteremia    Abx: 9/9 Ceftriaxone  9/9 Azithromycin   ASSESSMENT: [name] is a 82 y.o. male with icd here with sepsis and ecoli bacteremia. Repeat blood cultures show bacteria clearance @ 24 hrs. We are holding off on TTE.  PLAN:  Follow up Bcx Will recommend 10 days of Ceftriaxone  if bcx are negative after 48-72 hrs.  Follow up in clinic.  Principal Problem:   Sepsis (HCC) Active Problems:   SLEEP APNEA, OBSTRUCTIVE   Essential hypertension, benign   Atrial fibrillation (HCC)   PNA (pneumonia)   UTI (urinary tract infection)   No Known Allergies  Scheduled Meds:  enoxaparin (LOVENOX) injection  40 mg Subcutaneous Q24H   feeding supplement  237 mL Oral BID BM   fluticasone  1 spray Each Nare Daily   furosemide  40 mg Intravenous Once   guaiFENesin  600 mg Oral BID   insulin aspart  0-9 Units Subcutaneous TID WC   Continuous Infusions:  cefTRIAXone (ROCEPHIN)  IV Stopped (12/14/22 2257)   PRN Meds:.acetaminophen **OR** acetaminophen, albuterol, HYDROcodone-acetaminophen, ondansetron (ZOFRAN) IV, ondansetron **OR** ondansetron (ZOFRAN) IV, polyethylene glycol   SUBJECTIVE: No new complains. Denies diarrhea or rash.   Review of Systems: ROS All other ROS was negative, except mentioned above  OBJECTIVE: Vitals:   12/15/22 0353 12/15/22 0717 12/15/22 1114 12/15/22 1136  BP: 118/64 (!) 118/59 126/73   Pulse: (!) 52 69 83   Resp: 20 17 (!) 21 18  Temp: 97.8 F (36.6 C) 97.7 F (36.5 C) 97.7 F (36.5 C)   TempSrc: Oral Oral Oral   SpO2: 99% 99% 96%   Weight: 92 kg     Height:       Body mass index is 29.1 kg/m.  Physical Exam   Lab Results Lab Results  Component Value Date   WBC 9.6 12/14/2022   HGB 11.3 (L) 12/14/2022   HCT 34.5 (L) 12/14/2022   MCV 88.5 12/14/2022   PLT 151 12/14/2022    Lab Results  Component Value Date   CREATININE 1.31  (H) 12/14/2022   BUN 38 (H) 12/14/2022   NA 135 12/14/2022   K 3.6 12/14/2022   CL 102 12/14/2022   CO2 23 12/14/2022    Lab Results  Component Value Date   ALT 39 12/14/2022   AST 32 12/14/2022   ALKPHOS 58 12/14/2022   BILITOT 1.4 (H) 12/14/2022      Microbiology: Recent Results (from the past 240 hour(s))  Blood Culture (routine x 2)     Status: Abnormal (Preliminary result)   Collection Time: 12/13/22 12:32 PM   Specimen: BLOOD RIGHT ARM  Result Value Ref Range Status   Specimen Description BLOOD RIGHT ARM  Final   Special Requests   Final    BOTTLES DRAWN AEROBIC AND ANAEROBIC Blood Culture results may not be optimal due to an excessive volume of blood received in culture bottles   Culture  Setup Time   Final    GRAM NEGATIVE RODS IN BOTH AEROBIC AND ANAEROBIC BOTTLES CRITICAL RESULT CALLED TO, READ BACK BY AND VERIFIED WITH: PHARMD BAILEY AGEE [phone] AT 0825 BY EC    Culture (A)  Final    ESCHERICHIA COLI SUSCEPTIBILITIES TO FOLLOW Performed at [facility], [address] [place], [place] [address]    Report Status PENDING  Incomplete  Blood  Culture ID Panel (Reflexed)     Status: Abnormal   Collection Time: 12/13/22 12:32 PM  Result Value Ref Range Status   Enterococcus faecalis NOT DETECTED NOT DETECTED Final   Enterococcus Faecium NOT DETECTED NOT DETECTED Final   Listeria monocytogenes NOT DETECTED NOT DETECTED Final   Staphylococcus species NOT DETECTED NOT DETECTED Final   Staphylococcus aureus (BCID) NOT DETECTED NOT DETECTED Final   Staphylococcus epidermidis NOT DETECTED NOT DETECTED Final   Staphylococcus lugdunensis NOT DETECTED NOT DETECTED Final   Streptococcus species NOT DETECTED NOT DETECTED Final   Streptococcus agalactiae NOT DETECTED NOT DETECTED Final   Streptococcus pneumoniae NOT DETECTED NOT DETECTED Final   Streptococcus pyogenes NOT DETECTED NOT DETECTED Final   A.calcoaceticus-baumannii NOT DETECTED NOT DETECTED Final    Bacteroides fragilis NOT DETECTED NOT DETECTED Final   Enterobacterales DETECTED (A) NOT DETECTED Final    Comment: Enterobacterales represent a large order of gram negative bacteria, not a single organism. CRITICAL RESULT CALLED TO, READ BACK BY AND VERIFIED WITH: PHARMD BAILEY Stokke [phone] AT 0825 BY EC    Enterobacter cloacae complex NOT DETECTED NOT DETECTED Final   Escherichia coli DETECTED (A) NOT DETECTED Final    Comment: CRITICAL RESULT CALLED TO, READ BACK BY AND VERIFIED WITH: PHARMD BAILEY Diehl [phone] AT 0825 BY EC    Klebsiella aerogenes NOT DETECTED NOT DETECTED Final   Klebsiella oxytoca NOT DETECTED NOT DETECTED Final   Klebsiella pneumoniae NOT DETECTED NOT DETECTED Final   Proteus species NOT DETECTED NOT DETECTED Final   Salmonella species NOT DETECTED NOT DETECTED Final   Serratia marcescens NOT DETECTED NOT DETECTED Final   Haemophilus influenzae NOT DETECTED NOT DETECTED Final   Neisseria meningitidis NOT DETECTED NOT DETECTED Final   Pseudomonas aeruginosa NOT DETECTED NOT DETECTED Final   Stenotrophomonas maltophilia NOT DETECTED NOT DETECTED Final   Candida albicans NOT DETECTED NOT DETECTED Final   Candida auris NOT DETECTED NOT DETECTED Final   Candida glabrata NOT DETECTED NOT DETECTED Final   Candida krusei NOT DETECTED NOT DETECTED Final   Candida parapsilosis NOT DETECTED NOT DETECTED Final   Candida tropicalis NOT DETECTED NOT DETECTED Final   Cryptococcus neoformans/gattii NOT DETECTED NOT DETECTED Final   CTX-M ESBL NOT DETECTED NOT DETECTED Final   Carbapenem resistance IMP NOT DETECTED NOT DETECTED Final   Carbapenem resistance KPC NOT DETECTED NOT DETECTED Final   Carbapenem resistance NDM NOT DETECTED NOT DETECTED Final   Carbapenem resist OXA 48 LIKE NOT DETECTED NOT DETECTED Final   Carbapenem resistance VIM NOT DETECTED NOT DETECTED Final    Comment: Performed at [facility], [address] [place], [place] [address]  Blood  Culture (routine x 2)     Status: None (Preliminary result)   Collection Time: 12/13/22 12:33 PM   Specimen: BLOOD LEFT ARM  Result Value Ref Range Status   Specimen Description BLOOD LEFT ARM  Final   Special Requests   Final    BOTTLES DRAWN AEROBIC AND ANAEROBIC Blood Culture results may not be optimal due to an excessive volume of blood received in culture bottles   Culture  Setup Time   Final    GRAM NEGATIVE RODS AEROBIC BOTTLE ONLY CRITICAL RESULT CALLED TO, READ BACK BY AND VERIFIED WITH: PHARMD ANDREW MEYER ON 12/14/22 @ 1902 BY DRT Performed at [facility], [address] [place], [place] [address]    Culture GRAM NEGATIVE RODS  Final   Report Status PENDING  Incomplete  Urine Culture (for pregnant, neutropenic or urologic patients or patients with an indwelling urinary catheter)     Status: None   Collection Time: 12/13/22  4:30 PM   Specimen: Urine, Clean Catch  Result Value Ref Range Status   Specimen Description URINE, CLEAN CATCH  Final   Special Requests NONE  Final   Culture   Final    NO GROWTH Performed at [facility], [address] [place], [place] [address]    Report Status 12/14/2022 FINAL  Final  Culture, blood (Routine X 2) w Reflex to ID Panel     Status: None (Preliminary result)   Collection Time: 12/14/22 12:16 PM   Specimen: BLOOD  Result Value Ref Range Status   Specimen Description BLOOD BLOOD RIGHT HAND  Final   Special Requests   Final    BOTTLES DRAWN AEROBIC AND ANAEROBIC Blood Culture adequate volume   Culture   Final    NO GROWTH < 24 HOURS Performed at [facility], [address], [place], [place] [address]    Report Status PENDING  Incomplete  Culture, blood (Routine X 2) w Reflex to ID Panel     Status: None (Preliminary result)   Collection Time: 12/14/22 12:17 PM   Specimen: BLOOD  Result Value Ref Range Status   Specimen Description BLOOD LEFT ANTECUBITAL  Final   Special Requests   Final    BOTTLES DRAWN  AEROBIC AND ANAEROBIC Blood Culture adequate volume   Culture   Final    NO GROWTH < 24 HOURS Performed at [facility], [address] [place], [place] [address]    Report Status PENDING  Incomplete     Serology:   Imaging: If present, new imagings (plain films, ct scans, and mri) have been personally visualized and interpreted; radiology reports have been reviewed. Decision making incorporated into the Impression / Recommendations.   Laretta Bolster, MD [facility] [phone] pager    12/15/2022, 1:47 PM

## 2022-12-15 NOTE — Progress Notes (Signed)
Patient called RN to room complaining of acute ShOB, breathing tx given, vitals WNL, MD Pokhrel made aware. See new orders for IV lasix and chest xray.

## 2022-12-15 NOTE — Plan of Care (Signed)

## 2022-12-16 ENCOUNTER — Other Ambulatory Visit (HOSPITAL_COMMUNITY): Payer: Self-pay

## 2022-12-16 DIAGNOSIS — R7881 Bacteremia: Secondary | ICD-10-CM | POA: Diagnosis not present

## 2022-12-16 DIAGNOSIS — B9620 Unspecified Escherichia coli [E. coli] as the cause of diseases classified elsewhere: Secondary | ICD-10-CM | POA: Diagnosis not present

## 2022-12-16 DIAGNOSIS — J189 Pneumonia, unspecified organism: Secondary | ICD-10-CM

## 2022-12-16 DIAGNOSIS — Z95810 Presence of automatic (implantable) cardiac defibrillator: Secondary | ICD-10-CM | POA: Diagnosis not present

## 2022-12-16 LAB — GLUCOSE, CAPILLARY
Glucose-Capillary: 177 mg/dL — ABNORMAL HIGH (ref 70–99)
Glucose-Capillary: 249 mg/dL — ABNORMAL HIGH (ref 70–99)

## 2022-12-16 LAB — CULTURE, BLOOD (ROUTINE X 2)

## 2022-12-16 LAB — CBC
HCT: 36.9 % — ABNORMAL LOW (ref 39.0–52.0)
Hemoglobin: 12.6 g/dL — ABNORMAL LOW (ref 13.0–17.0)
MCH: 30.1 pg (ref 26.0–34.0)
MCHC: 34.1 g/dL (ref 30.0–36.0)
MCV: 88.3 fL (ref 80.0–100.0)
Platelets: 138 10*3/uL — ABNORMAL LOW (ref 150–400)
RBC: 4.18 MIL/uL — ABNORMAL LOW (ref 4.22–5.81)
RDW: 13.4 % (ref 11.5–15.5)
WBC: 7.6 10*3/uL (ref 4.0–10.5)
nRBC: 0.3 % — ABNORMAL HIGH (ref 0.0–0.2)

## 2022-12-16 LAB — BASIC METABOLIC PANEL
Anion gap: 9 (ref 5–15)
BUN: 32 mg/dL — ABNORMAL HIGH (ref 8–23)
CO2: 24 mmol/L (ref 22–32)
Calcium: 8.6 mg/dL — ABNORMAL LOW (ref 8.9–10.3)
Chloride: 101 mmol/L (ref 98–111)
Creatinine, Ser: 1.04 mg/dL (ref 0.61–1.24)
GFR, Estimated: >60 mL/min (ref >60)
Glucose, Bld: 252 mg/dL — ABNORMAL HIGH (ref 70–99)
Potassium: 3.9 mmol/L (ref 3.5–5.1)
Sodium: 134 mmol/L — ABNORMAL LOW (ref 135–145)

## 2022-12-16 LAB — MAGNESIUM: Magnesium: 2.2 mg/dL (ref 1.7–2.4)

## 2022-12-16 MED ORDER — ALBUTEROL SULFATE HFA 108 (90 BASE) MCG/ACT IN AERS
2.0000 | INHALATION_SPRAY | Freq: Four times a day (QID) | RESPIRATORY_TRACT | 1 refills | Status: DC | PRN
  Filled 2022-12-16: qty 18, 25d supply, fill #0

## 2022-12-16 MED ORDER — GUAIFENESIN ER 600 MG PO TB12
600.0000 mg | ORAL_TABLET | Freq: Two times a day (BID) | ORAL | 0 refills | Status: AC
  Filled 2022-12-16: qty 10, 5d supply, fill #0

## 2022-12-16 MED ORDER — CEFADROXIL 500 MG PO CAPS
1000.0000 mg | ORAL_CAPSULE | Freq: Two times a day (BID) | ORAL | 0 refills | Status: AC
  Filled 2022-12-16: qty 28, 7d supply, fill #0

## 2022-12-16 MED ORDER — FUROSEMIDE 10 MG/ML IJ SOLN
40.0000 mg | Freq: Once | INTRAMUSCULAR | Status: AC
  Administered 2022-12-16: 40 mg via INTRAVENOUS
  Filled 2022-12-16: qty 4

## 2022-12-16 NOTE — Progress Notes (Signed)
I have seen the patient and reviewed and agreed with the history/physical exam finding/labs with our medical resident dr Carron Brazen. I agreed with the assessment/plan unless otherwise noted   Patient has volume overload being managed by primary team Otherwise hds and afebrile His repeat bcx had remain negative   No complaint  Exam: Well appearing Going to bathroom No rash Normal respiratory effort     Labs: Reviewed  A/p Sepsis resolved Ecoli bacteremia Presence of icd   Ecoli bacteremia unclear source Unlikely pathogen that would cause icd infection  Given quick clearance and defervescence, would treat as simple bacteremia with 10 day abx total, but would revisit after abx course for repeat evaluation in setting presence Icd    -stop iv abx today -transition to cefadroxil 1000 mg po bid -finish total 10 day abx on 9/19 -id f/u arranged on 10/02 @ 345 with me at rcid -id will sign off -discussed with primary team

## 2022-12-16 NOTE — TOC Transition Note (Signed)
Transition of Care [facility]) - CM/SW Discharge Note   Patient Details  Name: [name] MRN: [id number] Date of Birth: 1940-05-29  Transition of Care [facility]) CM/SW Contact:  Leone Haven, RN Phone Number: 12/16/2022, 1:57 PM   Clinical Narrative:    Patient is for dc today, he has transport home.      Barriers to Discharge: Continued Medical Work up   Patient Goals and CMS Choice   Choice offered to / list presented to : NA  Discharge Placement                         Discharge Plan and Services Additional resources added to the After Visit Summary for   In-house Referral: NA Discharge Planning Services: CM Consult Post Acute Care Choice: NA          DME Arranged: N/A DME Agency: NA       HH Arranged: NA          Social Determinants of Health (SDOH) Interventions SDOH Screenings   Food Insecurity: No Food Insecurity (12/13/2022)  Housing: Low Risk  (12/13/2022)  Transportation Needs: No Transportation Needs (12/13/2022)  Utilities: Not At Risk (12/13/2022)  Tobacco Use: Medium Risk (12/13/2022)     Readmission Risk Interventions     No data to display

## 2022-12-16 NOTE — Progress Notes (Signed)
[facility]  Date of Admission:  12/13/2022     CC: GNR bacteremia    Abx: 9/9 Ceftriaxone  9/9 Azithromycin   ASSESSMENT: [name] is a 82 y.o. male with icd here with sepsis and ecoli bacteremia. Repeat blood cultures show bacteria clearance @48  hrs. From ID perspective, he is good to go home. PLAN:  Will recommend oral Cefadroxil 1g BID for 10 days Follow up in ID clinic with ID due to his presence of ICD  Principal Problem:   Sepsis (HCC) Active Problems:   SLEEP APNEA, OBSTRUCTIVE   Essential hypertension, benign   Atrial fibrillation (HCC)   PNA (pneumonia)   UTI (urinary tract infection)   No Known Allergies  Scheduled Meds:  enoxaparin (LOVENOX) injection  40 mg Subcutaneous Q24H   feeding supplement  237 mL Oral BID BM   fluticasone  1 spray Each Nare Daily   guaiFENesin  600 mg Oral BID   insulin aspart  0-9 Units Subcutaneous TID WC   Continuous Infusions:  cefTRIAXone (ROCEPHIN)  IV 2 g (12/15/22 2140)   PRN Meds:.acetaminophen **OR** acetaminophen, albuterol, HYDROcodone-acetaminophen, ondansetron (ZOFRAN) IV, ondansetron **OR** ondansetron (ZOFRAN) IV, polyethylene glycol   SUBJECTIVE: No new complains. Denies diarrhea or rash.   Review of Systems: ROS All other ROS was negative, except mentioned above  OBJECTIVE: Vitals:   12/16/22 1100 12/16/22 1115 12/16/22 1150 12/16/22 1155  BP:      Pulse:      Resp:      Temp:      TempSrc:      SpO2: 97% 96% 94% 95%  Weight:      Height:       Body mass index is 28.77 kg/m.  Physical Exam   Lab Results Lab Results  Component Value Date   WBC 7.6 12/16/2022   HGB 12.6 (L) 12/16/2022   HCT 36.9 (L) 12/16/2022   MCV 88.3 12/16/2022   PLT 138 (L) 12/16/2022    Lab Results  Component Value Date   CREATININE 1.04 12/16/2022   BUN 32 (H) 12/16/2022   NA 134 (L) 12/16/2022   K 3.9 12/16/2022   CL 101 12/16/2022   CO2 24 12/16/2022    Lab  Results  Component Value Date   ALT 39 12/14/2022   AST 32 12/14/2022   ALKPHOS 58 12/14/2022   BILITOT 1.4 (H) 12/14/2022      Microbiology: Recent Results (from the past 240 hour(s))  Blood Culture (routine x 2)     Status: Abnormal   Collection Time: 12/13/22 12:32 PM   Specimen: BLOOD RIGHT ARM  Result Value Ref Range Status   Specimen Description BLOOD RIGHT ARM  Final   Special Requests   Final    BOTTLES DRAWN AEROBIC AND ANAEROBIC Blood Culture results may not be optimal due to an excessive volume of blood received in culture bottles   Culture  Setup Time   Final    GRAM NEGATIVE RODS IN BOTH AEROBIC AND ANAEROBIC BOTTLES CRITICAL RESULT CALLED TO, READ BACK BY AND VERIFIED WITH: Gwendolyn Fill AGEE [phone] AT 0825 BY EC Performed at [facility], [address] [place], [place] [address]    Culture ESCHERICHIA COLI (A)  Final   Report Status 12/16/2022 FINAL  Final   Organism ID, Bacteria ESCHERICHIA COLI  Final      Susceptibility   Escherichia coli - MIC*    AMPICILLIN 16 INTERMEDIATE Intermediate  CEFEPIME <=0.12 SENSITIVE Sensitive     CEFTAZIDIME <=1 SENSITIVE Sensitive     CEFTRIAXONE <=0.25 SENSITIVE Sensitive     CIPROFLOXACIN <=0.25 SENSITIVE Sensitive     GENTAMICIN <=1 SENSITIVE Sensitive     IMIPENEM <=0.25 SENSITIVE Sensitive     TRIMETH/SULFA <=20 SENSITIVE Sensitive     AMPICILLIN/SULBACTAM 8 SENSITIVE Sensitive     PIP/TAZO <=4 SENSITIVE Sensitive     * ESCHERICHIA COLI  Blood Culture ID Panel (Reflexed)     Status: Abnormal   Collection Time: 12/13/22 12:32 PM  Result Value Ref Range Status   Enterococcus faecalis NOT DETECTED NOT DETECTED Final   Enterococcus Faecium NOT DETECTED NOT DETECTED Final   Listeria monocytogenes NOT DETECTED NOT DETECTED Final   Staphylococcus species NOT DETECTED NOT DETECTED Final   Staphylococcus aureus (BCID) NOT DETECTED NOT DETECTED Final   Staphylococcus epidermidis NOT DETECTED NOT DETECTED Final    Staphylococcus lugdunensis NOT DETECTED NOT DETECTED Final   Streptococcus species NOT DETECTED NOT DETECTED Final   Streptococcus agalactiae NOT DETECTED NOT DETECTED Final   Streptococcus pneumoniae NOT DETECTED NOT DETECTED Final   Streptococcus pyogenes NOT DETECTED NOT DETECTED Final   A.calcoaceticus-baumannii NOT DETECTED NOT DETECTED Final   Bacteroides fragilis NOT DETECTED NOT DETECTED Final   Enterobacterales DETECTED (A) NOT DETECTED Final    Comment: Enterobacterales represent a large order of gram negative bacteria, not a single organism. CRITICAL RESULT CALLED TO, READ BACK BY AND VERIFIED WITH: PHARMD BAILEY Spilman [id number] AT 0825 BY EC    Enterobacter cloacae complex NOT DETECTED NOT DETECTED Final   Escherichia coli DETECTED (A) NOT DETECTED Final    Comment: CRITICAL RESULT CALLED TO, READ BACK BY AND VERIFIED WITH: PHARMD BAILEY Bialy [phone] AT 0825 BY EC    Klebsiella aerogenes NOT DETECTED NOT DETECTED Final   Klebsiella oxytoca NOT DETECTED NOT DETECTED Final   Klebsiella pneumoniae NOT DETECTED NOT DETECTED Final   Proteus species NOT DETECTED NOT DETECTED Final   Salmonella species NOT DETECTED NOT DETECTED Final   Serratia marcescens NOT DETECTED NOT DETECTED Final   Haemophilus influenzae NOT DETECTED NOT DETECTED Final   Neisseria meningitidis NOT DETECTED NOT DETECTED Final   Pseudomonas aeruginosa NOT DETECTED NOT DETECTED Final   Stenotrophomonas maltophilia NOT DETECTED NOT DETECTED Final   Candida albicans NOT DETECTED NOT DETECTED Final   Candida auris NOT DETECTED NOT DETECTED Final   Candida glabrata NOT DETECTED NOT DETECTED Final   Candida krusei NOT DETECTED NOT DETECTED Final   Candida parapsilosis NOT DETECTED NOT DETECTED Final   Candida tropicalis NOT DETECTED NOT DETECTED Final   Cryptococcus neoformans/gattii NOT DETECTED NOT DETECTED Final   CTX-M ESBL NOT DETECTED NOT DETECTED Final   Carbapenem resistance IMP NOT DETECTED NOT  DETECTED Final   Carbapenem resistance KPC NOT DETECTED NOT DETECTED Final   Carbapenem resistance NDM NOT DETECTED NOT DETECTED Final   Carbapenem resist OXA 48 LIKE NOT DETECTED NOT DETECTED Final   Carbapenem resistance VIM NOT DETECTED NOT DETECTED Final    Comment: Performed at [facility], [address], [place], [place] [address]  Blood Culture (routine x 2)     Status: Abnormal   Collection Time: 12/13/22 12:33 PM   Specimen: BLOOD LEFT ARM  Result Value Ref Range Status   Specimen Description BLOOD LEFT ARM  Final   Special Requests   Final    BOTTLES DRAWN AEROBIC AND ANAEROBIC Blood Culture results may not be optimal due to  an excessive volume of blood received in culture bottles   Culture  Setup Time   Final    GRAM NEGATIVE RODS AEROBIC BOTTLE ONLY CRITICAL RESULT CALLED TO, READ BACK BY AND VERIFIED WITH: PHARMD ANDREW MEYER ON 12/14/22 @ 1902 BY DRT    Culture (A)  Final    ESCHERICHIA COLI SUSCEPTIBILITIES PERFORMED ON PREVIOUS CULTURE WITHIN THE LAST 5 DAYS. Performed at [facility], [address] [place], [place] [address]    Report Status 12/16/2022 FINAL  Final  Urine Culture (for pregnant, neutropenic or urologic patients or patients with an indwelling urinary catheter)     Status: None   Collection Time: 12/13/22  4:30 PM   Specimen: Urine, Clean Catch  Result Value Ref Range Status   Specimen Description URINE, CLEAN CATCH  Final   Special Requests NONE  Final   Culture   Final    NO GROWTH Performed at [facility], [address], [place], [place] [address]    Report Status 12/14/2022 FINAL  Final  Culture, blood (Routine X 2) w Reflex to ID Panel     Status: None (Preliminary result)   Collection Time: 12/14/22 12:16 PM   Specimen: BLOOD  Result Value Ref Range Status   Specimen Description BLOOD BLOOD RIGHT HAND  Final   Special Requests   Final    BOTTLES DRAWN AEROBIC AND ANAEROBIC Blood Culture adequate volume   Culture    Final    NO GROWTH 2 DAYS Performed at [facility], [address] [place], [place] [address]    Report Status PENDING  Incomplete  Culture, blood (Routine X 2) w Reflex to ID Panel     Status: None (Preliminary result)   Collection Time: 12/14/22 12:17 PM   Specimen: BLOOD  Result Value Ref Range Status   Specimen Description BLOOD LEFT ANTECUBITAL  Final   Special Requests   Final    BOTTLES DRAWN AEROBIC AND ANAEROBIC Blood Culture adequate volume   Culture   Final    NO GROWTH 2 DAYS Performed at [facility], [address], [place], [place] [address]    Report Status PENDING  Incomplete     Serology:   Imaging: If present, new imagings (plain films, ct scans, and mri) have been personally visualized and interpreted; radiology reports have been reviewed. Decision making incorporated into the Impression / Recommendations.   Laretta Bolster, MD [facility]ious [facility] [phone] pager    12/16/2022, 1:27 PM

## 2022-12-16 NOTE — Discharge Summary (Addendum)
Physician Discharge Summary  Demaree [facility] BJS:[id number] DOB: Aug 12, 1940 DOA: 12/13/2022  PCP: Gaspar Garbe, MD  Admit date: 12/13/2022 Discharge date: 12/16/2022  Admitted From: Home  Discharge disposition: Home   Recommendations for Outpatient Follow-Up:   Follow up with your primary care provider in one week.  Check CBC, BMP, magnesium in the next visit Follow-up with infectious disease clinic as has been scheduled on 01/05/2023. Follow-up with your cardiology as scheduled by the clinic.  Discharge Diagnosis:   Principal Problem:   Sepsis (HCC) Active Problems:   SLEEP APNEA, OBSTRUCTIVE   Essential hypertension, benign   Atrial fibrillation (HCC)   PNA (pneumonia)   UTI (urinary tract infection)   Discharge Condition: Improved.  Diet recommendation: Low sodium, heart healthy.    Wound care: None.  Code status: Full.   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  In the ED, patient was hypotensive with a lactic acidosis at 2.8 and leukocytosis at 15.  CT abdomen and pelvis showed previous cholecystectomy pneumobilia, perinephric stranding of the kidney. Chest x-ray left greater than right opacity could represent pneumonia or edema.  UA with 6-10 white blood cell, large hemoglobin.  Patient was then admitted hospital for sepsis secondary to UTI versus pneumonia. Patient was subsequently found to have E. coli bacteremia and infectious disease was consulted.    Hospital Course:   Following conditions were addressed during hospitalization as listed below,  Severe Sepsis secondary to UTI Hypotension.   Severe sepsis as evidenced by hypotension, elevated troponins, elevated lactate  at 2.8 with a leukocytosis at 15,000 secondary to potential source as UTI..  Initial chest x ray showed pneumonia versus pulmonary edema.  Urinalysis was abnormal.  CT scan of the abdomen with some pneumobilia, previous cholecystectomy.  Patient initially received IV Rocephin and Zithromax during hospitalization but  blood cultures from 12/13/2022 showing E. coli bacteremia.  Repeat blood cultures have been sent on 12/14/2022 negative in 2 days.  Infectious disease has been consulted and patient has been considered stable for for disposition home on cefadroxil for next 7 days on discharge.  Communicated with infectious disease about it.  Of note patient did have CT angiogram of the chest on 12/13/2022 showed no evidence of pulmonary embolism or pneumonia.    Dyspnea, shortness of breath.  Secondary to IV fluids given during initial resuscitation.  Chest x-ray showed congestion.  Received IV 40 Lasix twice yesterday and 1 dose of 40 mg IV Lasix this morning.  Patient will resume 40 mg of Lasix orally daily on discharge.  Patient is also on spironolactone and Farxiga at home.  Patient did not qualify for home oxygen on discharge.  Pulse ox was 92 to 93% on ambulation.  Mild elevation of troponin;  In setting sepsis.  No chest pain or EKG changes.   Diabetes mellitus type II  Patient is in fargixa, semaglutide at home.   Lactic acidosis; in setting of sepsis.  Received IV fluids initially and antibiotics on discharge.   Essential HTN Initially was hypotensive.  Will resume diuretics on discharge including lisinopril.   H/O non ischemic cardiomyopathy.  Improvement of EF last ECHO. H/O AV replacement bovine, 2009.  Patient follows up with Dr. Jens Som cardiology as outpatient.  He does have an  upcoming appointment.  He is advised to keep that appointment.   Ulceration left buttocks 2.  Present on admission.  Continue wound care at home.  Disposition.  At this time, patient is stable for disposition home  with outpatient PCP, ID and cardiology follow-up.  Spoke with the patient's wife and multiple family members at bedside.  Medical Consultants:   Infectious disease  Procedures:    None Subjective:   Today, patient was seen and examined at bedside.  Continues to feel better.  Family at bedside.  Denies any nausea vomiting fever chills or rigor.  Has mild cough and shortness of breath on exertion.  Discharge Exam:   Vitals:   12/16/22 1150 12/16/22 1155  BP:    Pulse:    Resp:    Temp:    SpO2: 94% 95%   Vitals:   12/16/22 1100 12/16/22 1115 12/16/22 1150 12/16/22 1155  BP:      Pulse:      Resp:      Temp:      TempSrc:      SpO2: 97% 96% 94% 95%  Weight:      Height:       Body mass index is 28.77 kg/m.   General: Alert awake, not in obvious distress HENT: pupils equally reacting to light,  No scleral pallor or icterus noted. Oral mucosa is moist.  Chest:   Diminished breath sounds bilaterally.  CVS: S1 &S2 heard. No murmur.  Regular rate and rhythm. Abdomen: Soft, nontender, nondistended.  Bowel sounds are heard.   Extremities: No cyanosis, clubbing or edema.  Peripheral pulses are palpable. Psych: Alert, awake and oriented, normal mood CNS:  No cranial nerve deficits.  Moves all extremities. Skin: Warm and dry.  No rashes noted.  The results of significant diagnostics from this hospitalization (including imaging, microbiology, ancillary and laboratory) are listed below for reference.     Diagnostic Studies:   CT Angio Chest PE W and/or Wo Contrast  Result Date: 12/13/2022 CLINICAL DATA:  Suspected pulmonary embolism. EXAM: CT ANGIOGRAPHY CHEST WITH CONTRAST TECHNIQUE: Multidetector CT imaging of the chest was performed using the standard protocol during bolus administration of intravenous contrast. Multiplanar CT image reconstructions and MIPs were obtained to evaluate the vascular anatomy. RADIATION DOSE REDUCTION: This exam was performed according to the  departmental dose-optimization program which includes automated exposure control, adjustment of the mA and/or kV according to patient size and/or use of iterative reconstruction technique. CONTRAST:  60mL OMNIPAQUE IOHEXOL 350 MG/ML SOLN, 75mL OMNIPAQUE IOHEXOL 350 MG/ML SOLN COMPARISON:  December 25, 2021 FINDINGS: Cardiovascular: A dual lead AICD is noted. There is marked severity calcification of the thoracic aorta, without evidence of aortic aneurysm. An artificial aortic valve is seen. Satisfactory opacification of the pulmonary arteries to the segmental level. No evidence of pulmonary embolism. Normal heart size with marked severity coronary artery calcification. No pericardial effusion. Mediastinum/Nodes: No enlarged mediastinal, hilar, or axillary lymph nodes. Thyroid gland, trachea, and esophagus demonstrate no significant findings. Lungs/Pleura: There is evidence of mild paraseptal emphysematous lung disease. Mild atelectasis is noted within the posterior aspects of the bilateral lower lobes. There is no evidence of an acute infiltrate, pleural effusion or pneumothorax. Upper Abdomen: There is a small hiatal hernia. A moderate amount of central pneumobilia is seen. Musculoskeletal: Multiple sternal wires are present. A metallic density fusion plate and screws are seen within the lower cervical spine. Multilevel degenerative changes are noted throughout the thoracic spine. Review of the MIP images  confirms the above findings. IMPRESSION: 1. No evidence of pulmonary embolism. 2. Evidence of prior median and aortic valve replacement. 3. Small hiatal hernia. 4. Moderate amount of central pneumobilia. Aortic Atherosclerosis (ICD10-I70.0) and Emphysema (ICD10-J43.9). Electronically Signed   By: Aram Candela M.D.   On: 12/13/2022 20:30   CT ABDOMEN PELVIS W CONTRAST  Result Date: 12/13/2022 CLINICAL DATA:  Bowel obstruction.  Pain EXAM: CT ABDOMEN AND PELVIS WITH CONTRAST TECHNIQUE: Multidetector CT  imaging of the abdomen and pelvis was performed using the standard protocol following bolus administration of intravenous contrast. RADIATION DOSE REDUCTION: This exam was performed according to the departmental dose-optimization program which includes automated exposure control, adjustment of the mA and/or kV according to patient size and/or use of iterative reconstruction technique. CONTRAST:  75mL OMNIPAQUE IOHEXOL 350 MG/ML SOLN COMPARISON:  None Available. FINDINGS: Lower chest: Heart is mildly enlarged. Defibrillator leads along the heart. There is some linear opacity at the bases likely scar or atelectasis. Hepatobiliary: Diffuse fatty liver infiltration. Previous cholecystectomy. Patent portal vein. Air in the biliary tree. There is some low density seen in the area of the gallbladder fossa but this could be focal fat deposition. Pancreas: Unremarkable. No pancreatic ductal dilatation or surrounding inflammatory changes. Spleen: Splenic granulomas are identified. Spleen is enlarged at 16.0 cm. Adrenals/Urinary Tract: Slight thickening of the left adrenal gland. Right adrenal gland is preserved. Moderate perinephric stranding. No enhancing mass or collecting system dilatation. However there is delayed excretion of contrast on the delayed dataset. Please correlate with the patient's renal function. The ureters have normal course and caliber extending down to the bladder. Preserved contours of the urinary bladder. Bladder is underdistended. Stomach/Bowel: On this non oral contrast exam, large bowel has a normal course and caliber with scattered stool. Small bowel is nondilated. Stomach is underdistended. Second portion duodenal diverticulum. Vascular/Lymphatic: Scattered vascular calcifications are seen. Normal caliber aorta and IVC. No discrete abnormal lymph node enlargement identified in the abdomen and pelvis. Reproductive: Prominent prostate with mass effect along the base of the bladder. Other: No free  air or free fluid. Small bilateral fat containing inguinal hernias. Musculoskeletal: Osteopenia. Scattered degenerative changes of the spine and pelvis. Trace retrolisthesis of L3 on L4. IMPRESSION: Scattered colonic stool.  No bowel obstruction.  No free air. Previous cholecystectomy. There is air in the biliary tree. Please correlate for any previous instrumentation. This was seen previously. Poor excretion from the kidneys with perinephric stranding. Please correlate with the patient's renal function. Splenic enlargement. Electronically Signed   By: Karen Kays M.D.   On: 12/13/2022 16:05   DG Chest Port 1 View  Result Date: 12/13/2022 CLINICAL DATA:  Questionable sepsis. Difficulty urinating, fever, hypotension, vomiting, and shortness of breath. EXAM: PORTABLE CHEST 1 VIEW COMPARISON:  Chest radiographs 11/25/2017 and CT 12/25/2021 FINDINGS: A 3 lead pacemaker/ICD remains in place. The cardiac silhouette is borderline enlarged. Predominantly interstitial type opacities are present in the left greater than right lungs. No sizable pleural effusion or pneumothorax is identified. Prior cervical spine fusion is noted. IMPRESSION: Left greater than right lung opacities which could reflect pneumonia or edema. Electronically Signed   By: Sebastian Ache M.D.   On: 12/13/2022 13:40     Labs:   Basic Metabolic Panel: Recent Labs  Lab 12/13/22 1232 12/13/22 1241 12/13/22 2113 12/14/22 0232 12/16/22 0253  NA 138 141  --  135 134*  K 4.0 4.0  --  3.6 3.9  CL 105 106  --  102 101  CO2 23  --   --  23 24  GLUCOSE 239* 250*  --  138* 252*  BUN 40* 39*  --  38* 32*  CREATININE 1.38* 1.30* 1.38* 1.31* 1.04  CALCIUM 8.7*  --   --  8.1* 8.6*  MG  --   --   --   --  2.2   GFR Estimated Creatinine Clearance: 61 mL/min (by C-G formula based on SCr of 1.04 mg/dL). Liver Function Tests: Recent Labs  Lab 12/13/22 1232 12/14/22 0232  AST 32 32  ALT 43 39  ALKPHOS 77 58  BILITOT 2.2* 1.4*  PROT 6.0*  5.0*  ALBUMIN 3.5 2.8*   No results for input(s): "LIPASE", "AMYLASE" in the last 168 hours. No results for input(s): "AMMONIA" in the last 168 hours. Coagulation profile Recent Labs  Lab 12/13/22 1232  INR 1.1    CBC: Recent Labs  Lab 12/13/22 1232 12/13/22 1241 12/13/22 2113 12/14/22 0232 12/16/22 0253  WBC 15.2*  --  12.9* 9.6 7.6  NEUTROABS 13.6*  --   --   --   --   HGB 13.6 13.3 12.3* 11.3* 12.6*  HCT 41.2 39.0 37.3* 34.5* 36.9*  MCV 87.7  --  88.4 88.5 88.3  PLT 176  --  153 151 138*   Cardiac Enzymes: No results for input(s): "CKTOTAL", "CKMB", "CKMBINDEX", "TROPONINI" in the last 168 hours. BNP: Invalid input(s): "POCBNP" CBG: Recent Labs  Lab 12/15/22 1111 12/15/22 1552 12/15/22 2100 12/16/22 0555 12/16/22 1044  GLUCAP 200* 266* 184* 177* 249*   D-Dimer No results for input(s): "DDIMER" in the last 72 hours. Hgb A1c Recent Labs    12/13/22 1843  HGBA1C 7.4*   Lipid Profile No results for input(s): "CHOL", "HDL", "LDLCALC", "TRIG", "CHOLHDL", "LDLDIRECT" in the last 72 hours. Thyroid function studies No results for input(s): "TSH", "T4TOTAL", "T3FREE", "THYROIDAB" in the last 72 hours.  Invalid input(s): "FREET3" Anemia work up No results for input(s): "VITAMINB12", "FOLATE", "FERRITIN", "TIBC", "IRON", "RETICCTPCT" in the last 72 hours. Microbiology Recent Results (from the past 240 hour(s))  Blood Culture (routine x 2)     Status: Abnormal   Collection Time: 12/13/22 12:32 PM   Specimen: BLOOD RIGHT ARM  Result Value Ref Range Status   Specimen Description BLOOD RIGHT ARM  Final   Special Requests   Final    BOTTLES DRAWN AEROBIC AND ANAEROBIC Blood Culture results may not be optimal due to an excessive volume of blood received in culture bottles   Culture  Setup Time   Final    GRAM NEGATIVE RODS IN BOTH AEROBIC AND ANAEROBIC BOTTLES CRITICAL RESULT CALLED TO, READ BACK BY AND VERIFIED WITH: Ros[place] Nickel [id number] AT 0825 BY  EC Performed at [facility], [address] [place], [place] [address]    Culture ESCHERICHIA COLI (A)  Final   Report Status 12/16/2022 FINAL  Final   Organism ID, Bacteria ESCHERICHIA COLI  Final      Susceptibility   Escherichia coli - MIC*    AMPICILLIN 16 INTERMEDIATE Intermediate     CEFEPIME <=0.12 SENSITIVE Sensitive     CEFTAZIDIME <=1 SENSITIVE Sensitive     CEFTRIAXONE <=0.25 SENSITIVE Sensitive     CIPROFLOXACIN <=0.25 SENSITIVE Sensitive     GENTAMICIN <=1 SENSITIVE Sensitive     IMIPENEM <=0.25 SENSITIVE Sensitive     TRIMETH/SULFA <=20 SENSITIVE Sensitive     AMPICILLIN/SULBACTAM 8 SENSITIVE Sensitive     PIP/TAZO <=4 SENSITIVE Sensitive     *  ESCHERICHIA COLI  Blood Culture ID Panel (Reflexed)     Status: Abnormal   Collection Time: 12/13/22 12:32 PM  Result Value Ref Range Status   Enterococcus faecalis NOT DETECTED NOT DETECTED Final   Enterococcus Faecium NOT DETECTED NOT DETECTED Final   Listeria monocytogenes NOT DETECTED NOT DETECTED Final   Staphylococcus species NOT DETECTED NOT DETECTED Final   Staphylococcus aureus (BCID) NOT DETECTED NOT DETECTED Final   Staphylococcus epidermidis NOT DETECTED NOT DETECTED Final   Staphylococcus lugdunensis NOT DETECTED NOT DETECTED Final   Streptococcus species NOT DETECTED NOT DETECTED Final   Streptococcus agalactiae NOT DETECTED NOT DETECTED Final   Streptococcus pneumoniae NOT DETECTED NOT DETECTED Final   Streptococcus pyogenes NOT DETECTED NOT DETECTED Final   A.calcoaceticus-baumannii NOT DETECTED NOT DETECTED Final   Bacteroides fragilis NOT DETECTED NOT DETECTED Final   Enterobacterales DETECTED (A) NOT DETECTED Final    Comment: Enterobacterales represent a large order of gram negative bacteria, not a single organism. CRITICAL RESULT CALLED TO, READ BACK BY AND VERIFIED WITH: PHARMD BAILEY Auvil [id number] AT 0825 BY EC    Enterobacter cloacae complex NOT DETECTED NOT DETECTED Final   Escherichia  coli DETECTED (A) NOT DETECTED Final    Comment: CRITICAL RESULT CALLED TO, READ BACK BY AND VERIFIED WITH: PHARMD BAILEY Rufus [id number] AT 0825 BY EC    Klebsiella aerogenes NOT DETECTED NOT DETECTED Final   Klebsiella oxytoca NOT DETECTED NOT DETECTED Final   Klebsiella pneumoniae NOT DETECTED NOT DETECTED Final   Proteus species NOT DETECTED NOT DETECTED Final   Salmonella species NOT DETECTED NOT DETECTED Final   Serratia marcescens NOT DETECTED NOT DETECTED Final   Haemophilus influenzae NOT DETECTED NOT DETECTED Final   Neisseria meningitidis NOT DETECTED NOT DETECTED Final   Pseudomonas aeruginosa NOT DETECTED NOT DETECTED Final   Stenotrophomonas maltophilia NOT DETECTED NOT DETECTED Final   Candida albicans NOT DETECTED NOT DETECTED Final   Candida auris NOT DETECTED NOT DETECTED Final   Candida glabrata NOT DETECTED NOT DETECTED Final   Candida krusei NOT DETECTED NOT DETECTED Final   Candida parapsilosis NOT DETECTED NOT DETECTED Final   Candida tropicalis NOT DETECTED NOT DETECTED Final   Cryptococcus neoformans/gattii NOT DETECTED NOT DETECTED Final   CTX-M ESBL NOT DETECTED NOT DETECTED Final   Carbapenem resistance IMP NOT DETECTED NOT DETECTED Final   Carbapenem resistance KPC NOT DETECTED NOT DETECTED Final   Carbapenem resistance NDM NOT DETECTED NOT DETECTED Final   Carbapenem resist OXA 48 LIKE NOT DETECTED NOT DETECTED Final   Carbapenem resistance VIM NOT DETECTED NOT DETECTED Final    Comment: Performed at [facility], [address], [place], [place] [address]  Blood Culture (routine x 2)     Status: Abnormal   Collection Time: 12/13/22 12:33 PM   Specimen: BLOOD LEFT ARM  Result Value Ref Range Status   Specimen Description BLOOD LEFT ARM  Final   Special Requests   Final    BOTTLES DRAWN AEROBIC AND ANAEROBIC Blood Culture results may not be optimal due to an excessive volume of blood received in culture bottles   Culture  Setup Time   Final     GRAM NEGATIVE RODS AEROBIC BOTTLE ONLY CRITICAL RESULT CALLED TO, READ BACK BY AND VERIFIED WITH: PHARMD ANDREW MEYER ON 12/14/22 @ 1902 BY DRT    Culture (A)  Final    ESCHERICHIA COLI SUSCEPTIBILITIES PERFORMED ON PREVIOUS CULTURE WITHIN THE LAST 5 DAYS. Performed at [facility]  Lab, [address] [place], [place] [address]    Report Status 12/16/2022 FINAL  Final  Urine Culture (for pregnant, neutropenic or urologic patients or patients with an indwelling urinary catheter)     Status: None   Collection Time: 12/13/22  4:30 PM   Specimen: Urine, Clean Catch  Result Value Ref Range Status   Specimen Description URINE, CLEAN CATCH  Final   Special Requests NONE  Final   Culture   Final    NO GROWTH Performed at [facility], [address] [place], [place] [address]    Report Status 12/14/2022 FINAL  Final  Culture, blood (Routine X 2) w Reflex to ID Panel     Status: None (Preliminary result)   Collection Time: 12/14/22 12:16 PM   Specimen: BLOOD  Result Value Ref Range Status   Specimen Description BLOOD BLOOD RIGHT HAND  Final   Special Requests   Final    BOTTLES DRAWN AEROBIC AND ANAEROBIC Blood Culture adequate volume   Culture   Final    NO GROWTH 2 DAYS Performed at [facility], [address], [place], [place] [address]    Report Status PENDING  Incomplete  Culture, blood (Routine X 2) w Reflex to ID Panel     Status: None (Preliminary result)   Collection Time: 12/14/22 12:17 PM   Specimen: BLOOD  Result Value Ref Range Status   Specimen Description BLOOD LEFT ANTECUBITAL  Final   Special Requests   Final    BOTTLES DRAWN AEROBIC AND ANAEROBIC Blood Culture adequate volume   Culture   Final    NO GROWTH 2 DAYS Performed at [facility], [address], [place], [place] [address]    Report Status PENDING  Incomplete     Discharge Instructions:   Discharge Instructions     (HEART FAILURE PATIENTS) Call MD:  Anytime you have any of  the following symptoms: 1) 3 pound weight gain in 24 hours or 5 pounds in 1 week 2) shortness of breath, with or without a dry hacking cough 3) swelling in the hands, feet or stomach 4) if you have to sleep on extra pillows at night in order to breathe.   Complete by: As directed    Call MD for:  severe uncontrolled pain   Complete by: As directed    Call MD for:  temperature >100.4   Complete by: As directed    Diet - low sodium heart healthy   Complete by: As directed    Discharge instructions   Complete by: As directed    Follow-up with your primary care provider in 1 week.  Check blood work at that time.  Complete the course of antibiotic.  Follow-up with infectious disease on 01/05/2023 at 3:45 PM.  Do not overexert.  Seek medical attention for worsening symptoms.  Follow-up with your cardiologist as outpatient as has been scheduled by you.   Discharge wound care:   Complete by: As directed    Pressure ulcer care.   Increase activity slowly   Complete by: As directed    No dressing needed   Complete by: As directed       Allergies as of 12/16/2022   No Known Allergies      Medication List     STOP taking these medications    ibuprofen 200 MG tablet Commonly known as: ADVIL   metFORMIN 1000 MG tablet Commonly known as: GLUCOPHAGE  TAKE these medications    albuterol 108 (90 Base) MCG/ACT inhaler Commonly known as: VENTOLIN HFA Inhale 2 puffs into the lungs every 6 (six) hours as needed for wheezing or shortness of breath.   aspirin 81 MG chewable tablet Chew 81 mg by mouth daily.   carvedilol 12.5 MG tablet Commonly known as: COREG Take 1 tablet (12.5 mg total) by mouth 2 (two) times daily with a meal.   cefadroxil 500 MG capsule Commonly known as: DURICEF Take 2 capsules (1,000 mg total) by mouth 2 (two) times daily for 7 days.   Farxiga 10 MG Tabs tablet Generic drug: dapagliflozin propanediol Take 1 tablet (10 mg total) by mouth daily.    furosemide 40 MG tablet Commonly known as: LASIX Take 1 tablet by mouth once daily. Take an extra tablet as needed for swelling or shortness of breath What changed:  how much to take how to take this when to take this additional instructions   guaiFENesin 600 MG 12 hr tablet Commonly known as: MUCINEX Take 1 tablet (600 mg total) by mouth 2 (two) times daily for 5 days.   lisinopril 40 MG tablet Commonly known as: ZESTRIL Take 1 tablet (40 mg total) by mouth daily.   OneTouch Verio test strip Generic drug: glucose blood Use to test blood sugar once a day.   Ozempic (1 MG/DOSE) 4 MG/3ML Sopn Generic drug: Semaglutide (1 MG/DOSE) Inject 1 mg into the skin once a week.   spironolactone 25 MG tablet Commonly known as: ALDACTONE TAKE 1 TABLET BY MOUTH ONCE DAILY               Discharge Care Instructions  (From admission, onward)           Start     Ordered   12/16/22 0000  No dressing needed        12/16/22 1338   12/16/22 0000  Discharge wound care:       Comments: Pressure ulcer care.   12/16/22 1338            Follow-up Information     [name], Adelfa Koh, MD Follow up.   Specialty: Internal Medicine Contact information: [address] [place] [address] [phone]         Raymondo Band, MD Follow up on 01/05/2023.   Specialty: Infectious Diseases Why: 3:45 pm - please arrive 15 minutes early to register. Contact information: [address] [place] [address] [phone]                  Time coordinating discharge: 39 minutes  Signed:  Milik Gilreath  [facility] 12/16/2022, 1:50 PM

## 2022-12-19 LAB — CULTURE, BLOOD (ROUTINE X 2)
Culture: NO GROWTH
Culture: NO GROWTH
Special Requests: ADEQUATE
Special Requests: ADEQUATE

## 2022-12-20 ENCOUNTER — Other Ambulatory Visit (HOSPITAL_COMMUNITY): Payer: Self-pay

## 2022-12-20 DIAGNOSIS — L89321 Pressure ulcer of left buttock, stage 1: Secondary | ICD-10-CM | POA: Insufficient documentation

## 2022-12-20 DIAGNOSIS — A4151 Sepsis due to Escherichia coli [E. coli]: Secondary | ICD-10-CM | POA: Insufficient documentation

## 2023-01-05 ENCOUNTER — Ambulatory Visit: Payer: Medicare Other | Admitting: Internal Medicine

## 2023-01-05 ENCOUNTER — Other Ambulatory Visit: Payer: Self-pay

## 2023-01-05 ENCOUNTER — Encounter: Payer: Self-pay | Admitting: Internal Medicine

## 2023-01-05 VITALS — BP 116/73 | HR 77 | Resp 16 | Ht 70.0 in | Wt 196.0 lb

## 2023-01-05 DIAGNOSIS — R7881 Bacteremia: Secondary | ICD-10-CM

## 2023-01-05 NOTE — Progress Notes (Signed)
[facility]  Patient Active Problem List   Diagnosis Date Noted   Pressure injury of left buttock, stage 1 12/20/2022   Sepsis due to Escherichia coli (HCC) 12/20/2022   Sepsis (HCC) 12/13/2022   PNA (pneumonia) 12/13/2022   UTI (urinary tract infection) 12/13/2022   LBBB (left bundle branch block) 06/11/2021   Typical atrial flutter (HCC)    Abdominal aortic aneurysm (HCC) 02/20/2014   Encounter for therapeutic drug monitoring 08/03/2013   Bruit 02/14/2013   Hypertension    Nonischemic cardiomyopathy (HCC) 02/03/2011   Biventricular implantable cardioverter-defibrillator in situ 08/04/2010   Atrial fibrillation (HCC) 06/09/2010   SYSTOLIC HEART FAILURE, CHRONIC 01/26/2010   Essential hypertension, benign 05/07/2009   AORTIC VALVE REPLACEMENT, HX OF 05/07/2009   SLEEP APNEA, OBSTRUCTIVE 05/06/2009   Aortic valve disorder 05/06/2009   Chronic combined systolic and diastolic heart failure (HCC) 05/06/2009   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/06/2009   Calculus of gallbladder 05/06/2009   Osteoarthritis 05/06/2009   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   MRN: [id number]  Chief Complaint  Patient presents with   Follow-up    HPI:  [name] is a 82 y.o. male with icd and ecoli bacteremia here for hospital f/u   See a&LP for detail   No Known Allergies    Outpatient Medications Prior to Visit  Medication Sig Dispense Refill   albuterol (VENTOLIN HFA) 108 (90 Base) MCG/ACT inhaler Inhale 2 puffs into the lungs every 6 (six) hours as needed for wheezing or shortness of breath. 18 g 1   aspirin 81 MG chewable tablet Chew 81 mg by mouth daily.     carvedilol (COREG) 12.5 MG tablet Take 1 tablet (12.5 mg total) by mouth 2 (two) times daily with a meal. 180 tablet 1   dapagliflozin propanediol (FARXIGA) 10 MG TABS  tablet Take 1 tablet (10 mg total) by mouth daily. 90 tablet 2   furosemide (LASIX) 40 MG tablet Take 1 tablet by mouth once daily. Take an extra tablet as needed for swelling or shortness of breath (Patient taking differently: Take 40 mg by mouth daily.) 90 tablet 1   glucose blood (ONETOUCH VERIO) test strip Use to test blood sugar once a day. 100 each 5   lisinopril (ZESTRIL) 40 MG tablet Take 1 tablet (40 mg total) by mouth daily. 90 tablet 3   Semaglutide, 1 MG/DOSE, (OZEMPIC, 1 MG/DOSE,) 4 MG/3ML SOPN Inject 1 mg into the skin once a week. 3 mL 5   spironolactone (ALDACTONE) 25 MG tablet TAKE 1 TABLET BY MOUTH ONCE DAILY 90 tablet 2   No facility-administered medications prior to visit.     Social History   Socioeconomic History   Marital status: Married    Spouse name: Not on file   Number of children: 2   Years of education: Not on file   Highest education level: Not on file  Occupational History   Occupation: retired Art gallery manager  Tobacco Use   Smoking status: Former   Smokeless tobacco: Never  Advertising account planner   Vaping status: Never Used  Substance and Sexual Activity   Alcohol use: No   Drug use: No   Sexual activity: Not on file  Other Topics Concern   Not on file  Social History Narrative  Not on file   Social Determinants of Health   Financial Resource Strain: Not on file  Food Insecurity: No Food Insecurity (12/13/2022)   Hunger Vital Sign    Worried About Running Out of Food in the Last Year: Never true    Ran Out of Food in the Last Year: Never true  Transportation Needs: No Transportation Needs (12/13/2022)   PRAPARE - Administrator, Civil Service (Medical): No    Lack of Transportation (Non-Medical): No  Physical Activity: Not on file  Stress: Not on file  Social Connections: Not on file  Intimate Partner Violence: Not on file      Review of Systems    All other ros negative  Objective:    BP 116/73   Pulse 77   Resp 16   Ht 5\' 10"   (1.778 m)   Wt 196 lb (88.9 kg)   BMI 28.12 kg/m  Nursing note and vital signs reviewed.  Physical Exam     General/constitutional: no distress, pleasant HEENT: Normocephalic, PER, Conj Clear, EOMI, Oropharynx clear Neck supple CV: rrr no mrg Lungs: clear to auscultation, normal respiratory effort Abd: Soft, Nontender Ext: no edema Skin: No Rash Neuro: nonfocal MSK: no peripheral joint swelling/tenderness/warmth; back spines nontender  Left chest pacer site no tenderness/redness/fluctuance   Labs: Lab Results  Component Value Date   WBC 7.6 12/16/2022   HGB 12.6 (L) 12/16/2022   HCT 36.9 (L) 12/16/2022   MCV 88.3 12/16/2022   PLT 138 (L) 12/16/2022   Last metabolic panel Lab Results  Component Value Date   GLUCOSE 252 (H) 12/16/2022   NA 134 (L) 12/16/2022   K 3.9 12/16/2022   CL 101 12/16/2022   CO2 24 12/16/2022   BUN 32 (H) 12/16/2022   CREATININE 1.04 12/16/2022   GFRNONAA >60 12/16/2022   CALCIUM 8.6 (L) 12/16/2022   PROT 5.0 (L) 12/14/2022   ALBUMIN 2.8 (L) 12/14/2022   BILITOT 1.4 (H) 12/14/2022   ALKPHOS 58 12/14/2022   AST 32 12/14/2022   ALT 39 12/14/2022   ANIONGAP 9 12/16/2022    Micro:  Serology:  Imaging:  Assessment & Plan:   Problem List Items Addressed This Visit   None Visit Diagnoses     Bacteremia    -  Primary         No orders of the defined types were placed in this encounter.    A/p Sepsis resolved Ecoli bacteremia Presence of icd   82 yo male with icd admitted with sepsis here for f/u   Ecoli bacteremia unclear source Unlikely pathogen that would cause icd infection   Given quick clearance and defervescence, would treat as simple bacteremia with 10 day abx total, but would revisit after abx course for repeat evaluation in setting presence Icd   --------------- 01/05/23 id clinic assessment he finished 10 day total abx 12/23/22 He feels a hundred percent at this time pretty much almost recovered before  discharged from hospital  Reviewed labs from hospital discharge  No back pain, joint cough, short of breath, fever, chill Appetite is 100%  Advise patient that to continue monitoring for sx and sign of infection as asked above -- if continues to feel well within the next couples weeks no need to follow up  No need for labs today    Follow-up: Return if symptoms worsen or fail to improve.      Raymondo Band, MD [facility]  01/05/2023, 3:42 PM

## 2023-01-05 NOTE — Patient Instructions (Signed)
Please monitor for fever, chill, joint pain, cough, short of breath  If these occur within the next 2-4 weeks let me know right away  Otherwise if after this period you are free of infection

## 2023-01-25 ENCOUNTER — Other Ambulatory Visit (HOSPITAL_BASED_OUTPATIENT_CLINIC_OR_DEPARTMENT_OTHER): Payer: Self-pay

## 2023-01-25 ENCOUNTER — Other Ambulatory Visit: Payer: Self-pay | Admitting: Cardiology

## 2023-01-25 MED ORDER — CARVEDILOL 12.5 MG PO TABS
12.5000 mg | ORAL_TABLET | Freq: Two times a day (BID) | ORAL | 2 refills | Status: DC
  Filled 2023-01-25: qty 180, 90d supply, fill #0
  Filled 2023-02-24 – 2023-05-26 (×2): qty 180, 90d supply, fill #1
  Filled 2023-09-29: qty 180, 90d supply, fill #2

## 2023-01-28 ENCOUNTER — Ambulatory Visit (INDEPENDENT_AMBULATORY_CARE_PROVIDER_SITE_OTHER): Payer: Medicare Other

## 2023-01-28 DIAGNOSIS — I428 Other cardiomyopathies: Secondary | ICD-10-CM | POA: Diagnosis not present

## 2023-01-28 LAB — CUP PACEART REMOTE DEVICE CHECK
Battery Remaining Longevity: 18 mo
Battery Remaining Percentage: 23 %
Brady Statistic RA Percent Paced: 0 %
Brady Statistic RV Percent Paced: 63 %
Date Time Interrogation Session: 20241025041100
HighPow Impedance: 46 Ohm
Implantable Lead Connection Status: 753985
Implantable Lead Connection Status: 753985
Implantable Lead Connection Status: 753985
Implantable Lead Implant Date: 20111201
Implantable Lead Implant Date: 20111201
Implantable Lead Implant Date: 20111201
Implantable Lead Location: 753858
Implantable Lead Location: 753859
Implantable Lead Location: 753860
Implantable Lead Model: 158
Implantable Lead Model: 4396
Implantable Lead Model: 5076
Implantable Lead Serial Number: 302703
Implantable Pulse Generator Implant Date: 20160817
Lead Channel Impedance Value: 595 Ohm
Lead Channel Impedance Value: 699 Ohm
Lead Channel Impedance Value: 975 Ohm
Lead Channel Pacing Threshold Amplitude: 0.9 V
Lead Channel Pacing Threshold Amplitude: 1.1 V
Lead Channel Pacing Threshold Amplitude: 1.8 V
Lead Channel Pacing Threshold Pulse Width: 0.4 ms
Lead Channel Pacing Threshold Pulse Width: 0.4 ms
Lead Channel Pacing Threshold Pulse Width: 1.5 ms
Lead Channel Setting Pacing Amplitude: 2.2 V
Lead Channel Setting Pacing Amplitude: 2.4 V
Lead Channel Setting Pacing Amplitude: 2.4 V
Lead Channel Setting Pacing Pulse Width: 0.4 ms
Lead Channel Setting Pacing Pulse Width: 1.5 ms
Lead Channel Setting Sensing Sensitivity: 0.5 mV
Lead Channel Setting Sensing Sensitivity: 1 mV
Pulse Gen Serial Number: 113381

## 2023-02-14 NOTE — Progress Notes (Signed)
Remote ICD transmission.   

## 2023-02-22 ENCOUNTER — Other Ambulatory Visit (HOSPITAL_BASED_OUTPATIENT_CLINIC_OR_DEPARTMENT_OTHER): Payer: Self-pay

## 2023-02-22 ENCOUNTER — Other Ambulatory Visit: Payer: Self-pay

## 2023-02-24 ENCOUNTER — Other Ambulatory Visit: Payer: Self-pay | Admitting: Cardiology

## 2023-02-24 ENCOUNTER — Other Ambulatory Visit: Payer: Self-pay

## 2023-02-24 ENCOUNTER — Other Ambulatory Visit (HOSPITAL_BASED_OUTPATIENT_CLINIC_OR_DEPARTMENT_OTHER): Payer: Self-pay

## 2023-02-25 ENCOUNTER — Other Ambulatory Visit: Payer: Self-pay

## 2023-02-25 ENCOUNTER — Encounter: Payer: Self-pay | Admitting: Cardiovascular Disease

## 2023-02-25 ENCOUNTER — Other Ambulatory Visit (HOSPITAL_BASED_OUTPATIENT_CLINIC_OR_DEPARTMENT_OTHER): Payer: Self-pay

## 2023-02-25 ENCOUNTER — Ambulatory Visit: Payer: Medicare Other | Attending: Cardiovascular Disease | Admitting: Cardiovascular Disease

## 2023-02-25 DIAGNOSIS — Z952 Presence of prosthetic heart valve: Secondary | ICD-10-CM

## 2023-02-25 DIAGNOSIS — G4733 Obstructive sleep apnea (adult) (pediatric): Secondary | ICD-10-CM

## 2023-02-25 DIAGNOSIS — I10 Essential (primary) hypertension: Secondary | ICD-10-CM | POA: Diagnosis not present

## 2023-02-25 DIAGNOSIS — I872 Venous insufficiency (chronic) (peripheral): Secondary | ICD-10-CM

## 2023-02-25 DIAGNOSIS — I7121 Aneurysm of the ascending aorta, without rupture: Secondary | ICD-10-CM

## 2023-02-25 DIAGNOSIS — I4819 Other persistent atrial fibrillation: Secondary | ICD-10-CM | POA: Diagnosis not present

## 2023-02-25 DIAGNOSIS — Z95810 Presence of automatic (implantable) cardiac defibrillator: Secondary | ICD-10-CM

## 2023-02-25 MED ORDER — FUROSEMIDE 40 MG PO TABS
ORAL_TABLET | ORAL | 2 refills | Status: DC
  Filled 2023-02-25: qty 90, 45d supply, fill #0

## 2023-02-25 NOTE — Progress Notes (Signed)
Cardiology Office Note    Date:  03/06/2023   ID:  Doniven, [name], MRN [id number]  PCP:  Gaspar Garbe, MD  Cardiologist:  Nicki Guadalajara, MD (sleep); Dr. Jens Som EP: Dr. Graciela Husbands  New sleep consult referred by Dr. Wylene Simmer for reevaluation of sleep apnea and new device.   History of Present Illness:  [name] is a 82 y.o. male cardiology patient of Dr. Jens Som who has a history of bicuspid aortic valve and underwent porcine aortic valve replacement in 2009 as well as a Cox-Maze procedure for atrial fibrillation.  He has history of nonischemic cardiomyopathy with initial EF prior to surgery at 20% which subsequently improved to 45 to 50%.  He subsequently underwent aortic valve replacement with 25 mm Carpentier Edwards valve replacement and also had undergone CRT-D by Dr. Graciela Husbands.  His last echo Doppler study in September 2023 showed EF 50 to 55% with apical anteroseptal akinesis.  He had moderate left atrial dilatation with mild right atrial dilatation.  There was no aortic stenosis with a 25 mm Edwards bioprosthetic valve present in the aortic position.  He is followed by Dr. Wylene Simmer for primary care.  He has a history of obstructive sleep apnea and apparently had undergone initial sleep evaluation at [facility] in 2008 and was started on CPAP therapy.  His machine is very old and he is now referred by Dr. Wylene Simmer for sleep evaluation and new device.  Presently, he sleeps in a recliner due primarily to sinus drainage.  He goes to bed around 9 PM and typically wakes up around 1 and 2 AM.  Once he is up he gets out of bed and typically may spend 1 to 2 hours on a computer and then goes back to bed between 3 and 4 AM and sleeps for short duration thereafter.  He often takes afternoon naps and falls asleep while watching television.  He has a history of atrial fibrillation and is status post pacemaker insertion followed by Dr. Graciela Husbands.  He remotely had  been on Tikosyn but is off Tikosyn and apparently will not take anticoagulation.  He is on carvedilol 12.5 mg twice a day, lisinopril 40 mg daily, spironolactone 25 mg for hypertension and also is on Farxiga 10 mg daily in addition to furosemide 40 mg.  He presents for sleep consultation and evaluation.   Past Medical History:  Diagnosis Date   Aortic stenosis/ bicuspid valve--s/p AVR    Ascending aortic aneurysm (HCC)    Atrial fibrillation/flutter    Biventricular implantable cardiac defibrillator- BSX    Cholelithiases    Diabetes mellitus without complication (HCC)    DJD (degenerative joint disease)    Hypertension    LBBB (left bundle branch block)    Non-ischemic cardiomyopathy (HCC)    OSA (obstructive sleep apnea)     Past Surgical History:  Procedure Laterality Date   AORTIC VALVE REPLACEMENT     APPENDECTOMY     CARDIOVERSION N/A 05/18/2021   Procedure: CARDIOVERSION;  Surgeon: Chilton Si, MD;  Location: [facility] ENDOSCOPY;  Service: Cardiovascular;  Laterality: N/A;   CERVICAL DISCECTOMY  5/01   C7   CHOLECYSTECTOMY     EP IMPLANTABLE DEVICE N/A 11/20/2014   Procedure:  ICD Generator Changeout;  Surgeon: Duke Salvia, MD;  Location: [facility] INVASIVE CV LAB;  Service: Cardiovascular;  Laterality: N/A;   MEDIAN STERNOTOMY     TONSILLECTOMY      Current  Medications: Outpatient Medications Prior to Visit  Medication Sig Dispense Refill   albuterol (VENTOLIN HFA) 108 (90 Base) MCG/ACT inhaler Inhale 2 puffs into the lungs every 6 (six) hours as needed for wheezing or shortness of breath. 18 g 1   aspirin 81 MG chewable tablet Chew 81 mg by mouth daily.     carvedilol (COREG) 12.5 MG tablet Take 1 tablet (12.5 mg total) by mouth 2 (two) times daily with a meal. 180 tablet 2   dapagliflozin propanediol (FARXIGA) 10 MG TABS tablet Take 1 tablet (10 mg total) by mouth daily. 90 tablet 2   lisinopril (ZESTRIL) 40 MG tablet Take 1 tablet (40 mg total) by mouth daily. 90 tablet 3    Semaglutide, 1 MG/DOSE, (OZEMPIC, 1 MG/DOSE,) 4 MG/3ML SOPN Inject 1 mg into the skin once a week. 3 mL 5   spironolactone (ALDACTONE) 25 MG tablet TAKE 1 TABLET BY MOUTH ONCE DAILY 90 tablet 2   furosemide (LASIX) 40 MG tablet Take 1 tablet by mouth once daily. Take an extra tablet as needed for swelling or shortness of breath (Patient taking differently: Take 40 mg by mouth daily.) 90 tablet 1   glucose blood (ONETOUCH VERIO) test strip Use to test blood sugar once a day. 100 each 5   No facility-administered medications prior to visit.     Allergies:   Patient has no known allergies.   Social History   Socioeconomic History   Marital status: Married    Spouse name: Not on file   Number of children: 2   Years of education: Not on file   Highest education level: Not on file  Occupational History   Occupation: retired Art gallery manager  Tobacco Use   Smoking status: Former   Smokeless tobacco: Never  Advertising account planner   Vaping status: Never Used  Substance and Sexual Activity   Alcohol use: No   Drug use: No   Sexual activity: Not on file  Other Topics Concern   Not on file  Social History Narrative   Not on file   Social Determinants of Health   Financial Resource Strain: Not on file  Food Insecurity: No Food Insecurity (12/13/2022)   Hunger Vital Sign    Worried About Running Out of Food in the Last Year: Never true    Ran Out of Food in the Last Year: Never true  Transportation Needs: No Transportation Needs (12/13/2022)   PRAPARE - Administrator, Civil Service (Medical): No    Lack of Transportation (Non-Medical): No  Physical Activity: Not on file  Stress: Not on file  Social Connections: Not on file    Socially he is married for 39 years.  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  He has 2 daughters.  Family History:  The patient's family history includes Alzheimer's disease in his brother; Congestive Heart Failure in his  sister; Diabetes in his brother; Emphysema in his father; Hypertension in his maternal grandmother; Leukemia in his mother; Stroke in his maternal grandmother.   ROS General: Negative; No fevers, chills, or night sweats;  HEENT: Negative; No changes in vision or hearing, sinus congestion, difficulty swallowing Pulmonary: Negative; No cough, wheezing, shortness of breath, hemoptysis Cardiovascular: History of bicuspid aortic valve, status post AVR, status post pacemaker; atrial fibrillation, currently off Tikosyn and will not take anticoagulation GI: Negative; No nausea, vomiting, diarrhea, or abdominal pain GU: Negative; No dysuria, hematuria, or difficulty voiding Musculoskeletal: Negative; no  myalgias, joint pain, or weakness Hematologic/Oncology: Negative; no easy bruising, bleeding Endocrine: Diabetes mellitus Neuro: Negative; no changes in balance, headaches Skin: Negative; No rashes or skin lesions Psychiatric: Negative; No behavioral problems, depression Sleep: History of obstructive sleep apnea initially diagnosed in 2008.  Previously on CPAP.  Cannot sleep in bed but sleeps in the recliner.  Loud snoring when sleeps in bed which is significantly improved in the recliner.  Excessive daytime sleepiness.  Epworth scale calculated at 13.  Other comprehensive 14 point system review is negative.   PHYSICAL EXAM:   VS:  BP 112/60 (BP Location: Left Arm, Patient Position: Sitting, Cuff Size: Normal)   Pulse (!) 51   Ht 5\' 10"  (1.778 m)   Wt 196 lb (88.9 kg)   SpO2 95%   BMI 28.12 kg/m     Repeat blood pressure by me was 122/70.  Wt Readings from Last 3 Encounters:  02/25/23 196 lb (88.9 kg)  01/05/23 196 lb (88.9 kg)  12/16/22 200 lb 8 oz (90.9 kg)    General: Alert, oriented, no distress.  Skin: normal turgor, no rashes, warm and dry HEENT: Normocephalic, atraumatic. Pupils equal round and reactive to light; sclera anicteric; extraocular muscles intact;  Nose without nasal  septal hypertrophy Mouth/Parynx benign; Mallinpatti scale 3/4 Neck: No JVD, no carotid bruits; normal carotid upstroke Lungs: Sinus drainage Chest wall: without tenderness to palpitation Heart: PMI not displaced, RRR, s1 s2 normal, 1/6 systolic murmur, no diastolic murmur, no rubs, gallops, thrills, or heaves Abdomen: Diastases recti soft, nontender; no hepatosplenomehaly, BS+; abdominal aorta nontender and not dilated by palpation. Back: no CVA tenderness Pulses 2+ Musculoskeletal: full range of motion, normal strength, no joint deformities Extremities: Venous stasis changes; sore left lateral distal leg;  no clubbing cyanosis, Homan's sign negative  Neurologic: grossly nonfocal; Cranial nerves grossly wnl Psychologic: Normal mood and affect   Studies/Labs Reviewed:   EKG Interpretation Date/Time:  Friday February 25 2023 08:08:09 EST Ventricular Rate:  78 PR Interval:    QRS Duration:  196 QT Interval:  454 QTC Calculation: 517 R Axis:   -35  Text Interpretation: Ventricular-paced rhythm Biventricular pacemaker detected When compared with ECG of 25-Feb-2023 08:07, Vent. rate has increased BY   2 BPM Confirmed by Nicki Guadalajara ([id number]) on 03/06/2023 10:49:49 PM    Recent Labs:    Latest Ref Rng & Units 12/16/2022    2:53 AM 12/14/2022    2:32 AM 12/13/2022    9:13 PM  BMP  Glucose 70 - 99 mg/dL 604  540    BUN 8 - 23 mg/dL 32  38    Creatinine 9.81 - 1.24 mg/dL 1.91  4.78  2.95   Sodium 135 - 145 mmol/L 134  135    Potassium 3.5 - 5.1 mmol/L 3.9  3.6    Chloride 98 - 111 mmol/L 101  102    CO2 22 - 32 mmol/L 24  23    Calcium 8.9 - 10.3 mg/dL 8.6  8.1          Latest Ref Rng & Units 12/14/2022    2:32 AM 12/13/2022   12:32 PM 09/18/2007   10:00 AM  Hepatic Function  Total Protein 6.5 - 8.1 g/dL 5.0  6.0  6.3   Albumin 3.5 - 5.0 g/dL 2.8  3.5  3.4   AST 15 - 41 U/L 32  32  14   ALT 0 - 44 U/L 39  43  13   Alk Phosphatase 38 -  126 U/L 58  77  75   Total Bilirubin 0.3 -  1.2 mg/dL 1.4  2.2  1.2        Latest Ref Rng & Units 12/16/2022    2:53 AM 12/14/2022    2:32 AM 12/13/2022    9:13 PM  CBC  WBC 4.0 - 10.5 K/uL 7.6  9.6  12.9   Hemoglobin 13.0 - 17.0 g/dL 16.1  09.6  04.5   Hematocrit 39.0 - 52.0 % 36.9  34.5  37.3   Platelets 150 - 400 K/uL 138  151  153    Lab Results  Component Value Date   MCV 88.3 12/16/2022   MCV 88.5 12/14/2022   MCV 88.4 12/13/2022   Lab Results  Component Value Date   TSH 3.535 Test methodology is 3rd generation TSH 09/18/2007   Lab Results  Component Value Date   HGBA1C 7.4 (H) 12/13/2022     BNP No results found for: "BNP"  ProBNP    Component Value Date/Time   PROBNP 120.4 (H) 12/24/2009 0832     Lipid Panel  No results found for: "CHOL", "TRIG", "HDL", "CHOLHDL", "VLDL", "LDLCALC", "LDLDIRECT", "LABVLDL"   RADIOLOGY: No results found.   Additional studies/ records that were reviewed today include:  Records of Dr. Jens Som and Dr. Graciela Husbands were reviewed.  Records of Dr. Wylene Simmer were reviewed.   ASSESSMENT:    1. SLEEP APNEA, OBSTRUCTIVE   2. Essential hypertension   3. S/P AVR   4. Persistent atrial fibrillation (HCC)   5. Biventricular implantable cardioverter-defibrillator in situ   6. Essential hypertension   7. Aneurysm of ascending aorta without rupture (HCC)   8. Venous stasis dermatitis of both lower extremities     PLAN:  Mr. Blessing Personius is an 82 year old male who has a history of bicuspid aortic valve and underwent porcine aortic valve replacement in 2000 and on 9.  He has a history of nonischemic cardiomyopathy and had undergone Cox-Maze procedure for atrial fibrillation at time of surgery.  He subsequently had a 25 mm Carpentier Edwards valve replacement and replacement of ascending aortic aneurysm with Hemashield graft.  He has history of recurrent A-fib and underwent CRT-D in 2011 and had previously been placed on Tikosyn for atrial fibrillation/flutter.  Patient is followed by  Dr. Graciela Husbands and is no longer on Tikosyn and has refused anticoagulation.  He was originally diagnosed with sleep apnea in 2008 and has been on CPAP therapy.  His device is very old.  Presently, he is unable to sleep lying down and sleeps in a recliner and has significant sinus drainage.  His sleep duration is suboptimal and he typically goes to bed at 9 PM but wakes up between 1 and 2 AM.  He then gets out of bed and goes on a computer for 1 to 2 hours before trying to go back to bed between 3 and 4 AM.  He only then sleeps for short duration thereafter but often takes afternoon naps due to residual daytime sleepiness.  I discussed with him the importance of CPAP therapy particularly with reference to his cardiovascular health.  I discussed implications regarding untreated sleep apnea affecting blood pressure, increase potential for nocturnal arrhythmias including atrial fibrillation, as well as negative effects on insulin resistance, increased inflammatory markers as well as nocturnal GERD.  In addition I discussed potential for sleep apnea induced hypoxia contributing to nocturnal ischemia in both the cardiac as well as cerebrovascular circulation.  Presently, I have recommended reassessment  of his sleep apnea and we will schedule him for an in-lab split-night sleep study with diagnostic/ CPAP or BiPAP titration to follow.  I discussed optimal sleep duration at 7 and 9 hours.  His current sleep hygiene is very poor and disruptive leading to significant residual daytime sleepiness and hide likelihood of inadequate REM sleep.  I will see him in approximately 3-4 months for follow-up evaluation   Medication Adjustments/Labs and Tests Ordered: Current medicines are reviewed at length with the patient today.  Concerns regarding medicines are outlined above.  Medication changes, Labs and Tests ordered today are listed in the Patient Instructions below. Patient Instructions  Medication Instructions:  No medication  changes were made during today's visit   *If you need a refill on your cardiac medications before your next appointment, please call your pharmacy*   Lab Work: No labs were order during today's visit.  If you have labs (blood work) drawn today and your tests are completely normal, you will receive your results only by: MyChart Message (if you have MyChart) OR A paper copy in the mail If you have any lab test that is abnormal or we need to change your treatment, we will call you to review the results.   Testing/Procedures: Your physician has recommended that you have a sleep study. This test records several body functions during sleep, including: brain activity, eye movement, oxygen and carbon dioxide blood levels, heart rate and rhythm, breathing rate and rhythm, the flow of air through your mouth and nose, snoring, body muscle movements, and chest and belly movement.    Follow-Up: At [facility], you and your health needs are our priority.  As part of our continuing mission to provide you with exceptional heart care, we have created designated Provider Care Teams.  These Care Teams include your primary Cardiologist (physician) and Advanced Practice Providers (APPs -  Physician Assistants and Nurse Practitioners) who all work together to provide you with the care you need, when you need it.  We recommend signing up for the patient portal called "MyChart".  Sign up information is provided on this After Visit Summary.  MyChart is used to connect with patients for Virtual Visits (Telemedicine).  Patients are able to view lab/test results, encounter notes, upcoming appointments, etc.  Non-urgent messages can be sent to your provider as well.   To learn more about what you can do with MyChart, go to [email or web address].    Your next appointment:   4 month(s)  Provider:   Dr. Nicki Guadalajara       Signed, Nicki Guadalajara, MD,FACC, ABSM Diplomate, American Board of Sleep  Medicine    03/06/2023 11:14 PM    [facility] [address], [place], [place]  [address] Phone: [phone]

## 2023-02-25 NOTE — Patient Instructions (Signed)
Medication Instructions:  No medication changes were made during today's visit   *If you need a refill on your cardiac medications before your next appointment, please call your pharmacy*   Lab Work: No labs were order during today's visit.  If you have labs (blood work) drawn today and your tests are completely normal, you will receive your results only by: MyChart Message (if you have MyChart) OR A paper copy in the mail If you have any lab test that is abnormal or we need to change your treatment, we will call you to review the results.   Testing/Procedures: Your physician has recommended that you have a sleep study. This test records several body functions during sleep, including: brain activity, eye movement, oxygen and carbon dioxide blood levels, heart rate and rhythm, breathing rate and rhythm, the flow of air through your mouth and nose, snoring, body muscle movements, and chest and belly movement.    Follow-Up: At [facility], you and your health needs are our priority.  As part of our continuing mission to provide you with exceptional heart care, we have created designated Provider Care Teams.  These Care Teams include your primary Cardiologist (physician) and Advanced Practice Providers (APPs -  Physician Assistants and Nurse Practitioners) who all work together to provide you with the care you need, when you need it.  We recommend signing up for the patient portal called "MyChart".  Sign up information is provided on this After Visit Summary.  MyChart is used to connect with patients for Virtual Visits (Telemedicine).  Patients are able to view lab/test results, encounter notes, upcoming appointments, etc.  Non-urgent messages can be sent to your provider as well.   To learn more about what you can do with MyChart, go to [email or web address].    Your next appointment:   4 month(s)  Provider:   Dr. Nicki Guadalajara

## 2023-03-04 ENCOUNTER — Ambulatory Visit
Admission: EM | Admit: 2023-03-04 | Discharge: 2023-03-04 | Disposition: A | Discharge status: Home / Self Care | Payer: Medicare Other | Attending: Family Medicine | Admitting: Family Medicine

## 2023-03-04 ENCOUNTER — Other Ambulatory Visit (HOSPITAL_BASED_OUTPATIENT_CLINIC_OR_DEPARTMENT_OTHER): Payer: Self-pay

## 2023-03-04 ENCOUNTER — Other Ambulatory Visit: Payer: Self-pay

## 2023-03-04 DIAGNOSIS — L089 Local infection of the skin and subcutaneous tissue, unspecified: Secondary | ICD-10-CM

## 2023-03-04 DIAGNOSIS — T148XXA Other injury of unspecified body region, initial encounter: Secondary | ICD-10-CM | POA: Diagnosis not present

## 2023-03-04 MED ORDER — DOXYCYCLINE HYCLATE 100 MG PO CAPS
100.0000 mg | ORAL_CAPSULE | Freq: Two times a day (BID) | ORAL | 0 refills | Status: AC
  Filled 2023-03-04: qty 14, 7d supply, fill #0

## 2023-03-04 MED ORDER — MUPIROCIN 2 % EX OINT
1.0000 | TOPICAL_OINTMENT | Freq: Two times a day (BID) | CUTANEOUS | 0 refills | Status: DC
  Filled 2023-03-04: qty 22, 11d supply, fill #0

## 2023-03-04 NOTE — Discharge Instructions (Signed)
Take doxycycline 100 mg --1 capsule 2 times daily for 7 days  Put mupirocin ointment on the sore areas twice daily until improved  Wash your wound 2 times a day with soapy water and apply new antibiotic ointment and a clean bandage.  Once it is healing better then you can drop down to doing this just once a day.

## 2023-03-04 NOTE — ED Triage Notes (Signed)
Pt presents to UC w/ c/o wound on left lower leg x2 weeks. Scraped with a piece of wood. Cleaning it with neosporin and nonstick pad. Hx diabetes

## 2023-03-04 NOTE — ED Provider Notes (Signed)
[facility]    CSN: [id number] Arrival date & time: 03/04/23  0802      History   Chief Complaint No chief complaint on file.   HPI [name] is a 82 y.o. male.   HPI Here for a sore on his left leg. About 2 weeks ago he scraped it on a piece of wood and it has not been healing.  He will see a little yellow drainage on pad.  No fever or chills.  He does have a history of diabetes, last A1c was 7.4 in September.  No allergies to medications  Past Medical History:  Diagnosis Date   Aortic stenosis/ bicuspid valve--s/p AVR    Ascending aortic aneurysm (HCC)    Atrial fibrillation/flutter    Biventricular implantable cardiac defibrillator- BSX    Cholelithiases    Diabetes mellitus without complication (HCC)    DJD (degenerative joint disease)    Hypertension    LBBB (left bundle branch block)    Non-ischemic cardiomyopathy (HCC)    OSA (obstructive sleep apnea)     Patient Active Problem List   Diagnosis Date Noted   Pressure injury of left buttock, stage 1 12/20/2022   Sepsis due to Escherichia coli (HCC) 12/20/2022   Sepsis (HCC) 12/13/2022   PNA (pneumonia) 12/13/2022   UTI (urinary tract infection) 12/13/2022   LBBB (left bundle branch block) 06/11/2021   Typical atrial flutter (HCC)    Abdominal aortic aneurysm (HCC) 02/20/2014   Encounter for therapeutic drug monitoring 08/03/2013   Bruit 02/14/2013   Hypertension    Nonischemic cardiomyopathy (HCC) 02/03/2011   Biventricular implantable cardioverter-defibrillator in situ 08/04/2010   Atrial fibrillation (HCC) 06/09/2010   SYSTOLIC HEART FAILURE, CHRONIC 01/26/2010   Essential hypertension, benign 05/07/2009   AORTIC VALVE REPLACEMENT, HX OF 05/07/2009   SLEEP APNEA, OBSTRUCTIVE 05/06/2009   Aortic valve disorder 05/06/2009   Chronic combined systolic and diastolic heart failure (HCC) 05/06/2009   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/06/2009   Calculus of gallbladder 05/06/2009    Osteoarthritis 05/06/2009   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   SHORTNESS OF BREATH 05/06/2009    Past Surgical History:  Procedure Laterality Date   AORTIC VALVE REPLACEMENT     APPENDECTOMY     CARDIOVERSION N/A 05/18/2021   Procedure: CARDIOVERSION;  Surgeon: Chilton Si, MD;  Location: [facility] ENDOSCOPY;  Service: Cardiovascular;  Laterality: N/A;   CERVICAL DISCECTOMY  5/01   C7   CHOLECYSTECTOMY     EP IMPLANTABLE DEVICE N/A 11/20/2014   Procedure:  ICD Generator Changeout;  Surgeon: Duke Salvia, MD;  Location: [facility] INVASIVE CV LAB;  Service: Cardiovascular;  Laterality: N/A;   MEDIAN STERNOTOMY     TONSILLECTOMY         Home Medications    Prior to Admission medications   Medication Sig Start Date End Date Taking? Authorizing Provider  doxycycline (VIBRAMYCIN) 100 MG capsule Take 1 capsule (100 mg total) by mouth 2 (two) times daily for 7 days. 03/04/23 03/11/23 Yes Zenia Resides, MD  mupirocin ointment (BACTROBAN) 2 % Apply 1 Application topically 2 (two) times daily. To affected area till better 03/04/23  Yes Syncere Kaminski, Janace Aris, MD  albuterol (VENTOLIN HFA) 108 (90 Base) MCG/ACT inhaler Inhale 2 puffs into the lungs every 6 (six) hours as needed for wheezing or shortness of breath. 12/16/22   Pokhrel, Rebekah Chesterfield, MD  aspirin 81 MG chewable tablet Chew 81 mg by mouth daily.    [provider]  carvedilol (  COREG) 12.5 MG tablet Take 1 tablet (12.5 mg total) by mouth 2 (two) times daily with a meal. 01/25/23   Crenshaw, Madolyn Frieze, MD  dapagliflozin propanediol (FARXIGA) 10 MG TABS tablet Take 1 tablet (10 mg total) by mouth daily. 11/26/22     furosemide (LASIX) 40 MG tablet Take 1 tablet by mouth once daily. Take an extra tablet as needed for swelling or shortness of breath 02/25/23   Duke Salvia, MD  glucose blood [facility] VERIO) test strip Use to test blood sugar once a day. 10/21/22     lisinopril (ZESTRIL) 40 MG tablet Take 1 tablet (40 mg total) by mouth daily.  11/26/22     Semaglutide, 1 MG/DOSE, (OZEMPIC, 1 MG/DOSE,) 4 MG/3ML SOPN Inject 1 mg into the skin once a week. 12/03/22     spironolactone (ALDACTONE) 25 MG tablet TAKE 1 TABLET BY MOUTH ONCE DAILY 08/20/22 08/20/23  Duke Salvia, MD    Family History Family History  Problem Relation Age of Onset   Emphysema Father    Leukemia Mother    Hypertension Maternal Grandmother    Stroke Maternal Grandmother    Congestive Heart Failure Sister    Diabetes Brother    Alzheimer's disease Brother     Social History Social History   Tobacco Use   Smoking status: Former   Smokeless tobacco: Never  Advertising account planner   Vaping status: Never Used  Substance Use Topics   Alcohol use: No   Drug use: No     Allergies   Patient has no known allergies.   Review of Systems Review of Systems   Physical Exam Triage Vital Signs ED Triage Vitals  Encounter Vitals Group     BP 03/04/23 0818 118/69     Systolic BP Percentile --      Diastolic BP Percentile --      Pulse Rate 03/04/23 0818 78     Resp 03/04/23 0818 16     Temp 03/04/23 0822 (!) 97.2 F (36.2 C)     Temp Source 03/04/23 0822 Temporal     SpO2 03/04/23 0818 96 %     Weight --      Height --      Head Circumference --      Peak Flow --      Pain Score 03/04/23 0817 0     Pain Loc --      Pain Education --      Exclude from Growth Chart --    No data found.  Updated Vital Signs BP 118/69 (BP Location: Right Arm)   Pulse 78   Temp (!) 97.2 F (36.2 C) (Temporal)   Resp 16   SpO2 96%   Visual Acuity Right Eye Distance:   Left Eye Distance:   Bilateral Distance:    Right Eye Near:   Left Eye Near:    Bilateral Near:     Physical Exam Vitals reviewed.  Constitutional:      General: He is not in acute distress.    Appearance: He is not ill-appearing, toxic-appearing or diaphoretic.  Musculoskeletal:     Comments: There is an area of mild pink erythema about 6 cm x 5 cm on his anterolateral lower leg about 5 cm  from the lateral malleolus.  In the middle portion there is an area that is open with little bit of yellow drainage in the center.  It is about 2.5 x 1.5 cm.  There is some mild tenderness  at the area  Skin:    Coloration: Skin is not jaundiced or pale.  Neurological:     Mental Status: He is alert and oriented to person, place, and time.  Psychiatric:        Behavior: Behavior normal.      UC Treatments / Results  Labs (all labs ordered are listed, but only abnormal results are displayed) Labs Reviewed - No data to display  EKG   Radiology No results found.  Procedures Procedures (including critical care time)  Medications Ordered in UC Medications - No data to display  Initial Impression / Assessment and Plan / UC Course  I have reviewed the triage vital signs and the nursing notes.  Pertinent labs & imaging results that were available during my care of the patient were reviewed by me and considered in my medical decision making (see chart for details).     Mupirocin is sent in to treat topically and doxycycline is sent to take twice a day for 7 days to treat the wound infection. Dressing is applied here in the clinic Final Clinical Impressions(s) / UC Diagnoses   Final diagnoses:  Post-traumatic wound infection     Discharge Instructions      Take doxycycline 100 mg --1 capsule 2 times daily for 7 days  Put mupirocin ointment on the sore areas twice daily until improved  Wash your wound 2 times a day with soapy water and apply new antibiotic ointment and a clean bandage.  Once it is healing better then you can drop down to doing this just once a day.        ED Prescriptions     Medication Sig Dispense Auth. Provider   mupirocin ointment (BACTROBAN) 2 % Apply 1 Application topically 2 (two) times daily. To affected area till better 22 g Zenia Resides, MD   doxycycline (VIBRAMYCIN) 100 MG capsule Take 1 capsule (100 mg total) by mouth 2 (two) times  daily for 7 days. 14 capsule Marlinda Mike, Janace Aris, MD      PDMP not reviewed this encounter.   Zenia Resides, MD 03/04/23 [phone]

## 2023-03-06 ENCOUNTER — Encounter: Payer: Self-pay | Admitting: Cardiovascular Disease

## 2023-03-09 ENCOUNTER — Ambulatory Visit: Payer: Medicare Other | Admitting: Cardiology

## 2023-03-09 ENCOUNTER — Other Ambulatory Visit (HOSPITAL_BASED_OUTPATIENT_CLINIC_OR_DEPARTMENT_OTHER): Payer: Self-pay

## 2023-03-21 ENCOUNTER — Other Ambulatory Visit (HOSPITAL_BASED_OUTPATIENT_CLINIC_OR_DEPARTMENT_OTHER): Payer: Self-pay

## 2023-04-01 NOTE — Progress Notes (Signed)
 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 Note a cardiac catheterization in April of 2009 prior to his procedure showed an ejection fraction of 20%, no coronary disease, dilated aortic root, moderate aortic stenosis and mild mitral regurgitation. The patient subsequently had aortic valve replacement with 25 mm Carpentier-Edwards valve, replacement of ascending aortic aneurysm with 38-mm Hem[place]ield graft, modified Cox maze III. Note he did develop atrial fibrillation 6 months after his procedure. A cardioversion was attempted by his report but he only held sinus for one week. Patient had CRT-D 2011. He was placed on tikosyn  for his atrial fibrillation/flutter. His dose was decreased because of increasing QT. Echocardiogram September 2023 showed normal LV function, mild left ventricular hypertrophy, akinesis of the apex and anteroseptal wall, moderate left atrial enlargement, mild right atrial enlargement, status post aortic valve replacement with no aortic insufficiency or aortic stenosis.  CTA September 2024 showed no pulmonary embolus but no aortic aneurysm.  Since I last saw him, he denies dyspnea, chest pain, palpitations or syncope.  Current Outpatient Medications  Medication Sig Dispense Refill   albuterol  (VENTOLIN  HFA) 108 (90 Base) MCG/ACT inhaler Inhale 2 puffs into the lungs every 6 (six) hours as needed for wheezing or shortness of breath. 18 g 1   allopurinol  (ZYLOPRIM ) 100 MG tablet Take 1 tablet (100 mg total) by mouth daily. 30 tablet 3   aspirin  81 MG chewable tablet Chew 81 mg by mouth daily.     carvedilol  (COREG ) 12.5 MG tablet Take 1 tablet (12.5 mg total) by mouth 2 (two) times daily with a meal. 180 tablet 2   dapagliflozin  propanediol (FARXIGA ) 10 MG TABS tablet Take 1 tablet (10 mg total) by mouth daily. 90 tablet 2   furosemide  (LASIX ) 40 MG tablet Take 1  tablet by mouth once daily. Take an extra tablet as needed for swelling or shortness of breath 90 tablet 2   glucose blood (ONETOUCH VERIO) test strip Use to test blood sugar once a day. 100 each 5   lisinopril  (ZESTRIL ) 40 MG tablet Take 1 tablet (40 mg total) by mouth daily. 90 tablet 3   mupirocin  ointment (BACTROBAN ) 2 % Apply 1 Application topically 2 (two) times daily. To affected area til better 22 g 0   Semaglutide , 1 MG/DOSE, (OZEMPIC , 1 MG/DOSE,) 4 MG/3ML SOPN Inject 1 mg into the skin once a week. 3 mL 5   Semaglutide , 2 MG/DOSE, (OZEMPIC , 2 MG/DOSE,) 8 MG/3ML SOPN Inject 2 mg into the skin once a week. 3 mL 11   spironolactone  (ALDACTONE ) 25 MG tablet TAKE 1 TABLET BY MOUTH ONCE DAILY 90 tablet 2   No current facility-administered medications for this visit.     Past Medical History:  Diagnosis Date   Aortic stenosis/ bicuspid valve--s/p AVR    Ascending aortic aneurysm (HCC)    Atrial fibrillation/flutter    Biventricular implantable cardiac defibrillator- BSX    Cholelithiases    Diabetes mellitus without complication (HCC)    DJD (degenerative joint disease)    Hypertension    LBBB (left bundle branch block)    Non-ischemic cardiomyopathy (HCC)    OSA (obstructive sleep apnea)     Past Surgical History:  Procedure Laterality Date   AORTIC VALVE REPLACEMENT     APPENDECTOMY     CARDIOVERSION N/A 05/18/2021   Procedure: CARDIOVERSION;  Surgeon: Raford Riggs, MD;  Location: [facility]  ENDOSCOPY;  Service: Cardiovascular;  Laterality: N/A;   CERVICAL DISCECTOMY  5/01   C7   CHOLECYSTECTOMY     EP IMPLANTABLE DEVICE N/A 11/20/2014   Procedure:  ICD Generator Changeout;  Surgeon: Elspeth JAYSON Sage, MD;  Location: [facility] INVASIVE CV LAB;  Service: Cardiovascular;  Laterality: N/A;   MEDIAN STERNOTOMY     TONSILLECTOMY      Social History   Socioeconomic History   Marital status: Married    Spouse name: Not on file   Number of children: 2   Years of education: Not on file    Highest education level: Not on file  Occupational History   Occupation: retired art gallery manager  Tobacco Use   Smoking status: Former   Smokeless tobacco: Never  Advertising Account Planner   Vaping status: Never Used  Substance and Sexual Activity   Alcohol use: No   Drug use: No   Sexual activity: Not on file  Other Topics Concern   Not on file  Social History Narrative   Not on file   Social Drivers of Health   Financial Resource Strain: Not on file  Food Insecurity: No Food Insecurity (12/13/2022)   Hunger Vital Sign    Worried About Running Out of Food in the Last Year: Never true    Ran Out of Food in the Last Year: Never true  Transportation Needs: No Transportation Needs (12/13/2022)   PRAPARE - Administrator, Civil Service (Medical): No    Lack of Transportation (Non-Medical): No  Physical Activity: Not on file  Stress: Not on file  Social Connections: Not on file  Intimate Partner Violence: Not on file    Family History  Problem Relation Age of Onset   Emphysema Father    Leukemia Mother    Hypertension Maternal Grandmother    Stroke Maternal Grandmother    Congestive Heart Failure Sister    Diabetes Brother    Alzheimer's disease Brother     ROS: no fevers or chills, productive cough, hemoptysis, dysphasia, odynophagia, melena, hematochezia, dysuria, hematuria, rash, seizure activity, orthopnea, PND, pedal edema, claudication. Remaining systems are negative.  Physical Exam: Well-developed well-nourished in no acute distress.  Skin is warm and dry.  HEENT is normal.  Neck is supple.  Chest is clear to auscultation with normal expansion.  Cardiovascular exam is regular rate and rhythm.  Abdominal exam nontender or distended. No masses palpated. Extremities show no edema. neuro grossly intact  A/P  1 paroxysmal atrial fibrillation-at last office visit with Dr. Sage he was noted to be in atrial flutter and Tikosyn  was discontinued.  Plan to continue present  dose of beta-blocker for rate control.  He declines anticoagulation and understands the high risk of CVA.  He did have modified Cox-Maze procedure previously.  2 status post aortic valve replacement-most recent echocardiogram showed normally functioning valve.  Continue SBE prophylaxis.  3 history of nonischemic cardiomyopathy-LV function low normal on most recent echocardiogram.  Will continue ACE inhibitor and beta-blocker.  4 hypertension-patient's blood pressure is controlled.  Continue present medical regimen.  Check potassium and renal function.  Will also check hemoglobin.  5 history of mildly dilated thoracic aorta-not evident on most recent CTA.  6 ICD-followed by electrophysiology.  7 obstructive sleep apnea-per Dr. Burnard.  Redell Shallow, MD

## 2023-04-07 ENCOUNTER — Other Ambulatory Visit (HOSPITAL_BASED_OUTPATIENT_CLINIC_OR_DEPARTMENT_OTHER): Payer: Self-pay

## 2023-04-07 MED ORDER — OZEMPIC (2 MG/DOSE) 8 MG/3ML ~~LOC~~ SOPN
2.0000 mg | PEN_INJECTOR | SUBCUTANEOUS | 11 refills | Status: AC
  Filled 2023-04-07 – 2023-04-29 (×3): qty 3, 28d supply, fill #0
  Filled 2023-06-06: qty 3, 28d supply, fill #1
  Filled 2023-07-08: qty 3, 28d supply, fill #2
  Filled 2023-08-04: qty 3, 28d supply, fill #3
  Filled 2023-09-07: qty 3, 28d supply, fill #4
  Filled 2023-10-05: qty 3, 28d supply, fill #5
  Filled 2023-10-30: qty 3, 28d supply, fill #6
  Filled 2023-12-04: qty 3, 28d supply, fill #7
  Filled 2024-01-02: qty 3, 28d supply, fill #8
  Filled 2024-02-13: qty 3, 28d supply, fill #9
  Filled 2024-03-21: qty 3, 28d supply, fill #10

## 2023-04-08 ENCOUNTER — Other Ambulatory Visit (HOSPITAL_BASED_OUTPATIENT_CLINIC_OR_DEPARTMENT_OTHER): Payer: Self-pay

## 2023-04-11 ENCOUNTER — Other Ambulatory Visit (HOSPITAL_BASED_OUTPATIENT_CLINIC_OR_DEPARTMENT_OTHER): Payer: Self-pay

## 2023-04-11 MED ORDER — ALLOPURINOL 100 MG PO TABS
100.0000 mg | ORAL_TABLET | Freq: Every day | ORAL | 3 refills | Status: DC
  Filled 2023-04-11: qty 30, 30d supply, fill #0
  Filled 2023-05-05 (×2): qty 30, 30d supply, fill #1
  Filled 2023-06-06: qty 30, 30d supply, fill #2
  Filled 2023-06-30: qty 30, 30d supply, fill #3

## 2023-04-13 ENCOUNTER — Encounter: Payer: Self-pay | Admitting: Cardiology

## 2023-04-13 ENCOUNTER — Ambulatory Visit: Payer: Medicare Other | Attending: Cardiology | Admitting: Cardiology

## 2023-04-13 VITALS — BP 122/80 | HR 79 | Ht 70.0 in | Wt 195.0 lb

## 2023-04-13 DIAGNOSIS — I428 Other cardiomyopathies: Secondary | ICD-10-CM | POA: Diagnosis not present

## 2023-04-13 DIAGNOSIS — Z952 Presence of prosthetic heart valve: Secondary | ICD-10-CM

## 2023-04-13 DIAGNOSIS — I4819 Other persistent atrial fibrillation: Secondary | ICD-10-CM | POA: Diagnosis not present

## 2023-04-13 DIAGNOSIS — I10 Essential (primary) hypertension: Secondary | ICD-10-CM

## 2023-04-13 NOTE — Patient Instructions (Signed)

## 2023-04-14 ENCOUNTER — Telehealth: Payer: Self-pay | Admitting: *Deleted

## 2023-04-14 DIAGNOSIS — I5042 Chronic combined systolic (congestive) and diastolic (congestive) heart failure: Secondary | ICD-10-CM

## 2023-04-14 LAB — CBC
Hematocrit: 43.8 % (ref 37.5–51.0)
Hemoglobin: 14.6 g/dL (ref 13.0–17.7)
MCH: 29.8 pg (ref 26.6–33.0)
MCHC: 33.3 g/dL (ref 31.5–35.7)
MCV: 89 fL (ref 79–97)
Platelets: 242 10*3/uL (ref 150–450)
RBC: 4.9 x10E6/uL (ref 4.14–5.80)
RDW: 14.1 % (ref 11.6–15.4)
WBC: 8.5 10*3/uL (ref 3.4–10.8)

## 2023-04-14 LAB — BASIC METABOLIC PANEL
BUN/Creatinine Ratio: 29 — ABNORMAL HIGH (ref 10–24)
BUN: 44 mg/dL — ABNORMAL HIGH (ref 8–27)
CO2: 25 mmol/L (ref 20–29)
Calcium: 9.5 mg/dL (ref 8.6–10.2)
Chloride: 104 mmol/L (ref 96–106)
Creatinine, Ser: 1.51 mg/dL — ABNORMAL HIGH (ref 0.76–1.27)
Glucose: 163 mg/dL — ABNORMAL HIGH (ref 70–99)
Potassium: 4.9 mmol/L (ref 3.5–5.2)
Sodium: 144 mmol/L (ref 134–144)
eGFR: 46 mL/min/{1.73_m2} — ABNORMAL LOW (ref >59)

## 2023-04-14 MED ORDER — FUROSEMIDE 40 MG PO TABS: ORAL_TABLET | ORAL | Status: DC

## 2023-04-14 NOTE — Telephone Encounter (Signed)
Spoke with pt, Aware of dr crenshaw's recommendations.  Lab orders mailed to the pt  

## 2023-04-14 NOTE — Telephone Encounter (Signed)
-----   Message from Olga Millers sent at 04/14/2023  6:26 AM EST ----- Hold lasix for 2 days and then decrease to 20 mg daily; bmet one week Olga Millers

## 2023-04-21 LAB — BASIC METABOLIC PANEL
BUN/Creatinine Ratio: 34 — ABNORMAL HIGH (ref 10–24)
BUN: 39 mg/dL — ABNORMAL HIGH (ref 8–27)
CO2: 24 mmol/L (ref 20–29)
Calcium: 9.7 mg/dL (ref 8.6–10.2)
Chloride: 101 mmol/L (ref 96–106)
Creatinine, Ser: 1.15 mg/dL (ref 0.76–1.27)
Glucose: 135 mg/dL — ABNORMAL HIGH (ref 70–99)
Potassium: 5.3 mmol/L — ABNORMAL HIGH (ref 3.5–5.2)
Sodium: 138 mmol/L (ref 134–144)
eGFR: 63 mL/min/{1.73_m2} (ref >59)

## 2023-04-22 ENCOUNTER — Other Ambulatory Visit: Payer: Self-pay | Admitting: *Deleted

## 2023-04-22 ENCOUNTER — Other Ambulatory Visit (HOSPITAL_BASED_OUTPATIENT_CLINIC_OR_DEPARTMENT_OTHER): Payer: Self-pay

## 2023-04-22 ENCOUNTER — Telehealth: Payer: Self-pay | Admitting: Cardiology

## 2023-04-22 DIAGNOSIS — Z79899 Other long term (current) drug therapy: Secondary | ICD-10-CM

## 2023-04-22 MED ORDER — SPIRONOLACTONE 25 MG PO TABS
12.5000 mg | ORAL_TABLET | Freq: Every day | ORAL | 2 refills | Status: DC
  Filled 2023-04-22 – 2023-05-02 (×3): qty 45, 90d supply, fill #0
  Filled 2023-07-14: qty 45, 90d supply, fill #1
  Filled 2023-09-01: qty 30, 60d supply, fill #2
  Filled 2023-09-01: qty 45, 90d supply, fill #2
  Filled 2023-09-29: qty 15, 30d supply, fill #3

## 2023-04-22 NOTE — Telephone Encounter (Signed)
Patient identification verified by 2 forms. Marilynn Rail, RN    Called and spoke to patient  Informed patient:  -per Dr. Jens Som decrease spironolactone to 12.5mg  daily   -present to lab in 2 weeks for BMET  Patient verbalized understanding, no questions at this time

## 2023-04-22 NOTE — Telephone Encounter (Signed)
Returning call to nurse °

## 2023-04-27 ENCOUNTER — Other Ambulatory Visit (HOSPITAL_BASED_OUTPATIENT_CLINIC_OR_DEPARTMENT_OTHER): Payer: Self-pay

## 2023-04-28 ENCOUNTER — Other Ambulatory Visit (HOSPITAL_BASED_OUTPATIENT_CLINIC_OR_DEPARTMENT_OTHER): Payer: Self-pay

## 2023-04-29 ENCOUNTER — Other Ambulatory Visit (HOSPITAL_BASED_OUTPATIENT_CLINIC_OR_DEPARTMENT_OTHER): Payer: Self-pay

## 2023-05-02 ENCOUNTER — Other Ambulatory Visit (HOSPITAL_BASED_OUTPATIENT_CLINIC_OR_DEPARTMENT_OTHER): Payer: Self-pay

## 2023-05-03 ENCOUNTER — Other Ambulatory Visit (HOSPITAL_BASED_OUTPATIENT_CLINIC_OR_DEPARTMENT_OTHER): Payer: Self-pay

## 2023-05-05 ENCOUNTER — Other Ambulatory Visit (HOSPITAL_BASED_OUTPATIENT_CLINIC_OR_DEPARTMENT_OTHER): Payer: Self-pay

## 2023-05-05 ENCOUNTER — Ambulatory Visit (INDEPENDENT_AMBULATORY_CARE_PROVIDER_SITE_OTHER): Payer: Medicare Other

## 2023-05-05 ENCOUNTER — Other Ambulatory Visit: Payer: Self-pay

## 2023-05-05 DIAGNOSIS — I428 Other cardiomyopathies: Secondary | ICD-10-CM | POA: Diagnosis not present

## 2023-05-05 LAB — CUP PACEART REMOTE DEVICE CHECK
Battery Remaining Longevity: 10 mo
Battery Remaining Percentage: 14 %
Brady Statistic RA Percent Paced: 0 %
Brady Statistic RV Percent Paced: 52 %
Date Time Interrogation Session: 20250130041100
HighPow Impedance: 46 Ohm
Implantable Lead Connection Status: 753985
Implantable Lead Connection Status: 753985
Implantable Lead Connection Status: 753985
Implantable Lead Implant Date: 20111201
Implantable Lead Implant Date: 20111201
Implantable Lead Implant Date: 20111201
Implantable Lead Location: 753858
Implantable Lead Location: 753859
Implantable Lead Location: 753860
Implantable Lead Model: 158
Implantable Lead Model: 4396
Implantable Lead Model: 5076
Implantable Lead Serial Number: 302703
Implantable Pulse Generator Implant Date: 20160817
Lead Channel Impedance Value: 578 Ohm
Lead Channel Impedance Value: 765 Ohm
Lead Channel Impedance Value: 993 Ohm
Lead Channel Pacing Threshold Amplitude: 0.9 V
Lead Channel Pacing Threshold Amplitude: 1.1 V
Lead Channel Pacing Threshold Amplitude: 1.8 V
Lead Channel Pacing Threshold Pulse Width: 0.4 ms
Lead Channel Pacing Threshold Pulse Width: 0.4 ms
Lead Channel Pacing Threshold Pulse Width: 1.5 ms
Lead Channel Setting Pacing Amplitude: 2.2 V
Lead Channel Setting Pacing Amplitude: 2.4 V
Lead Channel Setting Pacing Amplitude: 2.4 V
Lead Channel Setting Pacing Pulse Width: 0.4 ms
Lead Channel Setting Pacing Pulse Width: 1.5 ms
Lead Channel Setting Sensing Sensitivity: 0.5 mV
Lead Channel Setting Sensing Sensitivity: 1 mV
Pulse Gen Serial Number: 113381

## 2023-05-06 ENCOUNTER — Other Ambulatory Visit (HOSPITAL_BASED_OUTPATIENT_CLINIC_OR_DEPARTMENT_OTHER): Payer: Self-pay

## 2023-05-07 LAB — BASIC METABOLIC PANEL
BUN/Creatinine Ratio: 25 — ABNORMAL HIGH (ref 10–24)
BUN: 38 mg/dL — ABNORMAL HIGH (ref 8–27)
CO2: 24 mmol/L (ref 20–29)
Calcium: 9.4 mg/dL (ref 8.6–10.2)
Chloride: 103 mmol/L (ref 96–106)
Creatinine, Ser: 1.5 mg/dL — ABNORMAL HIGH (ref 0.76–1.27)
Glucose: 259 mg/dL — ABNORMAL HIGH (ref 70–99)
Potassium: 4.9 mmol/L (ref 3.5–5.2)
Sodium: 144 mmol/L (ref 134–144)
eGFR: 46 mL/min/{1.73_m2} — ABNORMAL LOW (ref >59)

## 2023-05-09 ENCOUNTER — Other Ambulatory Visit: Payer: Self-pay | Admitting: *Deleted

## 2023-05-09 ENCOUNTER — Encounter: Payer: Self-pay | Admitting: *Deleted

## 2023-05-09 ENCOUNTER — Other Ambulatory Visit (HOSPITAL_BASED_OUTPATIENT_CLINIC_OR_DEPARTMENT_OTHER): Payer: Self-pay

## 2023-05-09 DIAGNOSIS — I5042 Chronic combined systolic (congestive) and diastolic (congestive) heart failure: Secondary | ICD-10-CM

## 2023-05-11 ENCOUNTER — Other Ambulatory Visit (HOSPITAL_BASED_OUTPATIENT_CLINIC_OR_DEPARTMENT_OTHER): Payer: Self-pay

## 2023-05-12 ENCOUNTER — Other Ambulatory Visit (HOSPITAL_BASED_OUTPATIENT_CLINIC_OR_DEPARTMENT_OTHER): Payer: Self-pay

## 2023-05-13 ENCOUNTER — Other Ambulatory Visit (HOSPITAL_BASED_OUTPATIENT_CLINIC_OR_DEPARTMENT_OTHER): Payer: Self-pay

## 2023-05-21 ENCOUNTER — Emergency Department (HOSPITAL_BASED_OUTPATIENT_CLINIC_OR_DEPARTMENT_OTHER)
Admission: EM | Admit: 2023-05-21 | Discharge: 2023-05-21 | Disposition: A | Discharge status: Home / Self Care | Payer: Medicare Other | Attending: Emergency Medicine | Admitting: Emergency Medicine

## 2023-05-21 ENCOUNTER — Other Ambulatory Visit: Payer: Self-pay

## 2023-05-21 ENCOUNTER — Encounter (HOSPITAL_BASED_OUTPATIENT_CLINIC_OR_DEPARTMENT_OTHER): Payer: Self-pay | Admitting: Emergency Medicine

## 2023-05-21 DIAGNOSIS — R21 Rash and other nonspecific skin eruption: Secondary | ICD-10-CM | POA: Diagnosis not present

## 2023-05-21 DIAGNOSIS — I509 Heart failure, unspecified: Secondary | ICD-10-CM | POA: Diagnosis not present

## 2023-05-21 DIAGNOSIS — M25571 Pain in right ankle and joints of right foot: Secondary | ICD-10-CM | POA: Diagnosis present

## 2023-05-21 DIAGNOSIS — R600 Localized edema: Secondary | ICD-10-CM | POA: Diagnosis not present

## 2023-05-21 LAB — BASIC METABOLIC PANEL
Anion gap: 10 (ref 5–15)
BUN: 46 mg/dL — ABNORMAL HIGH (ref 8–23)
CO2: 25 mmol/L (ref 22–32)
Calcium: 8.9 mg/dL (ref 8.9–10.3)
Chloride: 102 mmol/L (ref 98–111)
Creatinine, Ser: 1.3 mg/dL — ABNORMAL HIGH (ref 0.61–1.24)
GFR, Estimated: 55 mL/min — ABNORMAL LOW (ref >60)
Glucose, Bld: 285 mg/dL — ABNORMAL HIGH (ref 70–99)
Potassium: 4.2 mmol/L (ref 3.5–5.1)
Sodium: 137 mmol/L (ref 135–145)

## 2023-05-21 LAB — CBC WITH DIFFERENTIAL/PLATELET
Abs Immature Granulocytes: 0.05 10*3/uL (ref 0.00–0.07)
Basophils Absolute: 0.1 10*3/uL (ref 0.0–0.1)
Basophils Relative: 1 %
Eosinophils Absolute: 0.2 10*3/uL (ref 0.0–0.5)
Eosinophils Relative: 3 %
HCT: 40.4 % (ref 39.0–52.0)
Hemoglobin: 13.7 g/dL (ref 13.0–17.0)
Immature Granulocytes: 1 %
Lymphocytes Relative: 11 %
Lymphs Abs: 0.7 10*3/uL (ref 0.7–4.0)
MCH: 29.8 pg (ref 26.0–34.0)
MCHC: 33.9 g/dL (ref 30.0–36.0)
MCV: 87.8 fL (ref 80.0–100.0)
Monocytes Absolute: 0.5 10*3/uL (ref 0.1–1.0)
Monocytes Relative: 7 %
Neutro Abs: 5.2 10*3/uL (ref 1.7–7.7)
Neutrophils Relative %: 77 %
Platelets: 211 10*3/uL (ref 150–400)
RBC: 4.6 MIL/uL (ref 4.22–5.81)
RDW: 14 % (ref 11.5–15.5)
WBC: 6.7 10*3/uL (ref 4.0–10.5)
nRBC: 0 % (ref 0.0–0.2)

## 2023-05-21 MED ORDER — CEPHALEXIN 500 MG PO CAPS: 500.0000 mg | ORAL_CAPSULE | Freq: Three times a day (TID) | ORAL | 0 refills | Status: AC

## 2023-05-21 MED ORDER — ACETAMINOPHEN 500 MG PO TABS
1000.0000 mg | ORAL_TABLET | Freq: Once | ORAL | Status: AC
  Administered 2023-05-21: 1000 mg via ORAL
  Filled 2023-05-21: qty 2

## 2023-05-21 MED ORDER — CEPHALEXIN 250 MG PO CAPS
500.0000 mg | ORAL_CAPSULE | Freq: Once | ORAL | Status: AC
  Administered 2023-05-21: 500 mg via ORAL
  Filled 2023-05-21: qty 2

## 2023-05-21 NOTE — ED Notes (Signed)
 Pt described a blister "the size of half a soft ball" on his inner lower right ankle.  He reports "the pain got too and I popped it.  It bled like I needed a blood transfusion."  Once the bleeding stopped he put neosporin on it and wrapped it up.  He came in because he has had bacterial infections before and I was concerned because I am a diabetic.  States his blood sugar has been high this past week b/c "the insurance would not approve my new dose of diabetic medication."

## 2023-05-21 NOTE — ED Triage Notes (Signed)
 Pt reports that he had a blister on his right ankle develop and then hit got so big that he decided to pop it and drain it. He stated it bled a lot. Now he reports he has shooting pains in that ankle.  No other symptoms at this time.

## 2023-05-21 NOTE — ED Notes (Signed)
 RN re-wrapped pt's ankle.  Pt was able to ambulate to the bathroom w/o any difficulty.

## 2023-05-21 NOTE — ED Notes (Signed)
 Provider bedside.

## 2023-05-21 NOTE — ED Provider Notes (Signed)
 [facility] Provider Note   CSN: [id number] Arrival date & time: 05/21/23  1859     History Chief Complaint  Patient presents with   Ankle Pain    HPI [name] is a 83 y.o. male presenting for chief complaint of right ankle pain.  He is an 83 year old male with a longstanding history of heart failure on Lasix.  States that he has had worsening leg swelling and blistering.  One of the blisters popped 2 days ago and he has substantial pain and burning at the site.  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 He does deny fevers or chills, nausea or vomiting, syncope shortness of breath.  He is ambulatory tolerating p.o. intake on arrival.  Otherwise compliant on all medications..   Patient's recorded medical, surgical, social, medication list and allergies were reviewed in the Snapshot window as part of the initial history.   Review of Systems   Review of Systems  Constitutional:  Negative for chills and fever.  HENT:  Negative for ear pain and sore throat.   Eyes:  Negative for pain and visual disturbance.  Respiratory:  Negative for cough and shortness of breath.   Cardiovascular:  Positive for leg swelling. Negative for chest pain and palpitations.  Gastrointestinal:  Negative for abdominal pain and vomiting.  Genitourinary:  Negative for dysuria and hematuria.  Musculoskeletal:  Negative for arthralgias and back pain.  Skin:  Positive for rash. Negative for color change.  Neurological:  Negative for seizures and syncope.  All other systems reviewed and are negative.   Physical Exam Updated Vital Signs BP 121/68 (BP Location: Left Arm)   Pulse 79   Temp 97.7 F (36.5 C) (Oral)   Resp 20   Ht 5\' 10"  (1.778 m)   Wt 86.2 kg   SpO2 98%   BMI 27.26 kg/m  Physical Exam Vitals and nursing note reviewed.  Constitutional:      General: He is not in acute distress.    Appearance: He is well-developed.  HENT:     Head:  Normocephalic and atraumatic.  Eyes:     Conjunctiva/sclera: Conjunctivae normal.  Cardiovascular:     Rate and Rhythm: Normal rate and regular rhythm.     Heart sounds: No murmur heard. Pulmonary:     Effort: Pulmonary effort is normal. No respiratory distress.     Breath sounds: Normal breath sounds.  Abdominal:     Palpations: Abdomen is soft.     Tenderness: There is no abdominal tenderness.  Musculoskeletal:        General: No swelling.     Cervical back: Neck supple.     Right lower leg: Edema present.     Left lower leg: Edema present.  Skin:    General: Skin is warm and dry.     Capillary Refill: Capillary refill takes less than 2 seconds.     Findings: Lesion and rash present.  Neurological:     Mental Status: He is alert.  Psychiatric:        Mood and Affect: Mood normal.      ED Course/ Medical Decision Making/ A&P    Procedures Procedures   Medications Ordered in ED Medications  cephALEXin (KEFLEX) capsule 500 mg (500 mg Oral Given 05/21/23 2010)  acetaminophen (TYLENOL) tablet 1,000 mg (1,000 mg Oral Given 05/21/23 2009)   Medical Decision Making: 83 year old male presenting with a open blister and surrounding erythema.  It is very  tender to palpation. History of cellulitis appears similar at this time.  Considered necrotizing fasciitis however this appears inconsistent given the very slow spread. Will start on antibiotics including Keflex. Will treat with pain with Tylenol and will perform metabolic evaluation to ensure no risk of high risk infection. Reassessment: Lab work demonstrates no acute pathology.  CBC without gross leukocytosis. Will continue on Keflex in the outpatient setting.  Recommend he diurese at double home dose for the next 3 days and have repeat blood work done with his PCP early next week. Disposition:  I have considered need for hospitalization, however, considering all of the above, I believe this patient is stable for discharge at  this time.  Patient/family educated about specific return precautions for given chief complaint and symptoms.  Patient/family educated about follow-up with PCP.     Patient/family expressed understanding of return precautions and need for follow-up. Patient spoken to regarding all imaging and laboratory results and appropriate follow up for these results. All education provided in verbal form with additional information in written form. Time was allowed for answering of patient questions. Patient discharged.    Emergency Department Medication Summary:   Medications  cephALEXin (KEFLEX) capsule 500 mg (500 mg Oral Given 05/21/23 2010)  acetaminophen (TYLENOL) tablet 1,000 mg (1,000 mg Oral Given 05/21/23 2009)    Clinical Impression:  1. Acute right ankle pain      Discharge   Final Clinical Impression(s) / ED Diagnoses Final diagnoses:  Acute right ankle pain    Rx / DC Orders ED Discharge Orders          Ordered    cephALEXin (KEFLEX) 500 MG capsule  3 times daily        05/21/23 1950              Glyn Ade, MD 05/21/23 2100

## 2023-05-23 ENCOUNTER — Other Ambulatory Visit (HOSPITAL_BASED_OUTPATIENT_CLINIC_OR_DEPARTMENT_OTHER): Payer: Self-pay

## 2023-05-23 MED ORDER — DOXYCYCLINE HYCLATE 100 MG PO TABS
100.0000 mg | ORAL_TABLET | Freq: Two times a day (BID) | ORAL | 0 refills | Status: DC
  Filled 2023-05-23: qty 20, 10d supply, fill #0

## 2023-05-27 ENCOUNTER — Other Ambulatory Visit (HOSPITAL_BASED_OUTPATIENT_CLINIC_OR_DEPARTMENT_OTHER): Payer: Self-pay

## 2023-06-06 ENCOUNTER — Encounter: Payer: Self-pay | Admitting: Internal Medicine

## 2023-06-06 ENCOUNTER — Other Ambulatory Visit (HOSPITAL_BASED_OUTPATIENT_CLINIC_OR_DEPARTMENT_OTHER): Payer: Self-pay

## 2023-06-07 ENCOUNTER — Encounter: Payer: Self-pay | Admitting: *Deleted

## 2023-06-07 LAB — BASIC METABOLIC PANEL
BUN/Creatinine Ratio: 25 — ABNORMAL HIGH (ref 10–24)
BUN: 34 mg/dL — ABNORMAL HIGH (ref 8–27)
CO2: 24 mmol/L (ref 20–29)
Calcium: 9.4 mg/dL (ref 8.6–10.2)
Chloride: 104 mmol/L (ref 96–106)
Creatinine, Ser: 1.38 mg/dL — ABNORMAL HIGH (ref 0.76–1.27)
Glucose: 181 mg/dL — ABNORMAL HIGH (ref 70–99)
Potassium: 4.7 mmol/L (ref 3.5–5.2)
Sodium: 140 mmol/L (ref 134–144)
eGFR: 51 mL/min/{1.73_m2} — ABNORMAL LOW (ref >59)

## 2023-06-09 ENCOUNTER — Other Ambulatory Visit: Payer: Self-pay | Admitting: Cardiology

## 2023-06-09 ENCOUNTER — Other Ambulatory Visit (HOSPITAL_BASED_OUTPATIENT_CLINIC_OR_DEPARTMENT_OTHER): Payer: Self-pay

## 2023-06-09 ENCOUNTER — Other Ambulatory Visit: Payer: Self-pay

## 2023-06-09 DIAGNOSIS — I5042 Chronic combined systolic (congestive) and diastolic (congestive) heart failure: Secondary | ICD-10-CM

## 2023-06-09 MED ORDER — FUROSEMIDE 20 MG PO TABS
20.0000 mg | ORAL_TABLET | Freq: Every day | ORAL | 2 refills | Status: DC
  Filled 2023-06-09: qty 90, 90d supply, fill #0
  Filled 2023-07-08: qty 90, 90d supply, fill #1

## 2023-06-13 NOTE — Progress Notes (Signed)
 Remote ICD transmission.

## 2023-06-30 ENCOUNTER — Ambulatory Visit: Payer: Medicare Other | Attending: Cardiovascular Disease | Admitting: Cardiovascular Disease

## 2023-06-30 ENCOUNTER — Encounter: Payer: Self-pay | Admitting: Cardiovascular Disease

## 2023-06-30 DIAGNOSIS — Z95810 Presence of automatic (implantable) cardiac defibrillator: Secondary | ICD-10-CM | POA: Diagnosis not present

## 2023-06-30 DIAGNOSIS — G4733 Obstructive sleep apnea (adult) (pediatric): Secondary | ICD-10-CM

## 2023-06-30 DIAGNOSIS — I428 Other cardiomyopathies: Secondary | ICD-10-CM | POA: Diagnosis not present

## 2023-06-30 DIAGNOSIS — I10 Essential (primary) hypertension: Secondary | ICD-10-CM

## 2023-06-30 DIAGNOSIS — I872 Venous insufficiency (chronic) (peripheral): Secondary | ICD-10-CM

## 2023-06-30 DIAGNOSIS — I4819 Other persistent atrial fibrillation: Secondary | ICD-10-CM

## 2023-06-30 NOTE — Progress Notes (Signed)
 Cardiology Office Note    Date:  07/04/2023   ID:  [name], [name] [birth year], MRN [id number]  PCP:  Gaspar Garbe, MD  Cardiologist:  Nicki Guadalajara, MD (sleep); Dr. Jens Som EP: Dr. Graciela Husbands  4 month F/U sleep   History of Present Illness:  [name] is a 83 y.o. male cardiology patient of Dr. Jens Som who has a history of bicuspid aortic valve and underwent porcine aortic valve replacement in 2009 as well as a Cox-Maze procedure for atrial fibrillation.  He has history of nonischemic cardiomyopathy with initial EF prior to surgery at 20% which subsequently improved to 45 to 50%.  He subsequently underwent aortic valve replacement with 25 mm Carpentier Edwards valve replacement and also had undergone CRT-D by Dr. Graciela Husbands.  His last echo Doppler study in September 2023 showed EF 50 to 55% with apical anteroseptal akinesis.  He had moderate left atrial dilatation with mild right atrial dilatation.  There was no aortic stenosis with a 25 mm Edwards bioprosthetic valve present in the aortic position.  He is followed by Dr. Wylene Simmer for primary care.   [name] has a history of obstructive sleep apnea and apparently had undergone initial sleep evaluation at [facility] in 2008 and was started on CPAP therapy.  His machine is very old and was referred by Dr. Wylene Simmer for sleep evaluation and new device.  I saw him on February 25, 2023.  Presently, he sleeps in a recliner due primarily to sinus drainage.  He goes to bed around 9 PM and typically wakes up around 1 and 2 AM.  Once he is up he gets out of bed and typically may spend 1 to 2 hours on a computer and then goes back to bed between 3 and 4 AM and sleeps for short duration thereafter.  He often takes afternoon naps and falls asleep while watching television.  He has a history of atrial fibrillation and is status post pacemaker insertion followed by Dr. Graciela Husbands.  He remotely had been on Tikosyn but is off Tikosyn and  apparently will not take anticoagulation.  He is on carvedilol 12.5 mg twice a day, lisinopril 40 mg daily, spironolactone 25 mg for hypertension and also is on Farxiga 10 mg daily in addition to furosemide 40 mg.  During my evaluation I had an extensive discussion with him regarding untreated sleep apnea and potential adverse cardiovascular consequences.  He had very poor sleep hygiene.  I recommended reassessment of his sleep apnea and plan to schedule him for a in-lab split-night study with diagnostic CPAP or BiPAP as needed.  I discussed optimal sleep duration at 7 to 9 hours.  Since I saw him, [name] preferred not to restart therapy and he continues to sleep in a recliner.  He goes to bed around 9 PM wakes up at 4 AM and has 2 naps per day.  He does have frequent nocturia undoubtedly contributed by his untreated sleep apnea.  He continues to be on carvedilol 12.5 mg, furosemide 20 mg, lisinopril 40 mg, and spironolactone 25 mg for blood pressure control.  He is diabetic on [place] and Ozempic.  He takes baby aspirin.  He uses an inhaler as needed.  He presents for evaluation.   Past Medical History:  Diagnosis Date   Aortic stenosis/ bicuspid valve--s/p AVR    Ascending aortic aneurysm [facility])    Atrial fibrillation/flutter    Biventricular implantable cardiac defibrillator- BSX  Cholelithiases    Diabetes mellitus without complication (HCC)    DJD (degenerative joint disease)    Hypertension    LBBB (left bundle branch block)    Non-ischemic cardiomyopathy (HCC)    OSA (obstructive sleep apnea)     Past Surgical History:  Procedure Laterality Date   AORTIC VALVE REPLACEMENT     APPENDECTOMY     CARDIOVERSION N/A 05/18/2021   Procedure: CARDIOVERSION;  Surgeon: Chilton Si, MD;  Location: [facility] ENDOSCOPY;  Service: Cardiovascular;  Laterality: N/A;   CERVICAL DISCECTOMY  5/01   C7   CHOLECYSTECTOMY     EP IMPLANTABLE DEVICE N/A 11/20/2014   Procedure:  ICD Generator Changeout;   Surgeon: Duke Salvia, MD;  Location: [facility] INVASIVE CV LAB;  Service: Cardiovascular;  Laterality: N/A;   MEDIAN STERNOTOMY     TONSILLECTOMY      Current Medications: Outpatient Medications Prior to Visit  Medication Sig Dispense Refill   albuterol (VENTOLIN HFA) 108 (90 Base) MCG/ACT inhaler Inhale 2 puffs into the lungs every 6 (six) hours as needed for wheezing or shortness of breath. 18 g 1   allopurinol (ZYLOPRIM) 100 MG tablet Take 1 tablet (100 mg total) by mouth daily. 30 tablet 3   aspirin 81 MG chewable tablet Chew 81 mg by mouth daily.     carvedilol (COREG) 12.5 MG tablet Take 1 tablet (12.5 mg total) by mouth 2 (two) times daily with a meal. 180 tablet 2   dapagliflozin propanediol (FARXIGA) 10 MG TABS tablet Take 1 tablet (10 mg total) by mouth daily. 90 tablet 2   furosemide (LASIX) 20 MG tablet Take 1 tablet (20 mg total) by mouth daily. 90 tablet 2   glucose blood (ONETOUCH VERIO) test strip Use to test blood sugar once a day. 100 each 5   lisinopril (ZESTRIL) 40 MG tablet Take 1 tablet (40 mg total) by mouth daily. 90 tablet 3   mupirocin ointment (BACTROBAN) 2 % Apply 1 Application topically 2 (two) times daily. To affected area til better 22 g 0   Semaglutide, 2 MG/DOSE, (OZEMPIC, 2 MG/DOSE,) 8 MG/3ML SOPN Inject 2 mg into the skin once a week. 3 mL 11   spironolactone (ALDACTONE) 25 MG tablet Take 0.5 tablets (12.5 mg total) by mouth daily. 45 tablet 2   doxycycline (VIBRA-TABS) 100 MG tablet Take 1 tablet (100 mg total) by mouth 2 (two) times daily. (Patient not taking: Reported on 06/30/2023) 20 tablet 0   Semaglutide, 1 MG/DOSE, (OZEMPIC, 1 MG/DOSE,) 4 MG/3ML SOPN Inject 1 mg into the skin once a week. 3 mL 5   No facility-administered medications prior to visit.     Allergies:   Patient has no known allergies.   Social History   Socioeconomic History   Marital status: Married    Spouse name: Not on file   Number of children: 2   Years of education: Not on  file   Highest education level: Not on file  Occupational History   Occupation: retired Art gallery manager  Tobacco Use   Smoking status: Former   Smokeless tobacco: Never  Advertising account planner   Vaping status: Never Used  Substance and Sexual Activity   Alcohol use: No   Drug use: No   Sexual activity: Not on file  Other Topics Concern   Not on file  Social History Narrative   Not on file   Social Drivers of Health   Financial Resource Strain: Not on file  Food Insecurity: No Food Insecurity (12/13/2022)  Hunger Vital Sign    Worried About Running Out of Food in the Last Year: Never true    Ran Out of Food in the Last Year: Never true  Transportation Needs: No Transportation Needs (12/13/2022)   PRAPARE - Administrator, Civil Service (Medical): No    Lack of Transportation (Non-Medical): No  Physical Activity: Not on file  Stress: Not on file  Social Connections: Not on file    Socially he is married for 39 years.  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  He has 2 daughters.  Family History:  The patient's family history includes Alzheimer's disease in his brother; Congestive Heart Failure in his sister; Diabetes in his brother; Emphysema in his father; Hypertension in his maternal grandmother; Leukemia in his mother; Stroke in his maternal grandmother.   ROS General: Negative; No fevers, chills, or night sweats;  HEENT: Negative; No changes in vision or hearing, sinus congestion, difficulty swallowing Pulmonary: Negative; No cough, wheezing, shortness of breath, hemoptysis Cardiovascular: History of bicuspid aortic valve, status post AVR, status post pacemaker; atrial fibrillation, currently off Tikosyn and will not take anticoagulation GI: Negative; No nausea, vomiting, diarrhea, or abdominal pain GU: Negative; No dysuria, hematuria, or difficulty voiding Musculoskeletal: Negative; no myalgias, joint pain, or  weakness Hematologic/Oncology: Negative; no easy bruising, bleeding Endocrine: Diabetes mellitus Neuro: Negative; no changes in balance, headaches Skin: Negative; No rashes or skin lesions Psychiatric: Negative; No behavioral problems, depression Sleep: History of obstructive sleep apnea initially diagnosed in 2008.  Previously on CPAP.  Cannot sleep in bed but sleeps in the recliner.  Loud snoring when sleeps in bed which is significantly improved in the recliner.  Excessive daytime sleepiness.  Epworth scale calculated at 13.  Other comprehensive 14 point system review is negative.   PHYSICAL EXAM:   VS:  BP 126/72   Pulse 70   Ht 5\' 10"  (1.778 m)   Wt 197 lb 12.8 oz (89.7 kg)   SpO2 99%   BMI 28.38 kg/m     Repeat blood pressure by me was 116/74  Wt Readings from Last 3 Encounters:  06/30/23 197 lb 12.8 oz (89.7 kg)  05/21/23 190 lb (86.2 kg)  04/13/23 195 lb (88.5 kg)    General: Alert, oriented, no distress.  Skin: normal turgor, no rashes, warm and dry HEENT: Normocephalic, atraumatic. Pupils equal round and reactive to light; sclera anicteric; extraocular muscles intact;  Nose without nasal septal hypertrophy Mouth/Parynx benign; Mallinpatti scale 3/4 Neck: No JVD, no carotid bruits; normal carotid upstroke Lungs: clear to ausculatation and percussion; no wheezing or rales Chest wall: without tenderness to palpitation Heart: PMI not displaced, RRR, s1 s2 normal, 1/6 systolic murmur, no diastolic murmur, no rubs, gallops, thrills, or heaves Abdomen: Diastases recti; soft, nontender; no hepatosplenomehaly, BS+; abdominal aorta nontender and not dilated by palpation. Back: no CVA tenderness Pulses 2+ Musculoskeletal: full range of motion, normal strength, no joint deformities Extremities: Left leg bandaged; no clubbing cyanosis, Homan's sign negative  Neurologic: grossly nonfocal; Cranial nerves grossly wnl Psychologic: Normal mood and affect    Studies/Labs Reviewed:     EKG Interpretation Date/Time:  Thursday June 30 2023 08:27:21 EDT Ventricular Rate:  70 PR Interval:    QRS Duration:  172 QT Interval:  476 QTC Calculation: 514 R Axis:   -38  Text Interpretation: Ventricular-paced rhythm Biventricular pacemaker detected When compared with ECG of 25-Feb-2023 08:08, Vent. rate has decreased BY  8 BPM Confirmed by Nicki Guadalajara ([id number]) on 07/04/2023 11:08:36 AM    February 25, 2023 ECG (independently read by me): Ventricular paced rhythm, biventricular pacemaker  Recent Labs:    Latest Ref Rng & Units 06/06/2023    7:17 AM 05/21/2023    8:17 PM 05/06/2023    9:27 AM  BMP  Glucose 70 - 99 mg/dL 914  782  956   BUN 8 - 27 mg/dL 34  46  38   Creatinine 0.76 - 1.27 mg/dL 2.13  0.86  5.78   BUN/Creat Ratio 10 - 24 25   25    Sodium 134 - 144 mmol/L 140  137  144   Potassium 3.5 - 5.2 mmol/L 4.7  4.2  4.9   Chloride 96 - 106 mmol/L 104  102  103   CO2 20 - 29 mmol/L 24  25  24    Calcium 8.6 - 10.2 mg/dL 9.4  8.9  9.4         Latest Ref Rng & Units 12/14/2022    2:32 AM 12/13/2022   12:32 PM 09/18/2007   10:00 AM  Hepatic Function  Total Protein 6.5 - 8.1 g/dL 5.0  6.0  6.3   Albumin 3.5 - 5.0 g/dL 2.8  3.5  3.4   AST 15 - 41 U/L 32  32  14   ALT 0 - 44 U/L 39  43  13   Alk Phosphatase 38 - 126 U/L 58  77  75   Total Bilirubin 0.3 - 1.2 mg/dL 1.4  2.2  1.2        Latest Ref Rng & Units 05/21/2023    8:17 PM 04/13/2023    9:10 AM 12/16/2022    2:53 AM  CBC  WBC 4.0 - 10.5 K/uL 6.7  8.5  7.6   Hemoglobin 13.0 - 17.0 g/dL 46.9  62.9  52.8   Hematocrit 39.0 - 52.0 % 40.4  43.8  36.9   Platelets 150 - 400 K/uL 211  242  138    Lab Results  Component Value Date   MCV 87.8 05/21/2023   MCV 89 04/13/2023   MCV 88.3 12/16/2022   Lab Results  Component Value Date   TSH 3.535 Test methodology is 3rd generation TSH 09/18/2007   Lab Results  Component Value Date   HGBA1C 7.4 (H) 12/13/2022     BNP No results found for: "BNP"  ProBNP     Component Value Date/Time   PROBNP 120.4 (H) 12/24/2009 0832     Lipid Panel  No results found for: "CHOL", "TRIG", "HDL", "CHOLHDL", "VLDL", "LDLCALC", "LDLDIRECT", "LABVLDL"   RADIOLOGY: No results found.   Additional studies/ records that were reviewed today include:  Records of Dr. Jens Som and Dr. Graciela Husbands were reviewed.  Records of Dr. Wylene Simmer were reviewed.   ASSESSMENT:    1. OSA (obstructive sleep apnea)   2. Essential hypertension   3. Nonischemic cardiomyopathy (HCC)   4. Biventricular implantable cardioverter-defibrillator in situ   5. Persistent atrial fibrillation (HCC)   6. Venous stasis dermatitis of lower extremities     PLAN:  [name] is an 83 year old male who has a history of bicuspid aortic valve and underwent porcine aortic valve replacement in 2009.  He has a history of nonischemic cardiomyopathy and had undergone Cox-Maze procedure for atrial fibrillation at time of surgery.  He subsequently had a 25 mm Carpentier Edwards valve replacement and replacement of ascending aortic aneurysm with Hemashield graft.  He has history of recurrent A-fib and underwent CRT-D in 2011 and had previously been placed on Tikosyn for atrial fibrillation/flutter.  Patient is followed by Dr. Graciela Husbands and is no longer on Tikosyn and has refused anticoagulation.  He was originally diagnosed with sleep apnea in 2008 and has been on CPAP therapy.  His device is very old.  I had not seen him in many years.  He had stopped CPAP therapy over 4 years ago.  I saw him for reevaluation in November 2024 at which time he was sleeping in a recliner due to significant sinus drainage.  Sleep duration was suboptimal.  He often will go to bed at 9 PM but would wake up between 1 and 2 AM, go out of bed for several hours and then try to resume sleep later.  He would often take at minimum 2 naps per day.  He had frequent nocturia 4-5 times per day.  At that time I discussed potential adverse  consequences of untreated sleep apnea which undoubtedly may be playing a role in blood pressure, potential nocturnal arrhythmias, increased risk for atrial fibrillation, as well as his frequent nocturia.  Also discussed potential nocturnal hypoxemia contributing to ischemia.  Apparently, despite my last discussions, [name] has made the decision not to pursue a reevaluation of his sleep apnea.  He is followed by Dr. Jens Som for cardiology care.  He does have easy bruisability.  He does have a prominent diastases recti.  His left leg is bandaged today.  He will return to the primary care of Dr. Wylene Simmer and follow-up with Dr. Jens Som and Dr. Graciela Husbands for cardiology and pacemaker follow-up.   Medication Adjustments/Labs and Tests Ordered: Current medicines are reviewed at length with the patient today.  Concerns regarding medicines are outlined above.  Medication changes, Labs and Tests ordered today are listed in the Patient Instructions below. Patient Instructions  Medication Instructions:  No medication changes were made during today's visit.  *If you need a refill on your cardiac medications before your next appointment, please call your pharmacy*   Lab Work: No labs were ordered during today's visit.  If you have labs (blood work) drawn today and your tests are completely normal, you will receive your results only by: MyChart Message (if you have MyChart) OR A paper copy in the mail If you have any lab test that is abnormal or we need to change your treatment, we will call you to review the results.   Testing/Procedures: No procedures were ordered during today's visit.    Follow-Up: At [facility], you and your health needs are our priority.  As part of our continuing mission to provide you with exceptional heart care, we have created designated Provider Care Teams.  These Care Teams include your primary Cardiologist (physician) and Advanced Practice Providers (APPs -  Physician  Assistants and Nurse Practitioners) who all work together to provide you with the care you need, when you need it.  We recommend signing up for the patient portal called "MyChart".  Sign up information is provided on this After Visit Summary.  MyChart is used to connect with patients for Virtual Visits (Telemedicine).  Patients are able to view lab/test results, encounter notes, upcoming appointments, etc.  Non-urgent messages can be sent to your provider as well.   To learn more about what you can do with MyChart, go to [email or web address].    Your next appointment:   As needed for sleep concerns  Provider:   Dr  Armanda Magic     Other Instructions HEART & VASCULAR CENTER  [address] [place], [place] [address] OPENING [address]IL [phone]       1st Floor: - Lobby - Registration  - Pharmacy  - Lab - Cafe   2nd Floor: - PV Lab - Diagnostic Testing (echo, CT, nuclear med)   3rd Floor: - Vacant   4th Floor: - TCTS (cardiothoracic surgery) - AFib Clinic - Structural Heart Clinic - Vascular Surgery  - Vascular Ultrasound   5th Floor: - HeartCare Cardiology (general and EP) - Clinical Pharmacy for coumadin, hypertension, lipid, weight-loss medications, and med management appointments      Valet parking services will be available as well.     If you have any questions or concerns regarding your c-pap, bi-pap or sleep accessories, please contact Brandie Rorie at [phone].       Signed, Nicki Guadalajara, MD,FACC, ABSM Diplomate, American Board of Sleep Medicine    07/04/2023 11:15 AM    [facility] [address], [address], [place], [place]  [address] Phone: [phone]

## 2023-06-30 NOTE — Patient Instructions (Addendum)
 Medication Instructions:  No medication changes were made during today's visit.  *If you need a refill on your cardiac medications before your next appointment, please call your pharmacy*   Lab Work: No labs were ordered during today's visit.  If you have labs (blood work) drawn today and your tests are completely normal, you will receive your results only by: MyChart Message (if you have MyChart) OR A paper copy in the mail If you have any lab test that is abnormal or we need to change your treatment, we will call you to review the results.   Testing/Procedures: No procedures were ordered during today's visit.    Follow-Up: At [facility], you and your health needs are our priority.  As part of our continuing mission to provide you with exceptional heart care, we have created designated Provider Care Teams.  These Care Teams include your primary Cardiologist (physician) and Advanced Practice Providers (APPs -  Physician Assistants and Nurse Practitioners) who all work together to provide you with the care you need, when you need it.  We recommend signing up for the patient portal called "MyChart".  Sign up information is provided on this After Visit Summary.  MyChart is used to connect with patients for Virtual Visits (Telemedicine).  Patients are able to view lab/test results, encounter notes, upcoming appointments, etc.  Non-urgent messages can be sent to your provider as well.   To learn more about what you can do with MyChart, go to [email or web address].    Your next appointment:   As needed for sleep concerns  Provider:   Dr Armanda Magic     Other Instructions HEART & VASCULAR CENTER  [address] [place], [place]  [address] OPENING [address] [phone]       1st Floor: - Lobby - Registration  - Pharmacy  - Lab - Cafe   2nd Floor: - PV Lab - Diagnostic Testing (echo, CT, nuclear med)   3rd Floor: - Vacant   4th Floor: - TCTS (cardiothoracic  surgery) - AFib Clinic - Structural Heart Clinic - Vascular Surgery  - Vascular Ultrasound   5th Floor: - HeartCare Cardiology (general and EP) - Clinical Pharmacy for coumadin, hypertension, lipid, weight-loss medications, and med management appointments      Valet parking services will be available as well.     If you have any questions or concerns regarding your c-pap, bi-pap or sleep accessories, please contact Brandie Rorie at [phone].

## 2023-07-08 ENCOUNTER — Other Ambulatory Visit: Payer: Self-pay

## 2023-07-08 ENCOUNTER — Other Ambulatory Visit (HOSPITAL_BASED_OUTPATIENT_CLINIC_OR_DEPARTMENT_OTHER): Payer: Self-pay

## 2023-07-13 ENCOUNTER — Other Ambulatory Visit: Payer: Self-pay | Admitting: Internal Medicine

## 2023-07-13 ENCOUNTER — Other Ambulatory Visit (HOSPITAL_BASED_OUTPATIENT_CLINIC_OR_DEPARTMENT_OTHER): Payer: Self-pay

## 2023-07-13 ENCOUNTER — Other Ambulatory Visit (HOSPITAL_COMMUNITY): Payer: Self-pay

## 2023-07-13 ENCOUNTER — Telehealth: Payer: Self-pay | Admitting: Cardiology

## 2023-07-13 DIAGNOSIS — I5042 Chronic combined systolic (congestive) and diastolic (congestive) heart failure: Secondary | ICD-10-CM

## 2023-07-13 NOTE — Telephone Encounter (Signed)
 Pt c/o medication issue:  1. Name of Medication:   furosemide (LASIX) 20 MG tablet    2. How are you currently taking this medication (dosage and times per day)? As written   3. Are you having a reaction (difficulty breathing--STAT)? No   4. What is your medication issue?  [facility] called in stating pt does not feel like 20mg  is working, she sent a requesting for 40mg  since he was on that before. Please advise if this rx can be changed and sent over.

## 2023-07-13 NOTE — Telephone Encounter (Signed)
 Patient identification verified by 2 forms. Marilynn Rail, RN    Called and spoke to patients wife Clint Guy states:   -patient has swelling in his ankles and feet   -swelling on going since for a few Rx   -New Rx for lasix is not working   -Takes Furosemide 40 mg daily  Patient denies:   -SOB/difficulty breathing Informed patient message sent to Dr. Jens Som  Patient verbalized understanding, no questions at this time

## 2023-07-14 ENCOUNTER — Other Ambulatory Visit (HOSPITAL_BASED_OUTPATIENT_CLINIC_OR_DEPARTMENT_OTHER): Payer: Self-pay

## 2023-07-14 ENCOUNTER — Other Ambulatory Visit: Payer: Self-pay

## 2023-07-14 MED ORDER — FUROSEMIDE 40 MG PO TABS
80.0000 mg | ORAL_TABLET | Freq: Every day | ORAL | 3 refills | Status: DC
  Filled 2023-07-14: qty 180, 90d supply, fill #0
  Filled 2023-10-15: qty 180, 90d supply, fill #1

## 2023-07-14 NOTE — Telephone Encounter (Signed)
Spoke with pt, Aware of dr crenshaw's recommendations.  Lab orders mailed to the pt  

## 2023-07-25 LAB — BASIC METABOLIC PANEL WITH GFR
BUN/Creatinine Ratio: 34 — ABNORMAL HIGH (ref 10–24)
BUN: 44 mg/dL — ABNORMAL HIGH (ref 8–27)
CO2: 25 mmol/L (ref 20–29)
Calcium: 9 mg/dL (ref 8.6–10.2)
Chloride: 100 mmol/L (ref 96–106)
Creatinine, Ser: 1.31 mg/dL — ABNORMAL HIGH (ref 0.76–1.27)
Glucose: 276 mg/dL — ABNORMAL HIGH (ref 70–99)
Potassium: 4.3 mmol/L (ref 3.5–5.2)
Sodium: 141 mmol/L (ref 134–144)
eGFR: 54 mL/min/{1.73_m2} — ABNORMAL LOW (ref >59)

## 2023-07-26 ENCOUNTER — Encounter: Payer: Self-pay | Admitting: *Deleted

## 2023-08-04 ENCOUNTER — Other Ambulatory Visit: Payer: Self-pay

## 2023-08-04 ENCOUNTER — Other Ambulatory Visit (HOSPITAL_BASED_OUTPATIENT_CLINIC_OR_DEPARTMENT_OTHER): Payer: Self-pay

## 2023-08-04 ENCOUNTER — Ambulatory Visit: Attending: Internal Medicine

## 2023-08-04 DIAGNOSIS — I428 Other cardiomyopathies: Secondary | ICD-10-CM

## 2023-08-04 LAB — CUP PACEART REMOTE DEVICE CHECK
Battery Remaining Longevity: 10 mo
Battery Remaining Percentage: 13 %
Brady Statistic RA Percent Paced: 0 %
Brady Statistic RV Percent Paced: 42 %
Date Time Interrogation Session: 20250501041000
HighPow Impedance: 45 Ohm
Implantable Lead Connection Status: 753985
Implantable Lead Connection Status: 753985
Implantable Lead Connection Status: 753985
Implantable Lead Implant Date: 20111201
Implantable Lead Implant Date: 20111201
Implantable Lead Implant Date: 20111201
Implantable Lead Location: 753858
Implantable Lead Location: 753859
Implantable Lead Location: 753860
Implantable Lead Model: 158
Implantable Lead Model: 4396
Implantable Lead Model: 5076
Implantable Lead Serial Number: 302703
Implantable Pulse Generator Implant Date: 20160817
Lead Channel Impedance Value: 576 Ohm
Lead Channel Impedance Value: 679 Ohm
Lead Channel Impedance Value: 828 Ohm
Lead Channel Pacing Threshold Amplitude: 0.9 V
Lead Channel Pacing Threshold Amplitude: 1.1 V
Lead Channel Pacing Threshold Amplitude: 1.8 V
Lead Channel Pacing Threshold Pulse Width: 0.4 ms
Lead Channel Pacing Threshold Pulse Width: 0.4 ms
Lead Channel Pacing Threshold Pulse Width: 1.5 ms
Lead Channel Setting Pacing Amplitude: 2.2 V
Lead Channel Setting Pacing Amplitude: 2.4 V
Lead Channel Setting Pacing Amplitude: 2.4 V
Lead Channel Setting Pacing Pulse Width: 0.4 ms
Lead Channel Setting Pacing Pulse Width: 1.5 ms
Lead Channel Setting Sensing Sensitivity: 0.5 mV
Lead Channel Setting Sensing Sensitivity: 1 mV
Pulse Gen Serial Number: 113381

## 2023-08-04 MED ORDER — ALLOPURINOL 100 MG PO TABS
100.0000 mg | ORAL_TABLET | Freq: Every day | ORAL | 3 refills | Status: DC
  Filled 2023-08-04: qty 30, 30d supply, fill #0
  Filled 2023-08-25 – 2023-09-01 (×2): qty 30, 30d supply, fill #1
  Filled 2023-10-05: qty 30, 30d supply, fill #2

## 2023-08-07 ENCOUNTER — Encounter: Payer: Self-pay | Admitting: Internal Medicine

## 2023-08-25 ENCOUNTER — Other Ambulatory Visit (HOSPITAL_BASED_OUTPATIENT_CLINIC_OR_DEPARTMENT_OTHER): Payer: Self-pay

## 2023-08-25 ENCOUNTER — Other Ambulatory Visit: Payer: Self-pay

## 2023-09-01 ENCOUNTER — Other Ambulatory Visit (HOSPITAL_BASED_OUTPATIENT_CLINIC_OR_DEPARTMENT_OTHER): Payer: Self-pay

## 2023-09-01 ENCOUNTER — Other Ambulatory Visit: Payer: Self-pay

## 2023-09-08 ENCOUNTER — Other Ambulatory Visit (HOSPITAL_BASED_OUTPATIENT_CLINIC_OR_DEPARTMENT_OTHER): Payer: Self-pay

## 2023-09-08 MED ORDER — DAPAGLIFLOZIN PROPANEDIOL 10 MG PO TABS
10.0000 mg | ORAL_TABLET | Freq: Every day | ORAL | 3 refills | Status: AC
  Filled 2023-09-08: qty 90, 90d supply, fill #0
  Filled 2023-12-23: qty 90, 90d supply, fill #1

## 2023-09-15 NOTE — Progress Notes (Signed)
 Remote ICD transmission.

## 2023-09-29 ENCOUNTER — Other Ambulatory Visit (HOSPITAL_BASED_OUTPATIENT_CLINIC_OR_DEPARTMENT_OTHER): Payer: Self-pay

## 2023-10-15 ENCOUNTER — Other Ambulatory Visit: Payer: Self-pay | Admitting: Cardiology

## 2023-10-17 ENCOUNTER — Other Ambulatory Visit: Payer: Self-pay

## 2023-10-17 ENCOUNTER — Other Ambulatory Visit (HOSPITAL_BASED_OUTPATIENT_CLINIC_OR_DEPARTMENT_OTHER): Payer: Self-pay

## 2023-10-17 MED ORDER — SPIRONOLACTONE 25 MG PO TABS
12.5000 mg | ORAL_TABLET | Freq: Every day | ORAL | 2 refills | Status: DC
  Filled 2023-10-17 – 2023-10-30 (×2): qty 45, 90d supply, fill #0

## 2023-10-20 ENCOUNTER — Other Ambulatory Visit (HOSPITAL_BASED_OUTPATIENT_CLINIC_OR_DEPARTMENT_OTHER): Payer: Self-pay

## 2023-10-31 ENCOUNTER — Other Ambulatory Visit (HOSPITAL_BASED_OUTPATIENT_CLINIC_OR_DEPARTMENT_OTHER): Payer: Self-pay

## 2023-11-01 ENCOUNTER — Other Ambulatory Visit (HOSPITAL_BASED_OUTPATIENT_CLINIC_OR_DEPARTMENT_OTHER): Payer: Self-pay

## 2023-11-03 ENCOUNTER — Ambulatory Visit (INDEPENDENT_AMBULATORY_CARE_PROVIDER_SITE_OTHER)

## 2023-11-03 DIAGNOSIS — I428 Other cardiomyopathies: Secondary | ICD-10-CM

## 2023-11-03 LAB — CUP PACEART REMOTE DEVICE CHECK
Battery Remaining Longevity: 3 mo — CL
Battery Remaining Percentage: 2 %
Brady Statistic RA Percent Paced: 0 %
Brady Statistic RV Percent Paced: 38 %
Date Time Interrogation Session: 20250731041100
HighPow Impedance: 47 Ohm
Implantable Lead Connection Status: 753985
Implantable Lead Connection Status: 753985
Implantable Lead Connection Status: 753985
Implantable Lead Implant Date: 20111201
Implantable Lead Implant Date: 20111201
Implantable Lead Implant Date: 20111201
Implantable Lead Location: 753858
Implantable Lead Location: 753859
Implantable Lead Location: 753860
Implantable Lead Model: 158
Implantable Lead Model: 4396
Implantable Lead Model: 5076
Implantable Lead Serial Number: 302703
Implantable Pulse Generator Implant Date: 20160817
Lead Channel Impedance Value: 580 Ohm
Lead Channel Impedance Value: 740 Ohm
Lead Channel Impedance Value: 885 Ohm
Lead Channel Pacing Threshold Amplitude: 0.9 V
Lead Channel Pacing Threshold Amplitude: 1.1 V
Lead Channel Pacing Threshold Amplitude: 1.8 V
Lead Channel Pacing Threshold Pulse Width: 0.4 ms
Lead Channel Pacing Threshold Pulse Width: 0.4 ms
Lead Channel Pacing Threshold Pulse Width: 1.5 ms
Lead Channel Setting Pacing Amplitude: 2.2 V
Lead Channel Setting Pacing Amplitude: 2.4 V
Lead Channel Setting Pacing Amplitude: 2.4 V
Lead Channel Setting Pacing Pulse Width: 0.4 ms
Lead Channel Setting Pacing Pulse Width: 1.5 ms
Lead Channel Setting Sensing Sensitivity: 0.5 mV
Lead Channel Setting Sensing Sensitivity: 1 mV
Pulse Gen Serial Number: 113381

## 2023-11-07 ENCOUNTER — Encounter

## 2023-11-14 ENCOUNTER — Ambulatory Visit: Payer: Self-pay | Admitting: Cardiology

## 2023-11-16 ENCOUNTER — Other Ambulatory Visit: Payer: Self-pay

## 2023-11-16 ENCOUNTER — Encounter (HOSPITAL_COMMUNITY): Payer: Self-pay | Admitting: Emergency Medicine

## 2023-11-16 ENCOUNTER — Emergency Department (HOSPITAL_COMMUNITY)

## 2023-11-16 ENCOUNTER — Inpatient Hospital Stay (HOSPITAL_COMMUNITY)
Admission: EM | Admit: 2023-11-16 | Discharge: 2023-11-21 | DRG: 308 | Disposition: A | Discharge status: Home Health | Attending: Internal Medicine | Admitting: Internal Medicine

## 2023-11-16 DIAGNOSIS — D696 Thrombocytopenia, unspecified: Secondary | ICD-10-CM | POA: Diagnosis present

## 2023-11-16 DIAGNOSIS — Z806 Family history of leukemia: Secondary | ICD-10-CM

## 2023-11-16 DIAGNOSIS — Z7985 Long-term (current) use of injectable non-insulin antidiabetic drugs: Secondary | ICD-10-CM

## 2023-11-16 DIAGNOSIS — I4821 Permanent atrial fibrillation: Principal | ICD-10-CM | POA: Diagnosis present

## 2023-11-16 DIAGNOSIS — Z953 Presence of xenogenic heart valve: Secondary | ICD-10-CM

## 2023-11-16 DIAGNOSIS — R1084 Generalized abdominal pain: Secondary | ICD-10-CM

## 2023-11-16 DIAGNOSIS — S2231XA Fracture of one rib, right side, initial encounter for closed fracture: Secondary | ICD-10-CM | POA: Diagnosis present

## 2023-11-16 DIAGNOSIS — R0902 Hypoxemia: Secondary | ICD-10-CM | POA: Diagnosis present

## 2023-11-16 DIAGNOSIS — Z825 Family history of asthma and other chronic lower respiratory diseases: Secondary | ICD-10-CM

## 2023-11-16 DIAGNOSIS — R7989 Other specified abnormal findings of blood chemistry: Secondary | ICD-10-CM

## 2023-11-16 DIAGNOSIS — E871 Hypo-osmolality and hyponatremia: Secondary | ICD-10-CM | POA: Diagnosis present

## 2023-11-16 DIAGNOSIS — I959 Hypotension, unspecified: Secondary | ICD-10-CM | POA: Diagnosis present

## 2023-11-16 DIAGNOSIS — Z823 Family history of stroke: Secondary | ICD-10-CM

## 2023-11-16 DIAGNOSIS — I4891 Unspecified atrial fibrillation: Secondary | ICD-10-CM | POA: Diagnosis present

## 2023-11-16 DIAGNOSIS — I428 Other cardiomyopathies: Secondary | ICD-10-CM | POA: Diagnosis present

## 2023-11-16 DIAGNOSIS — Z79899 Other long term (current) drug therapy: Secondary | ICD-10-CM

## 2023-11-16 DIAGNOSIS — M109 Gout, unspecified: Secondary | ICD-10-CM | POA: Diagnosis present

## 2023-11-16 DIAGNOSIS — R6510 Systemic inflammatory response syndrome (SIRS) of non-infectious origin without acute organ dysfunction: Secondary | ICD-10-CM | POA: Diagnosis present

## 2023-11-16 DIAGNOSIS — I4892 Unspecified atrial flutter: Principal | ICD-10-CM | POA: Diagnosis present

## 2023-11-16 DIAGNOSIS — Z833 Family history of diabetes mellitus: Secondary | ICD-10-CM

## 2023-11-16 DIAGNOSIS — Y92008 Other place in unspecified non-institutional (private) residence as the place of occurrence of the external cause: Secondary | ICD-10-CM

## 2023-11-16 DIAGNOSIS — Z820 Family history of epilepsy and other diseases of the nervous system: Secondary | ICD-10-CM

## 2023-11-16 DIAGNOSIS — I878 Other specified disorders of veins: Secondary | ICD-10-CM | POA: Diagnosis present

## 2023-11-16 DIAGNOSIS — Z87891 Personal history of nicotine dependence: Secondary | ICD-10-CM

## 2023-11-16 DIAGNOSIS — Z1152 Encounter for screening for COVID-19: Secondary | ICD-10-CM

## 2023-11-16 DIAGNOSIS — R112 Nausea with vomiting, unspecified: Secondary | ICD-10-CM | POA: Diagnosis not present

## 2023-11-16 DIAGNOSIS — I129 Hypertensive chronic kidney disease with stage 1 through stage 4 chronic kidney disease, or unspecified chronic kidney disease: Secondary | ICD-10-CM | POA: Diagnosis present

## 2023-11-16 DIAGNOSIS — E1122 Type 2 diabetes mellitus with diabetic chronic kidney disease: Secondary | ICD-10-CM | POA: Diagnosis present

## 2023-11-16 DIAGNOSIS — W07XXXA Fall from chair, initial encounter: Secondary | ICD-10-CM | POA: Diagnosis present

## 2023-11-16 DIAGNOSIS — Z7982 Long term (current) use of aspirin: Secondary | ICD-10-CM

## 2023-11-16 DIAGNOSIS — N170 Acute kidney failure with tubular necrosis: Secondary | ICD-10-CM | POA: Diagnosis present

## 2023-11-16 DIAGNOSIS — N1831 Chronic kidney disease, stage 3a: Secondary | ICD-10-CM | POA: Diagnosis present

## 2023-11-16 DIAGNOSIS — Z8249 Family history of ischemic heart disease and other diseases of the circulatory system: Secondary | ICD-10-CM

## 2023-11-16 DIAGNOSIS — Z9049 Acquired absence of other specified parts of digestive tract: Secondary | ICD-10-CM

## 2023-11-16 DIAGNOSIS — S2239XA Fracture of one rib, unspecified side, initial encounter for closed fracture: Secondary | ICD-10-CM

## 2023-11-16 LAB — CBC WITH DIFFERENTIAL/PLATELET
Abs Immature Granulocytes: 0.26 K/uL — ABNORMAL HIGH (ref 0.00–0.07)
Basophils Absolute: 0 K/uL (ref 0.0–0.1)
Basophils Relative: 0 %
Eosinophils Absolute: 0 K/uL (ref 0.0–0.5)
Eosinophils Relative: 0 %
HCT: 41 % (ref 39.0–52.0)
Hemoglobin: 13.4 g/dL (ref 13.0–17.0)
Immature Granulocytes: 2 %
Lymphocytes Relative: 1 %
Lymphs Abs: 0.2 K/uL — ABNORMAL LOW (ref 0.7–4.0)
MCH: 28.8 pg (ref 26.0–34.0)
MCHC: 32.7 g/dL (ref 30.0–36.0)
MCV: 88 fL (ref 80.0–100.0)
Monocytes Absolute: 1.5 K/uL — ABNORMAL HIGH (ref 0.1–1.0)
Monocytes Relative: 9 %
Neutro Abs: 15.3 K/uL — ABNORMAL HIGH (ref 1.7–7.7)
Neutrophils Relative %: 88 %
Platelets: 132 K/uL — ABNORMAL LOW (ref 150–400)
RBC: 4.66 MIL/uL (ref 4.22–5.81)
RDW: 14.6 % (ref 11.5–15.5)
WBC: 17.3 K/uL — ABNORMAL HIGH (ref 4.0–10.5)
nRBC: 0 % (ref 0.0–0.2)

## 2023-11-16 LAB — CBG MONITORING, ED: Glucose-Capillary: 228 mg/dL — ABNORMAL HIGH (ref 70–99)

## 2023-11-16 LAB — LACTIC ACID, PLASMA: Lactic Acid, Venous: 1.5 mmol/L (ref 0.5–1.9)

## 2023-11-16 MED ORDER — ONDANSETRON HCL 4 MG/2ML IJ SOLN
4.0000 mg | Freq: Once | INTRAMUSCULAR | Status: AC
  Administered 2023-11-16 (×2): 4 mg via INTRAVENOUS
  Filled 2023-11-16: qty 2

## 2023-11-16 MED ORDER — LACTATED RINGERS IV BOLUS
1000.0000 mL | Freq: Once | INTRAVENOUS | Status: AC
  Administered 2023-11-16 (×2): 1000 mL via INTRAVENOUS

## 2023-11-16 MED ORDER — FENTANYL CITRATE PF 50 MCG/ML IJ SOSY
50.0000 ug | PREFILLED_SYRINGE | Freq: Once | INTRAMUSCULAR | Status: DC
  Filled 2023-11-16: qty 1

## 2023-11-16 NOTE — ED Triage Notes (Signed)
 Pt arriving via [facility] from home for N/V all day. Pt reports he ate a salad yesterday and has not felt well since. Pt states the last time he was sick like this he had E. Coli in his blood stream. Pt slightly pale and diaphoretic. A&O x4. Temp @ home was 100.6, wife gave him Tylenol  and Pepto prior to EMS arrival. EMS gave pt 4mg  Zofran  IV. Pt denies any pain at this time. Hx Afib.

## 2023-11-16 NOTE — ED Notes (Signed)
Urine specimen @ bedside. 

## 2023-11-16 NOTE — ED Notes (Signed)
 RN assumed care at this time, report from [facility]

## 2023-11-16 NOTE — ED Provider Notes (Signed)
  EMERGENCY DEPARTMENT AT [facility] Provider Note   CSN: [id number] Arrival date & time: 11/16/23  2109     Patient presents with: Emesis and Nausea   [name] is a 83 y.o. male.   Patient with a history of left bundle branch block, atrial fibrillation, hypertension, aortic valve replacement, defibrillator use presents with abdominal pain, nausea and vomiting.  Has been feeling ill since eating a salad yesterday.  Has pain to his left lower quadrant with multiple episodes of nausea vomiting x 4 5 today.  He is pale and diaphoretic.  Denies chest pain or shortness of breath.  Temperature at home 100.6.  No travel or sick contacts.  Feels similar to when he had E. coli previously. Pain to left lower quadrant with nausea vomiting and diaphoresis.  Denies chest pain or shortness of breath.  Does have a history of atrial fibrillation.  Does not take blood thinners.  Denies chest pain or shortness of breath.  Previous cholecystectomy and appendectomy.  The history is provided by the patient.  Emesis Associated symptoms: abdominal pain and fever   Associated symptoms: no arthralgias, no headaches, no myalgias and no sore throat        Prior to Admission medications   Medication Sig Start Date End Date Taking? Authorizing Provider  albuterol  (VENTOLIN  HFA) 108 (90 Base) MCG/ACT inhaler Inhale 2 puffs into the lungs every 6 (six) hours as needed for wheezing or shortness of breath. 12/16/22   Pokhrel, Vernal, MD  allopurinol  (ZYLOPRIM ) 100 MG tablet Take 1 tablet (100 mg total) by mouth daily. 08/04/23     aspirin  81 MG chewable tablet Chew 81 mg by mouth daily.    [provider]  carvedilol  (COREG ) 12.5 MG tablet Take 1 tablet (12.5 mg total) by mouth 2 (two) times daily with a meal. 01/25/23   Crenshaw, Redell RAMAN, MD  dapagliflozin  propanediol (FARXIGA ) 10 MG TABS tablet Take 1 tablet (10 mg total) by mouth daily. 09/08/23     furosemide  (LASIX ) 40 MG tablet  Take 1 tablet (40 mg total) by mouth daily. Take an extra tablet as needed for swelling or shortness of breath. 07/14/23   Pietro Redell RAMAN, MD  glucose blood (ONETOUCH VERIO) test strip Use to test blood sugar once a day. 10/21/22     lisinopril  (ZESTRIL ) 40 MG tablet Take 1 tablet (40 mg total) by mouth daily. 11/26/22     mupirocin  ointment (BACTROBAN ) 2 % Apply 1 Application topically 2 (two) times daily. To affected area til better 03/04/23   Vonna Sharlet POUR, MD  Semaglutide , 2 MG/DOSE, (OZEMPIC , 2 MG/DOSE,) 8 MG/3ML SOPN Inject 2 mg into the skin once a week. 04/07/23     spironolactone  (ALDACTONE ) 25 MG tablet Take 0.5 tablets (12.5 mg total) by mouth daily. 10/17/23 10/16/24  Pietro Redell RAMAN, MD    Allergies: Patient has no known allergies.    Review of Systems  Constitutional:  Positive for activity change, appetite change and fever.  HENT:  Negative for sore throat.   Respiratory:  Negative for chest tightness and shortness of breath.   Cardiovascular:  Negative for chest pain.  Gastrointestinal:  Positive for abdominal pain, nausea and vomiting.  Genitourinary:  Negative for dysuria and hematuria.  Musculoskeletal:  Negative for arthralgias and myalgias.  Skin:  Negative for rash.  Neurological:  Negative for dizziness, weakness and headaches.    all other systems are negative except as noted in the HPI and PMH.  Updated Vital Signs BP 91/62   Pulse (!) 114   Temp 98.7 F (37.1 C)   Resp 20   SpO2 91%   Physical Exam Vitals and nursing note reviewed.  Constitutional:      General: He is not in acute distress.    Appearance: He is well-developed.  HENT:     Head: Normocephalic and atraumatic.     Mouth/Throat:     Pharynx: No oropharyngeal exudate.  Eyes:     Conjunctiva/sclera: Conjunctivae normal.     Pupils: Pupils are equal, round, and reactive to light.  Neck:     Comments: No meningismus. Cardiovascular:     Rate and Rhythm: Regular rhythm. Tachycardia  present.     Heart sounds: Normal heart sounds. No murmur heard. Pulmonary:     Effort: Pulmonary effort is normal. No respiratory distress.     Breath sounds: Normal breath sounds.  Abdominal:     Palpations: Abdomen is soft.     Tenderness: There is abdominal tenderness. There is no guarding or rebound.     Comments: Left lower quadrant, voluntary guarding.  No rebound  Musculoskeletal:        General: No tenderness. Normal range of motion.     Cervical back: Normal range of motion and neck supple.  Skin:    General: Skin is warm.  Neurological:     Mental Status: He is alert and oriented to person, place, and time.     Cranial Nerves: No cranial nerve deficit.     Motor: No abnormal muscle tone.     Coordination: Coordination normal.     Comments:  5/5 strength throughout. CN 2-12 intact.Equal grip strength.   Psychiatric:        Behavior: Behavior normal.     (all labs ordered are listed, but only abnormal results are displayed) Labs Reviewed  CBC WITH DIFFERENTIAL/PLATELET - Abnormal; Notable for the following components:      Result Value   WBC 17.3 (*)    Platelets 132 (*)    Neutro Abs 15.3 (*)    Lymphs Abs 0.2 (*)    Monocytes Absolute 1.5 (*)    Abs Immature Granulocytes 0.26 (*)    All other components within normal limits  COMPREHENSIVE METABOLIC PANEL WITH GFR - Abnormal; Notable for the following components:   Glucose, Bld 225 (*)    BUN 37 (*)    Total Protein 6.1 (*)    Total Bilirubin 3.0 (*)    All other components within normal limits  URINALYSIS, ROUTINE W REFLEX MICROSCOPIC - Abnormal; Notable for the following components:   Glucose, UA >=500 (*)    All other components within normal limits  CBG MONITORING, ED - Abnormal; Notable for the following components:   Glucose-Capillary 228 (*)    All other components within normal limits  TROPONIN I (HIGH SENSITIVITY) - Abnormal; Notable for the following components:   Troponin I (High Sensitivity) 20  (*)    All other components within normal limits  TROPONIN I (HIGH SENSITIVITY) - Abnormal; Notable for the following components:   Troponin I (High Sensitivity) 21 (*)    All other components within normal limits  CULTURE, BLOOD (ROUTINE X 2)  CULTURE, BLOOD (ROUTINE X 2)  LIPASE, BLOOD  LACTIC ACID, PLASMA  LACTIC ACID, PLASMA    EKG: EKG Interpretation Date/Time:  Wednesday November 16 2023 23:18:14 EDT Ventricular Rate:  112 PR Interval:  403 QRS Duration:  164 QT Interval:  385  QTC Calculation: 526 R Axis:   200  Text Interpretation: Ventricular-paced rhythm No further analysis attempted due to paced rhythm No significant change was found Confirmed by Carita Senior [phone]) on 11/17/2023 12:17:33 AM  Radiology: CT ABDOMEN PELVIS W CONTRAST Result Date: 11/17/2023 CLINICAL DATA:  Acute abdominal pain EXAM: CT ABDOMEN AND PELVIS WITH CONTRAST TECHNIQUE: Multidetector CT imaging of the abdomen and pelvis was performed using the standard protocol following bolus administration of intravenous contrast. RADIATION DOSE REDUCTION: This exam was performed according to the departmental dose-optimization program which includes automated exposure control, adjustment of the mA and/or kV according to patient size and/or use of iterative reconstruction technique. CONTRAST:  OMNIPAQUE  IOHEXOL  350 MG/ML SOLN COMPARISON:  Abdominal film from earlier in the same day. FINDINGS: Lower chest: Lung bases are free of acute infiltrate or sizable effusion. Hepatobiliary: Gallbladder has been surgically removed. Liver appears within normal limits. Pancreas: Unremarkable. No pancreatic ductal dilatation or surrounding inflammatory changes. Spleen: Normal in size without focal abnormality. Adrenals/Urinary Tract: Adrenal glands are within normal limits. Kidneys demonstrate a normal enhancement pattern bilaterally. No renal calculi or obstructive changes are noted. Delayed images demonstrate no acute  abnormality. The ureters are within normal limits. The bladder is decompressed. Stomach/Bowel: No obstructive or inflammatory changes of the colon are seen. The appendix is not visualized consistent with prior surgical history. The small bowel and stomach are unremarkable. Vascular/Lymphatic: Aortic atherosclerosis. No enlarged abdominal or pelvic lymph nodes. Reproductive: Prostate is unremarkable. Other: No abdominal wall hernia or abnormality. No abdominopelvic ascites. Musculoskeletal: Fracture of the L2 transverse process on the right is noted of uncertain chronicity. Degenerative changes of the lumbar spine are noted. IMPRESSION: Fracture of the L2 transverse process on the right of uncertain chronicity. No other focal abnormality is noted. Electronically Signed   By: Oneil Devonshire M.D.   On: 11/17/2023 01:09   CT Angio Chest PE W and/or Wo Contrast Result Date: 11/17/2023 CLINICAL DATA:  Nausea and vomiting. EXAM: CT ANGIOGRAPHY CHEST WITH CONTRAST TECHNIQUE: Multidetector CT imaging of the chest was performed using the standard protocol during bolus administration of intravenous contrast. Multiplanar CT image reconstructions and MIPs were obtained to evaluate the vascular anatomy. RADIATION DOSE REDUCTION: This exam was performed according to the departmental dose-optimization program which includes automated exposure control, adjustment of the mA and/or kV according to patient size and/or use of iterative reconstruction technique. CONTRAST:  OMNIPAQUE  IOHEXOL  350 MG/ML SOLN COMPARISON:  None Available. FINDINGS: Cardiovascular: A dual lead AICD is in place. There is marked severity calcification of the thoracic aorta. An artificial aortic valve is seen. Satisfactory opacification of the pulmonary arteries to the segmental level. No evidence of pulmonary embolism. Normal heart size with marked severity coronary artery calcification. No pericardial effusion. Mediastinum/Nodes: No enlarged mediastinal,  hilar, or axillary lymph nodes. Thyroid gland, trachea, and esophagus demonstrate no significant findings. Lungs/Pleura: There is a 4 mm posterolateral right upper lobe calcified lung nodule (axial CT image 57, CT series 4). 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 Mild lingular and mild bibasilar atelectasis is seen. No pleural effusion or pneumothorax is identified. Upper Abdomen: Multiple surgical clips are seen within the gallbladder fossa. A mild amount of central pneumobilia is seen. This is decreased in severity when compared to the prior study. Musculoskeletal: Multiple sternal wires are present. Postoperative changes are seen within the visualized portion of the lower cervical spine. An acute, mildly displaced posterior eleventh right rib fracture is seen (axial  CT images 136 through 139, CT series 4). Review of the MIP images confirms the above findings. IMPRESSION: 1. No evidence of pulmonary embolism. 2. Acute, mildly displaced posterior eleventh right rib fracture. 3. Mild lingular and mild bibasilar atelectasis. 4. Evidence of prior cholecystectomy. 5. Aortic atherosclerosis. Electronically Signed   By: Suzen Dials M.D.   On: 11/17/2023 01:07   DG Chest Portable 1 View Result Date: 11/17/2023 CLINICAL DATA:  Nausea and vomiting and abdominal pain, initial encounter EXAM: PORTABLE CHEST 1 VIEW COMPARISON:  12/15/2022 FINDINGS: Stable cardiomegaly is noted. Defibrillator is again seen and stable. Postsurgical changes in the cervical spine are noted. No focal infiltrate or effusion is seen. No acute bony abnormality is noted. IMPRESSION: No acute abnormality noted. Electronically Signed   By: Oneil Devonshire M.D.   On: 11/17/2023 00:30   DG Abd Portable 1 View Result Date: 11/17/2023 CLINICAL DATA:  Nausea and vomiting and abdominal pain for 1 day, initial encounter EXAM: PORTABLE ABDOMEN - 1 VIEW COMPARISON:  12/13/2022 FINDINGS: Scattered large and  small bowel gas is noted. No obstructive changes are seen. No free air is noted. No abnormal mass or abnormal calcifications are noted. Degenerative change of the lumbar spine. IMPRESSION: No acute abnormality noted. Electronically Signed   By: Oneil Devonshire M.D.   On: 11/17/2023 00:29     Procedures   Medications Ordered in the ED  fentaNYL  (SUBLIMAZE ) injection 50 mcg (has no administration in time range)  lactated ringers  bolus 1,000 mL (1,000 mLs Intravenous New Bag/Given 11/16/23 2332)  ondansetron  (ZOFRAN ) injection 4 mg (4 mg Intravenous Given 11/16/23 2332)                                    Medical Decision Making Amount and/or Complexity of Data Reviewed Labs: ordered. Decision-making details documented in ED Course. Radiology: ordered and independent interpretation performed. Decision-making details documented in ED Course. ECG/medicine tests: ordered and independent interpretation performed. Decision-making details documented in ED Course.  Risk Prescription drug management. Decision regarding hospitalization.   Abdominal pain with nausea and vomiting onset this afternoon.  He is tachycardic and hypotensive on arrival.  Mild hypoxia as well.  Denies shortness of breath or fever.  Abdomen soft with tenderness to left lower quadrant.  He is given IV fluids, pain and nausea medications Lactate is normal but white blood cell count is elevated.  Remains tachycardic and hypotensive.  Additional IV fluids are given.  Urinalysis is negative for infection.  Does have leukocytosis with normal lactate.  Chest x-ray is negative for pneumonia or pneumothorax.  Troponin mildly elevated.  Creatinine is at baseline.  Urinalysis is negative.  CT scan is obtained and shows no acute surgical pathology.  Does show rib fracture as well as L2 spinous process fracture and atelectasis.  Will treat for possible pneumonia with antibiotics.  Given his ongoing tachycardia and hypoxia will plan  admission.  [place] flat.  Denies chest pain.  Heart rate improved to the 90s.  Blood pressure 90s as well.  Additional IV fluids given.  Query whether he has possible pneumonia at site of rib fracture.  No PE.  Abdominal CT scan negative for acute pathology.  Denies chest pain or shortness of breath.  No vomiting throughout ED course.  Symptoms may be secondary to eating salad yesterday.  Admission for ongoing tachycardia and hypotension discussed with Dr. Alfornia.     Final diagnoses:  Nausea and vomiting, unspecified vomiting type  Hypotension, unspecified hypotension type    ED Discharge Orders     None          Neala Miggins, Garnette, MD 11/17/23 [phone]

## 2023-11-17 ENCOUNTER — Encounter (HOSPITAL_COMMUNITY): Payer: Self-pay

## 2023-11-17 ENCOUNTER — Other Ambulatory Visit: Payer: Self-pay

## 2023-11-17 ENCOUNTER — Observation Stay (HOSPITAL_COMMUNITY)

## 2023-11-17 ENCOUNTER — Emergency Department (HOSPITAL_COMMUNITY)

## 2023-11-17 DIAGNOSIS — R112 Nausea with vomiting, unspecified: Secondary | ICD-10-CM | POA: Diagnosis present

## 2023-11-17 DIAGNOSIS — R6510 Systemic inflammatory response syndrome (SIRS) of non-infectious origin without acute organ dysfunction: Secondary | ICD-10-CM

## 2023-11-17 DIAGNOSIS — R1084 Generalized abdominal pain: Secondary | ICD-10-CM | POA: Diagnosis not present

## 2023-11-17 DIAGNOSIS — I4819 Other persistent atrial fibrillation: Secondary | ICD-10-CM

## 2023-11-17 DIAGNOSIS — M109 Gout, unspecified: Secondary | ICD-10-CM

## 2023-11-17 DIAGNOSIS — S2239XA Fracture of one rib, unspecified side, initial encounter for closed fracture: Secondary | ICD-10-CM

## 2023-11-17 DIAGNOSIS — D696 Thrombocytopenia, unspecified: Secondary | ICD-10-CM

## 2023-11-17 DIAGNOSIS — S2231XA Fracture of one rib, right side, initial encounter for closed fracture: Secondary | ICD-10-CM

## 2023-11-17 LAB — CBC WITH DIFFERENTIAL/PLATELET
Abs Immature Granulocytes: 0.16 K/uL — ABNORMAL HIGH (ref 0.00–0.07)
Basophils Absolute: 0 K/uL (ref 0.0–0.1)
Basophils Relative: 0 %
Eosinophils Absolute: 0 K/uL (ref 0.0–0.5)
Eosinophils Relative: 0 %
HCT: 39.8 % (ref 39.0–52.0)
Hemoglobin: 13.2 g/dL (ref 13.0–17.0)
Immature Granulocytes: 1 %
Lymphocytes Relative: 2 %
Lymphs Abs: 0.2 K/uL — ABNORMAL LOW (ref 0.7–4.0)
MCH: 29.6 pg (ref 26.0–34.0)
MCHC: 33.2 g/dL (ref 30.0–36.0)
MCV: 89.2 fL (ref 80.0–100.0)
Monocytes Absolute: 0.4 K/uL (ref 0.1–1.0)
Monocytes Relative: 3 %
Neutro Abs: 10.7 K/uL — ABNORMAL HIGH (ref 1.7–7.7)
Neutrophils Relative %: 94 %
Platelets: 129 K/uL — ABNORMAL LOW (ref 150–400)
RBC: 4.46 MIL/uL (ref 4.22–5.81)
RDW: 14.6 % (ref 11.5–15.5)
WBC: 11.5 K/uL — ABNORMAL HIGH (ref 4.0–10.5)
nRBC: 0 % (ref 0.0–0.2)

## 2023-11-17 LAB — URINALYSIS, ROUTINE W REFLEX MICROSCOPIC
Bacteria, UA: NONE SEEN
Bilirubin Urine: NEGATIVE
Glucose, UA: >=500 mg/dL — AB
Hgb urine dipstick: NEGATIVE
Ketones, ur: NEGATIVE mg/dL
Leukocytes,Ua: NEGATIVE
Nitrite: NEGATIVE
Protein, ur: NEGATIVE mg/dL
Specific Gravity, Urine: 1.023 (ref 1.005–1.030)
pH: 5 (ref 5.0–8.0)

## 2023-11-17 LAB — COMPREHENSIVE METABOLIC PANEL WITH GFR
ALT: 39 U/L (ref 0–44)
ALT: 38 U/L (ref 0–44)
AST: 26 U/L (ref 15–41)
AST: 26 U/L (ref 15–41)
Albumin: 3.5 g/dL (ref 3.5–5.0)
Albumin: 3.2 g/dL — ABNORMAL LOW (ref 3.5–5.0)
Alkaline Phosphatase: 78 U/L (ref 38–126)
Alkaline Phosphatase: 80 U/L (ref 38–126)
Anion gap: 10 (ref 5–15)
Anion gap: 12 (ref 5–15)
BUN: 37 mg/dL — ABNORMAL HIGH (ref 8–23)
BUN: 43 mg/dL — ABNORMAL HIGH (ref 8–23)
CO2: 23 mmol/L (ref 22–32)
CO2: 20 mmol/L — ABNORMAL LOW (ref 22–32)
Calcium: 8.9 mg/dL (ref 8.9–10.3)
Calcium: 8.4 mg/dL — ABNORMAL LOW (ref 8.9–10.3)
Chloride: 102 mmol/L (ref 98–111)
Chloride: 101 mmol/L (ref 98–111)
Creatinine, Ser: 1.2 mg/dL (ref 0.61–1.24)
Creatinine, Ser: 1.61 mg/dL — ABNORMAL HIGH (ref 0.61–1.24)
GFR, Estimated: >60 mL/min (ref >60)
GFR, Estimated: 42 mL/min — ABNORMAL LOW (ref >60)
Glucose, Bld: 225 mg/dL — ABNORMAL HIGH (ref 70–99)
Glucose, Bld: 193 mg/dL — ABNORMAL HIGH (ref 70–99)
Potassium: 3.5 mmol/L (ref 3.5–5.1)
Potassium: 3.8 mmol/L (ref 3.5–5.1)
Sodium: 135 mmol/L (ref 135–145)
Sodium: 133 mmol/L — ABNORMAL LOW (ref 135–145)
Total Bilirubin: 3 mg/dL — ABNORMAL HIGH (ref 0.0–1.2)
Total Bilirubin: 2.5 mg/dL — ABNORMAL HIGH (ref 0.0–1.2)
Total Protein: 6.1 g/dL — ABNORMAL LOW (ref 6.5–8.1)
Total Protein: 6.3 g/dL — ABNORMAL LOW (ref 6.5–8.1)

## 2023-11-17 LAB — GLUCOSE, CAPILLARY
Glucose-Capillary: 178 mg/dL — ABNORMAL HIGH (ref 70–99)
Glucose-Capillary: 188 mg/dL — ABNORMAL HIGH (ref 70–99)
Glucose-Capillary: 213 mg/dL — ABNORMAL HIGH (ref 70–99)

## 2023-11-17 LAB — RESP PANEL BY RT-PCR (RSV, FLU A&B, COVID)  RVPGX2
Influenza A by PCR: NEGATIVE
Influenza B by PCR: NEGATIVE
Resp Syncytial Virus by PCR: NEGATIVE
SARS Coronavirus 2 by RT PCR: NEGATIVE

## 2023-11-17 LAB — TROPONIN I (HIGH SENSITIVITY)
Troponin I (High Sensitivity): 20 ng/L — ABNORMAL HIGH (ref <18)
Troponin I (High Sensitivity): 21 ng/L — ABNORMAL HIGH (ref <18)

## 2023-11-17 LAB — CBG MONITORING, ED
Glucose-Capillary: 210 mg/dL — ABNORMAL HIGH (ref 70–99)
Glucose-Capillary: 186 mg/dL — ABNORMAL HIGH (ref 70–99)

## 2023-11-17 LAB — LIPASE, BLOOD: Lipase: 32 U/L (ref 11–51)

## 2023-11-17 MED ORDER — CARVEDILOL 12.5 MG PO TABS
12.5000 mg | ORAL_TABLET | Freq: Two times a day (BID) | ORAL | Status: DC
  Filled 2023-11-17: qty 1

## 2023-11-17 MED ORDER — NALOXONE HCL 0.4 MG/ML IJ SOLN: 0.4000 mg | INTRAMUSCULAR | Status: DC | PRN

## 2023-11-17 MED ORDER — ENOXAPARIN SODIUM 40 MG/0.4ML IJ SOSY
40.0000 mg | PREFILLED_SYRINGE | Freq: Every day | INTRAMUSCULAR | Status: DC
  Administered 2023-11-17: 40 mg via SUBCUTANEOUS
  Filled 2023-11-17: qty 0.4

## 2023-11-17 MED ORDER — IOHEXOL 350 MG/ML SOLN
100.0000 mL | Freq: Once | INTRAVENOUS | Status: AC | PRN
  Administered 2023-11-17: 100 mL via INTRAVENOUS

## 2023-11-17 MED ORDER — LACTATED RINGERS IV BOLUS
1000.0000 mL | Freq: Once | INTRAVENOUS | Status: AC
  Administered 2023-11-17: 1000 mL via INTRAVENOUS

## 2023-11-17 MED ORDER — ACETAMINOPHEN 325 MG PO TABS
650.0000 mg | ORAL_TABLET | Freq: Four times a day (QID) | ORAL | Status: DC | PRN
  Administered 2023-11-17 – 2023-11-18 (×2): 650 mg via ORAL
  Filled 2023-11-17 (×2): qty 2

## 2023-11-17 MED ORDER — SODIUM CHLORIDE 0.9 % IV SOLN
1.0000 g | Freq: Once | INTRAVENOUS | Status: AC
  Administered 2023-11-17: 1 g via INTRAVENOUS
  Filled 2023-11-17: qty 10

## 2023-11-17 MED ORDER — LEVALBUTEROL HCL 0.63 MG/3ML IN NEBU
0.6300 mg | INHALATION_SOLUTION | Freq: Four times a day (QID) | RESPIRATORY_TRACT | Status: DC | PRN
  Administered 2023-11-19 – 2023-11-21 (×4): 0.63 mg via RESPIRATORY_TRACT
  Filled 2023-11-17 (×4): qty 3

## 2023-11-17 MED ORDER — ALLOPURINOL 100 MG PO TABS
100.0000 mg | ORAL_TABLET | Freq: Every day | ORAL | Status: DC
  Administered 2023-11-17 – 2023-11-19 (×3): 100 mg via ORAL
  Filled 2023-11-17 (×3): qty 1

## 2023-11-17 MED ORDER — METOPROLOL TARTRATE 5 MG/5ML IV SOLN
5.0000 mg | INTRAVENOUS | Status: DC | PRN
  Administered 2023-11-17: 5 mg via INTRAVENOUS
  Filled 2023-11-17: qty 5

## 2023-11-17 MED ORDER — INSULIN ASPART 100 UNIT/ML IJ SOLN
0.0000 [IU] | Freq: Three times a day (TID) | INTRAMUSCULAR | Status: DC
  Administered 2023-11-17 (×2): 2 [IU] via SUBCUTANEOUS
  Administered 2023-11-17: 3 [IU] via SUBCUTANEOUS
  Administered 2023-11-18: 1 [IU] via SUBCUTANEOUS
  Administered 2023-11-18 – 2023-11-20 (×5): 2 [IU] via SUBCUTANEOUS
  Administered 2023-11-20: 3 [IU] via SUBCUTANEOUS
  Administered 2023-11-20 – 2023-11-21 (×3): 2 [IU] via SUBCUTANEOUS
  Administered 2023-11-21: 3 [IU] via SUBCUTANEOUS
  Filled 2023-11-17: qty 0.09

## 2023-11-17 MED ORDER — INSULIN ASPART 100 UNIT/ML IJ SOLN
0.0000 [IU] | Freq: Every day | INTRAMUSCULAR | Status: DC
  Administered 2023-11-17 – 2023-11-18 (×2): 2 [IU] via SUBCUTANEOUS
  Filled 2023-11-17: qty 0.05

## 2023-11-17 MED ORDER — IPRATROPIUM-ALBUTEROL 0.5-2.5 (3) MG/3ML IN SOLN
3.0000 mL | Freq: Once | RESPIRATORY_TRACT | Status: DC
  Filled 2023-11-17: qty 3

## 2023-11-17 MED ORDER — ONDANSETRON HCL 4 MG/2ML IJ SOLN
4.0000 mg | Freq: Once | INTRAMUSCULAR | Status: AC
  Administered 2023-11-18: 4 mg via INTRAVENOUS
  Filled 2023-11-17: qty 2

## 2023-11-17 MED ORDER — SODIUM CHLORIDE 0.9 % IV SOLN
500.0000 mg | Freq: Once | INTRAVENOUS | Status: AC
  Administered 2023-11-17: 500 mg via INTRAVENOUS
  Filled 2023-11-17: qty 5

## 2023-11-17 MED ORDER — OXYCODONE HCL 5 MG PO TABS
5.0000 mg | ORAL_TABLET | Freq: Four times a day (QID) | ORAL | Status: DC | PRN
  Administered 2023-11-18 – 2023-11-20 (×3): 5 mg via ORAL
  Filled 2023-11-17 (×3): qty 1

## 2023-11-17 MED ORDER — ACETAMINOPHEN 650 MG RE SUPP: 650.0000 mg | Freq: Four times a day (QID) | RECTAL | Status: DC | PRN

## 2023-11-17 NOTE — ED Notes (Signed)
 MD made aware of pt's status and his oxygen levels and work of breathing. Discharge orders discontinued by provider

## 2023-11-17 NOTE — Evaluation (Signed)
 Occupational Therapy Evaluation Patient Details Name: [name] MRN: [id number] DOB: 01-18-1941 Today's Date: 11/17/2023   History of Present Illness   [name] is a 83 y.o. 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     Clinical Impressions Pt is at baseline Ind level of function with ADLs and ADL mobility; no ADs required with mild balance deficits, VSS. observed. PTA pt lives with his wife and was driving, has a shower seat that he is not using at home. Recommended to pt and his wife that pt use shower seat initially once he returns home for safety; pt verbalized understanding. All education completed and no further acute OT services are indicated at this time, OT will sign off     If plan is discharge home, recommend the following:   Assist for transportation;Help with stairs or ramp for entrance     Functional Status Assessment   Patient has not had a recent decline in their functional status     Equipment Recommendations   Tub/shower seat     Recommendations for Other Services         Precautions/Restrictions   Precautions Precautions: None Restrictions Weight Bearing Restrictions Per Provider Order: No     Mobility Bed Mobility Overal bed mobility: Independent                  Transfers Overall transfer level: Independent Equipment used: 1 person hand held assist, None                      Balance Overall balance assessment: Mild deficits observed, not formally tested                                         ADL either performed or assessed with clinical judgement   ADL Overall ADL's : At baseline;Independent                                              Vision Baseline Vision/History: 1 Wears glasses Ability to See in Adequate Light: 0 Adequate Patient Visual Report: No change from baseline       Perception         Praxis         Pertinent Vitals/Pain Pain Assessment Pain Assessment: No/denies pain     Extremity/Trunk Assessment Upper Extremity Assessment Upper Extremity Assessment: Overall WFL for tasks assessed   Lower Extremity Assessment Lower Extremity Assessment: Defer to PT evaluation   Cervical / Trunk Assessment Cervical / Trunk Assessment: Normal   Communication Communication Communication: No apparent difficulties   Cognition Arousal: Alert Behavior During Therapy: WFL for tasks assessed/performed Cognition: No apparent impairments                               Following commands: Intact       Cueing  General Comments          Exercises     Shoulder Instructions      Home  Living Family/patient expects to be discharged to:: Private residence Living Arrangements: Spouse/significant other Available Help at Discharge: Family Type of Home: House Home Access: Stairs to enter Secretary/administrator of Steps: 2   Home Layout: One level     Bathroom Shower/Tub: Arts development officer Toilet: Handicapped height     Home Equipment: Agricultural consultant (2 wheels);Shower seat;Crutches;Grab bars - tub/shower;Adaptive equipment Adaptive Equipment: Reacher        Prior Functioning/Environment Prior Level of Function : Independent/Modified Independent;Driving             Mobility Comments: no AD ADLs Comments: Ind with ADLs/selfcare    OT Problem List: Decreased activity tolerance   OT Treatment/Interventions:        OT Goals(Current goals can be found in the care plan section)   Acute Rehab OT Goals Patient Stated Goal: go home OT Goal Formulation: All assessment and education complete, DC therapy   OT  Frequency:       Co-evaluation              AM-PAC OT 6 Clicks Daily Activity     Outcome Measure Help from another person eating meals?: None Help from another person taking care of personal grooming?: None Help from another person toileting, which includes using toliet, bedpan, or urinal?: None Help from another person bathing (including washing, rinsing, drying)?: None Help from another person to put on and taking off regular upper body clothing?: None Help from another person to put on and taking off regular lower body clothing?: None 6 Click Score: 24   End of Session Equipment Utilized During Treatment: Gait belt Nurse Communication: Mobility status  Activity Tolerance: Patient tolerated treatment well Patient left: in bed;with call bell/phone within reach;with family/visitor present  OT Visit Diagnosis: Unsteadiness on feet (R26.81);Muscle weakness (generalized) (M62.81)                Time: 8998-8974 OT Time Calculation (min): 24 min Charges:  OT General Charges $OT Visit: 1 Visit OT Evaluation $OT Eval Low Complexity: 1 Low OT Treatments $Therapeutic Activity: 8-22 mins    Jacques Karna Loose 11/17/2023, 12:18 PM

## 2023-11-17 NOTE — Progress Notes (Signed)
 Progress Note    [name]   FMW:[id number]  DOB: [birth year]  DOA: 11/16/2023     0 PCP: Brandon Charlie ORN, MD  Initial CC: abd pain, vomiting  Hospital Course: [name] is a 83 y.o. 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   Patient states he had a [place] salad at [employer] for dinner on 8/12.  The following day 8/13 he started having generalized abdominal pain and was passing a lot of gas.  He had a few episodes of vomiting at home which started after he tried taking Pepto-Bismol.  Reports temperature of 101 F at home and took Tylenol  prior to coming into the ED.  Patient states she just before EMS arrived, as he was trying to get up from his couch, his legs felt weak and he fell on his right side hitting his posterior ribs against the corner of a table and since then this area feels sore.  Denies head injury or loss of consciousness.  He reports history of chronic lumbar vertebral fracture for which he is seen by orthopedics and was told that no surgery was needed.  He is not having any pain in his back.    He was admitted for further workup.  CT abdomen/pelvis was negative for acute abnormalities.  Underlying old L2 transverse process fracture noted. CT angio chest negative for PE and showed acute mildly displaced posterior 11th right rib fracture.  Symptoms of nausea improved and he tolerated a diet well.   Assessment and Plan   Generalized abdominal pain, nausea, vomiting SIRS/ possible sepsis - Suspecting possible viral illness precipitating nausea/vomiting.  He had no complaints of diarrhea -If does develop diarrhea, will send for testing -UA negative for signs of infection -  unclear if any other source/infection at play at this time; initially patient doing well and plan was for home, but developed shaking and tachycardia with hypoxia in the afternoon, therefore d/c cancelled and further monitoring to continue  - Follow-up repeat BMP and CBC  Acute hypoxia - CT angio chest negative for PE; no obvious infiltrates or effusion - Known new right 11th rib fracture and does have some mild bibasilar atelectasis already seen on CT angio chest -Possible progression of atelectasis in setting of rib fracture - continue O2 for now and wean as able - obtain CXR   Mild thrombocytopenia No signs of bleeding.  Monitor CBC.   Acute mildly displaced posterior 11th right rib fracture - Secondary to mechanical fall at home prior to ED arrival.  Patient is reporting mild pain/soreness in this area.  - Continue pain management   L2 transverse process fracture on the right of uncertain chronicity Seen on CT.  Per conversation with the patient, it seems this might be a chronic finding and he is seen by orthopedics on an outpatient basis.  He denies any back pain at this time.   History of nonischemic cardiomyopathy Last echo done in September 2023 showing improvement of EF to 50 to 55%. - Resuming Coreg  in setting of developing elevated blood pressure and tachycardia this afternoon -Hold Lasix , lisinopril , spironolactone  for now but will resume as able  Paroxysmal A-fib/flutter Not on anticoagulation.   - coreg  resumed   Type 2 diabetes - Last A1c 7.4 in  September 2024, repeat ordered.  Placed on sensitive sliding scale insulin  ACHS.   Gout Continue allopurinol .   Hypertension - most meds on hold for now; was hypotensive initially   Interval History:  Initially doing well when seen this morning and abdominal pain had resolved along with nausea.  He also ate breakfast well and ambulated with OT. Tentative plan was for discharge but this afternoon he began to feel worse  again developing shaking and tachycardia.  Blood pressure also rebounded.  He then became hypoxic requiring 5 L oxygen. Discharge canceled to allow for further monitoring and workup if necessary.  Old records reviewed in assessment of this patient  Antimicrobials:   DVT prophylaxis:  enoxaparin  (LOVENOX ) injection 40 mg Start: 11/17/23 1000   Code Status:   Code Status: Full Code  Mobility Assessment (Last 72 Hours)     Mobility Assessment     Row Name 11/17/23 1214           What is the highest level of mobility based on the mobility assessment? Level 5 (Ambulates independently) - Balance while walking independently - Complete          Barriers to discharge: None Disposition Plan: Home HH orders placed: N/A Status is: Observation  Objective: Blood pressure (!) 133/44, pulse (!) 130, temperature 98.8 F (37.1 C), temperature source Oral, resp. rate (!) 23, height 5' 10 (1.778 m), weight 81.6 kg, SpO2 (!) 84%.  Examination:  Physical Exam Constitutional:      General: He is not in acute distress.    Appearance: Normal appearance.  HENT:     Head: Normocephalic and atraumatic.     Mouth/Throat:     Mouth: Mucous membranes are moist.  Eyes:     Extraocular Movements: Extraocular movements intact.  Cardiovascular:     Rate and Rhythm: Normal rate and regular rhythm.  Pulmonary:     Effort: Pulmonary effort is normal. No respiratory distress.     Breath sounds: Normal breath sounds. No wheezing.     Comments: Mild abrasion along lower right back Abdominal:     General: Bowel sounds are normal. There is no distension.     Palpations: Abdomen is soft.     Tenderness: There is no abdominal tenderness.  Musculoskeletal:        General: Normal range of motion.     Cervical back: Normal range of motion and neck supple.  Skin:    General: Skin is warm and dry.  Neurological:     General: No focal deficit present.     Mental Status: He is alert.  Psychiatric:         Mood and Affect: Mood normal.        Behavior: Behavior normal.      Consultants:    Procedures:    Data Reviewed: Results for orders placed or performed during the hospital encounter of 11/16/23 (from the past 24 hours)  CBG monitoring, ED     Status: Abnormal   Collection Time: 11/16/23  9:36 PM  Result Value Ref Range   Glucose-Capillary 228 (H) 70 - 99 mg/dL  Urinalysis, Routine w reflex microscopic -Urine, Clean Catch     Status: Abnormal   Collection Time: 11/16/23 11:05 PM  Result Value Ref Range   Color, Urine YELLOW YELLOW   APPearance CLEAR CLEAR   Specific Gravity, Urine 1.023 1.005 - 1.030   pH 5.0 5.0 - 8.0   Glucose, UA >=500 (A) NEGATIVE mg/dL   Hgb urine  dipstick NEGATIVE NEGATIVE   Bilirubin Urine NEGATIVE NEGATIVE   Ketones, ur NEGATIVE NEGATIVE mg/dL   Protein, ur NEGATIVE NEGATIVE mg/dL   Nitrite NEGATIVE NEGATIVE   Leukocytes,Ua NEGATIVE NEGATIVE   RBC / HPF 0-5 0 - 5 RBC/hpf   WBC, UA 0-5 0 - 5 WBC/hpf   Bacteria, UA NONE SEEN NONE SEEN   Squamous Epithelial / HPF 0-5 0 - 5 /HPF  CBC with Differential     Status: Abnormal   Collection Time: 11/16/23 11:25 PM  Result Value Ref Range   WBC 17.3 (H) 4.0 - 10.5 K/uL   RBC 4.66 4.22 - 5.81 MIL/uL   Hemoglobin 13.4 13.0 - 17.0 g/dL   HCT 58.9 60.9 - 47.9 %   MCV 88.0 80.0 - 100.0 fL   MCH 28.8 26.0 - 34.0 pg   MCHC 32.7 30.0 - 36.0 g/dL   RDW 85.3 88.4 - 84.4 %   Platelets 132 (L) 150 - 400 K/uL   nRBC 0.0 0.0 - 0.2 %   Neutrophils Relative % 88 %   Neutro Abs 15.3 (H) 1.7 - 7.7 K/uL   Lymphocytes Relative 1 %   Lymphs Abs 0.2 (L) 0.7 - 4.0 K/uL   Monocytes Relative 9 %   Monocytes Absolute 1.5 (H) 0.1 - 1.0 K/uL   Eosinophils Relative 0 %   Eosinophils Absolute 0.0 0.0 - 0.5 K/uL   Basophils Relative 0 %   Basophils Absolute 0.0 0.0 - 0.1 K/uL   Immature Granulocytes 2 %   Abs Immature Granulocytes 0.26 (H) 0.00 - 0.07 K/uL  Comprehensive metabolic panel     Status: Abnormal   Collection  Time: 11/16/23 11:25 PM  Result Value Ref Range   Sodium 135 135 - 145 mmol/L   Potassium 3.5 3.5 - 5.1 mmol/L   Chloride 102 98 - 111 mmol/L   CO2 23 22 - 32 mmol/L   Glucose, Bld 225 (H) 70 - 99 mg/dL   BUN 37 (H) 8 - 23 mg/dL   Creatinine, Ser 8.79 0.61 - 1.24 mg/dL   Calcium  8.9 8.9 - 10.3 mg/dL   Total Protein 6.1 (L) 6.5 - 8.1 g/dL   Albumin 3.5 3.5 - 5.0 g/dL   AST 26 15 - 41 U/L   ALT 39 0 - 44 U/L   Alkaline Phosphatase 78 38 - 126 U/L   Total Bilirubin 3.0 (H) 0.0 - 1.2 mg/dL   GFR, Estimated >39 >39 mL/min   Anion gap 10 5 - 15  Lipase, blood     Status: None   Collection Time: 11/16/23 11:25 PM  Result Value Ref Range   Lipase 32 11 - 51 U/L  Troponin I (High Sensitivity)     Status: Abnormal   Collection Time: 11/16/23 11:25 PM  Result Value Ref Range   Troponin I (High Sensitivity) 20 (H) <18 ng/L  Lactic acid, plasma     Status: None   Collection Time: 11/16/23 11:25 PM  Result Value Ref Range   Lactic Acid, Venous 1.5 0.5 - 1.9 mmol/L  Troponin I (High Sensitivity)     Status: Abnormal   Collection Time: 11/17/23  1:06 AM  Result Value Ref Range   Troponin I (High Sensitivity) 21 (H) <18 ng/L  Resp panel by RT-PCR (RSV, Flu A&B, Covid) Anterior Nasal Swab     Status: None   Collection Time: 11/17/23  6:53 AM   Specimen: Anterior Nasal Swab  Result Value Ref Range   SARS Coronavirus 2  by RT PCR NEGATIVE NEGATIVE   Influenza A by PCR NEGATIVE NEGATIVE   Influenza B by PCR NEGATIVE NEGATIVE   Resp Syncytial Virus by PCR NEGATIVE NEGATIVE  CBG monitoring, ED     Status: Abnormal   Collection Time: 11/17/23  8:34 AM  Result Value Ref Range   Glucose-Capillary 210 (H) 70 - 99 mg/dL  CBG monitoring, ED     Status: Abnormal   Collection Time: 11/17/23 12:41 PM  Result Value Ref Range   Glucose-Capillary 186 (H) 70 - 99 mg/dL    I have reviewed pertinent nursing notes, vitals, labs, and images as necessary. I have ordered labwork to follow up on as  indicated.  I have reviewed the last notes from staff over past 24 hours. I have discussed patient's care plan and test results with nursing staff, CM/SW, and other staff as appropriate.  Time spent: Greater than 50% of the 55 minute visit was spent in counseling/coordination of care for the patient as laid out in the A&P.   LOS: 0 days   Alm Apo, MD [facility] 11/17/2023, 1:26 PM

## 2023-11-17 NOTE — H&P (Signed)
 History and Physical    [name] FMW:[id number] DOB: 05/03/1940 DOA: 11/16/2023  PCP: Vernadine Charlie ORN, MD  Patient coming from: Home  Chief Complaint: Abdominal pain, vomiting  HPI: [name] is a 83 y.o. 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  Patient states he had a [place] salad at [employer] for dinner on 8/12.  The following day 8/13 he started having generalized abdominal pain and was passing a lot of gas.  He had a few episodes of vomiting at home which started after he tried taking Pepto-Bismol.  Reports temperature of 101 F at home and took Tylenol  prior to coming into the ED.  Patient states she just before EMS arrived, as he was trying to get up from his couch, his legs felt weak and he fell on his right side hitting his posterior ribs against the corner of a table and since then this area feels sore.  Denies head injury or loss of consciousness.  He reports history of chronic lumbar vertebral fracture for which he is seen by orthopedics and was told that no surgery was needed.  He is not having any pain in his back.  Vomiting has now stopped and he has been able to eat half a sandwich and drink fluids in the ED.  Patient does mention that last week his daughter brought home a new puppy who was having diarrhea and was diagnosed with Giardia infection.  Although he himself has not had any diarrhea.  Denies cough, shortness of breath, or chest pain.  ED Course: Tachycardic to the 110s and mildly hypotensive with systolic in the 90s.  Afebrile.  Labs notable for WBC count 17.3, platelet count 132k, glucose 225, BUN 37, creatinine 1.2 (stable), T. bili 3.0, transaminases and alkaline  phosphatase normal, lipase normal, UA not suggestive of infection, troponin 20> 21, lactic acid 1.5, blood cultures in process.  CT abdomen pelvis showing fracture of the L2 transverse process on the right of uncertain chronicity and no other focal abnormality.  CT angiogram chest negative for PE.  Showing acute mildly displaced posterior 11th right rib fracture.  Patient was given Zofran , ceftriaxone , azithromycin , and 2 L LR.  Review of Systems:  Review of Systems  All other systems reviewed and are negative.   Past Medical History:  Diagnosis Date   Aortic stenosis/ bicuspid valve--s/p AVR    Ascending aortic aneurysm (HCC)    Atrial fibrillation/flutter    Biventricular implantable cardiac defibrillator- BSX    Cholelithiases    Diabetes mellitus without complication (HCC)    DJD (degenerative joint disease)    Hypertension    LBBB (left bundle branch block)    Non-ischemic cardiomyopathy (HCC)    OSA (obstructive sleep apnea)     Past Surgical History:  Procedure Laterality Date   AORTIC VALVE REPLACEMENT     APPENDECTOMY     CARDIOVERSION N/A 05/18/2021   Procedure: CARDIOVERSION;  Surgeon: Raford Riggs, MD;  Location: [facility] ENDOSCOPY;  Service: Cardiovascular;  Laterality: N/A;   CERVICAL DISCECTOMY  5/01   C7   CHOLECYSTECTOMY     EP IMPLANTABLE DEVICE N/A 11/20/2014   Procedure:  ICD Generator Changeout;  Surgeon: Elspeth JAYSON Sage, MD;  Location: [facility] INVASIVE CV LAB;  Service: Cardiovascular;  Laterality: N/A;  MEDIAN STERNOTOMY     TONSILLECTOMY       reports that he has quit smoking. He has never used smokeless tobacco. He reports that he does not drink alcohol and does not use drugs.  No Known Allergies  Family History  Problem Relation Age of Onset   Emphysema Father    Leukemia Mother    Hypertension Maternal Grandmother    Stroke Maternal Grandmother    Congestive Heart Failure Sister    Diabetes Brother    Alzheimer's disease Brother     Prior to  Admission medications   Medication Sig Start Date End Date Taking? Authorizing Provider  allopurinol  (ZYLOPRIM ) 100 MG tablet Take 1 tablet (100 mg total) by mouth daily. 08/04/23  Yes   aspirin  81 MG chewable tablet Chew 81 mg by mouth daily.   Yes [provider]  carvedilol  (COREG ) 12.5 MG tablet Take 1 tablet (12.5 mg total) by mouth 2 (two) times daily with a meal. 01/25/23  Yes Crenshaw, Redell RAMAN, MD  dapagliflozin  propanediol (FARXIGA ) 10 MG TABS tablet Take 1 tablet (10 mg total) by mouth daily. 09/08/23  Yes   furosemide  (LASIX ) 40 MG tablet Take 1 tablet (40 mg total) by mouth daily. Take an extra tablet as needed for swelling or shortness of breath. 07/14/23  Yes Pietro Redell RAMAN, MD  ibuprofen  (ADVIL ) 200 MG tablet Take 200 mg by mouth every 6 (six) hours as needed.   Yes [provider]  lisinopril  (ZESTRIL ) 40 MG tablet Take 1 tablet (40 mg total) by mouth daily. 11/26/22  Yes   Semaglutide , 2 MG/DOSE, (OZEMPIC , 2 MG/DOSE,) 8 MG/3ML SOPN Inject 2 mg into the skin once a week. 04/07/23  Yes   spironolactone  (ALDACTONE ) 25 MG tablet Take 0.5 tablets (12.5 mg total) by mouth daily. 10/17/23 10/16/24 Yes Pietro Redell RAMAN, MD  albuterol  (VENTOLIN  HFA) 108 (90 Base) MCG/ACT inhaler Inhale 2 puffs into the lungs every 6 (six) hours as needed for wheezing or shortness of breath. 12/16/22   Pokhrel, Laxman, MD  glucose blood (ONETOUCH VERIO) test strip Use to test blood sugar once a day. 10/21/22     mupirocin  ointment (BACTROBAN ) 2 % Apply 1 Application topically 2 (two) times daily. To affected area til better Patient not taking: Reported on 11/17/2023 03/04/23   Vonna Sharlet POUR, MD    Physical Exam: Vitals:   11/17/23 0250 11/17/23 0315 11/17/23 0415 11/17/23 0500  BP: 95/63 116/60 97/64 (!) 100/59  Pulse: 77 74 74 75  Resp: 16  14 14   Temp: 98 F (36.7 C)     TempSrc: Oral     SpO2: 95% 95% 93% 92%  Weight:      Height:        Physical Exam Vitals reviewed.   Constitutional:      General: He is not in acute distress. HENT:     Head: Normocephalic and atraumatic.  Eyes:     Extraocular Movements: Extraocular movements intact.  Cardiovascular:     Rate and Rhythm: Normal rate and regular rhythm.     Pulses: Normal pulses.  Pulmonary:     Effort: Pulmonary effort is normal. No respiratory distress.     Breath sounds: Normal breath sounds. No wheezing, rhonchi or rales.  Abdominal:     General: Bowel sounds are normal. There is distension.     Palpations: Abdomen is soft.     Tenderness: There is no abdominal tenderness. There is no guarding or rebound.  Musculoskeletal:  Cervical back: Normal range of motion.     Right lower leg: No edema.     Left lower leg: No edema.  Skin:    General: Skin is warm and dry.  Neurological:     General: No focal deficit present.     Mental Status: He is alert and oriented to person, place, and time.     Cranial Nerves: No cranial nerve deficit.     Sensory: No sensory deficit.     Motor: No weakness.     Labs on Admission: I have personally reviewed following labs and imaging studies  CBC: Recent Labs  Lab 11/16/23 2325  WBC 17.3*  NEUTROABS 15.3*  HGB 13.4  HCT 41.0  MCV 88.0  PLT 132*   Basic Metabolic Panel: Recent Labs  Lab 11/16/23 2325  NA 135  K 3.5  CL 102  CO2 23  GLUCOSE 225*  BUN 37*  CREATININE 1.20  CALCIUM  8.9   GFR: Estimated Creatinine Clearance: 48.2 mL/min (by C-G formula based on SCr of 1.2 mg/dL). Liver Function Tests: Recent Labs  Lab 11/16/23 2325  AST 26  ALT 39  ALKPHOS 78  BILITOT 3.0*  PROT 6.1*  ALBUMIN 3.5   Recent Labs  Lab 11/16/23 2325  LIPASE 32   No results for input(s): AMMONIA in the last 168 hours. Coagulation Profile: No results for input(s): INR, PROTIME in the last 168 hours. Cardiac Enzymes: No results for input(s): CKTOTAL, CKMB, CKMBINDEX, TROPONINI in the last 168 hours. BNP (last 3 results) No  results for input(s): PROBNP in the last 8760 hours. HbA1C: No results for input(s): HGBA1C in the last 72 hours. CBG: Recent Labs  Lab 11/16/23 2136  GLUCAP 228*   Lipid Profile: No results for input(s): CHOL, HDL, LDLCALC, TRIG, CHOLHDL, LDLDIRECT in the last 72 hours. Thyroid Function Tests: No results for input(s): TSH, T4TOTAL, FREET4, T3FREE, THYROIDAB in the last 72 hours. Anemia Panel: No results for input(s): VITAMINB12, FOLATE, FERRITIN, TIBC, IRON, RETICCTPCT in the last 72 hours. Urine analysis:    Component Value Date/Time   COLORURINE YELLOW 11/16/2023 2305   APPEARANCEUR CLEAR 11/16/2023 2305   LABSPEC 1.023 11/16/2023 2305   PHURINE 5.0 11/16/2023 2305   GLUCOSEU >=500 (A) 11/16/2023 2305   HGBUR NEGATIVE 11/16/2023 2305   BILIRUBINUR NEGATIVE 11/16/2023 2305   KETONESUR NEGATIVE 11/16/2023 2305   PROTEINUR NEGATIVE 11/16/2023 2305   UROBILINOGEN 0.2 08/07/2007 0414   NITRITE NEGATIVE 11/16/2023 2305   LEUKOCYTESUR NEGATIVE 11/16/2023 2305    Radiological Exams on Admission: CT ABDOMEN PELVIS W CONTRAST Result Date: 11/17/2023 CLINICAL DATA:  Acute abdominal pain EXAM: CT ABDOMEN AND PELVIS WITH CONTRAST TECHNIQUE: Multidetector CT imaging of the abdomen and pelvis was performed using the standard protocol following bolus administration of intravenous contrast. RADIATION DOSE REDUCTION: This exam was performed according to the departmental dose-optimization program which includes automated exposure control, adjustment of the mA and/or kV according to patient size and/or use of iterative reconstruction technique. CONTRAST:  OMNIPAQUE  IOHEXOL  350 MG/ML SOLN COMPARISON:  Abdominal film from earlier in the same day. FINDINGS: Lower chest: Lung bases are free of acute infiltrate or sizable effusion. Hepatobiliary: Gallbladder has been surgically removed. Liver appears within normal limits. Pancreas: Unremarkable. No pancreatic  ductal dilatation or surrounding inflammatory changes. Spleen: Normal in size without focal abnormality. Adrenals/Urinary Tract: Adrenal glands are within normal limits. Kidneys demonstrate a normal enhancement pattern bilaterally. No renal calculi or obstructive changes are noted. Delayed images demonstrate no acute  abnormality. The ureters are within normal limits. The bladder is decompressed. Stomach/Bowel: No obstructive or inflammatory changes of the colon are seen. The appendix is not visualized consistent with prior surgical history. The small bowel and stomach are unremarkable. Vascular/Lymphatic: Aortic atherosclerosis. No enlarged abdominal or pelvic lymph nodes. Reproductive: Prostate is unremarkable. Other: No abdominal wall hernia or abnormality. No abdominopelvic ascites. Musculoskeletal: Fracture of the L2 transverse process on the right is noted of uncertain chronicity. Degenerative changes of the lumbar spine are noted. IMPRESSION: Fracture of the L2 transverse process on the right of uncertain chronicity. No other focal abnormality is noted. Electronically Signed   By: Oneil Devonshire M.D.   On: 11/17/2023 01:09   CT Angio Chest PE W and/or Wo Contrast Result Date: 11/17/2023 CLINICAL DATA:  Nausea and vomiting. EXAM: CT ANGIOGRAPHY CHEST WITH CONTRAST TECHNIQUE: Multidetector CT imaging of the chest was performed using the standard protocol during bolus administration of intravenous contrast. Multiplanar CT image reconstructions and MIPs were obtained to evaluate the vascular anatomy. RADIATION DOSE REDUCTION: This exam was performed according to the departmental dose-optimization program which includes automated exposure control, adjustment of the mA and/or kV according to patient size and/or use of iterative reconstruction technique. CONTRAST:  OMNIPAQUE  IOHEXOL  350 MG/ML SOLN COMPARISON:  None Available. FINDINGS: Cardiovascular: A dual lead AICD is in place. There is marked severity  calcification of the thoracic aorta. An artificial aortic valve is seen. Satisfactory opacification of the pulmonary arteries to the segmental level. No evidence of pulmonary embolism. Normal heart size with marked severity coronary artery calcification. No pericardial effusion. Mediastinum/Nodes: No enlarged mediastinal, hilar, or axillary lymph nodes. Thyroid gland, trachea, and esophagus demonstrate no significant findings. Lungs/Pleura: There is a 4 mm posterolateral right upper lobe calcified lung nodule (axial CT image 57, CT series 4). 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 Mild lingular and mild bibasilar atelectasis is seen. No pleural effusion or pneumothorax is identified. Upper Abdomen: Multiple surgical clips are seen within the gallbladder fossa. A mild amount of central pneumobilia is seen. This is decreased in severity when compared to the prior study. Musculoskeletal: Multiple sternal wires are present. Postoperative changes are seen within the visualized portion of the lower cervical spine. An acute, mildly displaced posterior eleventh right rib fracture is seen (axial CT images 136 through 139, CT series 4). Review of the MIP images confirms the above findings. IMPRESSION: 1. No evidence of pulmonary embolism. 2. Acute, mildly displaced posterior eleventh right rib fracture. 3. Mild lingular and mild bibasilar atelectasis. 4. Evidence of prior cholecystectomy. 5. Aortic atherosclerosis. Electronically Signed   By: Suzen Dials M.D.   On: 11/17/2023 01:07   DG Chest Portable 1 View Result Date: 11/17/2023 CLINICAL DATA:  Nausea and vomiting and abdominal pain, initial encounter EXAM: PORTABLE CHEST 1 VIEW COMPARISON:  12/15/2022 FINDINGS: Stable cardiomegaly is noted. Defibrillator is again seen and stable. Postsurgical changes in the cervical spine are noted. No focal infiltrate or effusion is seen. No acute bony abnormality is noted.  IMPRESSION: No acute abnormality noted. Electronically Signed   By: Oneil Devonshire M.D.   On: 11/17/2023 00:30   DG Abd Portable 1 View Result Date: 11/17/2023 CLINICAL DATA:  Nausea and vomiting and abdominal pain for 1 day, initial encounter EXAM: PORTABLE ABDOMEN - 1 VIEW COMPARISON:  12/13/2022 FINDINGS: Scattered large and small bowel gas is noted. No obstructive changes are seen. No free air is noted. No abnormal mass or abnormal calcifications  are noted. Degenerative change of the lumbar spine. IMPRESSION: No acute abnormality noted. Electronically Signed   By: Oneil Devonshire M.D.   On: 11/17/2023 00:29    EKG: Interpretation limited secondary to paced rhythm.  Assessment and Plan  Generalized abdominal pain, nausea, vomiting SIRS/ possible sepsis ?Food-borne illness as he had a [place] salad at [employer] the night prior to the onset of his symptoms.  He does mention his daughter bringing home a new puppy who was having diarrhea and was diagnosed with giardiasis but patient himself is not having any diarrhea.  Reports temperature of 101 F at home and took a Tylenol  prior to coming into the ED but has been afebrile since arrival to the ED.  WBC count 17.3.  No lactic acidosis.  Tachycardia and mild hypotension resolved after IV fluids.  SBP currently >100. T. bili 3.0, transaminases and alkaline phosphatase normal, lipase normal, UA not suggestive of infection.  CT negative for acute intra-abdominal finding. Symptoms have now improved and he was able to tolerate p.o. intake in the ED.  Continue symptomatic management.  Follow-up blood cultures and trend WBC count and LFTs.  Test for COVID and flu.  Mild thrombocytopenia No signs of bleeding.  Monitor CBC.  Acute mildly displaced posterior 11th right rib fracture Secondary to mechanical fall at home prior to ED arrival.  Patient is reporting mild pain/soreness in this area.  Continue pain management.  PT/OT eval, fall precautions.  L2  transverse process fracture on the right of uncertain chronicity Seen on CT.  Per conversation with the patient, it seems this might be a chronic finding and he is seen by orthopedics on an outpatient basis.  He denies any back pain at this time.  History of nonischemic cardiomyopathy Last echo done in September 2023 showing improvement of EF to 50 to 55%.  Holding Coreg , Lasix , lisinopril , and spironolactone  at this time to avoid hypotension.  Paroxysmal A-fib/flutter Not on anticoagulation.  Holding Coreg  at this time to avoid hypotension.  Type 2 diabetes Glucose in the 220s.  Last A1c 7.4 in September 2024, repeat ordered.  Placed on sensitive sliding scale insulin  ACHS.  Gout Continue allopurinol .  Hypertension Hold antihypertensives at this time to avoid hypotension.  DVT prophylaxis: Lovenox  Code Status: Full Code (discussed with the patient) Family Communication: No family available at this time. Level of care: Progressive Care Unit Admission status: It is my clinical opinion that referral for OBSERVATION is reasonable and necessary in this patient based on the above information provided. The aforementioned taken together are felt to place the patient at high risk for further clinical deterioration. However, it is anticipated that the patient may be medically stable for discharge from the hospital within 24 to 48 hours.  Editha Ram MD [facility]  If 7PM-7AM, please contact night-coverage [email or web address]  11/17/2023, 5:39 AM

## 2023-11-17 NOTE — ED Notes (Signed)
 Admitting provider made aware of pt decline and updated sets of vitals.

## 2023-11-17 NOTE — Discharge Summary (Signed)
 Physician Discharge Summary   [name] [facility] FMW:[id number] DOB: 1941/02/11 DOA: 11/16/2023  PCP: [name], Richard W, MD  Admit date: 11/16/2023 Discharge date: 11/17/2023  Admitted From: Home Disposition:  Home Discharging physician: Alm Apo, MD Barriers to discharge: none   Discharge Condition: stable CODE STATUS: Full  Diet recommendation:  Diet Orders (From admission, onward)     Start     Ordered   11/17/23 0648  Diet heart healthy/carb modified Room service appropriate? Yes; Fluid consistency: Thin  Diet effective now       Question Answer Comment  Diet-HS Snack? Nothing   Room service appropriate? Yes   Fluid consistency: Thin      11/17/23 0648   11/17/23 0000  Diet - low sodium heart healthy        11/17/23 1042            Hospital Course: [name] is a 83 y.o. 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   Patient states he had a [place] salad at [employer] for dinner on 8/12.  The following day 8/13 he started having generalized abdominal pain and was passing a lot of gas.  He had a few episodes of vomiting at home which started after he tried taking Pepto-Bismol.  Reports temperature of 101 F at home and took Tylenol  prior to coming into the ED.  Patient states she just before EMS arrived, as he was trying to get up from his couch, his legs felt weak and he fell on his right side hitting his posterior ribs against the corner of a table and since then this area feels sore.  Denies head injury or loss of consciousness.  He reports history of chronic lumbar vertebral fracture for which he is seen by orthopedics and was told that no surgery was needed.  He is not having  any pain in his back.    He was admitted for further workup.  CT abdomen/pelvis was negative for acute abnormalities.  Underlying old L2 transverse process fracture noted. CT angio chest negative for PE and showed acute mildly displaced posterior 11th right rib fracture.  Symptoms of nausea improved and he tolerated a diet well.  He had no complaints of diarrhea. He was considered medically stable for discharge.   The patient's acute and chronic medical conditions were treated accordingly. On day of discharge, patient was felt deemed stable for discharge. Patient/family member advised to call PCP or come back to ER if needed.   Principal Diagnosis: Generalized abdominal pain  Discharge Diagnoses: Active Hospital Problems   Diagnosis Date Noted   Rib fracture 11/17/2023    Priority: 3.   SIRS (systemic inflammatory response syndrome) (HCC) 11/17/2023   Thrombocytopenia (HCC) 11/17/2023   Gout 11/17/2023   Atrial fibrillation (HCC) 06/09/2010    Resolved Hospital Problems   Diagnosis Date Noted Date Resolved   Generalized abdominal pain 11/17/2023 11/17/2023    Priority: 1.   Nausea and vomiting 11/17/2023 11/17/2023    Priority: 1.     Discharge Instructions     Diet - low sodium heart healthy   Complete by: As directed    Increase activity slowly   Complete by: As directed       Allergies as of 11/17/2023   No Known  Allergies      Medication List     STOP taking these medications    mupirocin  ointment 2 % Commonly known as: BACTROBAN        TAKE these medications    allopurinol  100 MG tablet Commonly known as: ZYLOPRIM  Take 1 tablet (100 mg total) by mouth daily.   aspirin  81 MG chewable tablet Chew 81 mg by mouth daily.   carvedilol  12.5 MG tablet Commonly known as: COREG  Take 1 tablet (12.5 mg total) by mouth 2 (two) times daily with a meal.   Farxiga  10 MG Tabs tablet Generic drug: dapagliflozin  propanediol Take 1 tablet (10 mg total) by mouth  daily.   furosemide  40 MG tablet Commonly known as: LASIX  Take 1 tablet (40 mg total) by mouth daily. Take an extra tablet as needed for swelling or shortness of breath.   ibuprofen  200 MG tablet Commonly known as: ADVIL  Take 200 mg by mouth every 6 (six) hours as needed.   lisinopril  40 MG tablet Commonly known as: ZESTRIL  Take 1 tablet (40 mg total) by mouth daily.   OneTouch Verio test strip Generic drug: glucose blood Use to test blood sugar once a day.   Ozempic  (2 MG/DOSE) 8 MG/3ML Sopn Generic drug: Semaglutide  (2 MG/DOSE) Inject 2 mg into the skin once a week.   spironolactone  25 MG tablet Commonly known as: ALDACTONE  Take 0.5 tablets (12.5 mg total) by mouth daily.   Ventolin  HFA 108 (90 Base) MCG/ACT inhaler Generic drug: albuterol  Inhale 2 puffs into the lungs every 6 (six) hours as needed for wheezing or shortness of breath.        No Known Allergies  Consultations:   Procedures:   Discharge Exam: BP 101/63   Pulse 74   Temp 98.3 F (36.8 C)   Resp 16   Ht 5' 10 (1.778 m)   Wt 81.6 kg   SpO2 93%   BMI 25.83 kg/m  Physical Exam Constitutional:      General: He is not in acute distress.    Appearance: Normal appearance.  HENT:     Head: Normocephalic and atraumatic.     Mouth/Throat:     Mouth: Mucous membranes are moist.  Eyes:     Extraocular Movements: Extraocular movements intact.  Cardiovascular:     Rate and Rhythm: Normal rate and regular rhythm.  Pulmonary:     Effort: Pulmonary effort is normal. No respiratory distress.     Breath sounds: Normal breath sounds. No wheezing.     Comments: Mild abrasion along lower right back Abdominal:     General: Bowel sounds are normal. There is no distension.     Palpations: Abdomen is soft.     Tenderness: There is no abdominal tenderness.  Musculoskeletal:        General: Normal range of motion.     Cervical back: Normal range of motion and neck supple.  Skin:    General: Skin is  warm and dry.  Neurological:     General: No focal deficit present.     Mental Status: He is alert.  Psychiatric:        Mood and Affect: Mood normal.        Behavior: Behavior normal.      The results of significant diagnostics from this hospitalization (including imaging, microbiology, ancillary and laboratory) are listed below for reference.   Microbiology: Recent Results (from the past 240 hours)  Resp panel by RT-PCR (RSV, Flu A&B, Covid) Anterior Nasal Swab  Status: None   Collection Time: 11/17/23  6:53 AM   Specimen: Anterior Nasal Swab  Result Value Ref Range Status   SARS Coronavirus 2 by RT PCR NEGATIVE NEGATIVE Final    Comment: (NOTE) SARS-CoV-2 target nucleic acids are NOT DETECTED.  The SARS-CoV-2 RNA is generally detectable in upper respiratory specimens during the acute phase of infection. The lowest concentration of SARS-CoV-2 viral copies this assay can detect is 138 copies/mL. A negative result does not preclude SARS-Cov-2 infection and should not be used as the sole basis for treatment or other patient management decisions. A negative result may occur with  improper specimen collection/handling, submission of specimen other than nasopharyngeal swab, presence of viral mutation(s) within the areas targeted by this assay, and inadequate number of viral copies(<138 copies/mL). A negative result must be combined with clinical observations, patient history, and epidemiological information. The expected result is Negative.  Fact Sheet for Patients:  BloggerCourse.com  Fact Sheet for Healthcare Providers:  SeriousBroker.it  This test is no t yet approved or cleared by the [place]  FDA and  has been authorized for detection and/or diagnosis of SARS-CoV-2 by FDA under an Emergency Use Authorization (EUA). This EUA will remain  in effect (meaning this test can be used) for the duration of the COVID-19  declaration under Section 564(b)(1) of the Act, 21 U.S.C.section 360bbb-3(b)(1), unless the authorization is terminated  or revoked sooner.       Influenza A by PCR NEGATIVE NEGATIVE Final   Influenza B by PCR NEGATIVE NEGATIVE Final    Comment: (NOTE) The Xpert Xpress SARS-CoV-2/FLU/RSV plus assay is intended as an aid in the diagnosis of influenza from Nasopharyngeal swab specimens and should not be used as a sole basis for treatment. Nasal washings and aspirates are unacceptable for Xpert Xpress SARS-CoV-2/FLU/RSV testing.  Fact Sheet for Patients: BloggerCourse.com  Fact Sheet for Healthcare Providers: SeriousBroker.it  This test is not yet approved or cleared by the [place]  FDA and has been authorized for detection and/or diagnosis of SARS-CoV-2 by FDA under an Emergency Use Authorization (EUA). This EUA will remain in effect (meaning this test can be used) for the duration of the COVID-19 declaration under Section 564(b)(1) of the Act, 21 U.S.C. section 360bbb-3(b)(1), unless the authorization is terminated or revoked.     Resp Syncytial Virus by PCR NEGATIVE NEGATIVE Final    Comment: (NOTE) Fact Sheet for Patients: BloggerCourse.com  Fact Sheet for Healthcare Providers: SeriousBroker.it  This test is not yet approved or cleared by the [place]  FDA and has been authorized for detection and/or diagnosis of SARS-CoV-2 by FDA under an Emergency Use Authorization (EUA). This EUA will remain in effect (meaning this test can be used) for the duration of the COVID-19 declaration under Section 564(b)(1) of the Act, 21 U.S.C. section 360bbb-3(b)(1), unless the authorization is terminated or revoked.  Performed at [facility], [address] [place], [place] [address]      Labs: BNP (last 3 results) No results for input(s): BNP in the  last 8760 hours. Basic Metabolic Panel: Recent Labs  Lab 11/16/23 2325  NA 135  K 3.5  CL 102  CO2 23  GLUCOSE 225*  BUN 37*  CREATININE 1.20  CALCIUM  8.9   Liver Function Tests: Recent Labs  Lab 11/16/23 2325  AST 26  ALT 39  ALKPHOS 78  BILITOT 3.0*  PROT 6.1*  ALBUMIN 3.5   Recent Labs  Lab 11/16/23 2325  LIPASE 32   No  results for input(s): AMMONIA in the last 168 hours. CBC: Recent Labs  Lab 11/16/23 2325  WBC 17.3*  NEUTROABS 15.3*  HGB 13.4  HCT 41.0  MCV 88.0  PLT 132*   Cardiac Enzymes: No results for input(s): CKTOTAL, CKMB, CKMBINDEX, TROPONINI in the last 168 hours. BNP: Invalid input(s): POCBNP CBG: Recent Labs  Lab 11/16/23 2136 11/17/23 0834  GLUCAP 228* 210*   D-Dimer No results for input(s): DDIMER in the last 72 hours. Hgb A1c No results for input(s): HGBA1C in the last 72 hours. Lipid Profile No results for input(s): CHOL, HDL, LDLCALC, TRIG, CHOLHDL, LDLDIRECT in the last 72 hours. Thyroid function studies No results for input(s): TSH, T4TOTAL, T3FREE, THYROIDAB in the last 72 hours.  Invalid input(s): FREET3 Anemia work up No results for input(s): VITAMINB12, FOLATE, FERRITIN, TIBC, IRON, RETICCTPCT in the last 72 hours. Urinalysis    Component Value Date/Time   COLORURINE YELLOW 11/16/2023 2305   APPEARANCEUR CLEAR 11/16/2023 2305   LABSPEC 1.023 11/16/2023 2305   PHURINE 5.0 11/16/2023 2305   GLUCOSEU >=500 (A) 11/16/2023 2305   HGBUR NEGATIVE 11/16/2023 2305   BILIRUBINUR NEGATIVE 11/16/2023 2305   KETONESUR NEGATIVE 11/16/2023 2305   PROTEINUR NEGATIVE 11/16/2023 2305   UROBILINOGEN 0.2 08/07/2007 0414   NITRITE NEGATIVE 11/16/2023 2305   LEUKOCYTESUR NEGATIVE 11/16/2023 2305   Sepsis Labs Recent Labs  Lab 11/16/23 2325  WBC 17.3*   Microbiology Recent Results (from the past 240 hours)  Resp panel by RT-PCR (RSV, Flu A&B, Covid) Anterior Nasal Swab      Status: None   Collection Time: 11/17/23  6:53 AM   Specimen: Anterior Nasal Swab  Result Value Ref Range Status   SARS Coronavirus 2 by RT PCR NEGATIVE NEGATIVE Final    Comment: (NOTE) SARS-CoV-2 target nucleic acids are NOT DETECTED.  The SARS-CoV-2 RNA is generally detectable in upper respiratory specimens during the acute phase of infection. The lowest concentration of SARS-CoV-2 viral copies this assay can detect is 138 copies/mL. A negative result does not preclude SARS-Cov-2 infection and should not be used as the sole basis for treatment or other patient management decisions. A negative result may occur with  improper specimen collection/handling, submission of specimen other than nasopharyngeal swab, presence of viral mutation(s) within the areas targeted by this assay, and inadequate number of viral copies(<138 copies/mL). A negative result must be combined with clinical observations, patient history, and epidemiological information. The expected result is Negative.  Fact Sheet for Patients:  BloggerCourse.com  Fact Sheet for Healthcare Providers:  SeriousBroker.it  This test is no t yet approved or cleared by the [place]  FDA and  has been authorized for detection and/or diagnosis of SARS-CoV-2 by FDA under an Emergency Use Authorization (EUA). This EUA will remain  in effect (meaning this test can be used) for the duration of the COVID-19 declaration under Section 564(b)(1) of the Act, 21 U.S.C.section 360bbb-3(b)(1), unless the authorization is terminated  or revoked sooner.       Influenza A by PCR NEGATIVE NEGATIVE Final   Influenza B by PCR NEGATIVE NEGATIVE Final    Comment: (NOTE) The Xpert Xpress SARS-CoV-2/FLU/RSV plus assay is intended as an aid in the diagnosis of influenza from Nasopharyngeal swab specimens and should not be used as a sole basis for treatment. Nasal washings and aspirates are  unacceptable for Xpert Xpress SARS-CoV-2/FLU/RSV testing.  Fact Sheet for Patients: BloggerCourse.com  Fact Sheet for Healthcare Providers: SeriousBroker.it  This test is not yet approved or  cleared by the [place]  FDA and has been authorized for detection and/or diagnosis of SARS-CoV-2 by FDA under an Emergency Use Authorization (EUA). This EUA will remain in effect (meaning this test can be used) for the duration of the COVID-19 declaration under Section 564(b)(1) of the Act, 21 U.S.C. section 360bbb-3(b)(1), unless the authorization is terminated or revoked.     Resp Syncytial Virus by PCR NEGATIVE NEGATIVE Final    Comment: (NOTE) Fact Sheet for Patients: BloggerCourse.com  Fact Sheet for Healthcare Providers: SeriousBroker.it  This test is not yet approved or cleared by the [place]  FDA and has been authorized for detection and/or diagnosis of SARS-CoV-2 by FDA under an Emergency Use Authorization (EUA). This EUA will remain in effect (meaning this test can be used) for the duration of the COVID-19 declaration under Section 564(b)(1) of the Act, 21 U.S.C. section 360bbb-3(b)(1), unless the authorization is terminated or revoked.  Performed at [facility], [address], [place], [place] [address]     Procedures/Studies: CT ABDOMEN PELVIS W CONTRAST Result Date: 11/17/2023 CLINICAL DATA:  Acute abdominal pain EXAM: CT ABDOMEN AND PELVIS WITH CONTRAST TECHNIQUE: Multidetector CT imaging of the abdomen and pelvis was performed using the standard protocol following bolus administration of intravenous contrast. RADIATION DOSE REDUCTION: This exam was performed according to the departmental dose-optimization program which includes automated exposure control, adjustment of the mA and/or kV according to patient size and/or use of iterative  reconstruction technique. CONTRAST:  OMNIPAQUE  IOHEXOL  350 MG/ML SOLN COMPARISON:  Abdominal film from earlier in the same day. FINDINGS: Lower chest: Lung bases are free of acute infiltrate or sizable effusion. Hepatobiliary: Gallbladder has been surgically removed. Liver appears within normal limits. Pancreas: Unremarkable. No pancreatic ductal dilatation or surrounding inflammatory changes. Spleen: Normal in size without focal abnormality. Adrenals/Urinary Tract: Adrenal glands are within normal limits. Kidneys demonstrate a normal enhancement pattern bilaterally. No renal calculi or obstructive changes are noted. Delayed images demonstrate no acute abnormality. The ureters are within normal limits. The bladder is decompressed. Stomach/Bowel: No obstructive or inflammatory changes of the colon are seen. The appendix is not visualized consistent with prior surgical history. The small bowel and stomach are unremarkable. Vascular/Lymphatic: Aortic atherosclerosis. No enlarged abdominal or pelvic lymph nodes. Reproductive: Prostate is unremarkable. Other: No abdominal wall hernia or abnormality. No abdominopelvic ascites. Musculoskeletal: Fracture of the L2 transverse process on the right is noted of uncertain chronicity. Degenerative changes of the lumbar spine are noted. IMPRESSION: Fracture of the L2 transverse process on the right of uncertain chronicity. No other focal abnormality is noted. Electronically Signed   By: Oneil Devonshire M.D.   On: 11/17/2023 01:09   CT Angio Chest PE W and/or Wo Contrast Result Date: 11/17/2023 CLINICAL DATA:  Nausea and vomiting. EXAM: CT ANGIOGRAPHY CHEST WITH CONTRAST TECHNIQUE: Multidetector CT imaging of the chest was performed using the standard protocol during bolus administration of intravenous contrast. Multiplanar CT image reconstructions and MIPs were obtained to evaluate the vascular anatomy. RADIATION DOSE REDUCTION: This exam was performed according to the  departmental dose-optimization program which includes automated exposure control, adjustment of the mA and/or kV according to patient size and/or use of iterative reconstruction technique. CONTRAST:  OMNIPAQUE  IOHEXOL  350 MG/ML SOLN COMPARISON:  None Available. FINDINGS: Cardiovascular: A dual lead AICD is in place. There is marked severity calcification of the thoracic aorta. An artificial aortic valve is seen. Satisfactory opacification of the pulmonary arteries to the segmental level. No evidence of  pulmonary embolism. Normal heart size with marked severity coronary artery calcification. No pericardial effusion. Mediastinum/Nodes: No enlarged mediastinal, hilar, or axillary lymph nodes. Thyroid gland, trachea, and esophagus demonstrate no significant findings. Lungs/Pleura: There is a 4 mm posterolateral right upper lobe calcified lung nodule (axial CT image 57, CT series 4). 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 Mild lingular and mild bibasilar atelectasis is seen. No pleural effusion or pneumothorax is identified. Upper Abdomen: Multiple surgical clips are seen within the gallbladder fossa. A mild amount of central pneumobilia is seen. This is decreased in severity when compared to the prior study. Musculoskeletal: Multiple sternal wires are present. Postoperative changes are seen within the visualized portion of the lower cervical spine. An acute, mildly displaced posterior eleventh right rib fracture is seen (axial CT images 136 through 139, CT series 4). Review of the MIP images confirms the above findings. IMPRESSION: 1. No evidence of pulmonary embolism. 2. Acute, mildly displaced posterior eleventh right rib fracture. 3. Mild lingular and mild bibasilar atelectasis. 4. Evidence of prior cholecystectomy. 5. Aortic atherosclerosis. Electronically Signed   By: Suzen Dials M.D.   On: 11/17/2023 01:07   DG Chest Portable 1 View Result Date:  11/17/2023 CLINICAL DATA:  Nausea and vomiting and abdominal pain, initial encounter EXAM: PORTABLE CHEST 1 VIEW COMPARISON:  12/15/2022 FINDINGS: Stable cardiomegaly is noted. Defibrillator is again seen and stable. Postsurgical changes in the cervical spine are noted. No focal infiltrate or effusion is seen. No acute bony abnormality is noted. IMPRESSION: No acute abnormality noted. Electronically Signed   By: Oneil Devonshire M.D.   On: 11/17/2023 00:30   DG Abd Portable 1 View Result Date: 11/17/2023 CLINICAL DATA:  Nausea and vomiting and abdominal pain for 1 day, initial encounter EXAM: PORTABLE ABDOMEN - 1 VIEW COMPARISON:  12/13/2022 FINDINGS: Scattered large and small bowel gas is noted. No obstructive changes are seen. No free air is noted. No abnormal mass or abnormal calcifications are noted. Degenerative change of the lumbar spine. IMPRESSION: No acute abnormality noted. Electronically Signed   By: Oneil Devonshire M.D.   On: 11/17/2023 00:29   CUP PACEART REMOTE DEVICE CHECK Result Date: 11/03/2023 ICD Scheduled remote reviewed. Normal device function.  Presenting rhythm: AF/LVP/RVP. Estimated battery longevity of < 3 months; routed to [employer] to initiate monthly battery checks. Longstanding persistent AF, V rates are controlled, declines OAC per Epic. Follow up as scheduled monthly. [facility], [facility]    Time coordinating discharge: Over 30 minutes    Alm Apo, MD  [facility] 11/17/2023, 12:06 PM

## 2023-11-17 NOTE — Hospital Course (Addendum)
 [name] is a 83 y.o. 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   Patient states he had a [place] salad at [employer] for dinner on 8/12.  The following day 8/13 he started having generalized abdominal pain and was passing a lot of gas.  He had a few episodes of vomiting at home which started after he tried taking Pepto-Bismol.  Reports temperature of 101 F at home and took Tylenol  prior to coming into the ED.  Patient states she just before EMS arrived, as he was trying to get up from his couch, his legs felt weak and he fell on his right side hitting his posterior ribs against the corner of a table and since then this area feels sore.  Denies head injury or loss of consciousness.  He reports history of chronic lumbar vertebral fracture for which he is seen by orthopedics and was told that no surgery was needed.  He is not having any pain in his back.    He was admitted for further workup.  CT abdomen/pelvis was negative for acute abnormalities.  Underlying old L2 transverse process fracture noted. CT angio chest negative for PE and showed acute mildly displaced posterior 11th right rib fracture.  Symptoms of nausea improved and he tolerated a diet well.   Assessment and Plan   Elevated troponin Wide-complex tachycardia - given recurrent episodes of tachycardia and shaking in context of diaporesis, N/V, and abd pain on admission, so concern for cardiac etiology - initial trop on admission was flat (20>>21); repeated 8/15 and elevated further 128 (no CP complaints); EKG is V-paced  - asked for cardiology input given symptoms  - evaluated by EP; pacer also interrogated and notes <4 mths battery life and  no alarming events recently - trop peaked at 128, then downtrended; echo shows preserved EF, 50 to 55%, mild LVH, indeterminate diastolic parameters - Cardiology following; Home meds resumed - Outpatient follow-up with EP  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 Baseline creat ~ 1.3, eGFR~ 50 - 55 - initial creat 1.2 on admission, but 1.61 the morning after and still uptrending - differential includes prolonged pre-renal +/- ATN due to this vs hypoperfusion (prolonged hypotension vs cardiogenic shock) - echo negative for reduced function - FeNa technically 0.1%, UA did not show any casts - If accurate, 24-hour urine output 470 cc - CT A/P from 8/14 shows no obstructive changes and ureters within normal limits, decompressed bladder - bladder scan negative for retention on 8/15 - creat still worse 8/16; will ask for nephrology input; agreed with continuing on fluids -Output has also continued to increase, likely consistent with ATN diuretic phase -Renal function also showing improvement on 8/17, 2.43 improved from 3.53 yesterday -Renal function continued to improve prior to discharge with increasing urine output; clinical picture consistent with ATN -Creatinine 1.65 at discharge -Lisinopril , spironolactone , and Lasix  held at discharge for several more days prior to resuming - repeat BMP at follow up  Generalized abdominal pain, nausea, vomiting-resolved SIRS-resolved - Suspecting possible viral illness precipitating nausea/vomiting.  He had no complaints of diarrhea -If does develop diarrhea, will send for testing -UA negative for signs of infection - CBC improved without abx -WBC normalized and stayed within normal range for multiple days without antibiotics  Acute hypoxia-resolved - CT angio chest negative for PE; no obvious infiltrates or effusion - Known new right 11th rib fracture and does have some mild bibasilar atelectasis already seen on CT angio chest -Possible  progression of atelectasis in setting of rib fracture -Able to be weaned off of oxygen prior to discharge.  Also performed walk test with lowest saturation 89% with quick recovery  History of nonischemic cardiomyopathy Last echo done in September 2023 showing improvement of EF to 50 to 55%. - Initially resumed Coreg  after admission but blood pressure dropped and discontinued once again -Coreg  again resumed on 11/19/2023 and BP stable -Also holding Lasix , lisinopril , spironolactone  for now; resuming as able -Repeat echo reassuring with preserved EF  Paroxysmal A-fib/flutter Not on anticoagulation.   - Coreg  resumed 11/19/2023 per cardiology, blood pressure holding stable   Mild thrombocytopenia No signs of bleeding.  Monitor CBC.   Acute mildly displaced posterior 11th right rib fracture - Secondary to mechanical fall at home prior to ED arrival.  Patient is reporting mild pain/soreness in this area.  - Continue pain management   L2 transverse process fracture on the right of uncertain chronicity Seen on CT.  Per conversation with the patient, it seems this might be a chronic finding and he is seen by orthopedics on an outpatient basis.  He denies any back pain at this time.   Type 2 diabetes - Last A1c 7.4 in September 2024 - Continue home regimen   Gout - hold allopurinol  for now in setting of worsening renal function   Hypertension - Meds being resumed slowly at discharge especially in setting of ATN

## 2023-11-17 NOTE — ED Notes (Signed)
 Pt placed on O2 via [place] is now at 93% on 6 lpm

## 2023-11-17 NOTE — ED Notes (Signed)
 Pt able to take PO without difficulty

## 2023-11-18 ENCOUNTER — Observation Stay (HOSPITAL_COMMUNITY)

## 2023-11-18 ENCOUNTER — Encounter (HOSPITAL_COMMUNITY): Payer: Self-pay | Admitting: Internal Medicine

## 2023-11-18 DIAGNOSIS — E871 Hypo-osmolality and hyponatremia: Secondary | ICD-10-CM | POA: Diagnosis present

## 2023-11-18 DIAGNOSIS — I4729 Other ventricular tachycardia: Secondary | ICD-10-CM | POA: Diagnosis not present

## 2023-11-18 DIAGNOSIS — R7989 Other specified abnormal findings of blood chemistry: Secondary | ICD-10-CM | POA: Diagnosis not present

## 2023-11-18 DIAGNOSIS — W07XXXA Fall from chair, initial encounter: Secondary | ICD-10-CM | POA: Diagnosis present

## 2023-11-18 DIAGNOSIS — E1122 Type 2 diabetes mellitus with diabetic chronic kidney disease: Secondary | ICD-10-CM | POA: Diagnosis present

## 2023-11-18 DIAGNOSIS — R112 Nausea with vomiting, unspecified: Secondary | ICD-10-CM | POA: Diagnosis present

## 2023-11-18 DIAGNOSIS — Z7982 Long term (current) use of aspirin: Secondary | ICD-10-CM | POA: Diagnosis not present

## 2023-11-18 DIAGNOSIS — Z825 Family history of asthma and other chronic lower respiratory diseases: Secondary | ICD-10-CM | POA: Diagnosis not present

## 2023-11-18 DIAGNOSIS — Z953 Presence of xenogenic heart valve: Secondary | ICD-10-CM | POA: Diagnosis not present

## 2023-11-18 DIAGNOSIS — I429 Cardiomyopathy, unspecified: Secondary | ICD-10-CM

## 2023-11-18 DIAGNOSIS — R000 Tachycardia, unspecified: Secondary | ICD-10-CM | POA: Diagnosis not present

## 2023-11-18 DIAGNOSIS — R0902 Hypoxemia: Secondary | ICD-10-CM

## 2023-11-18 DIAGNOSIS — I428 Other cardiomyopathies: Secondary | ICD-10-CM | POA: Diagnosis present

## 2023-11-18 DIAGNOSIS — R6510 Systemic inflammatory response syndrome (SIRS) of non-infectious origin without acute organ dysfunction: Secondary | ICD-10-CM | POA: Diagnosis present

## 2023-11-18 DIAGNOSIS — N1831 Chronic kidney disease, stage 3a: Secondary | ICD-10-CM

## 2023-11-18 DIAGNOSIS — N170 Acute kidney failure with tubular necrosis: Secondary | ICD-10-CM | POA: Diagnosis present

## 2023-11-18 DIAGNOSIS — Z79899 Other long term (current) drug therapy: Secondary | ICD-10-CM | POA: Diagnosis not present

## 2023-11-18 DIAGNOSIS — N179 Acute kidney failure, unspecified: Secondary | ICD-10-CM | POA: Diagnosis not present

## 2023-11-18 DIAGNOSIS — Z823 Family history of stroke: Secondary | ICD-10-CM | POA: Diagnosis not present

## 2023-11-18 DIAGNOSIS — I10 Essential (primary) hypertension: Secondary | ICD-10-CM | POA: Diagnosis not present

## 2023-11-18 DIAGNOSIS — I4891 Unspecified atrial fibrillation: Secondary | ICD-10-CM | POA: Diagnosis present

## 2023-11-18 DIAGNOSIS — I4819 Other persistent atrial fibrillation: Secondary | ICD-10-CM | POA: Diagnosis not present

## 2023-11-18 DIAGNOSIS — I129 Hypertensive chronic kidney disease with stage 1 through stage 4 chronic kidney disease, or unspecified chronic kidney disease: Secondary | ICD-10-CM | POA: Diagnosis present

## 2023-11-18 DIAGNOSIS — I509 Heart failure, unspecified: Secondary | ICD-10-CM | POA: Diagnosis not present

## 2023-11-18 DIAGNOSIS — Z820 Family history of epilepsy and other diseases of the nervous system: Secondary | ICD-10-CM | POA: Diagnosis not present

## 2023-11-18 DIAGNOSIS — I4821 Permanent atrial fibrillation: Secondary | ICD-10-CM | POA: Diagnosis present

## 2023-11-18 DIAGNOSIS — Z8249 Family history of ischemic heart disease and other diseases of the circulatory system: Secondary | ICD-10-CM | POA: Diagnosis not present

## 2023-11-18 DIAGNOSIS — Z833 Family history of diabetes mellitus: Secondary | ICD-10-CM | POA: Diagnosis not present

## 2023-11-18 DIAGNOSIS — R1084 Generalized abdominal pain: Secondary | ICD-10-CM | POA: Diagnosis not present

## 2023-11-18 DIAGNOSIS — I4892 Unspecified atrial flutter: Secondary | ICD-10-CM | POA: Diagnosis present

## 2023-11-18 DIAGNOSIS — I480 Paroxysmal atrial fibrillation: Secondary | ICD-10-CM | POA: Diagnosis not present

## 2023-11-18 DIAGNOSIS — D696 Thrombocytopenia, unspecified: Secondary | ICD-10-CM | POA: Diagnosis present

## 2023-11-18 DIAGNOSIS — Z7985 Long-term (current) use of injectable non-insulin antidiabetic drugs: Secondary | ICD-10-CM | POA: Diagnosis not present

## 2023-11-18 DIAGNOSIS — S2231XA Fracture of one rib, right side, initial encounter for closed fracture: Secondary | ICD-10-CM | POA: Diagnosis present

## 2023-11-18 DIAGNOSIS — Z87891 Personal history of nicotine dependence: Secondary | ICD-10-CM | POA: Diagnosis not present

## 2023-11-18 DIAGNOSIS — Z1152 Encounter for screening for COVID-19: Secondary | ICD-10-CM | POA: Diagnosis not present

## 2023-11-18 DIAGNOSIS — Y92008 Other place in unspecified non-institutional (private) residence as the place of occurrence of the external cause: Secondary | ICD-10-CM | POA: Diagnosis not present

## 2023-11-18 DIAGNOSIS — M109 Gout, unspecified: Secondary | ICD-10-CM | POA: Diagnosis present

## 2023-11-18 DIAGNOSIS — Z9049 Acquired absence of other specified parts of digestive tract: Secondary | ICD-10-CM | POA: Diagnosis not present

## 2023-11-18 LAB — COMPREHENSIVE METABOLIC PANEL WITH GFR
ALT: 51 U/L — ABNORMAL HIGH (ref 0–44)
AST: 44 U/L — ABNORMAL HIGH (ref 15–41)
Albumin: 3.1 g/dL — ABNORMAL LOW (ref 3.5–5.0)
Alkaline Phosphatase: 99 U/L (ref 38–126)
Anion gap: 14 (ref 5–15)
BUN: 56 mg/dL — ABNORMAL HIGH (ref 8–23)
CO2: 20 mmol/L — ABNORMAL LOW (ref 22–32)
Calcium: 8.5 mg/dL — ABNORMAL LOW (ref 8.9–10.3)
Chloride: 98 mmol/L (ref 98–111)
Creatinine, Ser: 2.45 mg/dL — ABNORMAL HIGH (ref 0.61–1.24)
GFR, Estimated: 25 mL/min — ABNORMAL LOW (ref >60)
Glucose, Bld: 189 mg/dL — ABNORMAL HIGH (ref 70–99)
Potassium: 3.8 mmol/L (ref 3.5–5.1)
Sodium: 132 mmol/L — ABNORMAL LOW (ref 135–145)
Total Bilirubin: 2.2 mg/dL — ABNORMAL HIGH (ref 0.0–1.2)
Total Protein: 6.1 g/dL — ABNORMAL LOW (ref 6.5–8.1)

## 2023-11-18 LAB — CBC WITH DIFFERENTIAL/PLATELET
Abs Immature Granulocytes: 0.37 K/uL — ABNORMAL HIGH (ref 0.00–0.07)
Basophils Absolute: 0 K/uL (ref 0.0–0.1)
Basophils Relative: 0 %
Eosinophils Absolute: 0 K/uL (ref 0.0–0.5)
Eosinophils Relative: 0 %
HCT: 38 % — ABNORMAL LOW (ref 39.0–52.0)
Hemoglobin: 12.2 g/dL — ABNORMAL LOW (ref 13.0–17.0)
Immature Granulocytes: 3 %
Lymphocytes Relative: 2 %
Lymphs Abs: 0.3 K/uL — ABNORMAL LOW (ref 0.7–4.0)
MCH: 29 pg (ref 26.0–34.0)
MCHC: 32.1 g/dL (ref 30.0–36.0)
MCV: 90.3 fL (ref 80.0–100.0)
Monocytes Absolute: 0.9 K/uL (ref 0.1–1.0)
Monocytes Relative: 7 %
Neutro Abs: 10.9 K/uL — ABNORMAL HIGH (ref 1.7–7.7)
Neutrophils Relative %: 88 %
Platelets: 108 K/uL — ABNORMAL LOW (ref 150–400)
RBC: 4.21 MIL/uL — ABNORMAL LOW (ref 4.22–5.81)
RDW: 14.8 % (ref 11.5–15.5)
Smear Review: NORMAL
WBC: 12.5 K/uL — ABNORMAL HIGH (ref 4.0–10.5)
nRBC: 0 % (ref 0.0–0.2)

## 2023-11-18 LAB — ECHOCARDIOGRAM COMPLETE
AR max vel: 0.89 cm2
AV Area VTI: 0.88 cm2
AV Area mean vel: 0.83 cm2
AV Mean grad: 12 mmHg
AV Peak grad: 22.1 mmHg
Ao pk vel: 2.35 m/s
Area-P 1/2: 3.77 cm2
Height: 70 in
MV M vel: 3.73 m/s
MV Peak grad: 55.5 mmHg
S' Lateral: 4.6 cm
Weight: 2880 [oz_av]

## 2023-11-18 LAB — CBC
HCT: 40 % (ref 39.0–52.0)
Hemoglobin: 12.9 g/dL — ABNORMAL LOW (ref 13.0–17.0)
MCH: 28.8 pg (ref 26.0–34.0)
MCHC: 32.3 g/dL (ref 30.0–36.0)
MCV: 89.3 fL (ref 80.0–100.0)
Platelets: 117 K/uL — ABNORMAL LOW (ref 150–400)
RBC: 4.48 MIL/uL (ref 4.22–5.81)
RDW: 14.6 % (ref 11.5–15.5)
WBC: 8.3 K/uL (ref 4.0–10.5)
nRBC: 0 % (ref 0.0–0.2)

## 2023-11-18 LAB — BASIC METABOLIC PANEL WITH GFR
Anion gap: 14 (ref 5–15)
BUN: 58 mg/dL — ABNORMAL HIGH (ref 8–23)
CO2: 19 mmol/L — ABNORMAL LOW (ref 22–32)
Calcium: 8.3 mg/dL — ABNORMAL LOW (ref 8.9–10.3)
Chloride: 99 mmol/L (ref 98–111)
Creatinine, Ser: 2.83 mg/dL — ABNORMAL HIGH (ref 0.61–1.24)
GFR, Estimated: 21 mL/min — ABNORMAL LOW (ref >60)
Glucose, Bld: 178 mg/dL — ABNORMAL HIGH (ref 70–99)
Potassium: 3.7 mmol/L (ref 3.5–5.1)
Sodium: 132 mmol/L — ABNORMAL LOW (ref 135–145)

## 2023-11-18 LAB — BLOOD GAS, VENOUS
Acid-base deficit: 3.6 mmol/L — ABNORMAL HIGH (ref 0.0–2.0)
Bicarbonate: 21.5 mmol/L (ref 20.0–28.0)
Drawn by: 69686
O2 Saturation: 90.2 %
Patient temperature: 37.1
pCO2, Ven: 38 mmHg — ABNORMAL LOW (ref 44–60)
pH, Ven: 7.36 (ref 7.25–7.43)
pO2, Ven: 60 mmHg — ABNORMAL HIGH (ref 32–45)

## 2023-11-18 LAB — HEMOGLOBIN A1C
Hgb A1c MFr Bld: 7.5 % — ABNORMAL HIGH (ref 4.8–5.6)
Mean Plasma Glucose: 169 mg/dL

## 2023-11-18 LAB — GLUCOSE, CAPILLARY
Glucose-Capillary: 159 mg/dL — ABNORMAL HIGH (ref 70–99)
Glucose-Capillary: 205 mg/dL — ABNORMAL HIGH (ref 70–99)
Glucose-Capillary: 191 mg/dL — ABNORMAL HIGH (ref 70–99)
Glucose-Capillary: 216 mg/dL — ABNORMAL HIGH (ref 70–99)

## 2023-11-18 LAB — PROCALCITONIN: Procalcitonin: 25.35 ng/mL

## 2023-11-18 LAB — SODIUM, URINE, RANDOM: Sodium, Ur: 11 mmol/L

## 2023-11-18 LAB — MAGNESIUM: Magnesium: 2.1 mg/dL (ref 1.7–2.4)

## 2023-11-18 LAB — TROPONIN I (HIGH SENSITIVITY)
Troponin I (High Sensitivity): 128 ng/L (ref <18)
Troponin I (High Sensitivity): 114 ng/L (ref <18)

## 2023-11-18 LAB — CREATININE, URINE, RANDOM: Creatinine, Urine: 230 mg/dL

## 2023-11-18 LAB — LACTIC ACID, PLASMA: Lactic Acid, Venous: 1.7 mmol/L (ref 0.5–1.9)

## 2023-11-18 MED ORDER — ONDANSETRON HCL 4 MG/2ML IJ SOLN: 4.0000 mg | Freq: Four times a day (QID) | INTRAMUSCULAR | Status: DC | PRN

## 2023-11-18 MED ORDER — PERFLUTREN LIPID MICROSPHERE
1.0000 mL | INTRAVENOUS | Status: AC | PRN
  Administered 2023-11-18: 4 mL via INTRAVENOUS

## 2023-11-18 MED ORDER — LACTATED RINGERS IV SOLN: INTRAVENOUS | Status: AC

## 2023-11-18 MED ORDER — AMIODARONE HCL IN DEXTROSE 360-4.14 MG/200ML-% IV SOLN
60.0000 mg/h | INTRAVENOUS | Status: DC
  Administered 2023-11-18: 60 mg/h via INTRAVENOUS
  Filled 2023-11-18: qty 200

## 2023-11-18 MED ORDER — AMIODARONE HCL IN DEXTROSE 360-4.14 MG/200ML-% IV SOLN: 30.0000 mg/h | INTRAVENOUS | Status: DC

## 2023-11-18 MED ORDER — ENOXAPARIN SODIUM 30 MG/0.3ML IJ SOSY
30.0000 mg | PREFILLED_SYRINGE | Freq: Every day | INTRAMUSCULAR | Status: DC
  Administered 2023-11-18 – 2023-11-21 (×4): 30 mg via SUBCUTANEOUS
  Filled 2023-11-18 (×4): qty 0.3

## 2023-11-18 MED ORDER — AMIODARONE LOAD VIA INFUSION
150.0000 mg | Freq: Once | INTRAVENOUS | Status: AC
  Administered 2023-11-18: 150 mg via INTRAVENOUS
  Filled 2023-11-18: qty 83.34

## 2023-11-18 NOTE — Progress Notes (Signed)
 Progress Note    [name]   FMW:[id number]  DOB: 1940-11-14  DOA: 11/16/2023     0 PCP: Vernadine Charlie ORN, MD  Initial CC: abd pain, vomiting  Hospital Course: [name] is a 83 y.o. 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   Patient states he had a [place] salad at [employer] for dinner on 8/12.  The following day 8/13 he started having generalized abdominal pain and was passing a lot of gas.  He had a few episodes of vomiting at home which started after he tried taking Pepto-Bismol.  Reports temperature of 101 F at home and took Tylenol  prior to coming into the ED.  Patient states she just before EMS arrived, as he was trying to get up from his couch, his legs felt weak and he fell on his right side hitting his posterior ribs against the corner of a table and since then this area feels sore.  Denies head injury or loss of consciousness.  He reports history of chronic lumbar vertebral fracture for which he is seen by orthopedics and was told that no surgery was needed.  He is not having any pain in his back.    He was admitted for further workup.  CT abdomen/pelvis was negative for acute abnormalities.  Underlying old L2 transverse process fracture noted. CT angio chest negative for PE and showed acute mildly displaced posterior 11th right rib fracture.  Symptoms of nausea improved and he tolerated a diet well.   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 Baseline creat ~ 1.3, eGFR~ 50 - 55 - initial creat 1.2 on admission, but 1.61 the morning after and still uptrending - differential includes prolonged pre-renal +/- ATN due to this vs hypoperfusion (prolonged hypotension vs cardiogenic shock) - follow up echo - continue on LR for now and re-assess creat in am - CT A/P from 8/14 shows no obstructive changes and ureters within normal limits, decompressed bladder - Check bladder scan - strict I&O for now - urine studies, check FeNa  Generalized abdominal pain, nausea, vomiting SIRS - Suspecting possible viral illness precipitating nausea/vomiting.  He had no complaints of diarrhea -If does develop diarrhea, will send for testing -UA negative for signs of infection -Bands on CBC slowly uptrending, repeat CBC tomorrow - continue monitoring off abx  Acute hypoxia - CT angio chest negative for PE; no obvious infiltrates or effusion - Known new right 11th rib fracture and does have some mild bibasilar atelectasis already seen on CT angio chest -Possible progression of atelectasis in setting of rib fracture - continue O2 for now and wean as able - serial CXR still negative for edema, effusions, infiltrate; mostly hypoinflation and atelectasis  - wean O2 as able  History of nonischemic cardiomyopathy Last echo done in September 2023 showing improvement of EF to 50 to 55%. - Initially resumed Coreg  after admission but blood pressure dropped and discontinued once again -Also holding Lasix , lisinopril , spironolactone  for now; resuming as able - follow up repeat echo  Paroxysmal A-fib/flutter Not on anticoagulation.   - coreg  resumed   Mild thrombocytopenia No signs of  bleeding.  Monitor CBC.   Acute mildly displaced posterior 11th right rib fracture - Secondary to mechanical fall at home prior to ED arrival.  Patient is reporting mild pain/soreness in this area.  - Continue pain management   L2 transverse process fracture on the right of uncertain chronicity Seen on CT.  Per conversation with the patient, it seems this might be a chronic finding and he is seen by orthopedics on an outpatient basis.  He denies any back pain at this time.   Type 2 diabetes - Last A1c 7.4 in September 2024, repeat ordered.  Placed on sensitive sliding scale insulin  [facility].   Gout Continue allopurinol .   Hypertension - most meds on hold for now; was hypotensive initially   Interval History:  Initially doing well when seen this morning and abdominal pain had resolved along with nausea.  He also ate breakfast well and ambulated with OT. Tentative plan was for discharge but this afternoon he began to feel worse again developing shaking and tachycardia.  Blood pressure also rebounded.  He then became hypoxic requiring 5 L oxygen. Discharge canceled to allow for further monitoring and workup if necessary.  Old records reviewed in assessment of this patient  Antimicrobials:   DVT prophylaxis:  enoxaparin  (LOVENOX ) injection 30 mg Start: 11/18/23 1000   Code Status:   Code Status: Full Code  Mobility Assessment (Last 72 Hours)     Mobility Assessment     Row Name 11/18/23 0815 11/17/23 2000 11/17/23 1513 11/17/23 1214     Does the patient have exclusion criteria? No - Perform mobility assessment No - Perform mobility assessment No - Perform mobility assessment --    What is the highest level of mobility based on the mobility assessment? Level 4 (Ambulates with assistance) - Balance while stepping forward/back - Complete Level 4 (Ambulates with assistance) - Balance while stepping forward/back - Complete Level 4 (Ambulates with assistance) - Balance while stepping  forward/back - Complete Level 5 (Ambulates independently) - Balance while walking independently - Complete       Barriers to discharge: None Disposition Plan: Home HH orders placed: N/A Status is: Observation  Objective: Blood pressure (!) 99/55, pulse 77, temperature 98.3 F (36.8 C), temperature source Oral, resp. rate 20, height 5' 10 (1.778 m), weight 81.6 kg, SpO2 97%.  Examination:  Physical Exam Constitutional:      General: He is not in acute distress.    Appearance: Normal appearance.  HENT:     Head: Normocephalic and atraumatic.     Mouth/Throat:     Mouth: Mucous membranes are moist.  Eyes:     Extraocular Movements: Extraocular movements intact.  Cardiovascular:     Rate and Rhythm: Normal rate. Rhythm irregular.  Pulmonary:     Effort: Pulmonary effort is normal. No respiratory distress.     Breath sounds: Normal breath sounds. No wheezing.     Comments: Mild abrasion along lower right back Abdominal:     General: Bowel sounds are normal. There is no distension.     Palpations: Abdomen is soft.     Tenderness: There  is no abdominal tenderness.  Musculoskeletal:        General: Normal range of motion.     Cervical back: Normal range of motion and neck supple.     Right lower leg: Edema (1+) present.     Left lower leg: Edema (1+) present.  Skin:    General: Skin is warm and dry.  Neurological:     General: No focal deficit present.     Mental Status: He is alert.  Psychiatric:        Mood and Affect: Mood normal.        Behavior: Behavior normal.      Consultants:    Procedures:    Data Reviewed: Results for orders placed or performed during the hospital encounter of 11/16/23 (from the past 24 hours)  Glucose, capillary     Status: Abnormal   Collection Time: 11/17/23  4:21 PM  Result Value Ref Range   Glucose-Capillary 188 (H) 70 - 99 mg/dL  Glucose, capillary     Status: Abnormal   Collection Time: 11/17/23  8:50 PM  Result Value Ref  Range   Glucose-Capillary 213 (H) 70 - 99 mg/dL  Glucose, capillary     Status: Abnormal   Collection Time: 11/17/23 11:47 PM  Result Value Ref Range   Glucose-Capillary 178 (H) 70 - 99 mg/dL  CBC     Status: Abnormal   Collection Time: 11/18/23 12:48 AM  Result Value Ref Range   WBC 8.3 4.0 - 10.5 K/uL   RBC 4.48 4.22 - 5.81 MIL/uL   Hemoglobin 12.9 (L) 13.0 - 17.0 g/dL   HCT 59.9 60.9 - 47.9 %   MCV 89.3 80.0 - 100.0 fL   MCH 28.8 26.0 - 34.0 pg   MCHC 32.3 30.0 - 36.0 g/dL   RDW 85.3 88.4 - 84.4 %   Platelets 117 (L) 150 - 400 K/uL   nRBC 0.0 0.0 - 0.2 %  Comprehensive metabolic panel     Status: Abnormal   Collection Time: 11/18/23 12:48 AM  Result Value Ref Range   Sodium 132 (L) 135 - 145 mmol/L   Potassium 3.8 3.5 - 5.1 mmol/L   Chloride 98 98 - 111 mmol/L   CO2 20 (L) 22 - 32 mmol/L   Glucose, Bld 189 (H) 70 - 99 mg/dL   BUN 56 (H) 8 - 23 mg/dL   Creatinine, Ser 7.54 (H) 0.61 - 1.24 mg/dL   Calcium  8.5 (L) 8.9 - 10.3 mg/dL   Total Protein 6.1 (L) 6.5 - 8.1 g/dL   Albumin 3.1 (L) 3.5 - 5.0 g/dL   AST 44 (H) 15 - 41 U/L   ALT 51 (H) 0 - 44 U/L   Alkaline Phosphatase 99 38 - 126 U/L   Total Bilirubin 2.2 (H) 0.0 - 1.2 mg/dL   GFR, Estimated 25 (L) >60 mL/min   Anion gap 14 5 - 15  Lactic acid, plasma     Status: None   Collection Time: 11/18/23 12:48 AM  Result Value Ref Range   Lactic Acid, Venous 1.7 0.5 - 1.9 mmol/L  Blood gas, venous     Status: Abnormal   Collection Time: 11/18/23 12:48 AM  Result Value Ref Range   pH, Ven 7.36 7.25 - 7.43   pCO2, Ven 38 (L) 44 - 60 mmHg   pO2, Ven 60 (H) 32 - 45 mmHg   Bicarbonate 21.5 20.0 - 28.0 mmol/L   Acid-base deficit 3.6 (H) 0.0 - 2.0 mmol/L  O2 Saturation 90.2 %   Patient temperature 37.1    Collection site BLOOD LEFT ARM    Drawn by [id number]   Procalcitonin     Status: None   Collection Time: 11/18/23  4:37 AM  Result Value Ref Range   Procalcitonin 25.35 ng/mL  Troponin I (High Sensitivity)     Status:  Abnormal   Collection Time: 11/18/23  4:37 AM  Result Value Ref Range   Troponin I (High Sensitivity) 128 (HH) <18 ng/L  Basic metabolic panel with GFR     Status: Abnormal   Collection Time: 11/18/23  4:40 AM  Result Value Ref Range   Sodium 132 (L) 135 - 145 mmol/L   Potassium 3.7 3.5 - 5.1 mmol/L   Chloride 99 98 - 111 mmol/L   CO2 19 (L) 22 - 32 mmol/L   Glucose, Bld 178 (H) 70 - 99 mg/dL   BUN 58 (H) 8 - 23 mg/dL   Creatinine, Ser 7.16 (H) 0.61 - 1.24 mg/dL   Calcium  8.3 (L) 8.9 - 10.3 mg/dL   GFR, Estimated 21 (L) >60 mL/min   Anion gap 14 5 - 15  CBC with Differential/Platelet     Status: Abnormal   Collection Time: 11/18/23  4:40 AM  Result Value Ref Range   WBC 12.5 (H) 4.0 - 10.5 K/uL   RBC 4.21 (L) 4.22 - 5.81 MIL/uL   Hemoglobin 12.2 (L) 13.0 - 17.0 g/dL   HCT 61.9 (L) 60.9 - 47.9 %   MCV 90.3 80.0 - 100.0 fL   MCH 29.0 26.0 - 34.0 pg   MCHC 32.1 30.0 - 36.0 g/dL   RDW 85.1 88.4 - 84.4 %   Platelets 108 (L) 150 - 400 K/uL   nRBC 0.0 0.0 - 0.2 %   Neutrophils Relative % 88 %   Neutro Abs 10.9 (H) 1.7 - 7.7 K/uL   Lymphocytes Relative 2 %   Lymphs Abs 0.3 (L) 0.7 - 4.0 K/uL   Monocytes Relative 7 %   Monocytes Absolute 0.9 0.1 - 1.0 K/uL   Eosinophils Relative 0 %   Eosinophils Absolute 0.0 0.0 - 0.5 K/uL   Basophils Relative 0 %   Basophils Absolute 0.0 0.0 - 0.1 K/uL   WBC Morphology See Note    Smear Review Normal platelet morphology    Immature Granulocytes 3 %   Abs Immature Granulocytes 0.37 (H) 0.00 - 0.07 K/uL   Dohle Bodies PRESENT    Burr Cells PRESENT    Ovalocytes PRESENT   Magnesium      Status: None   Collection Time: 11/18/23  4:40 AM  Result Value Ref Range   Magnesium  2.1 1.7 - 2.4 mg/dL  Glucose, capillary     Status: Abnormal   Collection Time: 11/18/23  7:29 AM  Result Value Ref Range   Glucose-Capillary 159 (H) 70 - 99 mg/dL  Troponin I (High Sensitivity)     Status: Abnormal   Collection Time: 11/18/23  1:23 PM  Result Value Ref  Range   Troponin I (High Sensitivity) 114 (HH) <18 ng/L  Glucose, capillary     Status: Abnormal   Collection Time: 11/18/23  1:40 PM  Result Value Ref Range   Glucose-Capillary 205 (H) 70 - 99 mg/dL    I have reviewed pertinent nursing notes, vitals, labs, and images as necessary. I have ordered labwork to follow up on as indicated.  I have reviewed the last notes from staff over past 24 hours. I have discussed  patient's care plan and test results with nursing staff, CM/SW, and other staff as appropriate.  Time spent: Greater than 50% of the 55 minute visit was spent in counseling/coordination of care for the patient as laid out in the A&P.   LOS: 0 days   Alm Apo, MD [facility] 11/18/2023, 3:53 PM

## 2023-11-18 NOTE — Consult Note (Addendum)
 Cardiology Consultation   Patient ID: CHUE BERKOVICH MRN: [id number]; DOB: 09/15/1940  Admit date: 11/16/2023 Date of Consult: 11/18/2023  PCP:  Vernadine Charlie ORN, MD   [facility] Providers Cardiologist:  Redell Shallow, MD  Electrophysiologist:  Elspeth Sage, MD       Patient Profile: [name] is a 83 y.o. male with a hx of bicuspid aortic valve s/p AVR and MAZE for PAF, replacement of ascending aortic aneurysm, s/p CRT-D 2011, NICM with LVEF 20% on LHC prior to sternotomy with improved LVEF to 55% who is being seen 11/18/2023 for the evaluation of elevated troponin at the request of Dr. Patsy.  History of Present Illness:  [name] with the above PMH did develop Afib 6 months after AVR/maze and DCCV was attempted, but he reverted back to Afib within a week, per patient. He was maintained on tikosyn ; however, this was reduced for QT prolongation and then discontinued as the patient wanted to stop anticoagulation and he was in atrial flutter. Per notes, persistent Afib since 09/2020. DCCV 05/2021 for persistent Afib; however, now back in likely permanent Afib.   Gen change 2016.  Follow up echocardiograms have confirmed improved LVEF, GDMT with coreg , lisinopril , spironolactone , lasix , and SGLT2i.   Last echo 12/2021 with LVEF 50-55%, mild LVH, severe akinesis of the left ventricular, apical, and anteroseptal wall consistent with RV pacemaker, moderate LAE, and good aortic valve function.   Device interrogation with < 3 months battery, device clinic aware for monthly battery checks.   He presented to [facility] with N/V and fall at home resulting in rib fracture. He ate restaurant food on 11/15/23 and developed abdominal pain on 11/16/23 followed by fever of 101 and vomiting. He unfortunately sustained a fall when trying to get up from the couch prior to EMS arrival and hit the edge of the coffee table. In addition, a new puppy has been introduced and diagnosed with giardia,  the pt denies diarrhea. Tachycardia and hypotension resolved with IV fluids. UA negative, CT negative for acute intra-abdominal findings, and symptoms improved. CT showed mildly displaced posterior 11th right rib fracture. He tolerated PO intake. He was initially planned for discharge home with supportive care for likely food-born illness but later developed confusion, shivering/shaking, tachycardia with hypoxia. Discharge was canceled and he was kept for continued monitoring.   Labs indicated acute on chronic kidney injury with sCr 2.83 from baseline 1.3, hyponatremia of 132, leukocytosis of 12.5, Hb 12.2, and lactic acid 1.7.   Given cardiac history, hs troponin obtained and resulted at 128 --> delta pending.  Cardiology consulted for elevated troponin.   On telemetry review, appears he had about 15 min of tachycardia: found to be atrial flutter  During my interview, he reports feeling unwell around midnight, near the time of his tachycardia. He was unaware of tachycardia or palpitations. Prior to this GI illness, he was in his usual state of health doing his normal routine and wood working. No chest pain, SOB, or LE swelling. He does take daily lasix .   Past Medical History:  Diagnosis Date   Aortic stenosis/ bicuspid valve--s/p AVR    Ascending aortic aneurysm (HCC)    Atrial fibrillation/flutter    Biventricular implantable cardiac defibrillator- BSX    Cholelithiases    Diabetes mellitus without complication (HCC)    DJD (degenerative joint disease)    Hypertension    LBBB (left bundle branch block)    Non-ischemic cardiomyopathy (HCC)    OSA (obstructive sleep  apnea)     Past Surgical History:  Procedure Laterality Date   AORTIC VALVE REPLACEMENT     APPENDECTOMY     CARDIOVERSION N/A 05/18/2021   Procedure: CARDIOVERSION;  Surgeon: Raford Riggs, MD;  Location: [facility] ENDOSCOPY;  Service: Cardiovascular;  Laterality: N/A;   CERVICAL DISCECTOMY  5/01   C7   CHOLECYSTECTOMY      EP IMPLANTABLE DEVICE N/A 11/20/2014   Procedure:  ICD Generator Changeout;  Surgeon: Elspeth JAYSON Sage, MD;  Location: [facility] INVASIVE CV LAB;  Service: Cardiovascular;  Laterality: N/A;   MEDIAN STERNOTOMY     TONSILLECTOMY       Home Medications:  Prior to Admission medications   Medication Sig Start Date End Date Taking? Authorizing Provider  allopurinol  (ZYLOPRIM ) 100 MG tablet Take 1 tablet (100 mg total) by mouth daily. 08/04/23  Yes   aspirin  81 MG chewable tablet Chew 81 mg by mouth daily.   Yes [provider]  carvedilol  (COREG ) 12.5 MG tablet Take 1 tablet (12.5 mg total) by mouth 2 (two) times daily with a meal. 01/25/23  Yes Crenshaw, Redell RAMAN, MD  dapagliflozin  propanediol (FARXIGA ) 10 MG TABS tablet Take 1 tablet (10 mg total) by mouth daily. 09/08/23  Yes   furosemide  (LASIX ) 40 MG tablet Take 1 tablet (40 mg total) by mouth daily. Take an extra tablet as needed for swelling or shortness of breath. 07/14/23  Yes Pietro Redell RAMAN, MD  ibuprofen  (ADVIL ) 200 MG tablet Take 200 mg by mouth every 6 (six) hours as needed.   Yes [provider]  lisinopril  (ZESTRIL ) 40 MG tablet Take 1 tablet (40 mg total) by mouth daily. 11/26/22  Yes   Semaglutide , 2 MG/DOSE, (OZEMPIC , 2 MG/DOSE,) 8 MG/3ML SOPN Inject 2 mg into the skin once a week. 04/07/23  Yes   spironolactone  (ALDACTONE ) 25 MG tablet Take 0.5 tablets (12.5 mg total) by mouth daily. 10/17/23 10/16/24 Yes Pietro Redell RAMAN, MD  albuterol  (VENTOLIN  HFA) 108 (90 Base) MCG/ACT inhaler Inhale 2 puffs into the lungs every 6 (six) hours as needed for wheezing or shortness of breath. 12/16/22   Pokhrel, Laxman, MD  glucose blood (ONETOUCH VERIO) test strip Use to test blood sugar once a day. 10/21/22       Scheduled Meds:  allopurinol   100 mg Oral Daily   amiodarone   150 mg Intravenous Once   enoxaparin  (LOVENOX ) injection  30 mg Subcutaneous Daily   insulin  aspart  0-5 Units Subcutaneous QHS   insulin  aspart  0-9 Units  Subcutaneous TID WC   Continuous Infusions:  amiodarone      Followed by   amiodarone      lactated ringers  100 mL/hr at 11/18/23 0813   PRN Meds: acetaminophen  **OR** acetaminophen , levalbuterol , metoprolol  tartrate, ondansetron  (ZOFRAN ) IV, oxyCODONE   Allergies:   No Known Allergies  Social History:   Social History   Socioeconomic History   Marital status: Married    Spouse name: Not on file   Number of children: 2   Years of education: Not on file   Highest education level: Not on file  Occupational History   Occupation: retired Art gallery manager  Tobacco Use   Smoking status: Former   Smokeless tobacco: Never  Vaping Use   Vaping status: Never Used  Substance and Sexual Activity   Alcohol use: No   Drug use: No   Sexual activity: Not on file  Other Topics Concern   Not on file  Social History Narrative   Not on file  Social Drivers of Corporate investment banker Strain: Not on file  Food Insecurity: No Food Insecurity (11/17/2023)   Hunger Vital Sign    Worried About Running Out of Food in the Last Year: Never true    Ran Out of Food in the Last Year: Never true  Transportation Needs: No Transportation Needs (11/17/2023)   PRAPARE - Administrator, Civil Service (Medical): No    Lack of Transportation (Non-Medical): No  Physical Activity: Not on file  Stress: Not on file  Social Connections: Socially Integrated (11/17/2023)   Social Connection and Isolation Panel    Frequency of Communication with Friends and Family: More than three times a week    Frequency of Social Gatherings with Friends and Family: More than three times a week    Attends Religious Services: More than 4 times per year    Active Member of [employer] or Organizations: Yes    Attends Engineer, structural: More than 4 times per year    Marital Status: Married  Catering manager Violence: Not At Risk (11/17/2023)   Humiliation, Afraid, Rape, and Kick questionnaire    Fear of Current or  Ex-Partner: No    Emotionally Abused: No    Physically Abused: No    Sexually Abused: No    Family History:    Family History  Problem Relation Age of Onset   Emphysema Father    Leukemia Mother    Hypertension Maternal Grandmother    Stroke Maternal Grandmother    Congestive Heart Failure Sister    Diabetes Brother    Alzheimer's disease Brother      ROS:  Please see the history of present illness.   All other ROS reviewed and negative.     Physical Exam/Data: Vitals:   11/18/23 0449 11/18/23 0900 11/18/23 0915 11/18/23 1000  BP: (!) 98/55 (!) 90/46  (!) 88/52  Pulse: 92 92    Resp: (!) 24 20    Temp: 97.8 F (36.6 C) 98.2 F (36.8 C)    TempSrc: Oral Oral    SpO2: 98% 100% 96% 96%  Weight:      Height:        Intake/Output Summary (Last 24 hours) at 11/18/2023 1108 Last data filed at 11/18/2023 0000 Gross per 24 hour  Intake 60 ml  Output 75 ml  Net -15 ml      11/17/2023    1:08 AM 06/30/2023    8:22 AM 05/21/2023    7:14 PM  Last 3 Weights  Weight (lbs) 180 lb 197 lb 12.8 oz 190 lb  Weight (kg) 81.647 kg 89.721 kg 86.183 kg     Body mass index is 25.83 kg/m.  General:  elderly male in NAD HEENT: normal Neck: + JVD Vascular: No carotid bruits; Distal pulses 2+ bilaterally Cardiac:  normal S1, S2; RRR Lungs:  clear to auscultation bilaterally, no wheezing, rhonchi or rales  Abd: soft, nontender, no hepatomegaly  Ext: 1+ B pitting edema Musculoskeletal:  No deformities, BUE and BLE strength normal and equal Skin: warm and dry  Neuro:  CNs 2-12 intact, no focal abnormalities noted Psych:  Normal affect   EKG:  The EKG was personally reviewed and demonstrates:  V- paced rhythm, HR 74 Telemetry:  Telemetry was personally reviewed and demonstrates:  appears to have had approximately 15 min of tachycardia, can't rule out Afib RVR vs VT/VF at rates in the 150-180s that self-terminated to V-paced rhythm  Relevant CV Studies:  Echo 2023: 1. TDS. Left  ventricular ejection fraction, by estimation, is 50 to 55%.  The left ventricle has low normal function. The left ventricle  demonstrates regional wall motion abnormalities (see scoring  diagram/findings for description). There is mild left  ventricular hypertrophy. Left ventricular diastolic parameters are  indeterminate. There is severe akinesis of the left ventricular, apical  anteroseptal wall.   2. Right ventricular systolic function is normal. The right ventricular  size is normal.   3. Left atrial size was moderately dilated.   4. Right atrial size was mildly dilated.   5. The mitral valve is normal in structure. No evidence of mitral valve  regurgitation. No evidence of mitral stenosis.   6. The aortic valve is calcified. There is mild calcification of the  aortic valve. There is mild thickening of the aortic valve. Aortic valve  regurgitation is not visualized. No aortic stenosis is present.   7. The inferior vena cava is normal in size with greater than 50%  respiratory variability, suggesting right atrial pressure of 3 mmHg.     LHC 2009: CONCLUSION:  1. Noncritical coronary artery disease.  2. Severely depressed left ventricular systolic function.  3. Mild mitral regurgitation.  4. Moderate-to-marked aortic root dilatation.  5. History of mild aortic insufficiency.  6. Elevated pulmonary capillary wedge pressure with moderate pulmonary      hypertension.  7. Cardiac output 3.7, cardiac index 1.8.  8. Pulmonary artery saturation of 39%, aortic saturation 94%.  9. Aortic valve area of 1.1 sq cm and aortic valve mean gradient 32      mmHg.  Laboratory Data: High Sensitivity Troponin:   Recent Labs  Lab 11/16/23 2325 11/17/23 0106 11/18/23 0437  TROPONINIHS 20* 21* 128*     Chemistry Recent Labs  Lab 11/17/23 1350 11/18/23 0048 11/18/23 0440  NA 133* 132* 132*  K 3.8 3.8 3.7  CL 101 98 99  CO2 20* 20* 19*  GLUCOSE 193* 189* 178*  BUN 43* 56* 58*   CREATININE 1.61* 2.45* 2.83*  CALCIUM  8.4* 8.5* 8.3*  MG  --   --  2.1  GFRNONAA 42* 25* 21*  ANIONGAP 12 14 14     Recent Labs  Lab 11/16/23 2325 11/17/23 1350 11/18/23 0048  PROT 6.1* 6.3* 6.1*  ALBUMIN 3.5 3.2* 3.1*  AST 26 26 44*  ALT 39 38 51*  ALKPHOS 78 80 99  BILITOT 3.0* 2.5* 2.2*   Lipids No results for input(s): CHOL, TRIG, HDL, LABVLDL, LDLCALC, CHOLHDL in the last 168 hours.  Hematology Recent Labs  Lab 11/17/23 1350 11/18/23 0048 11/18/23 0440  WBC 11.5* 8.3 12.5*  RBC 4.46 4.48 4.21*  HGB 13.2 12.9* 12.2*  HCT 39.8 40.0 38.0*  MCV 89.2 89.3 90.3  MCH 29.6 28.8 29.0  MCHC 33.2 32.3 32.1  RDW 14.6 14.6 14.8  PLT 129* 117* 108*   Thyroid No results for input(s): TSH, FREET4 in the last 168 hours.  BNPNo results for input(s): BNP, PROBNP in the last 168 hours.  DDimer No results for input(s): DDIMER in the last 168 hours.  Radiology/Studies:  DG Chest Port 1 View Result Date: 11/18/2023 CLINICAL DATA:  Aspiration. EXAM: PORTABLE CHEST 1 VIEW COMPARISON:  November 17, 2023. FINDINGS: Stable cardiomegaly. Status post aortic valve repair. Left-sided defibrillator is unchanged. Hypoinflation of the lungs is noted with minimal bibasilar subsegmental atelectasis. Bony thorax is unremarkable. IMPRESSION: Hypoinflation of the lungs with minimal bibasilar subsegmental atelectasis. Electronically Signed   By: Lynwood Seip  Jr M.D.   On: 11/18/2023 08:16   DG CHEST PORT 1 VIEW Result Date: 11/17/2023 CLINICAL DATA:  Shortness of breath. EXAM: PORTABLE CHEST 1 VIEW COMPARISON:  Same day. FINDINGS: Stable cardiomediastinal silhouette. Left-sided defibrillator is unchanged. Sternotomy wires are noted. Both lungs are clear. The visualized skeletal structures are unremarkable. IMPRESSION: No active disease. Electronically Signed   By: Lynwood Landy Raddle M.D.   On: 11/17/2023 13:36   CT ABDOMEN PELVIS W CONTRAST Result Date: 11/17/2023 CLINICAL DATA:  Acute  abdominal pain EXAM: CT ABDOMEN AND PELVIS WITH CONTRAST TECHNIQUE: Multidetector CT imaging of the abdomen and pelvis was performed using the standard protocol following bolus administration of intravenous contrast. RADIATION DOSE REDUCTION: This exam was performed according to the departmental dose-optimization program which includes automated exposure control, adjustment of the mA and/or kV according to patient size and/or use of iterative reconstruction technique. CONTRAST:  OMNIPAQUE  IOHEXOL  350 MG/ML SOLN COMPARISON:  Abdominal film from earlier in the same day. FINDINGS: Lower chest: Lung bases are free of acute infiltrate or sizable effusion. Hepatobiliary: Gallbladder has been surgically removed. Liver appears within normal limits. Pancreas: Unremarkable. No pancreatic ductal dilatation or surrounding inflammatory changes. Spleen: Normal in size without focal abnormality. Adrenals/Urinary Tract: Adrenal glands are within normal limits. Kidneys demonstrate a normal enhancement pattern bilaterally. No renal calculi or obstructive changes are noted. Delayed images demonstrate no acute abnormality. The ureters are within normal limits. The bladder is decompressed. Stomach/Bowel: No obstructive or inflammatory changes of the colon are seen. The appendix is not visualized consistent with prior surgical history. The small bowel and stomach are unremarkable. Vascular/Lymphatic: Aortic atherosclerosis. No enlarged abdominal or pelvic lymph nodes. Reproductive: Prostate is unremarkable. Other: No abdominal wall hernia or abnormality. No abdominopelvic ascites. Musculoskeletal: Fracture of the L2 transverse process on the right is noted of uncertain chronicity. Degenerative changes of the lumbar spine are noted. IMPRESSION: Fracture of the L2 transverse process on the right of uncertain chronicity. No other focal abnormality is noted. Electronically Signed   By: Oneil Devonshire M.D.   On: 11/17/2023 01:09   CT  Angio Chest PE W and/or Wo Contrast Result Date: 11/17/2023 CLINICAL DATA:  Nausea and vomiting. EXAM: CT ANGIOGRAPHY CHEST WITH CONTRAST TECHNIQUE: Multidetector CT imaging of the chest was performed using the standard protocol during bolus administration of intravenous contrast. Multiplanar CT image reconstructions and MIPs were obtained to evaluate the vascular anatomy. RADIATION DOSE REDUCTION: This exam was performed according to the departmental dose-optimization program which includes automated exposure control, adjustment of the mA and/or kV according to patient size and/or use of iterative reconstruction technique. CONTRAST:  OMNIPAQUE  IOHEXOL  350 MG/ML SOLN COMPARISON:  None Available. FINDINGS: Cardiovascular: A dual lead AICD is in place. There is marked severity calcification of the thoracic aorta. An artificial aortic valve is seen. Satisfactory opacification of the pulmonary arteries to the segmental level. No evidence of pulmonary embolism. Normal heart size with marked severity coronary artery calcification. No pericardial effusion. Mediastinum/Nodes: No enlarged mediastinal, hilar, or axillary lymph nodes. Thyroid gland, trachea, and esophagus demonstrate no significant findings. Lungs/Pleura: There is a 4 mm posterolateral right upper lobe calcified lung nodule (axial CT image 57, CT series 4). 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 Mild lingular and mild bibasilar atelectasis is seen. No pleural effusion or pneumothorax is identified. Upper Abdomen: Multiple surgical clips are seen within the gallbladder fossa. A mild amount of central pneumobilia is seen. This is decreased in  severity when compared to the prior study. Musculoskeletal: Multiple sternal wires are present. Postoperative changes are seen within the visualized portion of the lower cervical spine. An acute, mildly displaced posterior eleventh right rib fracture is seen (axial CT  images 136 through 139, CT series 4). Review of the MIP images confirms the above findings. IMPRESSION: 1. No evidence of pulmonary embolism. 2. Acute, mildly displaced posterior eleventh right rib fracture. 3. Mild lingular and mild bibasilar atelectasis. 4. Evidence of prior cholecystectomy. 5. Aortic atherosclerosis. Electronically Signed   By: Suzen Dials M.D.   On: 11/17/2023 01:07   DG Chest Portable 1 View Result Date: 11/17/2023 CLINICAL DATA:  Nausea and vomiting and abdominal pain, initial encounter EXAM: PORTABLE CHEST 1 VIEW COMPARISON:  12/15/2022 FINDINGS: Stable cardiomegaly is noted. Defibrillator is again seen and stable. Postsurgical changes in the cervical spine are noted. No focal infiltrate or effusion is seen. No acute bony abnormality is noted. IMPRESSION: No acute abnormality noted. Electronically Signed   By: Oneil Devonshire M.D.   On: 11/17/2023 00:30   DG Abd Portable 1 View Result Date: 11/17/2023 CLINICAL DATA:  Nausea and vomiting and abdominal pain for 1 day, initial encounter EXAM: PORTABLE ABDOMEN - 1 VIEW COMPARISON:  12/13/2022 FINDINGS: Scattered large and small bowel gas is noted. No obstructive changes are seen. No free air is noted. No abnormal mass or abnormal calcifications are noted. Degenerative change of the lumbar spine. IMPRESSION: No acute abnormality noted. Electronically Signed   By: Oneil Devonshire M.D.   On: 11/17/2023 00:29     Assessment and Plan:  Tachycardia - noted overnight Afib RVR vs VT/VF - Mg 2.1, K 3.7 - below threshold for ICD shock if ventricular arhythmia - initially started amiodarone , but now discontinued as he is not anticoagulated and has a of history of Afib  - echo ordered - will need stat device interrogation   Persistent Afib - persistent Afib on device - no longer on OAC - maintained on coreg  - continue this   NICM Hypertension - heart cath in 2009 prior to sternotomy with no obstructive disease, no recent ischemic  evaluation - last echo with low normal LVEF 50-55% - GDMT with lisinopril  40 mg, coreg  12.5 mg BID, spironolactone  12.5 mg, 10 mg farxiga , and daily lasix  40 mg - has been receivign IVF for acute on chronic kidney disease   Elevated troponin - hs troponin 128, delta pending - no chest pain - likely related to GI illness, expect this to rise given ventricular arrhythmia - EKG with paced rhythm - holding off on heparin for now   Acute on chronic renal insufficiency - baseline sCr 1.3, now 2.83 - holding home ACEI/MRA   Plan to transfer urgently to [facility] with general cardiology and EP - needs echo and device interrogation. Continue BB and amiodarone . Hold lisinopril  and spironolactone  given renal function, may ultimately need entresto.   Risk Assessment/Risk Scores:     CHA2DS2-VASc Score = 5   This indicates a 7.2% annual risk of stroke. The patient's score is based upon: CHF History: 1 HTN History: 1 Diabetes History: 1 Stroke History: 0 Vascular Disease History: 0 Age Score: 2 Gender Score: 0      For questions or updates, please contact [facility] Please consult [email or web address] for contact info under    Signed, Jon Nat Hails, PA  11/18/2023 11:08 AM  History and all data above reviewed.  I personally took the history today, performed the physical exam and  formulated the substantive portion of the assessment and plan. assessment and plan.  I reviewed all relevant tests and studies. Patient examined.  I agree with the findings as above.  He presented with abdominal pain.  He developed acute weakness.  He was not found to have any acute findings on abdominal CT.  He is being treated for presumed sepsis although his lactic acid is not elevated and procalcitonin is normal.  He does have a very mildly elevated flat troponin.    Overnight he developed confusion, He has been hypotensive and developed wide complex tachycardia.   He is sitting in the chair currently  and is saying that he has pain where he fell at home and feeling a little more congested.  He is not confused and has no chest pain.  He is not feeling palpitations.   Prior to presentation he was doing OK.  He was not having any acute cardiovascular complaints.  The patient denies any new symptoms such as chest discomfort, neck or arm discomfort. There has been no new shortness of breath, PND or orthopnea. There have been no reported palpitations, presyncope or syncope.  Preliminary EF OK on echo.  Final report pending.   The patient exam reveals RNM:Pmmzhlojm   ,  Lungs: Decreased breath sounds left base greater than right  ,  Abd: No tenderness, NL bowel sounds, Ext No edema.    .    All available labs, radiology testing, previous records reviewed. Agree with documented assessment and plan.   Wide complex tachycardia:   Atrial flutter on device interrogation.   (I don't see the interrogation but Dr. Cindie looked at strips and there is communication from the Device rep).   IV amio is now off.  Resume if he has any further tachyarrhythmia.  It is noted in Dr. Vertie notes that he declined anticoagulation in the past despite understanding the risk of stroke.   Cardiomyopathy:   BP is low.  Meds have been held because of hypotension.    Will slowly titrate back as his BP allows.    AKI:  Likely ATN secondary to hypotension.  Hold meds as above.  Supportive care.  I spoke with nursing who is going to do a bladder scan because he has not had any recent urine out put.  I spoke with  Dr. Patsy about my concerns around evolving oliguric AKI.  He will consider consulting nephrology.      Lynwood Jaquae Rieves  2:38 PM  11/18/2023

## 2023-11-18 NOTE — Care Management Obs Status (Signed)
 MEDICARE OBSERVATION STATUS NOTIFICATION   Patient Details  Name: [name] MRN: [id number] Date of Birth: [birth year]   Medicare Observation Status Notification Given:  Yes    Duwaine GORMAN Aran, LCSW 11/18/2023, 9:10 AM

## 2023-11-18 NOTE — Evaluation (Signed)
 Physical Therapy Evaluation Patient Details Name: [name] MRN: [id number] DOB: 21-Jan-1941 Today's Date: 11/18/2023  History of Present Illness  [name] is a 83 y.o. male presenting on 11/16/23 with complaints of abdominal pain and vomiting. Pt with episode of confusion, shaking, hypoxia, and tachycardia overnight and seen by cardiology 8/15 for the evaluation of elevated troponin.  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  Pt currently with functional limitations due to the deficits listed below (see PT Problem List). Pt will benefit from acute skilled PT to increase their independence and safety with mobility to allow discharge.  Pt in recliner on arrival and RN requesting pt back in bed for bladder scan.  Pt agreeable for return to bed however declined ambulation today.  Pt reports he was independent prior to admission however appears more fearful of mobility and falling since he had shaking episodes.  Pt also requiring at least min assist for transfers at this time and reliant on UE support.  Pt on 2L O2 [place] and SpO2 95-96% during session.  HR 75 - 91 - 82 bpm during session, and RN reports no issues with rate or rhythm today (during the day).  Pt reports he lives with his wife, but she is not likely to be able to provide current assist.  Pt hopeful to feel better and be able to ambulate in order to return home upon d/c.  If pt continues to require physical assist, patient would benefit from continued inpatient follow up therapy, <3 hours/day.          If plan is discharge home, recommend the following: A lot of help with walking and/or transfers;A lot of help with bathing/dressing/bathroom;Assistance  with cooking/housework;Help with stairs or ramp for entrance   Can travel by private vehicle        Equipment Recommendations None recommended by PT  Recommendations for Other Services       Functional Status Assessment Patient has had a recent decline in their functional status and demonstrates the ability to make significant improvements in function in a reasonable and predictable amount of time.     Precautions / Restrictions Precautions Precautions: Fall Precaution/Restrictions Comments: monitor HR and sats      Mobility  Bed Mobility Overal bed mobility: Needs Assistance Bed Mobility: Sit to Supine       Sit to supine: Mod assist   General bed mobility comments: assist for LEs onto bed    Transfers Overall transfer level: Needs assistance Equipment used: None Transfers: Sit to/from Stand, Bed to chair/wheelchair/BSC Sit to Stand: Min assist   Step pivot transfers: Min assist       General transfer comment: verbal cues for technique, assist to rise and stabilize; pt declined need for RW however once standing required bil UE support, provided 1 HHA and pt utilized armrests/bed rail; assist for stabilizing during step pivot; remained on 2L O2 [place], SPO2 95%, HR 91 bpm    Ambulation/Gait               General Gait Details: pt declined at this time  Stairs            Wheelchair Mobility     Tilt Bed    Modified Rankin (Stroke Patients Only)  Balance Overall balance assessment: Needs assistance         Standing balance support: Bilateral upper extremity supported, During functional activity Standing balance-Leahy Scale: Poor Standing balance comment: reliant on bil UE support                             Pertinent Vitals/Pain Pain Assessment Pain Assessment: Faces Faces Pain Scale: Hurts little more Pain Location: bottom from sitting in recliner too long Pain Descriptors / Indicators: Sore Pain Intervention(s):  Repositioned, Monitored during session    Home Living Family/patient expects to be discharged to:: Private residence Living Arrangements: Spouse/significant other Available Help at Discharge: Family Type of Home: House Home Access: Stairs to enter   Secretary/administrator of Steps: 2   Home Layout: One level Home Equipment: Agricultural consultant (2 wheels);Shower seat;Crutches;Grab bars - tub/shower;Adaptive equipment      Prior Function Prior Level of Function : Independent/Modified Independent;Driving             Mobility Comments: no AD ADLs Comments: Ind with ADLs/selfcare     Extremity/Trunk Assessment        Lower Extremity Assessment Lower Extremity Assessment: Generalized weakness    Cervical / Trunk Assessment Cervical / Trunk Assessment: Normal  Communication   Communication Communication: No apparent difficulties    Cognition Arousal: Alert Behavior During Therapy: WFL for tasks assessed/performed   PT - Cognitive impairments: No apparent impairments                         Following commands: Intact       Cueing       General Comments      Exercises     Assessment/Plan    PT Assessment Patient needs continued PT services  PT Problem List Decreased strength;Decreased activity tolerance;Decreased balance;Decreased mobility;Decreased knowledge of use of DME       PT Treatment Interventions Gait training;DME instruction;Balance training;Functional mobility training;Therapeutic activities;Therapeutic exercise;Patient/family education    PT Goals (Current goals can be found in the Care Plan section)  Acute Rehab PT Goals PT Goal Formulation: With patient Time For Goal Achievement: 12/02/23 Potential to Achieve Goals: Good    Frequency Min 3X/week     Co-evaluation               AM-PAC PT 6 Clicks Mobility  Outcome Measure Help needed turning from your back to your side while in a flat bed without using bedrails?: A  Lot Help needed moving from lying on your back to sitting on the side of a flat bed without using bedrails?: A Lot Help needed moving to and from a bed to a chair (including a wheelchair)?: A Lot Help needed standing up from a chair using your arms (e.g., wheelchair or bedside chair)?: A Lot Help needed to walk in hospital room?: A Lot Help needed climbing 3-5 steps with a railing? : Total 6 Click Score: 11    End of Session Equipment Utilized During Treatment: Gait belt Activity Tolerance: Patient limited by fatigue Patient left: in bed;with call bell/phone within reach;with nursing/sitter in room (telesitter, floor mat)   PT Visit Diagnosis: Difficulty in walking, not elsewhere classified (R26.2);Unsteadiness on feet (R26.81)    Time: 8472-8458 PT Time Calculation (min) (ACUTE ONLY): 14 min   Charges:   PT Evaluation $PT Eval Low Complexity: 1 Low   PT General Charges $$ ACUTE PT VISIT: 1 Visit  Tari KLEIN, DPT Physical Therapist Acute Rehabilitation Services Office: [phone]   Tari CROME [place] 11/18/2023, 4:42 PM

## 2023-11-18 NOTE — Progress Notes (Signed)
  Echocardiogram 2D Echocardiogram has been performed.  Koleen KANDICE Popper, RDCS 11/18/2023, 1:01 PM

## 2023-11-18 NOTE — Progress Notes (Addendum)
   11/17/23 2251  Assess: MEWS Score  Temp 99.8 F (37.7 C)  BP (!) 88/64  MAP (mmHg) 69  Pulse Rate 67  Resp (!) 28  SpO2 98 %  O2 Device Nasal Cannula  O2 Flow Rate (L/min) 5 L/min  Assess: MEWS Score  MEWS Temp 0  MEWS Systolic 1  MEWS Pulse 0  MEWS RR 2  MEWS LOC 0  MEWS Score 3  MEWS Score Color Yellow  Assess: if the MEWS score is Yellow or Red  Were vital signs accurate and taken at a resting state? Yes  Does the patient meet 2 or more of the SIRS criteria? No  MEWS guidelines implemented  Yes, yellow  Treat  MEWS Interventions Considered administering scheduled or prn medications/treatments as ordered  Take Vital Signs  Increase Vital Sign Frequency  Yellow: Q2hr x1, continue Q4hrs until patient remains green for 12hrs  Escalate  MEWS: Escalate Yellow: Discuss with charge nurse and consider notifying provider and/or RRT  Notify: Charge Nurse/RN  Name of Charge Nurse/RN Notified lauren Hopkins, RN  Provider Notification  Provider Name/Title Lavanda Horns, NP  Date Provider Notified 11/17/23  Time Provider Notified 2259  Method of Notification  (secure chat)  Notification Reason Other (Comment) (yellow MEWS)  Date of Provider Response 11/17/23  Assess: SIRS CRITERIA  SIRS Temperature  0  SIRS Respirations  1  SIRS Pulse 0  SIRS WBC 0  SIRS Score Sum  1   Pt called regarding shaking. Vital taken. On call provider and CN notified.   Update: pt's shaking and tachycardia were worsening. HR elevated 150s and SpO2 suddenly dropped to 72% on 5L [place].Nonrebreather on. Rapid team and On Call provider notified.  Rapid and On call provider at bedside. PRN metoprolol  was given and Zofran  given for projectile vomiting. Pt on 6L HFNC.  Pt confused and attempted to get out of bed. Tele sitter placed per order.   Pt now Alert and oriented to X4. Will continue to monitor.

## 2023-11-18 NOTE — Progress Notes (Signed)
     Patient Name: DAWOOD SPITLER           DOB: 02-08-1941  MRN: [id number]      Admission Date: 11/16/2023  Attending Provider: Patsy Lenis, MD  Primary Diagnosis: Generalized abdominal pain   Level of care: Progressive   OVERNIGHT EVENT   Rapid response- shivering, tachycardia, hypoxic.   HR as high as 160 BPM, SpO2 72% on 5 L [place].   At bedside, patient is mildly confused but able to follow commands.  He appears uncomfortable and is reporting nausea.  He proceeds to vomit which gives him some relief.  During this episode of emesis, he had difficulty clearing his own airway and required oral suctioning, there is some concern for aspiration.   Patient denies any chest discomfort, palpitations, abdominal pain.  He reports shortness of breath and nausea.   Vital signs have improved.  HR 110s, O2 has been titrated down to 6 L HFNC with optimal saturation.  No shivering on my assessment. Temp normal.  Breath sounds clear, no wheezing or crackles.   He remained somewhat confused despite being reoriented and is attempting to get out of bed.  He is weak and a fall risk.   Will minimize sedative agents given concern for aspiration.     Plan: Check morning labs-CBC, CMP, lactic acid, VBG EKG - HR 110s and V paced rhythm.  Chest x-ray Telesitter Temporary Posey belt use RN to continue reorienting patient    Lavanda Horns, DNP, ACNPC- AG [facility] [place]

## 2023-11-19 ENCOUNTER — Inpatient Hospital Stay (HOSPITAL_COMMUNITY)

## 2023-11-19 DIAGNOSIS — N189 Chronic kidney disease, unspecified: Secondary | ICD-10-CM

## 2023-11-19 DIAGNOSIS — N179 Acute kidney failure, unspecified: Secondary | ICD-10-CM | POA: Diagnosis not present

## 2023-11-19 DIAGNOSIS — I4891 Unspecified atrial fibrillation: Secondary | ICD-10-CM

## 2023-11-19 DIAGNOSIS — I509 Heart failure, unspecified: Secondary | ICD-10-CM

## 2023-11-19 DIAGNOSIS — R1084 Generalized abdominal pain: Secondary | ICD-10-CM | POA: Diagnosis not present

## 2023-11-19 DIAGNOSIS — R6510 Systemic inflammatory response syndrome (SIRS) of non-infectious origin without acute organ dysfunction: Secondary | ICD-10-CM | POA: Diagnosis not present

## 2023-11-19 LAB — MAGNESIUM: Magnesium: 2.3 mg/dL (ref 1.7–2.4)

## 2023-11-19 LAB — CBC WITH DIFFERENTIAL/PLATELET
Abs Immature Granulocytes: 0.1 K/uL — ABNORMAL HIGH (ref 0.00–0.07)
Basophils Absolute: 0 K/uL (ref 0.0–0.1)
Basophils Relative: 0 %
Eosinophils Absolute: 0.2 K/uL (ref 0.0–0.5)
Eosinophils Relative: 2 %
HCT: 36.3 % — ABNORMAL LOW (ref 39.0–52.0)
Hemoglobin: 11.8 g/dL — ABNORMAL LOW (ref 13.0–17.0)
Immature Granulocytes: 1 %
Lymphocytes Relative: 3 %
Lymphs Abs: 0.3 K/uL — ABNORMAL LOW (ref 0.7–4.0)
MCH: 29.4 pg (ref 26.0–34.0)
MCHC: 32.5 g/dL (ref 30.0–36.0)
MCV: 90.3 fL (ref 80.0–100.0)
Monocytes Absolute: 1 K/uL (ref 0.1–1.0)
Monocytes Relative: 10 %
Neutro Abs: 8.4 K/uL — ABNORMAL HIGH (ref 1.7–7.7)
Neutrophils Relative %: 84 %
Platelets: 103 K/uL — ABNORMAL LOW (ref 150–400)
RBC: 4.02 MIL/uL — ABNORMAL LOW (ref 4.22–5.81)
RDW: 14.6 % (ref 11.5–15.5)
WBC: 10.1 K/uL (ref 4.0–10.5)
nRBC: 0 % (ref 0.0–0.2)

## 2023-11-19 LAB — GLUCOSE, CAPILLARY
Glucose-Capillary: 172 mg/dL — ABNORMAL HIGH (ref 70–99)
Glucose-Capillary: 194 mg/dL — ABNORMAL HIGH (ref 70–99)
Glucose-Capillary: 193 mg/dL — ABNORMAL HIGH (ref 70–99)
Glucose-Capillary: 200 mg/dL — ABNORMAL HIGH (ref 70–99)

## 2023-11-19 LAB — URINALYSIS, ROUTINE W REFLEX MICROSCOPIC
Bilirubin Urine: NEGATIVE
Glucose, UA: 50 mg/dL — AB
Hgb urine dipstick: NEGATIVE
Ketones, ur: NEGATIVE mg/dL
Leukocytes,Ua: NEGATIVE
Nitrite: NEGATIVE
Protein, ur: NEGATIVE mg/dL
Specific Gravity, Urine: 1.013 (ref 1.005–1.030)
pH: 5 (ref 5.0–8.0)

## 2023-11-19 LAB — BASIC METABOLIC PANEL WITH GFR
Anion gap: 12 (ref 5–15)
BUN: 71 mg/dL — ABNORMAL HIGH (ref 8–23)
CO2: 22 mmol/L (ref 22–32)
Calcium: 8.1 mg/dL — ABNORMAL LOW (ref 8.9–10.3)
Chloride: 98 mmol/L (ref 98–111)
Creatinine, Ser: 3.53 mg/dL — ABNORMAL HIGH (ref 0.61–1.24)
GFR, Estimated: 16 mL/min — ABNORMAL LOW (ref >60)
Glucose, Bld: 152 mg/dL — ABNORMAL HIGH (ref 70–99)
Potassium: 3.9 mmol/L (ref 3.5–5.1)
Sodium: 132 mmol/L — ABNORMAL LOW (ref 135–145)

## 2023-11-19 MED ORDER — LACTATED RINGERS IV SOLN: INTRAVENOUS | Status: DC

## 2023-11-19 NOTE — Progress Notes (Signed)
 Progress Note    [name]   FMW:[id number]  DOB: 07-Apr-1940  DOA: 11/16/2023     1 PCP: Vernadine Charlie ORN, MD  Initial CC: abd pain, vomiting  Hospital Course: [name] is a 83 y.o. 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   Patient states he had a [place] salad at [employer] for dinner on 8/12.  The following day 8/13 he started having generalized abdominal pain and was passing a lot of gas.  He had a few episodes of vomiting at home which started after he tried taking Pepto-Bismol.  Reports temperature of 101 F at home and took Tylenol  prior to coming into the ED.  Patient states she just before EMS arrived, as he was trying to get up from his couch, his legs felt weak and he fell on his right side hitting his posterior ribs against the corner of a table and since then this area feels sore.  Denies head injury or loss of consciousness.  He reports history of chronic lumbar vertebral fracture for which he is seen by orthopedics and was told that no surgery was needed.  He is not having any pain in his back.    He was admitted for further workup.  CT abdomen/pelvis was negative for acute abnormalities.  Underlying old L2 transverse process fracture noted. CT angio chest negative for PE and showed acute mildly displaced posterior 11th right rib fracture.  Symptoms of nausea improved and he tolerated a diet well.   Assessment and Plan   Elevated troponin Wide-complex tachycardia - given recurrent episodes of tachycardia and shaking in context of diaporesis, N/V, and abd pain on admission, so concern for cardiac etiology - initial trop on admission was flat (20>>21); repeated 8/15 and  elevated further 128 (no CP complaints); EKG is V-paced  - asked for cardiology input given symptoms  - evaluated by EP; pacer also interrogated and notes <4 mths battery life and no alarming events recently - trop peaked at 128, then downtrended; echo shows preserved EF, 50 to 55%, mild LVH, indeterminate diastolic parameters - Cardiology following  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 Baseline creat ~ 1.3, eGFR~ 50 - 55 - initial creat 1.2 on admission, but 1.61 the morning after and still uptrending - differential includes prolonged pre-renal +/- ATN due to this vs hypoperfusion (prolonged hypotension vs cardiogenic shock) - echo negative for reduced function - FeNa technically 0.1%, will check UA as well - CT A/P from 8/14 shows no obstructive changes and ureters within normal limits, decompressed bladder - bladder scan negative for retention on 8/15 - creat still worse 8/16; will ask for nephrology input - no acute indication for HD at this time however; discussed plan at length in room with patient and wife  Generalized abdominal pain, nausea, vomiting SIRS - Suspecting possible viral illness precipitating nausea/vomiting.  He had no complaints of diarrhea -If does develop diarrhea, will send for testing -UA negative for signs of infection - CBC improved without abx - continue monitoring off abx  Acute hypoxia - CT angio chest negative for PE; no obvious infiltrates or effusion - Known new right 11th rib fracture and does have some mild bibasilar atelectasis already seen on CT angio chest -Possible progression of  atelectasis in setting of rib fracture - continue O2 for now and wean as able - serial CXR still negative for edema, effusions, infiltrate; mostly hypoinflation and atelectasis  - wean O2 as able   History of nonischemic cardiomyopathy Last echo done in September 2023 showing improvement of EF to 50 to 55%. - Initially resumed Coreg  after admission but  blood pressure dropped and discontinued once again -Also holding Lasix , lisinopril , spironolactone  for now; resuming as able -Repeat echo reassuring with preserved EF  Paroxysmal A-fib/flutter Not on anticoagulation.   -Trying to resume beta-blocker as able   Mild thrombocytopenia No signs of bleeding.  Monitor CBC.   Acute mildly displaced posterior 11th right rib fracture - Secondary to mechanical fall at home prior to ED arrival.  Patient is reporting mild pain/soreness in this area.  - Continue pain management   L2 transverse process fracture on the right of uncertain chronicity Seen on CT.  Per conversation with the patient, it seems this might be a chronic finding and he is seen by orthopedics on an outpatient basis.  He denies any back pain at this time.   Type 2 diabetes - Last A1c 7.4 in September 2024, repeat ordered.  Placed on sensitive sliding scale insulin  ACHS.   Gout - hold allopurinol  for now in setting of worsening renal function   Hypertension - most meds on hold for now; was hypotensive initially   Interval History:  Did not have any further episodes overnight of tachycardia, shaking, diaphoresis. Sitting in recliner eating breakfast this morning feeling well.  Having urine output that is clear yellow.  Old records reviewed in assessment of this patient  Antimicrobials:   DVT prophylaxis:  enoxaparin  (LOVENOX ) injection 30 mg Start: 11/18/23 1000   Code Status:   Code Status: Full Code  Mobility Assessment (Last 72 Hours)     Mobility Assessment     Row Name 11/19/23 0720 11/18/23 2030 11/18/23 1606 11/18/23 0815 11/17/23 2000   Does the patient have exclusion criteria? No - Perform mobility assessment No - Perform mobility assessment -- No - Perform mobility assessment No - Perform mobility assessment   What is the highest level of mobility based on the mobility assessment? Level 3 (Stands with assistance) - Balance while standing  and cannot march  in place -- Level 3 (Stands with assistance) - Balance while standing  and cannot march in place Level 4 (Ambulates with assistance) - Balance while stepping forward/back - Complete Level 4 (Ambulates with assistance) - Balance while stepping forward/back - Complete    Row Name 11/17/23 1513 11/17/23 1214         Does the patient have exclusion criteria? No - Perform mobility assessment --      What is the highest level of mobility based on the mobility assessment? Level 4 (Ambulates with assistance) - Balance while stepping forward/back - Complete Level 5 (Ambulates independently) - Balance while walking independently - Complete         Barriers to discharge: None Disposition Plan: Home HH orders placed: N/A Status is: Inpatient  Objective: Blood pressure (!) 107/59, pulse 91, temperature 97.9 F (36.6 C), temperature source Oral, resp. rate (!) 24, height 5' 10 (1.778 m), weight 81.6 kg, SpO2 98%.  Examination:  Physical Exam Constitutional:      General: He is not in acute distress.    Appearance: Normal appearance.  HENT:     Head: Normocephalic and atraumatic.     Mouth/Throat:  Mouth: Mucous membranes are moist.  Eyes:     Extraocular Movements: Extraocular movements intact.  Cardiovascular:     Rate and Rhythm: Normal rate. Rhythm irregular.  Pulmonary:     Effort: Pulmonary effort is normal. No respiratory distress.     Breath sounds: Normal breath sounds. No wheezing.     Comments: Mild abrasion along lower right back Abdominal:     General: Bowel sounds are normal. There is no distension.     Palpations: Abdomen is soft.     Tenderness: There is no abdominal tenderness.  Musculoskeletal:        General: Normal range of motion.     Cervical back: Normal range of motion and neck supple.     Right lower leg: Edema (1+) present.     Left lower leg: Edema (1+) present.  Skin:    General: Skin is warm and dry.  Neurological:     General: No focal deficit  present.     Mental Status: He is alert.  Psychiatric:        Mood and Affect: Mood normal.        Behavior: Behavior normal.      Consultants:  Cardiology Nephrology  Procedures:    Data Reviewed: Results for orders placed or performed during the hospital encounter of 11/16/23 (from the past 24 hours)  Glucose, capillary     Status: Abnormal   Collection Time: 11/18/23  4:52 PM  Result Value Ref Range   Glucose-Capillary 191 (H) 70 - 99 mg/dL  Sodium, urine, random     Status: None   Collection Time: 11/18/23  5:14 PM  Result Value Ref Range   Sodium, Ur 11 mmol/L  Creatinine, urine, random     Status: None   Collection Time: 11/18/23  5:14 PM  Result Value Ref Range   Creatinine, Urine 230 mg/dL  Glucose, capillary     Status: Abnormal   Collection Time: 11/18/23  9:01 PM  Result Value Ref Range   Glucose-Capillary 216 (H) 70 - 99 mg/dL  Basic metabolic panel with GFR     Status: Abnormal   Collection Time: 11/19/23  4:39 AM  Result Value Ref Range   Sodium 132 (L) 135 - 145 mmol/L   Potassium 3.9 3.5 - 5.1 mmol/L   Chloride 98 98 - 111 mmol/L   CO2 22 22 - 32 mmol/L   Glucose, Bld 152 (H) 70 - 99 mg/dL   BUN 71 (H) 8 - 23 mg/dL   Creatinine, Ser 6.46 (H) 0.61 - 1.24 mg/dL   Calcium  8.1 (L) 8.9 - 10.3 mg/dL   GFR, Estimated 16 (L) >60 mL/min   Anion gap 12 5 - 15  CBC with Differential/Platelet     Status: Abnormal   Collection Time: 11/19/23  4:39 AM  Result Value Ref Range   WBC 10.1 4.0 - 10.5 K/uL   RBC 4.02 (L) 4.22 - 5.81 MIL/uL   Hemoglobin 11.8 (L) 13.0 - 17.0 g/dL   HCT 63.6 (L) 60.9 - 47.9 %   MCV 90.3 80.0 - 100.0 fL   MCH 29.4 26.0 - 34.0 pg   MCHC 32.5 30.0 - 36.0 g/dL   RDW 85.3 88.4 - 84.4 %   Platelets 103 (L) 150 - 400 K/uL   nRBC 0.0 0.0 - 0.2 %   Neutrophils Relative % 84 %   Neutro Abs 8.4 (H) 1.7 - 7.7 K/uL   Lymphocytes Relative 3 %   Lymphs Abs 0.3 (  L) 0.7 - 4.0 K/uL   Monocytes Relative 10 %   Monocytes Absolute 1.0 0.1 - 1.0  K/uL   Eosinophils Relative 2 %   Eosinophils Absolute 0.2 0.0 - 0.5 K/uL   Basophils Relative 0 %   Basophils Absolute 0.0 0.0 - 0.1 K/uL   Immature Granulocytes 1 %   Abs Immature Granulocytes 0.10 (H) 0.00 - 0.07 K/uL  Magnesium      Status: None   Collection Time: 11/19/23  4:39 AM  Result Value Ref Range   Magnesium  2.3 1.7 - 2.4 mg/dL  Glucose, capillary     Status: Abnormal   Collection Time: 11/19/23  7:36 AM  Result Value Ref Range   Glucose-Capillary 172 (H) 70 - 99 mg/dL  Urinalysis, Routine w reflex microscopic -Urine, Clean Catch     Status: Abnormal   Collection Time: 11/19/23 10:24 AM  Result Value Ref Range   Color, Urine YELLOW YELLOW   APPearance HAZY (A) CLEAR   Specific Gravity, Urine 1.013 1.005 - 1.030   pH 5.0 5.0 - 8.0   Glucose, UA 50 (A) NEGATIVE mg/dL   Hgb urine dipstick NEGATIVE NEGATIVE   Bilirubin Urine NEGATIVE NEGATIVE   Ketones, ur NEGATIVE NEGATIVE mg/dL   Protein, ur NEGATIVE NEGATIVE mg/dL   Nitrite NEGATIVE NEGATIVE   Leukocytes,Ua NEGATIVE NEGATIVE  Glucose, capillary     Status: Abnormal   Collection Time: 11/19/23 12:40 PM  Result Value Ref Range   Glucose-Capillary 194 (H) 70 - 99 mg/dL    I have reviewed pertinent nursing notes, vitals, labs, and images as necessary. I have ordered labwork to follow up on as indicated.  I have reviewed the last notes from staff over past 24 hours. I have discussed patient's care plan and test results with nursing staff, CM/SW, and other staff as appropriate.  Time spent: Greater than 50% of the 55 minute visit was spent in counseling/coordination of care for the patient as laid out in the A&P.   LOS: 1 day   Alm Apo, MD [facility] 11/19/2023, 2:19 PM

## 2023-11-19 NOTE — Progress Notes (Signed)
 Progress Note  Patient Name: [name] Date of Encounter: 11/19/2023  Primary Cardiologist: Redell Shallow, MD   Subjective   Feels great. Eager to go home.  Inpatient Medications    Scheduled Meds:  allopurinol   100 mg Oral Daily   enoxaparin  (LOVENOX ) injection  30 mg Subcutaneous Daily   insulin  aspart  0-5 Units Subcutaneous QHS   insulin  aspart  0-9 Units Subcutaneous TID WC   Continuous Infusions:  PRN Meds: acetaminophen  **OR** acetaminophen , levalbuterol , metoprolol  tartrate, ondansetron  (ZOFRAN ) IV, oxyCODONE    Vital Signs    Vitals:   11/18/23 1721 11/18/23 2100 11/19/23 0033 11/19/23 0449  BP: (!) 102/51 (!) 110/58 110/60 (!) 107/59  Pulse: 75 88 91 91  Resp: 20 (!) 32 (!) 26 (!) 24  Temp: 98.1 F (36.7 C) 98.5 F (36.9 C) 98 F (36.7 C) 97.9 F (36.6 C)  TempSrc:  Oral Oral Oral  SpO2: 98% 97% 97% 98%  Weight:      Height:        Intake/Output Summary (Last 24 hours) at 11/19/2023 0933 Last data filed at 11/19/2023 0543 Gross per 24 hour  Intake 2194.79 ml  Output 470 ml  Net 1724.79 ml   Filed Weights   11/17/23 0108  Weight: 81.6 kg    Telemetry    Atrial fibrillation, rare V-pacing - Personally Reviewed Cardiac monitoring strips reviewed these show a rapid, wide-complex rhythm   ECG    V paced rhythm- Personally Reviewed  Physical Exam   GEN: No acute distress.   Neck: No JVD Cardiac: RRR, no murmurs, rubs, or gallops.  Respiratory: Clear to auscultation bilaterally. GI: Soft, nontender, non-distended  MS: No edema; No deformity. Neuro:  Nonfocal  Psych: Normal affect   Labs    Chemistry Recent Labs  Lab 11/16/23 2325 11/17/23 1350 11/18/23 0048 11/18/23 0440 11/19/23 0439  NA 135 133* 132* 132* 132*  K 3.5 3.8 3.8 3.7 3.9  CL 102 101 98 99 98  CO2 23 20* 20* 19* 22  GLUCOSE 225* 193* 189* 178* 152*  BUN 37* 43* 56* 58* 71*  CREATININE 1.20 1.61* 2.45* 2.83* 3.53*  CALCIUM  8.9 8.4* 8.5* 8.3* 8.1*  PROT  6.1* 6.3* 6.1*  --   --   ALBUMIN 3.5 3.2* 3.1*  --   --   AST 26 26 44*  --   --   ALT 39 38 51*  --   --   ALKPHOS 78 80 99  --   --   BILITOT 3.0* 2.5* 2.2*  --   --   GFRNONAA >60 42* 25* 21* 16*  ANIONGAP 10 12 14 14 12      Hematology Recent Labs  Lab 11/18/23 0048 11/18/23 0440 11/19/23 0439  WBC 8.3 12.5* 10.1  RBC 4.48 4.21* 4.02*  HGB 12.9* 12.2* 11.8*  HCT 40.0 38.0* 36.3*  MCV 89.3 90.3 90.3  MCH 28.8 29.0 29.4  MCHC 32.3 32.1 32.5  RDW 14.6 14.8 14.6  PLT 117* 108* 103*    Cardiac EnzymesNo results for input(s): TROPONINI in the last 168 hours. No results for input(s): TROPIPOC in the last 168 hours.   BNPNo results for input(s): BNP, PROBNP in the last 168 hours.   DDimer No results for input(s): DDIMER in the last 168 hours.   Summary of Pertinent studies    TTE: 11/18/2023 -LVEF 50 to 55%.  Mild concentric LVH.   Patient Profile     83 y.o. male with a hx  of bicuspid aortic valve s/p AVR and MAZE for PAF, replacement of ascending aortic aneurysm, s/p CRT-D 2011, NICM with LVEF 20% on LHC prior to sternotomy with improved LVEF to 55% who was admitted for abdominal pain and emesis.  Cardiology was initially consulted yesterday for the evaluation of elevated troponin at the request of Dr. Patsy.  Hospital course complicated by wide-complex tachycardia    Assessment & Plan    Wide-complex tachycardia I have not been able to locate his interrogation -- it was reviewed by Dr. Cindie and felt to be atrial-driven rather than VT Device appears to be functioning normally.  VT therapies are programmed at a rate of 180 bpm and higher  Longstanding persistent atrial flutter  Low atrial amplitude on EKG, fibrillation can't be excluded; device EGMs are consistent with flutter Patient has declined anticoagulation per prior notes Rates are usually well-controlled  CHFrecoveredEF Appears reasonably well compensated today Resume GDMT as tolerated Pt  is not receiving adequate CRT Would resume betablocker (metoprolol  tartrate) at low dose  [employer] CRT-D Device functioning normally Battery longevity less than 3 months  Elevated troponin Troponin is mildly elevated, commensurate with acute illness; low suspicion for ACS  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 1.2 on admission -> 3.5 GDMT held  For questions or updates, please contact [facility] Please consult [email or web address] for contact info under Cardiology/STEMI.      Signed, Eulas FORBES Furbish, MD 11/19/2023, 9:33 AM

## 2023-11-19 NOTE — Consult Note (Addendum)
 Renal Service Consult Note [facility]  Blaize Epple [facility] 11/19/2023 Lamar JONETTA Fret, MD Requesting Physician: Dr. Patsy  Reason for Consult: Renal failure HPI: The patient is a 83 y.o. year-old w/ PMH as below who presented on 11/16/2023 to ED complaining of nausea and vomiting for at least 24 hours after eating a salad at a restaurant.  He had 1 time in the past had E. coli in his bloodstream and that felt similar.  He had 100.6 temperature at home.  He had a past history of porcine AVR, PAF, ICM with last EF 50 to 55%, ICD, diabetes type 2, hypertension, OSA.  The next day he began to have generalized abdominal pain and continued vomiting at home.  He had a temperature of 101.  EMS was called and as he was trying to get up from his couch he fell on his right side hitting his posterior ribs on a table.  In the ED his blood pressure was low in the 90s and he received 2 L of of LR bolus.  Heart rate was 110s, WBC 17K, creatinine 1.2.  UA was unremarkable, lipase normal, lactic acid 1.5.  CT abdomen and pelvis with contrast showed nothing acute, CTA of the chest was negative also.  He was given IV antibiotics Rocephin  and azithromycin  and admitted.  The next day the creatinine bumped to 1.6 and then 2.8 yesterday and 3.5 today.  We are asked to see for renal failure.   Pt seen in his room.  He states he is now urinating whereas he was not urinating the first 1 or 2 days he was here.  He says its much better and he has voided at least 800 cc since this morning.  Denies any history of significant kidney failure.  No voiding issues.  He is having some wheezing issues and coughing a bit, the nebulizers help him get it up.  He is on nasal cannula oxygen.  Multiple chest x-rays since admission have been negative for fluid overload.  Since admission he also had wide-complex tachycardia in context of nausea and vomiting and abdominal pain.  Troponins were negative and cardiology was consulted.   The pacer was interrogated and no alarming events were noted recently.  Echo showed stable EF of 50 to 55%.   ROS - denies CP, no joint pain, no HA, no blurry vision, no rash, no diarrhea, no nausea/ vomiting   Past Medical History  Past Medical History:  Diagnosis Date   Aortic stenosis/ bicuspid valve--s/p AVR    Ascending aortic aneurysm (HCC)    Atrial fibrillation/flutter    Biventricular implantable cardiac defibrillator- BSX    Cholelithiases    Diabetes mellitus without complication (HCC)    DJD (degenerative joint disease)    Hypertension    LBBB (left bundle branch block)    Non-ischemic cardiomyopathy (HCC)    OSA (obstructive sleep apnea)    Past Surgical History  Past Surgical History:  Procedure Laterality Date   AORTIC VALVE REPLACEMENT     APPENDECTOMY     CARDIOVERSION N/A 05/18/2021   Procedure: CARDIOVERSION;  Surgeon: Raford Riggs, MD;  Location[facility] ENDOSCOPY;  Service: Cardiovascular;  Laterality: N/A;   CERVICAL DISCECTOMY  5/01   C7   CHOLECYSTECTOMY     EP IMPLANTABLE DEVICE N/A 11/20/2014   Procedure:  ICD Generator Changeout;  Surgeon: Elspeth JAYSON Sage, MD;  Location: [facility] INVASIVE CV LAB;  Service: Cardiovascular;  Laterality: N/A;   MEDIAN STERNOTOMY  TONSILLECTOMY     Family History  Family History  Problem Relation Age of Onset   Emphysema Father    Leukemia Mother    Hypertension Maternal Grandmother    Stroke Maternal Grandmother    Congestive Heart Failure Sister    Diabetes Brother    Alzheimer's disease Brother    Social History  reports that he has quit smoking. He has never used smokeless tobacco. He reports that he does not drink alcohol and does not use drugs. Allergies No Known Allergies Home medications Prior to Admission medications   Medication Sig Start Date End Date Taking? Authorizing Provider  allopurinol  (ZYLOPRIM ) 100 MG tablet Take 1 tablet (100 mg total) by mouth daily. 08/04/23  Yes   aspirin  81 MG chewable  tablet Chew 81 mg by mouth daily.   Yes [provider]  carvedilol  (COREG ) 12.5 MG tablet Take 1 tablet (12.5 mg total) by mouth 2 (two) times daily with a meal. 01/25/23  Yes Crenshaw, Redell RAMAN, MD  dapagliflozin  propanediol (FARXIGA ) 10 MG TABS tablet Take 1 tablet (10 mg total) by mouth daily. 09/08/23  Yes   furosemide  (LASIX ) 40 MG tablet Take 1 tablet (40 mg total) by mouth daily. Take an extra tablet as needed for swelling or shortness of breath. 07/14/23  Yes Pietro Redell RAMAN, MD  ibuprofen  (ADVIL ) 200 MG tablet Take 200 mg by mouth every 6 (six) hours as needed.   Yes [provider]  lisinopril  (ZESTRIL ) 40 MG tablet Take 1 tablet (40 mg total) by mouth daily. 11/26/22  Yes   Semaglutide , 2 MG/DOSE, (OZEMPIC , 2 MG/DOSE,) 8 MG/3ML SOPN Inject 2 mg into the skin once a week. 04/07/23  Yes   spironolactone  (ALDACTONE ) 25 MG tablet Take 0.5 tablets (12.5 mg total) by mouth daily. 10/17/23 10/16/24 Yes Pietro Redell RAMAN, MD  albuterol  (VENTOLIN  HFA) 108 (90 Base) MCG/ACT inhaler Inhale 2 puffs into the lungs every 6 (six) hours as needed for wheezing or shortness of breath. 12/16/22   Pokhrel, Laxman, MD  glucose blood (ONETOUCH VERIO) test strip Use to test blood sugar once a day. 10/21/22        Vitals:   11/19/23 0033 11/19/23 0449 11/19/23 1429 11/19/23 1801  BP: 110/60 (!) 107/59 (!) 141/83   Pulse: 91 91    Resp: (!) 26 (!) 24 (!) 22   Temp: 98 F (36.7 C) 97.9 F (36.6 C) 98 F (36.7 C)   TempSrc: Oral Oral Oral   SpO2: 97% 98% 99% 94%  Weight:      Height:       Exam Gen alert, no distress, [place] O2 No rash, cyanosis or gangrene Sclera anicteric, throat clear  +jvd Chest bilateral basilar crackles, bilat mild exp wheezing RRR no MRG Abd soft ntnd no mass or ascites +bs GU condom cath draining large amts of darkish yellow urine MS no joint effusions or deformity Ext trace LE edema, no other edema Neuro is alert, Ox 3 , nf   Home bp meds: Coreg  12.5  bid Farxiga  Lasix  40mg  every day Advil  prn  Lisinopril  40mg  every day Aldactone  12.5 qd    Date   Creat  eGFR (ml/min) 2008- 2015  0.80- 1.20 2016   1.45 2016   1.19 2017   1.62 >> 1.25 2018   1.06 2019- 2021  1.14- 1.03 Jul 2020  1.21 Jan-feb 2023  1.20- 1.32 Sept 2024  1.38 >> 1.09 Apr 2023  1.15- 1.51 Feb  1.30  55 ml/min Mar   1.38 07/25/23  1.31  54 ml/min 8/13   1.20  > 60  8/14   1.61 8/15   2.45 11/19/23  3.53  BP: bp's were 90s-100s mostly the 1st 48 hrs, then 100s- 110s yesterday and 140/80 today I/O: in's = 5 L and 1.2 L UOP = + 3.8 L  Wts: only wt on 8/14 is 81.6kg IP meds of interest:  SP IV amio IV rocephin  8/14 IV zithromax  8/14 LR 2 L bolus 8/13-14 LR 100 /hr 8/15, now dc'd  IV contrast 8/14 midnite 100 mL   8/16 UA: neg LE/ nit, neg protein 8/15 UNa: 11,  UCr: 230  CT abd/ pelv +100 cc contrast on 8/14: Urinary Tract: Kidneys demonstrate a normal enhancement pattern....no obstructive changes are noted. The ureters are within normal limits. The bladder is decompressed.  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 Creat on admission was 1.20, then up to 1.61, then 2.4 yest and 3.5 today. Had 100cc IV contrast w/ CT on 8/14. FeNa on 8/15 was low.  Pt was hypotensive 1st 48 hrs in the 90s mostly, then BP's improved the last 48 hrs > 110s. Was on acei at home (held here). AKI suspected due to home ACEi+ hypotension+ vol depletion + contrast injury. Urine output is turning around today. Will continue IVFs and supportive care. No indication for RRT at this time. Will follow.  Volume: no evidence yet of vol overload. Will get updated bed wt. Multiple CXRs here have been neg for edema. Cont to hold home diuretics. UOP is picking up today. Will lower IVFs slightly to 85 cc/hr, follow vol  closely.  Wide-complex tachycardia: seen by cardiology/ EP team.  Abd pain /N/ V/ SIRS: suspected GI viral illness.  Acute hypoxia: CTA neg for PE, no infiltrates or effusion.  Hypotension: suspect hypovolemia as BP's sig improved after IVFs (and rx of WCT possibly) S/P CRT-D NICM: EF 20% w/ now improved to 55% H/o AVF H/o MAVE: for PAF        Myer Fret  MD [facility] 11/19/2023, 7:30 PM  Recent Labs  Lab 11/18/23 0440 11/19/23 0439  CREATININE 2.83* 3.53*  K 3.7 3.9   Inpatient medications:  enoxaparin  (LOVENOX ) injection  30 mg Subcutaneous Daily   insulin  aspart  0-5 Units Subcutaneous QHS   insulin  aspart  0-9 Units Subcutaneous TID WC    lactated ringers  100 mL/hr at 11/19/23 1750   acetaminophen  **OR** acetaminophen , levalbuterol , metoprolol  tartrate, ondansetron  (ZOFRAN ) IV, oxyCODONE 

## 2023-11-19 NOTE — Plan of Care (Signed)
  Problem: Education: Goal: Ability to describe self-care measures that may prevent or decrease complications (Diabetes Survival Skills Education) will improve Outcome: Progressing   Problem: Skin Integrity: Goal: Risk for impaired skin integrity will decrease Outcome: Progressing   Problem: Activity: Goal: Risk for activity intolerance will decrease Outcome: Progressing   Problem: Nutrition: Goal: Adequate nutrition will be maintained Outcome: Progressing

## 2023-11-20 DIAGNOSIS — R1084 Generalized abdominal pain: Secondary | ICD-10-CM | POA: Diagnosis not present

## 2023-11-20 DIAGNOSIS — N179 Acute kidney failure, unspecified: Secondary | ICD-10-CM | POA: Diagnosis not present

## 2023-11-20 DIAGNOSIS — I4891 Unspecified atrial fibrillation: Secondary | ICD-10-CM | POA: Diagnosis not present

## 2023-11-20 DIAGNOSIS — R6510 Systemic inflammatory response syndrome (SIRS) of non-infectious origin without acute organ dysfunction: Secondary | ICD-10-CM | POA: Diagnosis not present

## 2023-11-20 LAB — BASIC METABOLIC PANEL WITH GFR
Anion gap: 12 (ref 5–15)
BUN: 66 mg/dL — ABNORMAL HIGH (ref 8–23)
CO2: 23 mmol/L (ref 22–32)
Calcium: 8.9 mg/dL (ref 8.9–10.3)
Chloride: 102 mmol/L (ref 98–111)
Creatinine, Ser: 2.43 mg/dL — ABNORMAL HIGH (ref 0.61–1.24)
GFR, Estimated: 26 mL/min — ABNORMAL LOW (ref >60)
Glucose, Bld: 182 mg/dL — ABNORMAL HIGH (ref 70–99)
Potassium: 4.4 mmol/L (ref 3.5–5.1)
Sodium: 137 mmol/L (ref 135–145)

## 2023-11-20 LAB — GLUCOSE, CAPILLARY
Glucose-Capillary: 168 mg/dL — ABNORMAL HIGH (ref 70–99)
Glucose-Capillary: 212 mg/dL — ABNORMAL HIGH (ref 70–99)
Glucose-Capillary: 160 mg/dL — ABNORMAL HIGH (ref 70–99)
Glucose-Capillary: 192 mg/dL — ABNORMAL HIGH (ref 70–99)

## 2023-11-20 LAB — CBC WITH DIFFERENTIAL/PLATELET
Abs Immature Granulocytes: 0.05 K/uL (ref 0.00–0.07)
Basophils Absolute: 0 K/uL (ref 0.0–0.1)
Basophils Relative: 0 %
Eosinophils Absolute: 0.2 K/uL (ref 0.0–0.5)
Eosinophils Relative: 2 %
HCT: 41.7 % (ref 39.0–52.0)
Hemoglobin: 13.4 g/dL (ref 13.0–17.0)
Immature Granulocytes: 1 %
Lymphocytes Relative: 2 %
Lymphs Abs: 0.2 K/uL — ABNORMAL LOW (ref 0.7–4.0)
MCH: 28.8 pg (ref 26.0–34.0)
MCHC: 32.1 g/dL (ref 30.0–36.0)
MCV: 89.5 fL (ref 80.0–100.0)
Monocytes Absolute: 1.2 K/uL — ABNORMAL HIGH (ref 0.1–1.0)
Monocytes Relative: 12 %
Neutro Abs: 8.1 K/uL — ABNORMAL HIGH (ref 1.7–7.7)
Neutrophils Relative %: 83 %
Platelets: 142 K/uL — ABNORMAL LOW (ref 150–400)
RBC: 4.66 MIL/uL (ref 4.22–5.81)
RDW: 14.4 % (ref 11.5–15.5)
WBC: 9.9 K/uL (ref 4.0–10.5)
nRBC: 0 % (ref 0.0–0.2)

## 2023-11-20 LAB — MAGNESIUM: Magnesium: 2.6 mg/dL — ABNORMAL HIGH (ref 1.7–2.4)

## 2023-11-20 MED ORDER — CARVEDILOL 12.5 MG PO TABS
12.5000 mg | ORAL_TABLET | Freq: Two times a day (BID) | ORAL | Status: DC
  Administered 2023-11-20 – 2023-11-21 (×3): 12.5 mg via ORAL
  Filled 2023-11-20 (×3): qty 1

## 2023-11-20 MED ORDER — DOCUSATE SODIUM 100 MG PO CAPS
100.0000 mg | ORAL_CAPSULE | Freq: Once | ORAL | Status: AC
  Administered 2023-11-20: 100 mg via ORAL
  Filled 2023-11-20: qty 1

## 2023-11-20 MED ORDER — LACTATED RINGERS IV SOLN: INTRAVENOUS | Status: DC

## 2023-11-20 NOTE — Progress Notes (Signed)
 Progress Note    [name]   FMW:[id number]  DOB: [birth year]  DOA: 11/16/2023     2 PCP: Vernadine Charlie ORN, MD  Initial CC: abd pain, vomiting  Hospital Course: [name] is a 83 y.o. 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   Patient states he had a [place] salad at [employer] for dinner on 8/12.  The following day 8/13 he started having generalized abdominal pain and was passing a lot of gas.  He had a few episodes of vomiting at home which started after he tried taking Pepto-Bismol.  Reports temperature of 101 F at home and took Tylenol  prior to coming into the ED.  Patient states she just before EMS arrived, as he was trying to get up from his couch, his legs felt weak and he fell on his right side hitting his posterior ribs against the corner of a table and since then this area feels sore.  Denies head injury or loss of consciousness.  He reports history of chronic lumbar vertebral fracture for which he is seen by orthopedics and was told that no surgery was needed.  He is not having any pain in his back.    He was admitted for further workup.  CT abdomen/pelvis was negative for acute abnormalities.  Underlying old L2 transverse process fracture noted. CT angio chest negative for PE and showed acute mildly displaced posterior 11th right rib fracture.  Symptoms of nausea improved and he tolerated a diet well.   Assessment and Plan   Elevated troponin Wide-complex tachycardia - given recurrent episodes of tachycardia and shaking in context of diaporesis, N/V, and abd pain on admission, so concern for cardiac etiology - initial trop on admission was flat (20>>21); repeated 8/15 and  elevated further 128 (no CP complaints); EKG is V-paced  - asked for cardiology input given symptoms  - evaluated by EP; pacer also interrogated and notes <4 mths battery life and no alarming events recently - trop peaked at 128, then downtrended; echo shows preserved EF, 50 to 55%, mild LVH, indeterminate diastolic parameters - Cardiology following  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 Baseline creat ~ 1.3, eGFR~ 50 - 55 - initial creat 1.2 on admission, but 1.61 the morning after and still uptrending - differential includes prolonged pre-renal +/- ATN due to this vs hypoperfusion (prolonged hypotension vs cardiogenic shock) - echo negative for reduced function - FeNa technically 0.1%, UA did not show any casts - If accurate, 24-hour urine output 470 cc - CT A/P from 8/14 shows no obstructive changes and ureters within normal limits, decompressed bladder - bladder scan negative for retention on 8/15 - creat still worse 8/16; will ask for nephrology input; agreed with continuing on fluids -Output has also continued to increase, likely consistent with ATN diuretic phase -Renal function also showing improvement today, 2.43 improved from 3.53 yesterday -Continue monitoring output -Repeat BMP in a.m.; if further improvement will consider discharge home as early as tomorrow if also okay with cardiology and nephrology  Generalized abdominal pain, nausea, vomiting SIRS - Suspecting possible viral illness precipitating nausea/vomiting.  He had no complaints of diarrhea -If does develop diarrhea, will send for testing -UA negative for signs of infection - CBC improved without abx -  continue monitoring off abx  Acute hypoxia - CT angio chest negative for PE; no obvious infiltrates or effusion - Known new right 11th rib fracture and does have some mild bibasilar atelectasis already seen on CT angio chest -Possible progression of atelectasis in setting of rib fracture - continue O2 for  now and wean as able - serial CXR still negative for edema, effusions, infiltrate; mostly hypoinflation and atelectasis  - wean O2 as able  - Needs walk test and ongoing encouragement to use incentive spirometer - Goal is to liberate from oxygen prior to discharge home  History of nonischemic cardiomyopathy Last echo done in September 2023 showing improvement of EF to 50 to 55%. - Initially resumed Coreg  after admission but blood pressure dropped and discontinued once again -Coreg  again resumed on 11/19/2023 and BP stable -Also holding Lasix , lisinopril , spironolactone  for now; resuming as able -Repeat echo reassuring with preserved EF  Paroxysmal A-fib/flutter Not on anticoagulation.   - Coreg  resumed 11/19/2023 per cardiology, blood pressure holding stable   Mild thrombocytopenia No signs of bleeding.  Monitor CBC.   Acute mildly displaced posterior 11th right rib fracture - Secondary to mechanical fall at home prior to ED arrival.  Patient is reporting mild pain/soreness in this area.  - Continue pain management   L2 transverse process fracture on the right of uncertain chronicity Seen on CT.  Per conversation with the patient, it seems this might be a chronic finding and he is seen by orthopedics on an outpatient basis.  He denies any back pain at this time.   Type 2 diabetes - Last A1c 7.4 in September 2024, repeat ordered.  Placed on sensitive sliding scale insulin  ACHS.   Gout - hold allopurinol  for now in setting of worsening renal function   Hypertension - most meds on hold for now; was hypotensive initially   Interval History:  Still feeling well this morning.  Reportedly late yesterday afternoon respiratory was worried about the patient looking bad.  CXR only showed small left effusion and some bibasilar atelectasis.  This morning he is awake, alert, wearing oxygen as usual but feels okay.  Family present bedside.  Still wanting to go home.  Discussed potentially  tomorrow if renal function shows further improvement. Goal is also to keep weaning oxygen.  Old records reviewed in assessment of this patient  Antimicrobials:   DVT prophylaxis:  enoxaparin  (LOVENOX ) injection 30 mg Start: 11/18/23 1000   Code Status:   Code Status: Full Code  Mobility Assessment (Last 72 Hours)     Mobility Assessment     Row Name 11/20/23 0945 11/19/23 2100 11/19/23 0720 11/18/23 2030 11/18/23 1606   Does the patient have exclusion criteria? No - Perform mobility assessment No - Perform mobility assessment No - Perform mobility assessment No - Perform mobility assessment --   What is the highest level of mobility based on the mobility assessment? Level 4 (Ambulates with assistance) - Balance while stepping forward/back - Complete Level 3 (Stands with assistance) - Balance while standing  and cannot Brandon in place Level 3 (Stands with assistance) - Balance while standing  and cannot Brandon in place -- Level 3 (Stands with assistance) - Balance while standing  and cannot Brandon in place   Is the above level different from baseline mobility prior to current illness? Yes - Recommend PT order -- -- -- --    Row Name 11/18/23 0815 11/17/23 2000 11/17/23 1513       Does the patient  have exclusion criteria? No - Perform mobility assessment No - Perform mobility assessment No - Perform mobility assessment     What is the highest level of mobility based on the mobility assessment? Level 4 (Ambulates with assistance) - Balance while stepping forward/back - Complete Level 4 (Ambulates with assistance) - Balance while stepping forward/back - Complete Level 4 (Ambulates with assistance) - Balance while stepping forward/back - Complete        Barriers to discharge: None Disposition Plan: Home HH orders placed: N/A Status is: Inpatient  Objective: Blood pressure 136/63, pulse (!) 109, temperature 98 F (36.7 C), temperature source Oral, resp. rate 18, height 5' 10 (1.778 m),  weight 94.2 kg, SpO2 97%.  Examination:  Physical Exam Constitutional:      General: He is not in acute distress.    Appearance: Normal appearance.  HENT:     Head: Normocephalic and atraumatic.     Mouth/Throat:     Mouth: Mucous membranes are moist.  Eyes:     Extraocular Movements: Extraocular movements intact.  Cardiovascular:     Rate and Rhythm: Normal rate. Rhythm irregular.  Pulmonary:     Effort: Pulmonary effort is normal. No respiratory distress.     Breath sounds: Normal breath sounds. No wheezing.     Comments: Mild abrasion along lower right back; mild scattered coarse breath sounds bilaterally worse in the bases Abdominal:     General: Bowel sounds are normal. There is no distension.     Palpations: Abdomen is soft.     Tenderness: There is no abdominal tenderness.  Musculoskeletal:        General: Normal range of motion.     Cervical back: Normal range of motion and neck supple.     Right lower leg: Edema (1+) present.     Left lower leg: Edema (1+) present.  Skin:    General: Skin is warm and dry.  Neurological:     General: No focal deficit present.     Mental Status: He is alert.  Psychiatric:        Mood and Affect: Mood normal.        Behavior: Behavior normal.      Consultants:  Cardiology Nephrology  Procedures:    Data Reviewed: Results for orders placed or performed during the hospital encounter of 11/16/23 (from the past 24 hours)  Glucose, capillary     Status: Abnormal   Collection Time: 11/19/23  5:05 PM  Result Value Ref Range   Glucose-Capillary 193 (H) 70 - 99 mg/dL  Glucose, capillary     Status: Abnormal   Collection Time: 11/19/23  9:08 PM  Result Value Ref Range   Glucose-Capillary 200 (H) 70 - 99 mg/dL  Basic metabolic panel with GFR     Status: Abnormal   Collection Time: 11/20/23  5:34 AM  Result Value Ref Range   Sodium 137 135 - 145 mmol/L   Potassium 4.4 3.5 - 5.1 mmol/L   Chloride 102 98 - 111 mmol/L   CO2 23 22 -  32 mmol/L   Glucose, Bld 182 (H) 70 - 99 mg/dL   BUN 66 (H) 8 - 23 mg/dL   Creatinine, Ser 7.56 (H) 0.61 - 1.24 mg/dL   Calcium  8.9 8.9 - 10.3 mg/dL   GFR, Estimated 26 (L) >60 mL/min   Anion gap 12 5 - 15  CBC with Differential/Platelet     Status: Abnormal   Collection Time: 11/20/23  5:34 AM  Result Value  Ref Range   WBC 9.9 4.0 - 10.5 K/uL   RBC 4.66 4.22 - 5.81 MIL/uL   Hemoglobin 13.4 13.0 - 17.0 g/dL   HCT 58.2 60.9 - 47.9 %   MCV 89.5 80.0 - 100.0 fL   MCH 28.8 26.0 - 34.0 pg   MCHC 32.1 30.0 - 36.0 g/dL   RDW 85.5 88.4 - 84.4 %   Platelets 142 (L) 150 - 400 K/uL   nRBC 0.0 0.0 - 0.2 %   Neutrophils Relative % 83 %   Neutro Abs 8.1 (H) 1.7 - 7.7 K/uL   Lymphocytes Relative 2 %   Lymphs Abs 0.2 (L) 0.7 - 4.0 K/uL   Monocytes Relative 12 %   Monocytes Absolute 1.2 (H) 0.1 - 1.0 K/uL   Eosinophils Relative 2 %   Eosinophils Absolute 0.2 0.0 - 0.5 K/uL   Basophils Relative 0 %   Basophils Absolute 0.0 0.0 - 0.1 K/uL   Immature Granulocytes 1 %   Abs Immature Granulocytes 0.05 0.00 - 0.07 K/uL  Magnesium      Status: Abnormal   Collection Time: 11/20/23  5:34 AM  Result Value Ref Range   Magnesium  2.6 (H) 1.7 - 2.4 mg/dL  Glucose, capillary     Status: Abnormal   Collection Time: 11/20/23  7:23 AM  Result Value Ref Range   Glucose-Capillary 168 (H) 70 - 99 mg/dL  Glucose, capillary     Status: Abnormal   Collection Time: 11/20/23 11:36 AM  Result Value Ref Range   Glucose-Capillary 212 (H) 70 - 99 mg/dL    I have reviewed pertinent nursing notes, vitals, labs, and images as necessary. I have ordered labwork to follow up on as indicated.  I have reviewed the last notes from staff over past 24 hours. I have discussed patient's care plan and test results with nursing staff, CM/SW, and other staff as appropriate.  Time spent: Greater than 50% of the 55 minute visit was spent in counseling/coordination of care for the patient as laid out in the A&P.   LOS: 2 days    Alm Apo, MD [facility] 11/20/2023, 1:14 PM

## 2023-11-20 NOTE — Progress Notes (Signed)
 MEWS Score is yellow from heart rate between 106-114. Yellow MEWS vital sign protocol in place. Cardiology consulted.  PRN medication available when heart rate is greater than 115. Continue to monitor.

## 2023-11-20 NOTE — Plan of Care (Signed)
  Problem: Clinical Measurements: Goal: Ability to maintain clinical measurements within normal limits will improve Outcome: Progressing   Problem: Activity: Goal: Risk for activity intolerance will decrease Outcome: Progressing   Problem: Pain Managment: Goal: General experience of comfort will improve and/or be controlled Outcome: Progressing   Problem: Safety: Goal: Ability to remain free from injury will improve Outcome: Progressing

## 2023-11-20 NOTE — Progress Notes (Addendum)
 Progress Note  Patient Name: [name] Date of Encounter: 11/20/2023  Primary Cardiologist: Redell Shallow, MD   Subjective   Feels great. Eager to go home.  Inpatient Medications    Scheduled Meds:  enoxaparin  (LOVENOX ) injection  30 mg Subcutaneous Daily   insulin  aspart  0-5 Units Subcutaneous QHS   insulin  aspart  0-9 Units Subcutaneous TID WC   Continuous Infusions:  lactated ringers  85 mL/hr at 11/20/23 0347   PRN Meds: acetaminophen  **OR** acetaminophen , levalbuterol , metoprolol  tartrate, ondansetron  (ZOFRAN ) IV, oxyCODONE    Vital Signs    Vitals:   11/19/23 2000 11/20/23 0343 11/20/23 0453 11/20/23 0500  BP: (!) 141/81 (!) 124/99 (!) 145/82   Pulse:  (!) 114 (!) 115   Resp: 18 18 18    Temp: 98.3 F (36.8 C) 97.6 F (36.4 C) 98 F (36.7 C)   TempSrc: Oral Oral Oral   SpO2: 97% 97% 99%   Weight:   94.2 kg 94.2 kg  Height:        Intake/Output Summary (Last 24 hours) at 11/20/2023 0727 Last data filed at 11/20/2023 0500 Gross per 24 hour  Intake 820 ml  Output 1975 ml  Net -1155 ml   Filed Weights   11/17/23 0108 11/20/23 0453 11/20/23 0500  Weight: 81.6 kg 94.2 kg 94.2 kg    Telemetry    Atrial fibrillation with RVR, rare V-pacing - Personally Reviewed Cardiac monitoring strips reviewed these show a rapid, wide-complex rhythm   ECG    V paced rhythm- Personally Reviewed  Physical Exam   GEN: No acute distress.   Neck: No JVD Cardiac: RRR, no murmurs, rubs, or gallops.  Respiratory: Clear to auscultation bilaterally. GI: Soft, nontender, non-distended  MS: No edema; No deformity. Neuro:  Nonfocal  Psych: Normal affect   Labs    Chemistry Recent Labs  Lab 11/16/23 2325 11/17/23 1350 11/18/23 0048 11/18/23 0440 11/19/23 0439 11/20/23 0534  NA 135 133* 132* 132* 132* 137  K 3.5 3.8 3.8 3.7 3.9 4.4  CL 102 101 98 99 98 102  CO2 23 20* 20* 19* 22 23  GLUCOSE 225* 193* 189* 178* 152* 182*  BUN 37* 43* 56* 58* 71* 66*   CREATININE 1.20 1.61* 2.45* 2.83* 3.53* 2.43*  CALCIUM  8.9 8.4* 8.5* 8.3* 8.1* 8.9  PROT 6.1* 6.3* 6.1*  --   --   --   ALBUMIN 3.5 3.2* 3.1*  --   --   --   AST 26 26 44*  --   --   --   ALT 39 38 51*  --   --   --   ALKPHOS 78 80 99  --   --   --   BILITOT 3.0* 2.5* 2.2*  --   --   --   GFRNONAA >60 42* 25* 21* 16* 26*  ANIONGAP 10 12 14 14 12 12      Hematology Recent Labs  Lab 11/18/23 0440 11/19/23 0439 11/20/23 0534  WBC 12.5* 10.1 9.9  RBC 4.21* 4.02* 4.66  HGB 12.2* 11.8* 13.4  HCT 38.0* 36.3* 41.7  MCV 90.3 90.3 89.5  MCH 29.0 29.4 28.8  MCHC 32.1 32.5 32.1  RDW 14.8 14.6 14.4  PLT 108* 103* 142*    Cardiac EnzymesNo results for input(s): TROPONINI in the last 168 hours. No results for input(s): TROPIPOC in the last 168 hours.   BNPNo results for input(s): BNP, PROBNP in the last 168 hours.   DDimer No results for input(s):  DDIMER in the last 168 hours.   Summary of Pertinent studies    TTE: 11/18/2023 -LVEF 50 to 55%.  Mild concentric LVH.   Patient Profile     83 y.o. male with a hx of bicuspid aortic valve s/p AVR and MAZE for PAF, replacement of ascending aortic aneurysm, s/p CRT-D 2011, NICM with LVEF 20% on LHC prior to sternotomy with improved LVEF to 55% who was admitted for abdominal pain and emesis.  Cardiology was initially consulted yesterday for the evaluation of elevated troponin at the request of Dr. Patsy.  Hospital course complicated by wide-complex tachycardia    Assessment & Plan    Wide-complex tachycardia -- Longstanding persistent atrial flutter I reviewed the device interrogation - there have been no VT events Pt has atrial flutter with underlying bundle branch block Device appears to be functioning normally.  VT therapies are programmed at a rate of 180 bpm and higher Low atrial amplitude on EKG, fibrillation can't be excluded; device EGMs are consistent with flutter Patient has declined anticoagulation per prior  notes Rates are usually well-controlled Will resume carvedilol  for rate control  CHFrecoveredEF Appears reasonably well compensated today Resume GDMT as tolerated Pt is not receiving adequate CRT Will resume carvedilol   Environmental manager CRT-D Device functioning normally Battery longevity less than 3 months  Elevated troponin Troponin is mildly elevated, commensurate with acute illness; low suspicion for ACS  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 1.2 on admission -> 3.5 GDMT held  For questions or updates, please contact [facility] Please consult [email or web address] for contact info under Cardiology/STEMI.      Signed, Eulas FORBES Furbish, MD 11/20/2023, 7:27 AM

## 2023-11-20 NOTE — Progress Notes (Signed)
 [facility] Progress Note  Subjective:  Creat down to 2.4 today (3.5 yest) UOP  1.7 yest Feeling a lot better  Vitals:   11/20/23 0453 11/20/23 0500 11/20/23 0945 11/20/23 1337  BP: (!) 145/82  136/63 122/71  Pulse: (!) 115  (!) 109 77  Resp: 18   16  Temp: 98 F (36.7 C)   98.7 F (37.1 C)  TempSrc: Oral   Oral  SpO2: 99%  97% 92%  Weight: 94.2 kg 94.2 kg    Height:        Exam: Gen alert, no distress, [place] O2 Chest wheezing better, faint crackles at bases RRR no MRG Abd soft ntnd no mass or ascites +bs GU condom cath w/ dark yellow urine Ext trace LE edema Neuro is alert, Ox 3 , nf     Home bp meds: Coreg  12.5 bid Farxiga  Lasix  40mg  every day Advil  prn  Lisinopril  40mg  every day Aldactone  12.5 qd   Date                             Creat               eGFR (ml/min) 2008- 2015                  0.80- 1.20 2016- 2018                  1.06- 1.62 2019- 2021                  1.14- 1.31         2022                  1.21 2023                  1.20- 1.32 Sept 2024                    1.38 >> 1.09 Apr 2023                     1.15- 1.51 Feb                              1.30                 55 ml/min Mar                              1.38 07/25/23                        1.31                 54 ml/min 8/13                             1.20                 > 60  8/14                             1.61 8/15  2.45 11/19/23                        3.53  IP meds of interest:  LR 2 L bolus 8/13-14 IV contrast 8/14 100 mL   8/16 UA: neg LE/ nit, neg protein 8/15 UNa: 11,  UCr: 230  CT abd/ pelv 8/14: Urinary Tract: Kidneys demonstrate a normal enhancement pattern....no obstructive changes are noted. The ureters are within normal limits. The bladder is decompressed.  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 Creat on  admission was 1.20, then rose up to 3.5 on day on consult. Had 100cc IV contrast w/ CT on 8/14. FeNa on 8/15 was low.  Pt was hypotensive 1st 48 hrs here w/ sbp's in the 90s, and then BP's improved the last 48hrs. Was on acei at home (held here). AKI suspected due to home ACEi+ hypotension+ vol depletion + contrast injury. IVFs were continued + supportive care. Creat down to mid 2s today, recovering function. Will cont IVFs, lower to 65 cc/hr, f/u labs in am. If UOP good and creat stable or improving, should be able to dc tomorrow.   Volume: no evidence yet of vol overload. Multiple CXRs here have been neg for edema. Cont to hold home diuretics. Lower IVFs as above.  Wide-complex tachycardia: seen by cardiology/ EP team.  Abd pain /N/ V/ SIRS: suspected GI viral illness.  Acute hypoxia: CTA neg for PE, no infiltrates or effusion.  Hypotension: suspect hypovolemia as BP's sig improved after IVFs (and rx of WCT possibly) S/P CRT-D NICM: EF 20% w/ now improved to 55% H/o AVF H/o MAVE: for PAF       Rob Paden Kuras MD  [facility] 11/20/2023, 2:24 PM  Recent Labs  Lab 11/17/23 1350 11/18/23 0048 11/18/23 0440 11/19/23 0439 11/20/23 0534  HGB 13.2 12.9*   < > 11.8* 13.4  ALBUMIN 3.2* 3.1*  --   --   --   CALCIUM  8.4* 8.5*   < > 8.1* 8.9  CREATININE 1.61* 2.45*   < > 3.53* 2.43*  K 3.8 3.8   < > 3.9 4.4   < > = values in this interval not displayed.   No results for input(s): IRON, TIBC, FERRITIN in the last 168 hours. Inpatient medications:  carvedilol   12.5 mg Oral BID   enoxaparin  (LOVENOX ) injection  30 mg Subcutaneous Daily   insulin  aspart  0-5 Units Subcutaneous QHS   insulin  aspart  0-9 Units Subcutaneous TID WC    lactated ringers  85 mL/hr at 11/20/23 0347   acetaminophen  **OR** acetaminophen , levalbuterol , metoprolol  tartrate, ondansetron  (ZOFRAN ) IV, oxyCODONE 

## 2023-11-21 ENCOUNTER — Other Ambulatory Visit (HOSPITAL_COMMUNITY): Payer: Self-pay

## 2023-11-21 ENCOUNTER — Other Ambulatory Visit: Payer: Self-pay

## 2023-11-21 DIAGNOSIS — I4891 Unspecified atrial fibrillation: Secondary | ICD-10-CM | POA: Diagnosis not present

## 2023-11-21 DIAGNOSIS — I480 Paroxysmal atrial fibrillation: Secondary | ICD-10-CM

## 2023-11-21 DIAGNOSIS — R6510 Systemic inflammatory response syndrome (SIRS) of non-infectious origin without acute organ dysfunction: Secondary | ICD-10-CM | POA: Diagnosis not present

## 2023-11-21 DIAGNOSIS — N179 Acute kidney failure, unspecified: Secondary | ICD-10-CM | POA: Diagnosis not present

## 2023-11-21 DIAGNOSIS — R0902 Hypoxemia: Secondary | ICD-10-CM

## 2023-11-21 DIAGNOSIS — R7989 Other specified abnormal findings of blood chemistry: Secondary | ICD-10-CM

## 2023-11-21 DIAGNOSIS — R1084 Generalized abdominal pain: Secondary | ICD-10-CM | POA: Diagnosis not present

## 2023-11-21 LAB — CBC WITH DIFFERENTIAL/PLATELET
Abs Immature Granulocytes: 0.04 K/uL (ref 0.00–0.07)
Basophils Absolute: 0 K/uL (ref 0.0–0.1)
Basophils Relative: 0 %
Eosinophils Absolute: 0.2 K/uL (ref 0.0–0.5)
Eosinophils Relative: 4 %
HCT: 31.6 % — ABNORMAL LOW (ref 39.0–52.0)
Hemoglobin: 10.1 g/dL — ABNORMAL LOW (ref 13.0–17.0)
Immature Granulocytes: 1 %
Lymphocytes Relative: 6 %
Lymphs Abs: 0.3 K/uL — ABNORMAL LOW (ref 0.7–4.0)
MCH: 29.1 pg (ref 26.0–34.0)
MCHC: 32 g/dL (ref 30.0–36.0)
MCV: 91.1 fL (ref 80.0–100.0)
Monocytes Absolute: 0.7 K/uL (ref 0.1–1.0)
Monocytes Relative: 12 %
Neutro Abs: 4.1 K/uL (ref 1.7–7.7)
Neutrophils Relative %: 77 %
Platelets: 115 K/uL — ABNORMAL LOW (ref 150–400)
RBC: 3.47 MIL/uL — ABNORMAL LOW (ref 4.22–5.81)
RDW: 14.2 % (ref 11.5–15.5)
WBC: 5.3 K/uL (ref 4.0–10.5)
nRBC: 0 % (ref 0.0–0.2)

## 2023-11-21 LAB — BASIC METABOLIC PANEL WITH GFR
Anion gap: 10 (ref 5–15)
BUN: 58 mg/dL — ABNORMAL HIGH (ref 8–23)
CO2: 23 mmol/L (ref 22–32)
Calcium: 7.7 mg/dL — ABNORMAL LOW (ref 8.9–10.3)
Chloride: 103 mmol/L (ref 98–111)
Creatinine, Ser: 1.65 mg/dL — ABNORMAL HIGH (ref 0.61–1.24)
GFR, Estimated: 41 mL/min — ABNORMAL LOW (ref >60)
Glucose, Bld: 133 mg/dL — ABNORMAL HIGH (ref 70–99)
Potassium: 4.3 mmol/L (ref 3.5–5.1)
Sodium: 136 mmol/L (ref 135–145)

## 2023-11-21 LAB — GLUCOSE, CAPILLARY
Glucose-Capillary: 159 mg/dL — ABNORMAL HIGH (ref 70–99)
Glucose-Capillary: 223 mg/dL — ABNORMAL HIGH (ref 70–99)
Glucose-Capillary: 190 mg/dL — ABNORMAL HIGH (ref 70–99)

## 2023-11-21 LAB — MAGNESIUM: Magnesium: 2.1 mg/dL (ref 1.7–2.4)

## 2023-11-21 MED ORDER — SPIRONOLACTONE 25 MG PO TABS: 12.5000 mg | ORAL_TABLET | Freq: Every day | ORAL | Status: DC

## 2023-11-21 MED ORDER — FUROSEMIDE 40 MG PO TABS: 80.0000 mg | ORAL_TABLET | Freq: Every day | ORAL | Status: AC

## 2023-11-21 MED ORDER — ALBUTEROL SULFATE HFA 108 (90 BASE) MCG/ACT IN AERS
2.0000 | INHALATION_SPRAY | Freq: Four times a day (QID) | RESPIRATORY_TRACT | 1 refills | Status: AC | PRN
  Filled 2023-11-21: qty 18, 25d supply, fill #0

## 2023-11-21 MED ORDER — LISINOPRIL 40 MG PO TABS: 40.0000 mg | ORAL_TABLET | Freq: Every day | ORAL | Status: DC

## 2023-11-21 NOTE — Progress Notes (Signed)
 Mobility Specialist - Progress Note   11/21/23 1425  Mobility  Activity Ambulated with assistance  Level of Assistance Minimal assist, patient does 75% or more  Assistive Device Front wheel walker  Distance Ambulated (ft) 20 ft  Range of Motion/Exercises Active  Activity Response Tolerated well  Mobility Referral Yes  Mobility visit 1 Mobility  Mobility Specialist Start Time (ACUTE ONLY) 1415  Mobility Specialist Stop Time (ACUTE ONLY) 1425  Mobility Specialist Time Calculation (min) (ACUTE ONLY) 10 min   Pt requested assistance to bathroom. Min-A for STS from chair. At EOS returned to chair with all needs met. Daughter in room.  Erminio Leos,  Mobility Specialist Can be reached via Secure Chat

## 2023-11-21 NOTE — Discharge Summary (Signed)
 Physician Discharge Summary   Ayad [facility] FMW:[id number] DOB: [birth year] DOA: 11/16/2023  PCP: [name], Richard W, MD  Admit date: 11/16/2023 Discharge date: 11/21/2023  Admitted From: Home Disposition:  Home Discharging physician: Alm Apo, MD Barriers to discharge: none  Recommendations at discharge: Repeat BMP Follow up with EP  Home Health: PT Equipment/Devices: none  Discharge Condition: stable CODE STATUS: Full  Diet recommendation:  Diet Orders (From admission, onward)     Start     Ordered   11/21/23 0000  Diet general        11/21/23 1256   11/17/23 1317  Diet regular Fluid consistency: Thin  Diet effective now       Question:  Fluid consistency:  Answer:  Thin   11/17/23 1317   11/17/23 0000  Diet - low sodium heart healthy        11/17/23 1042            Hospital Course: [name] is a 83 y.o. 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   Patient states he had a [place] salad at [employer] for dinner on 8/12.  The following day 8/13 he started having generalized abdominal pain and was passing a lot of gas.  He had a few episodes of vomiting at home which started after he tried taking Pepto-Bismol.  Reports temperature of 101 F at home and took Tylenol  prior to coming into the ED.  Patient states she just before EMS arrived, as he was trying to get up from his couch, his legs felt weak and he fell on his right side hitting his posterior ribs against the corner of a table and since then this area feels sore.  Denies head injury or loss of consciousness.  He reports history of chronic lumbar vertebral fracture for which he is seen by orthopedics and was told that no  surgery was needed.  He is not having any pain in his back.    He was admitted for further workup.  CT abdomen/pelvis was negative for acute abnormalities.  Underlying old L2 transverse process fracture noted. CT angio chest negative for PE and showed acute mildly displaced posterior 11th right rib fracture.  Symptoms of nausea improved and he tolerated a diet well.   Assessment and Plan   Elevated troponin Wide-complex tachycardia - given recurrent episodes of tachycardia and shaking in context of diaporesis, N/V, and abd pain on admission, so concern for cardiac etiology - initial trop on admission was flat (20>>21); repeated 8/15 and elevated further 128 (no CP complaints); EKG is V-paced  - asked for cardiology input given symptoms  - evaluated by EP; pacer also interrogated and notes <4 mths battery life and no alarming events recently - trop peaked at 128, then downtrended; echo shows preserved EF, 50 to 55%, mild LVH, indeterminate diastolic parameters - Cardiology following; Home meds resumed - Outpatient follow-up with EP  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 Baseline creat ~ 1.3, eGFR~ 50 - 55 - initial creat 1.2 on admission, but 1.61 the morning after and still uptrending - differential includes prolonged pre-renal +/- ATN due to this vs hypoperfusion (prolonged hypotension vs cardiogenic shock) - echo negative for reduced function - FeNa technically 0.1%, UA did not show any  casts - If accurate, 24-hour urine output 470 cc - CT A/P from 8/14 shows no obstructive changes and ureters within normal limits, decompressed bladder - bladder scan negative for retention on 8/15 - creat still worse 8/16; will ask for nephrology input; agreed with continuing on fluids -Output has also continued to increase, likely consistent with ATN diuretic phase -Renal function also showing improvement on 8/17, 2.43 improved from 3.53 yesterday -Renal function continued to improve prior  to discharge with increasing urine output; clinical picture consistent with ATN -Creatinine 1.65 at discharge -Lisinopril , spironolactone , and Lasix  held at discharge for several more days prior to resuming - repeat BMP at follow up  Generalized abdominal pain, nausea, vomiting-resolved SIRS-resolved - Suspecting possible viral illness precipitating nausea/vomiting.  He had no complaints of diarrhea -If does develop diarrhea, will send for testing -UA negative for signs of infection - CBC improved without abx -WBC normalized and stayed within normal range for multiple days without antibiotics  Acute hypoxia-resolved - CT angio chest negative for PE; no obvious infiltrates or effusion - Known new right 11th rib fracture and does have some mild bibasilar atelectasis already seen on CT angio chest -Possible progression of atelectasis in setting of rib fracture -Able to be weaned off of oxygen prior to discharge.  Also performed walk test with lowest saturation 89% with quick recovery  History of nonischemic cardiomyopathy Last echo done in September 2023 showing improvement of EF to 50 to 55%. - Initially resumed Coreg  after admission but blood pressure dropped and discontinued once again -Coreg  again resumed on 11/19/2023 and BP stable -Also holding Lasix , lisinopril , spironolactone  for now; resuming as able -Repeat echo reassuring with preserved EF  Paroxysmal A-fib/flutter Not on anticoagulation.   - Coreg  resumed 11/19/2023 per cardiology, blood pressure holding stable   Mild thrombocytopenia No signs of bleeding.  Monitor CBC.   Acute mildly displaced posterior 11th right rib fracture - Secondary to mechanical fall at home prior to ED arrival.  Patient is reporting mild pain/soreness in this area.  - Continue pain management   L2 transverse process fracture on the right of uncertain chronicity Seen on CT.  Per conversation with the patient, it seems this might be a chronic  finding and he is seen by orthopedics on an outpatient basis.  He denies any back pain at this time.   Type 2 diabetes - Last A1c 7.4 in September 2024 - Continue home regimen   Gout - hold allopurinol  for now in setting of worsening renal function   Hypertension - Meds being resumed slowly at discharge especially in setting of ATN  The patient's acute and chronic medical conditions were treated accordingly. On day of discharge, patient was felt deemed stable for discharge. Patient/family member advised to call PCP or come back to ER if needed.   Principal Diagnosis: Generalized abdominal pain  Discharge Diagnoses: Active Hospital Problems   Diagnosis Date Noted   SIRS (systemic inflammatory response syndrome) (HCC) 11/17/2023    Priority: 1.   Atrial fibrillation with RVR (HCC) 11/18/2023    Priority: 2.   Acute renal failure superimposed on stage 3a chronic kidney disease (HCC) 11/18/2023    Priority: 2.   Elevated troponin 11/18/2023    Priority: 3.   Rib fracture 11/17/2023    Priority: 3.   Hypoxia 11/18/2023    Priority: 4.   Thrombocytopenia (HCC) 11/17/2023   Gout 11/17/2023   Atrial fibrillation (HCC) 06/09/2010    Resolved Hospital Problems   Diagnosis Date  Noted Date Resolved   Generalized abdominal pain 11/17/2023 11/17/2023    Priority: 1.   Nausea and vomiting 11/17/2023 11/17/2023    Priority: 1.     Discharge Instructions     Diet - low sodium heart healthy   Complete by: As directed    Diet general   Complete by: As directed    Increase activity slowly   Complete by: As directed    Increase activity slowly   Complete by: As directed       Allergies as of 11/21/2023   No Known Allergies      Medication List     STOP taking these medications    mupirocin  ointment 2 % Commonly known as: BACTROBAN        TAKE these medications    albuterol  108 (90 Base) MCG/ACT inhaler Commonly known as: VENTOLIN  HFA Inhale 2 puffs into the lungs  every 6 (six) hours as needed for wheezing or shortness of breath.   allopurinol  100 MG tablet Commonly known as: ZYLOPRIM  Take 1 tablet (100 mg total) by mouth daily.   aspirin  81 MG chewable tablet Chew 81 mg by mouth daily.   carvedilol  12.5 MG tablet Commonly known as: COREG  Take 1 tablet (12.5 mg total) by mouth 2 (two) times daily with a meal.   Farxiga  10 MG Tabs tablet Generic drug: dapagliflozin  propanediol Take 1 tablet (10 mg total) by mouth daily.   furosemide  40 MG tablet Commonly known as: LASIX  Take 2 tablets (80 mg total) by mouth daily. Start taking on: November 25, 2023 What changed: These instructions start on November 25, 2023. If you are unsure what to do until then, ask your doctor or other care provider.   ibuprofen  200 MG tablet Commonly known as: ADVIL  Take 200 mg by mouth every 6 (six) hours as needed.   lisinopril  40 MG tablet Commonly known as: ZESTRIL  Take 1 tablet (40 mg total) by mouth daily. Start taking on: December 05, 2023 What changed: These instructions start on December 05, 2023. If you are unsure what to do until then, ask your doctor or other care provider.   OneTouch Verio test strip Generic drug: glucose blood Use to test blood sugar once a day.   Ozempic  (2 MG/DOSE) 8 MG/3ML Sopn Generic drug: Semaglutide  (2 MG/DOSE) Inject 2 mg into the skin once a week.   spironolactone  25 MG tablet Commonly known as: ALDACTONE  Take 0.5 tablets (12.5 mg total) by mouth daily. Start taking on: November 25, 2023 What changed: These instructions start on November 25, 2023. If you are unsure what to do until then, ask your doctor or other care provider.        Follow-up Information     [facility] Follow up.   Why: Leopoldo will provide PT in the home after discharge. Contact information: [address] [place] [address] [phone]                No Known  Allergies  Consultations: Cardiology Nephrology  Procedures:   Discharge Exam: BP (!) 115/58 (BP Location: Right Arm)   Pulse 77   Temp 98.5 F (36.9 C) (Oral)   Resp 16   Ht 5' 10 (1.778 m)   Wt 92.4 kg   SpO2 93%   BMI 29.23 kg/m  Physical Exam Constitutional:      General: He is not in acute distress.    Appearance: Normal appearance.  HENT:  Head: Normocephalic and atraumatic.     Mouth/Throat:     Mouth: Mucous membranes are moist.  Eyes:     Extraocular Movements: Extraocular movements intact.  Cardiovascular:     Rate and Rhythm: Normal rate. Rhythm irregular.  Pulmonary:     Effort: Pulmonary effort is normal. No respiratory distress.     Breath sounds: Normal breath sounds. No wheezing.     Comments: Mild abrasion along lower right back; mild scattered coarse breath sounds bilaterally worse in the bases Abdominal:     General: Bowel sounds are normal. There is no distension.     Palpations: Abdomen is soft.     Tenderness: There is no abdominal tenderness.  Musculoskeletal:        General: Normal range of motion.     Cervical back: Normal range of motion and neck supple.     Right lower leg: Edema (1+) present.     Left lower leg: Edema (1+) present.  Skin:    General: Skin is warm and dry.  Neurological:     General: No focal deficit present.     Mental Status: He is alert.  Psychiatric:        Mood and Affect: Mood normal.        Behavior: Behavior normal.      The results of significant diagnostics from this hospitalization (including imaging, microbiology, ancillary and laboratory) are listed below for reference.   Microbiology: Recent Results (from the past 240 hours)  Blood culture (routine x 2)     Status: None (Preliminary result)   Collection Time: 11/17/23  2:39 AM   Specimen: BLOOD  Result Value Ref Range Status   Specimen Description   Final    BLOOD BLOOD RIGHT ARM Performed at [facility], [address] [place], [place] [address]    Special Requests   Final    BOTTLES DRAWN AEROBIC AND ANAEROBIC Blood Culture adequate volume Performed at [facility], [address], [place], [place] [address]    Culture   Final    NO GROWTH 4 DAYS Performed at [facility], [address], [place], [place] [address]    Report Status PENDING  Incomplete  Blood culture (routine x 2)     Status: None (Preliminary result)   Collection Time: 11/17/23  2:39 AM   Specimen: BLOOD  Result Value Ref Range Status   Specimen Description   Final    BLOOD BLOOD LEFT ARM Performed at [facility], [address], [place], [place] [address]    Special Requests   Final    BOTTLES DRAWN AEROBIC AND ANAEROBIC Blood Culture adequate volume Performed at [facility], [address] [place], [place] [address]    Culture   Final    NO GROWTH 4 DAYS Performed at [facility], [address] [place], [place] [address]    Report Status PENDING  Incomplete  Resp panel by RT-PCR (RSV, Flu A&B, Covid) Anterior Nasal Swab     Status: None   Collection Time: 11/17/23  6:53 AM   Specimen: Anterior Nasal Swab  Result Value Ref Range Status   SARS Coronavirus 2 by RT PCR NEGATIVE NEGATIVE Final    Comment: (NOTE) SARS-CoV-2 target nucleic acids are NOT DETECTED.  The SARS-CoV-2 RNA is generally detectable in upper respiratory specimens during the acute phase of infection. The lowest concentration of SARS-CoV-2 viral copies this assay can detect  is 138 copies/mL. A negative result does not preclude SARS-Cov-2 infection and should not be used as the sole basis for treatment or other patient management decisions. A negative result may occur with  improper specimen collection/handling, submission of specimen other than nasopharyngeal swab, presence of viral mutation(s) within the areas targeted by this assay, and inadequate number of viral copies(<138  copies/mL). A negative result must be combined with clinical observations, patient history, and epidemiological information. The expected result is Negative.  Fact Sheet for Patients:  BloggerCourse.com  Fact Sheet for Healthcare Providers:  SeriousBroker.it  This test is no t yet approved or cleared by the [place]  FDA and  has been authorized for detection and/or diagnosis of SARS-CoV-2 by FDA under an Emergency Use Authorization (EUA). This EUA will remain  in effect (meaning this test can be used) for the duration of the COVID-19 declaration under Section 564(b)(1) of the Act, 21 U.S.C.section 360bbb-3(b)(1), unless the authorization is terminated  or revoked sooner.       Influenza A by PCR NEGATIVE NEGATIVE Final   Influenza B by PCR NEGATIVE NEGATIVE Final    Comment: (NOTE) The Xpert Xpress SARS-CoV-2/FLU/RSV plus assay is intended as an aid in the diagnosis of influenza from Nasopharyngeal swab specimens and should not be used as a sole basis for treatment. Nasal washings and aspirates are unacceptable for Xpert Xpress SARS-CoV-2/FLU/RSV testing.  Fact Sheet for Patients: BloggerCourse.com  Fact Sheet for Healthcare Providers: SeriousBroker.it  This test is not yet approved or cleared by the [place]  FDA and has been authorized for detection and/or diagnosis of SARS-CoV-2 by FDA under an Emergency Use Authorization (EUA). This EUA will remain in effect (meaning this test can be used) for the duration of the COVID-19 declaration under Section 564(b)(1) of the Act, 21 U.S.C. section 360bbb-3(b)(1), unless the authorization is terminated or revoked.     Resp Syncytial Virus by PCR NEGATIVE NEGATIVE Final    Comment: (NOTE) Fact Sheet for Patients: BloggerCourse.com  Fact Sheet for Healthcare  Providers: SeriousBroker.it  This test is not yet approved or cleared by the [place]  FDA and has been authorized for detection and/or diagnosis of SARS-CoV-2 by FDA under an Emergency Use Authorization (EUA). This EUA will remain in effect (meaning this test can be used) for the duration of the COVID-19 declaration under Section 564(b)(1) of the Act, 21 U.S.C. section 360bbb-3(b)(1), unless the authorization is terminated or revoked.  Performed at [facility], [address] [place], [place] [address]      Labs: BNP (last 3 results) No results for input(s): BNP in the last 8760 hours. Basic Metabolic Panel: Recent Labs  Lab 11/18/23 0048 11/18/23 0440 11/19/23 0439 11/20/23 0534 11/21/23 0452  NA 132* 132* 132* 137 136  K 3.8 3.7 3.9 4.4 4.3  CL 98 99 98 102 103  CO2 20* 19* 22 23 23   GLUCOSE 189* 178* 152* 182* 133*  BUN 56* 58* 71* 66* 58*  CREATININE 2.45* 2.83* 3.53* 2.43* 1.65*  CALCIUM  8.5* 8.3* 8.1* 8.9 7.7*  MG  --  2.1 2.3 2.6* 2.1   Liver Function Tests: Recent Labs  Lab 11/16/23 2325 11/17/23 1350 11/18/23 0048  AST 26 26 44*  ALT 39 38 51*  ALKPHOS 78 80 99  BILITOT 3.0* 2.5* 2.2*  PROT 6.1* 6.3* 6.1*  ALBUMIN 3.5 3.2* 3.1*   Recent Labs  Lab 11/16/23 2325  LIPASE 32   No results for input(s): AMMONIA in the last 168  hours. CBC: Recent Labs  Lab 11/17/23 1350 11/18/23 0048 11/18/23 0440 11/19/23 0439 11/20/23 0534 11/21/23 0452  WBC 11.5* 8.3 12.5* 10.1 9.9 5.3  NEUTROABS 10.7*  --  10.9* 8.4* 8.1* 4.1  HGB 13.2 12.9* 12.2* 11.8* 13.4 10.1*  HCT 39.8 40.0 38.0* 36.3* 41.7 31.6*  MCV 89.2 89.3 90.3 90.3 89.5 91.1  PLT 129* 117* 108* 103* 142* 115*   Cardiac Enzymes: No results for input(s): CKTOTAL, CKMB, CKMBINDEX, TROPONINI in the last 168 hours. BNP: Invalid input(s): POCBNP CBG: Recent Labs  Lab 11/20/23 1136 11/20/23 1615 11/20/23 2135 11/21/23 0729  11/21/23 1136  GLUCAP 212* 160* 192* 159* 223*   D-Dimer No results for input(s): DDIMER in the last 72 hours. Hgb A1c No results for input(s): HGBA1C in the last 72 hours. Lipid Profile No results for input(s): CHOL, HDL, LDLCALC, TRIG, CHOLHDL, LDLDIRECT in the last 72 hours. Thyroid function studies No results for input(s): TSH, T4TOTAL, T3FREE, THYROIDAB in the last 72 hours.  Invalid input(s): FREET3 Anemia work up No results for input(s): VITAMINB12, FOLATE, FERRITIN, TIBC, IRON, RETICCTPCT in the last 72 hours. Urinalysis    Component Value Date/Time   COLORURINE YELLOW 11/19/2023 1024   APPEARANCEUR HAZY (A) 11/19/2023 1024   LABSPEC 1.013 11/19/2023 1024   PHURINE 5.0 11/19/2023 1024   GLUCOSEU 50 (A) 11/19/2023 1024   HGBUR NEGATIVE 11/19/2023 1024   BILIRUBINUR NEGATIVE 11/19/2023 1024   KETONESUR NEGATIVE 11/19/2023 1024   PROTEINUR NEGATIVE 11/19/2023 1024   UROBILINOGEN 0.2 08/07/2007 0414   NITRITE NEGATIVE 11/19/2023 1024   LEUKOCYTESUR NEGATIVE 11/19/2023 1024   Sepsis Labs Recent Labs  Lab 11/18/23 0440 11/19/23 0439 11/20/23 0534 11/21/23 0452  WBC 12.5* 10.1 9.9 5.3   Microbiology Recent Results (from the past 240 hours)  Blood culture (routine x 2)     Status: None (Preliminary result)   Collection Time: 11/17/23  2:39 AM   Specimen: BLOOD  Result Value Ref Range Status   Specimen Description   Final    BLOOD BLOOD RIGHT ARM Performed at [facility], [address] [place] [address] [place], [place] [address]    Special Requests   Final    BOTTLES DRAWN AEROBIC AND ANAEROBIC Blood Culture adequate volume Performed at [facility], [address] [place], [place] [address]    Culture   Final    NO GROWTH 4 DAYS Performed at [facility], [address], [place], [place] [address]    Report Status PENDING  Incomplete  Blood culture (routine x 2)     Status: None  (Preliminary result)   Collection Time: 11/17/23  2:39 AM   Specimen: BLOOD  Result Value Ref Range Status   Specimen Description   Final    BLOOD BLOOD LEFT ARM Performed at [facility], [address], [place], [place] [address]    Special Requests   Final    BOTTLES DRAWN AEROBIC AND ANAEROBIC Blood Culture adequate volume Performed at [facility], [address], [place], [place] [address]    Culture   Final    NO GROWTH 4 DAYS Performed at [facility], [address], [place], [place] [address]    Report Status PENDING  Incomplete  Resp panel by RT-PCR (RSV, Flu A&B, Covid) Anterior Nasal Swab     Status: None   Collection Time: 11/17/23  6:53 AM   Specimen: Anterior Nasal Swab  Result Value Ref Range Status  SARS Coronavirus 2 by RT PCR NEGATIVE NEGATIVE Final    Comment: (NOTE) SARS-CoV-2 target nucleic acids are NOT DETECTED.  The SARS-CoV-2 RNA is generally detectable in upper respiratory specimens during the acute phase of infection. The lowest concentration of SARS-CoV-2 viral copies this assay can detect is 138 copies/mL. A negative result does not preclude SARS-Cov-2 infection and should not be used as the sole basis for treatment or other patient management decisions. A negative result may occur with  improper specimen collection/handling, submission of specimen other than nasopharyngeal swab, presence of viral mutation(s) within the areas targeted by this assay, and inadequate number of viral copies(<138 copies/mL). A negative result must be combined with clinical observations, patient history, and epidemiological information. The expected result is Negative.  Fact Sheet for Patients:  BloggerCourse.com  Fact Sheet for Healthcare Providers:  SeriousBroker.it  This test is no t yet approved or cleared by the [place]  FDA and  has been authorized for  detection and/or diagnosis of SARS-CoV-2 by FDA under an Emergency Use Authorization (EUA). This EUA will remain  in effect (meaning this test can be used) for the duration of the COVID-19 declaration under Section 564(b)(1) of the Act, 21 U.S.C.section 360bbb-3(b)(1), unless the authorization is terminated  or revoked sooner.       Influenza A by PCR NEGATIVE NEGATIVE Final   Influenza B by PCR NEGATIVE NEGATIVE Final    Comment: (NOTE) The Xpert Xpress SARS-CoV-2/FLU/RSV plus assay is intended as an aid in the diagnosis of influenza from Nasopharyngeal swab specimens and should not be used as a sole basis for treatment. Nasal washings and aspirates are unacceptable for Xpert Xpress SARS-CoV-2/FLU/RSV testing.  Fact Sheet for Patients: BloggerCourse.com  Fact Sheet for Healthcare Providers: SeriousBroker.it  This test is not yet approved or cleared by the [place]  FDA and has been authorized for detection and/or diagnosis of SARS-CoV-2 by FDA under an Emergency Use Authorization (EUA). This EUA will remain in effect (meaning this test can be used) for the duration of the COVID-19 declaration under Section 564(b)(1) of the Act, 21 U.S.C. section 360bbb-3(b)(1), unless the authorization is terminated or revoked.     Resp Syncytial Virus by PCR NEGATIVE NEGATIVE Final    Comment: (NOTE) Fact Sheet for Patients: BloggerCourse.com  Fact Sheet for Healthcare Providers: SeriousBroker.it  This test is not yet approved or cleared by the [place]  FDA and has been authorized for detection and/or diagnosis of SARS-CoV-2 by FDA under an Emergency Use Authorization (EUA). This EUA will remain in effect (meaning this test can be used) for the duration of the COVID-19 declaration under Section 564(b)(1) of the Act, 21 U.S.C. section 360bbb-3(b)(1), unless the authorization is  terminated or revoked.  Performed at [facility], [address], [place], [place] [address]     Procedures/Studies: DG CHEST PORT 1 VIEW Result Date: 11/19/2023 CLINICAL DATA:  Short of breath, hypoxia EXAM: PORTABLE CHEST 1 VIEW COMPARISON:  11/18/2023 FINDINGS: Five LEFT-sided pacer overlies normal cardiac silhouette. Midline sternotomy. There is a small LEFT effusion. Mild basilar atelectasis. No focal consolidation. No pneumothorax. No acute osseous abnormality. IMPRESSION: Small LEFT effusion and basilar atelectasis. Electronically Signed   By: Jackquline Boxer M.D.   On: 11/19/2023 19:00   ECHOCARDIOGRAM COMPLETE Result Date: 11/18/2023    ECHOCARDIOGRAM REPORT   Patient Name:   [name] [facility] Date of Exam: 11/18/2023 Medical Rec #:  [id number]      Height:       70.0  in Accession #:    [id number]     Weight:       180.0 lb Date of Birth:  07-23-1940      BSA:          1.996 m Patient Age:    83 years       BP:           88/52 mmHg Patient Gender: M              HR:           60 bpm. Exam Location:  Inpatient Procedure: 2D Echo, Intracardiac Opacification Agent, Cardiac Doppler and Color            Doppler (Both Spectral and Color Flow Doppler were utilized during            procedure). 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 25mm Edwards bioprostetic valve present in to                 AO position., Arrythmias:Atrial Fibrillation and LBBB; Risk                 Factors:Hypertension, Diabetes and Sleep Apnea.  Sonographer:    Koleen Popper RDCS Referring Phys: [id number] ANGELA NICOLE DUKE IMPRESSIONS  1. Poor acoustic windows Definity  used. . Left ventricular ejection fraction, by estimation, is 50 to 55%. The left ventricle has low normal function. The left ventricular internal cavity size was moderately dilated. There is mild concentric left ventricular  hypertrophy. Left ventricular diastolic parameters are indeterminate.  2. Right ventricular systolic function is normal. The right ventricular size is normal.  3. Left atrial size was mildly dilated.  4. The mitral valve is normal in structure. Mild mitral valve regurgitation.  5. The aortic valve is tricuspid. Aortic valve regurgitation is not visualized. Aortic valve sclerosis/calcification is present, without any evidence of aortic stenosis.  6. Aortic dilatation noted. There is mild dilatation of the ascending aorta, measuring 40 mm. FINDINGS  Left Ventricle: Poor acoustic windows Definity  used. Left ventricular ejection fraction, by estimation, is 50 to 55%. The left ventricle has low normal function. Definity  contrast agent was given IV to delineate the left ventricular endocardial borders.  The left ventricular internal cavity size was moderately dilated. There is mild concentric left ventricular hypertrophy. Left ventricular diastolic parameters are indeterminate. Right Ventricle: The right ventricular size is normal. Right vetricular wall thickness was not assessed. Right ventricular systolic function is normal. Left Atrium: Left atrial size was mildly dilated. Right Atrium: Right atrial size was normal in size. Pericardium: There is no evidence of pericardial effusion. Mitral Valve: The mitral valve is normal in structure. Mild mitral valve regurgitation. Tricuspid Valve: The tricuspid valve is normal in structure. Tricuspid valve regurgitation is mild. Aortic Valve: The aortic valve is tricuspid. Aortic valve regurgitation is not visualized. Aortic valve sclerosis/calcification is present, without any evidence of aortic stenosis. Aortic valve mean gradient measures 12.0 mmHg. Aortic valve peak gradient  measures 22.1 mmHg. Aortic valve area, by VTI measures 0.88 cm. Pulmonic Valve: The pulmonic valve was not well visualized. Pulmonic valve regurgitation is mild. Aorta: The aortic root is normal in size  and structure and aortic dilatation noted. There is mild dilatation of the ascending aorta, measuring 40 mm. IAS/Shunts: No atrial  level shunt detected by color flow Doppler. Additional Comments: A device lead is visualized.  LEFT VENTRICLE PLAX 2D LVIDd:         5.80 cm LVIDs:         4.60 cm LV PW:         1.10 cm LV IVS:        1.30 cm LVOT diam:     1.90 cm LV SV:         41 LV SV Index:   21 LVOT Area:     2.84 cm  RIGHT VENTRICLE RV Basal diam:  3.70 cm RV S prime:     6.32 cm/s TAPSE (M-mode): 1.5 cm LEFT ATRIUM             Index        RIGHT ATRIUM           Index LA diam:        4.60 cm 2.30 cm/m   RA Area:     17.80 cm LA Vol (A2C):   73.7 ml 36.93 ml/m  RA Volume:   41.90 ml  20.99 ml/m LA Vol (A4C):   62.4 ml 31.26 ml/m LA Biplane Vol: 70.0 ml 35.07 ml/m  AORTIC VALVE AV Area (Vmax):    0.89 cm AV Area (Vmean):   0.83 cm AV Area (VTI):     0.88 cm AV Vmax:           235.25 cm/s AV Vmean:          162.500 cm/s AV VTI:            0.473 m AV Peak Grad:      22.1 mmHg AV Mean Grad:      12.0 mmHg LVOT Vmax:         74.10 cm/s LVOT Vmean:        47.600 cm/s LVOT VTI:          0.146 m LVOT/AV VTI ratio: 0.31  AORTA Ao Root diam: 3.40 cm Ao Asc diam:  4.00 cm MITRAL VALVE                TRICUSPID VALVE MV Area (PHT): 3.77 cm     TR Peak grad:   18.5 mmHg MV Decel Time: 201 msec     TR Vmax:        215.00 cm/s MR Peak grad: 55.5 mmHg MR Vmax:      372.50 cm/s   SHUNTS MV E velocity: 111.00 cm/s  Systemic VTI:  0.15 m MV A velocity: 37.30 cm/s   Systemic Diam: 1.90 cm MV E/A ratio:  2.98 Vina Gull MD Electronically signed by Vina Gull MD Signature Date/Time: 11/18/2023/4:32:23 PM    Final    DG Chest Port 1 View Result Date: 11/18/2023 CLINICAL DATA:  Aspiration. EXAM: PORTABLE CHEST 1 VIEW COMPARISON:  November 17, 2023. FINDINGS: Stable cardiomegaly. Status post aortic valve repair. Left-sided defibrillator is unchanged. Hypoinflation of the lungs is noted with minimal bibasilar subsegmental  atelectasis. Bony thorax is unremarkable. IMPRESSION: Hypoinflation of the lungs with minimal bibasilar subsegmental atelectasis. Electronically Signed   By: Lynwood Landy Raddle M.D.   On: 11/18/2023 08:16   DG CHEST PORT 1 VIEW Result Date: 11/17/2023 CLINICAL DATA:  Shortness of breath. EXAM: PORTABLE CHEST 1 VIEW COMPARISON:  Same day. FINDINGS: Stable cardiomediastinal silhouette. Left-sided defibrillator is unchanged. Sternotomy wires are noted. Both lungs are clear. The visualized skeletal structures are unremarkable. IMPRESSION: No active disease. Electronically  Signed   By: Lynwood Landy Raddle M.D.   On: 11/17/2023 13:36   CT ABDOMEN PELVIS W CONTRAST Result Date: 11/17/2023 CLINICAL DATA:  Acute abdominal pain EXAM: CT ABDOMEN AND PELVIS WITH CONTRAST TECHNIQUE: Multidetector CT imaging of the abdomen and pelvis was performed using the standard protocol following bolus administration of intravenous contrast. RADIATION DOSE REDUCTION: This exam was performed according to the departmental dose-optimization program which includes automated exposure control, adjustment of the mA and/or kV according to patient size and/or use of iterative reconstruction technique. CONTRAST:  OMNIPAQUE  IOHEXOL  350 MG/ML SOLN COMPARISON:  Abdominal film from earlier in the same day. FINDINGS: Lower chest: Lung bases are free of acute infiltrate or sizable effusion. Hepatobiliary: Gallbladder has been surgically removed. Liver appears within normal limits. Pancreas: Unremarkable. No pancreatic ductal dilatation or surrounding inflammatory changes. Spleen: Normal in size without focal abnormality. Adrenals/Urinary Tract: Adrenal glands are within normal limits. Kidneys demonstrate a normal enhancement pattern bilaterally. No renal calculi or obstructive changes are noted. Delayed images demonstrate no acute abnormality. The ureters are within normal limits. The bladder is decompressed. Stomach/Bowel: No obstructive or  inflammatory changes of the colon are seen. The appendix is not visualized consistent with prior surgical history. The small bowel and stomach are unremarkable. Vascular/Lymphatic: Aortic atherosclerosis. No enlarged abdominal or pelvic lymph nodes. Reproductive: Prostate is unremarkable. Other: No abdominal wall hernia or abnormality. No abdominopelvic ascites. Musculoskeletal: Fracture of the L2 transverse process on the right is noted of uncertain chronicity. Degenerative changes of the lumbar spine are noted. IMPRESSION: Fracture of the L2 transverse process on the right of uncertain chronicity. No other focal abnormality is noted. Electronically Signed   By: Oneil Devonshire M.D.   On: 11/17/2023 01:09   CT Angio Chest PE W and/or Wo Contrast Result Date: 11/17/2023 CLINICAL DATA:  Nausea and vomiting. EXAM: CT ANGIOGRAPHY CHEST WITH CONTRAST TECHNIQUE: Multidetector CT imaging of the chest was performed using the standard protocol during bolus administration of intravenous contrast. Multiplanar CT image reconstructions and MIPs were obtained to evaluate the vascular anatomy. RADIATION DOSE REDUCTION: This exam was performed according to the departmental dose-optimization program which includes automated exposure control, adjustment of the mA and/or kV according to patient size and/or use of iterative reconstruction technique. CONTRAST:  OMNIPAQUE  IOHEXOL  350 MG/ML SOLN COMPARISON:  None Available. FINDINGS: Cardiovascular: A dual lead AICD is in place. There is marked severity calcification of the thoracic aorta. An artificial aortic valve is seen. Satisfactory opacification of the pulmonary arteries to the segmental level. No evidence of pulmonary embolism. Normal heart size with marked severity coronary artery calcification. No pericardial effusion. Mediastinum/Nodes: No enlarged mediastinal, hilar, or axillary lymph nodes. Thyroid gland, trachea, and esophagus demonstrate no significant findings.  Lungs/Pleura: There is a 4 mm posterolateral right upper lobe calcified lung nodule (axial CT image 57, CT series 4). 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 Mild lingular and mild bibasilar atelectasis is seen. No pleural effusion or pneumothorax is identified. Upper Abdomen: Multiple surgical clips are seen within the gallbladder fossa. A mild amount of central pneumobilia is seen. This is decreased in severity when compared to the prior study. Musculoskeletal: Multiple sternal wires are present. Postoperative changes are seen within the visualized portion of the lower cervical spine. An acute, mildly displaced posterior eleventh right rib fracture is seen (axial CT images 136 through 139, CT series 4). Review of the MIP images confirms the above findings. IMPRESSION: 1. No evidence  of pulmonary embolism. 2. Acute, mildly displaced posterior eleventh right rib fracture. 3. Mild lingular and mild bibasilar atelectasis. 4. Evidence of prior cholecystectomy. 5. Aortic atherosclerosis. Electronically Signed   By: Suzen Dials M.D.   On: 11/17/2023 01:07   DG Chest Portable 1 View Result Date: 11/17/2023 CLINICAL DATA:  Nausea and vomiting and abdominal pain, initial encounter EXAM: PORTABLE CHEST 1 VIEW COMPARISON:  12/15/2022 FINDINGS: Stable cardiomegaly is noted. Defibrillator is again seen and stable. Postsurgical changes in the cervical spine are noted. No focal infiltrate or effusion is seen. No acute bony abnormality is noted. IMPRESSION: No acute abnormality noted. Electronically Signed   By: Oneil Devonshire M.D.   On: 11/17/2023 00:30   DG Abd Portable 1 View Result Date: 11/17/2023 CLINICAL DATA:  Nausea and vomiting and abdominal pain for 1 day, initial encounter EXAM: PORTABLE ABDOMEN - 1 VIEW COMPARISON:  12/13/2022 FINDINGS: Scattered large and small bowel gas is noted. No obstructive changes are seen. No free air is noted. No abnormal mass or  abnormal calcifications are noted. Degenerative change of the lumbar spine. IMPRESSION: No acute abnormality noted. Electronically Signed   By: Oneil Devonshire M.D.   On: 11/17/2023 00:29   CUP PACEART REMOTE DEVICE CHECK Result Date: 11/03/2023 ICD Scheduled remote reviewed. Normal device function.  Presenting rhythm: AF/LVP/RVP. Estimated battery longevity of < 3 months; routed to [employer] to initiate monthly battery checks. Longstanding persistent AF, V rates are controlled, declines OAC per Epic. Follow up as scheduled monthly. MC, CVRS    Time coordinating discharge: Over 30 minutes    Alm Apo, MD  [facility] 11/21/2023, 2:08 PM

## 2023-11-21 NOTE — Progress Notes (Signed)
 AVS reviewed w/ pt & wife who verbalized an understanding- pt dressed for d/c - discharge med in  a secure bag delivered to pt in room by this RN

## 2023-11-21 NOTE — Progress Notes (Signed)
 Progress Note  Patient Name: [name] Date of Encounter: 11/21/2023  Primary Cardiologist: Redell Shallow, MD   Subjective   Going home today  Inpatient Medications    Scheduled Meds:  carvedilol   12.5 mg Oral BID   enoxaparin  (LOVENOX ) injection  30 mg Subcutaneous Daily   insulin  aspart  0-5 Units Subcutaneous QHS   insulin  aspart  0-9 Units Subcutaneous TID WC   Continuous Infusions:  lactated ringers  65 mL/hr at 11/21/23 0727   PRN Meds: acetaminophen  **OR** acetaminophen , levalbuterol , metoprolol  tartrate, ondansetron  (ZOFRAN ) IV, oxyCODONE    Vital Signs    Vitals:   11/21/23 0431 11/21/23 0615 11/21/23 0626 11/21/23 0629  BP: (!) 123/59     Pulse: 74     Resp:      Temp: 97.6 F (36.4 C)     TempSrc: Oral     SpO2: 92% 92% (!) 88% 96%  Weight: 92.4 kg     Height:        Intake/Output Summary (Last 24 hours) at 11/21/2023 0927 Last data filed at 11/21/2023 0600 Gross per 24 hour  Intake 1420.29 ml  Output 1995 ml  Net -574.71 ml   Filed Weights   11/20/23 0453 11/20/23 0500 11/21/23 0431  Weight: 94.2 kg 94.2 kg 92.4 kg    Telemetry    Atrial fibrillation with RVR, rare V-pacing - Personally Reviewed Cardiac monitoring strips reviewed these show a rapid, wide-complex rhythm   ECG    V paced rhythm- Personally Reviewed  Physical Exam    Elderly male SEM through AVR Lungs clear AICD under left clavicle Plus one LE edema with venous stasis  Labs    Chemistry Recent Labs  Lab 11/16/23 2325 11/17/23 1350 11/18/23 0048 11/18/23 0440 11/19/23 0439 11/20/23 0534 11/21/23 0452  NA 135 133* 132*   < > 132* 137 136  K 3.5 3.8 3.8   < > 3.9 4.4 4.3  CL 102 101 98   < > 98 102 103  CO2 23 20* 20*   < > 22 23 23   GLUCOSE 225* 193* 189*   < > 152* 182* 133*  BUN 37* 43* 56*   < > 71* 66* 58*  CREATININE 1.20 1.61* 2.45*   < > 3.53* 2.43* 1.65*  CALCIUM  8.9 8.4* 8.5*   < > 8.1* 8.9 7.7*  PROT 6.1* 6.3* 6.1*  --   --   --   --    ALBUMIN 3.5 3.2* 3.1*  --   --   --   --   AST 26 26 44*  --   --   --   --   ALT 39 38 51*  --   --   --   --   ALKPHOS 78 80 99  --   --   --   --   BILITOT 3.0* 2.5* 2.2*  --   --   --   --   GFRNONAA >60 42* 25*   < > 16* 26* 41*  ANIONGAP 10 12 14    < > 12 12 10    < > = values in this interval not displayed.     Hematology Recent Labs  Lab 11/19/23 0439 11/20/23 0534 11/21/23 0452  WBC 10.1 9.9 5.3  RBC 4.02* 4.66 3.47*  HGB 11.8* 13.4 10.1*  HCT 36.3* 41.7 31.6*  MCV 90.3 89.5 91.1  MCH 29.4 28.8 29.1  MCHC 32.5 32.1 32.0  RDW 14.6 14.4 14.2  PLT 103* 142*  115*    Cardiac EnzymesNo results for input(s): TROPONINI in the last 168 hours. No results for input(s): TROPIPOC in the last 168 hours.   BNPNo results for input(s): BNP, PROBNP in the last 168 hours.   DDimer No results for input(s): DDIMER in the last 168 hours.   Summary of Pertinent studies    TTE: 11/18/2023 -LVEF 50 to 55%.  Mild concentric LVH.   Patient Profile     83 y.o. male with a hx of bicuspid aortic valve s/p AVR and MAZE for PAF, replacement of ascending aortic aneurysm, s/p CRT-D 2011, NICM with LVEF 20% on LHC prior to sternotomy with improved LVEF to 55% who was admitted for abdominal pain and emesis.  Cardiology was initially consulted yesterday for the evaluation of elevated troponin at the request of Dr. Patsy.  Hospital course complicated by wide-complex tachycardia    Assessment & Plan    Wide-complex tachycardia -- Longstanding persistent atrial flutter No VT events  Flutter with V pacing on monitor  Device appears to be functioning normally.  VT therapies are programmed at a rate of 180 bpm and higher Patient has declined anticoagulation per prior notes Rates are usually well-controlled Will resume carvedilol  for rate control  CHFrecoveredEF Appears reasonably well compensated today Resume GDMT as tolerated Pt is not receiving adequate CRT Coreg  12.5 mg bid for  rate control   Environmental manager CRT-D Device functioning normally Battery longevity less than 3 months  Elevated troponin Troponin is mildly elevated, commensurate with acute illness; low suspicion for ACS No chest pain   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 1.2 on admission -> 3.5 Improved 1.65    Will arrange outpatient f/u with Dr Pietro. Prior EP doctor Fernande will have him see Dr Nancey as AICD will need to be changed out soon.   For questions or updates, please contact [facility] Please consult [email or web address] for contact info under Cardiology/STEMI.      Signed, Maude Emmer, MD 11/21/2023, 9:27 AM

## 2023-11-21 NOTE — NC FL2 (Signed)
 [place]  MEDICAID FL2 LEVEL OF CARE FORM     IDENTIFICATION  Patient Name: [name] Birthdate: 1940/05/04 Sex: male Admission Date (Current Location): 11/16/2023  [facility] and [place] Number:  Producer, television/film/video and Address:  [facility],  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) 11/18/2023   Hypoxia 11/18/2023   SIRS (systemic inflammatory response syndrome) (HCC) 11/17/2023   Thrombocytopenia (HCC) 11/17/2023   Rib fracture 11/17/2023   Gout 11/17/2023   Pressure injury of left buttock, stage 1 12/20/2022   Sepsis due to Escherichia coli (HCC) 12/20/2022   Sepsis (HCC) 12/13/2022   PNA (pneumonia) 12/13/2022   UTI (urinary tract infection) 12/13/2022   LBBB (left bundle branch block) 06/11/2021   Typical atrial flutter (HCC)    Abdominal aortic aneurysm (HCC) 02/20/2014   Encounter for therapeutic drug monitoring 08/03/2013   Bruit 02/14/2013   Hypertension    Nonischemic cardiomyopathy (HCC) 02/03/2011   Biventricular implantable cardioverter-defibrillator in situ 08/04/2010   Atrial fibrillation (HCC) 06/09/2010   SYSTOLIC HEART FAILURE, CHRONIC 01/26/2010   Essential hypertension, benign 05/07/2009   AORTIC VALVE REPLACEMENT, HX OF 05/07/2009   SLEEP APNEA, OBSTRUCTIVE 05/06/2009   Aortic valve disorder 05/06/2009   Chronic combined systolic and diastolic heart failure  (HCC) 97/98/7988   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/06/2009   Calculus of gallbladder 05/06/2009   Osteoarthritis 05/06/2009   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   SHORTNESS OF BREATH 05/06/2009    Orientation RESPIRATION BLADDER Height & Weight     Self, Time, Situation, Place  O2 (2L/min) Incontinent Weight: 203 lb 11.3 oz (92.4 kg) Height:  5' 10 (177.8 cm)  BEHAVIORAL SYMPTOMS/MOOD NEUROLOGICAL BOWEL NUTRITION STATUS      Continent Diet (Regular diet)  AMBULATORY STATUS COMMUNICATION OF NEEDS Skin   Extensive Assist Verbally Skin abrasions, Other (Comment) (Abrasion: right hand; Ecchymosis: bilateral arms & legs, back; Erythema: bilateral arms & legs)                       Personal Care Assistance Level of Assistance  Bathing, Feeding, Dressing Bathing Assistance: Limited assistance Feeding assistance: Independent Dressing Assistance: Limited assistance     Functional Limitations Info  Sight, Hearing, Speech Sight Info: Impaired Hearing Info: Impaired Speech Info: Adequate    SPECIAL CARE FACTORS FREQUENCY  PT (By licensed PT), OT (By licensed OT)     PT Frequency: 5x's/week OT Frequency: 5x's/week            Contractures Contractures Info: Not present    Additional Factors Info  Code Status, Allergies, Insulin  Sliding Scale Code Status Info: Full Allergies Info: NKA   Insulin  Sliding Scale Info: See discharge summary       Current Medications (11/21/2023):  This is the current hospital active medication list Current Facility-Administered Medications  Medication Dose Route Frequency Provider Last Rate Last Admin   acetaminophen  (TYLENOL ) tablet  650 mg  650 mg Oral Q6H PRN Patsy Lenis, MD   650 mg at 11/18/23 2235   Or   acetaminophen  (TYLENOL ) suppository 650 mg  650 mg Rectal Q6H PRN Patsy Lenis, MD       carvedilol  (COREG ) tablet 12.5 mg  12.5 mg Oral BID Mealor, Augustus E, MD   12.5 mg at 11/21/23 0920   enoxaparin  (LOVENOX ) injection  30 mg  30 mg Subcutaneous Daily Patsy Lenis, MD   30 mg at 11/21/23 0920   insulin  aspart (novoLOG ) injection 0-5 Units  0-5 Units Subcutaneous QHS Girguis, David, MD   2 Units at 11/18/23 2118   insulin  aspart (novoLOG ) injection 0-9 Units  0-9 Units Subcutaneous TID WC Patsy Lenis, MD   2 Units at 11/21/23 9266   lactated ringers  infusion   Intravenous Continuous Geralynn Charleston, MD 65 mL/hr at 11/21/23 0727 New Bag at 11/21/23 0727   levalbuterol  (XOPENEX ) nebulizer solution 0.63 mg  0.63 mg Nebulization Q6H PRN Patsy Lenis, MD   0.63 mg at 11/21/23 0434   metoprolol  tartrate (LOPRESSOR ) injection 5 mg  5 mg Intravenous Q2H PRN Patsy Lenis, MD   5 mg at 11/17/23 2344   ondansetron  (ZOFRAN ) injection 4 mg  4 mg Intravenous Q6H PRN Patsy Lenis, MD       oxyCODONE  (Oxy IR/ROXICODONE ) immediate release tablet 5 mg  5 mg Oral Q6H PRN Patsy Lenis, MD   5 mg at 11/20/23 9471     Discharge Medications: Please see discharge summary for a list of discharge medications.  Relevant Imaging Results:  Relevant Lab Results:   Additional Information SSN: [id number]  Duwaine GORMAN Aran, LCSW

## 2023-11-21 NOTE — Progress Notes (Signed)
 Physical Therapy Treatment Patient Details Name: [name] MRN: [id number] DOB: 13-Apr-1940 Today's Date: 11/21/2023   History of Present Illness [name] is a 83 y.o. male presenting on 11/16/23 with complaints of abdominal pain and vomiting. Pt with episode of confusion, shaking, hypoxia, and tachycardia overnight and seen by cardiology 8/15 for the evaluation of elevated troponin.  Past medical history significant of aortic stenosis status post porcine aortic valve replacement, paroxysmal A-fib/flutter, LBBB, nonischemic cardiomyopathy with last EF improved to 50-55%, ICD, type 2 diabetes, hypertension, OSA, history of cholecystectomy and appendectomy    PT Comments   Pt admitted with above diagnosis.  Pt currently with functional limitations due to the deficits listed below (see PT Problem List). Pt seated in recliner when PT arrived. Pt motivated to participate with therapy. Pt states he feels like he could go home and spouse mentioned as early as this afternoon. Pt required min cues and S for sit to stand  from recliner, gait tasks in hallway 400 feet with RW and CGA progressing to close S and min cues no overt LOB. Pt reported fatigued and breathing heavily with O2 saturation on RA briefly decreasing to 89%. Pt left seated on commode, spouse and visitor in room, pt instructed to use pull cord and to await staff supervision and assist and verbalized understanding. PT made nurse aware pt seated on commode.  Pt will benefit from acute skilled PT to increase their independence and safety with mobility to allow discharge.      If plan is discharge home, recommend the following: Assistance with cooking/housework;Help with stairs or ramp for entrance;A little help with walking and/or transfers;A little help with bathing/dressing/bathroom;Assist for transportation   Can travel by private vehicle     Yes  Equipment Recommendations  None recommended by PT    Recommendations for Other  Services       Precautions / Restrictions Precautions Precautions: Fall Precaution/Restrictions Comments: monitor HR and sats Restrictions Weight Bearing Restrictions Per Provider Order: No     Mobility  Bed Mobility               General bed mobility comments: pt seated in recliner when PT arrived    Transfers Overall transfer level: Needs assistance Equipment used: None Transfers: Sit to/from Stand Sit to Stand: Supervision           General transfer comment: min cues for proper UE and AD placement    Ambulation/Gait Ambulation/Gait assistance: Contact guard assist, Supervision Gait Distance (Feet): 400 Feet Assistive device: Rolling walker (2 wheels) Gait Pattern/deviations: Step-through pattern Gait velocity: decreased     General Gait Details: mild gait devaitaions with shortened stride length and slight trunk flexion min cues for posture, safety and RW management   Stairs             Wheelchair Mobility     Tilt Bed    Modified Rankin (Stroke Patients Only)       Balance Overall balance assessment: Needs assistance Sitting-balance support: Feet supported Sitting balance-Leahy Scale: Good     Standing balance support: Bilateral upper extremity supported, During functional activity, Reliant on assistive device for balance Standing balance-Leahy Scale: Fair Standing balance comment: static standing 1 UE support at [employer] Communication Communication: No apparent difficulties  Cognition Arousal: Alert Behavior During Therapy: [facility] for tasks  assessed/performed   PT - Cognitive impairments: No apparent impairments                         Following commands: Intact      Cueing    Exercises      General Comments        Pertinent Vitals/Pain Pain Assessment Pain Assessment: 0-10 Pain Score: 5  Pain Location: tailbone from fall Pain Descriptors / Indicators: Sore,  Grimacing, Dull Pain Intervention(s): Limited activity within patient's tolerance, Monitored during session, Repositioned    Home Living                          Prior Function            PT Goals (current goals can now be found in the care plan section) Acute Rehab PT Goals PT Goal Formulation: With patient Time For Goal Achievement: 12/02/23 Potential to Achieve Goals: Good Progress towards PT goals: Progressing toward goals    Frequency    Min 3X/week      PT Plan      Co-evaluation              AM-PAC PT 6 Clicks Mobility   Outcome Measure  Help needed turning from your back to your side while in a flat bed without using bedrails?: A Lot Help needed moving from lying on your back to sitting on the side of a flat bed without using bedrails?: A Lot Help needed moving to and from a bed to a chair (including a wheelchair)?: A Little Help needed standing up from a chair using your arms (e.g., wheelchair or bedside chair)?: A Little Help needed to walk in hospital room?: A Little Help needed climbing 3-5 steps with a railing? : A Little 6 Click Score: 16    End of Session Equipment Utilized During Treatment: Gait belt Activity Tolerance: Patient tolerated treatment well Patient left: Other (comment);with family/visitor present (seated on commode and instructed to use pull cord and await staff supervision and assist)   PT Visit Diagnosis: Difficulty in walking, not elsewhere classified (R26.2);Unsteadiness on feet (R26.81)     Time: 8859-8794 PT Time Calculation (min) (ACUTE ONLY): 25 min  Charges:    $Gait Training: 8-22 mins $Therapeutic Activity: 8-22 mins PT General Charges $$ ACUTE PT VISIT: 1 Visit                     [place], PT Acute Rehab    [place] VEAR Drone 11/21/2023, 12:50 PM

## 2023-11-21 NOTE — TOC Transition Note (Signed)
 Transition of Care [facility]) - Discharge Note  Patient Details  Name: [name] MRN: [id number] Date of Birth: 12-15-1940  Transition of Care [facility]) CM/SW Contact:  Duwaine GORMAN Aran, LCSW Phone Number: 11/21/2023, 10:58 AM  Clinical Narrative: PT evaluation initially recommended SNF and patient was agreeable, but patient will now be discharging home with wife. Patient agreeable to CSW setting up HHPT and does not have an agency preference. CSW made referral to Amy with [facility], which was accepted. Hospitalist placed HHPT orders. Care management signing off.  Final next level of care: Home w Home Health Services Barriers to Discharge: Barriers Resolved  Patient Goals and CMS Choice Patient states their goals for this hospitalization and ongoing recovery are:: Go home with [facility] CMS Medicare.gov Compare Post Acute Care list provided to:: Patient Choice offered to / list presented to : Patient  Discharge Plan and Services Additional resources added to the After Visit Summary for   In-house Referral: Clinical Social Work Post Acute Care Choice: Skilled Nursing Facility          DME Arranged: N/A DME Agency: NA HH Arranged: PT HH Agency: [facility] Date HH Agency Contacted: 11/21/23 Time HH Agency Contacted: 1051 Representative spoke with at [facility] Agency: Amy  Social Drivers of Health (SDOH) Interventions SDOH Screenings   Food Insecurity: No Food Insecurity (11/17/2023)  Housing: Low Risk  (11/17/2023)  Transportation Needs: No Transportation Needs (11/17/2023)  Utilities: Not At Risk (11/17/2023)  Depression (PHQ2-9): Low Risk  (01/05/2023)  Social Connections: Socially Integrated (11/17/2023)  Tobacco Use: Medium Risk (11/16/2023)   Readmission Risk Interventions     No data to display

## 2023-11-21 NOTE — Progress Notes (Signed)
 [facility] Progress Note  Subjective:  Creat down 1.7 Up in chair eating breakfast  Vitals:   11/21/23 0431 11/21/23 0615 11/21/23 0626 11/21/23 0629  BP: (!) 123/59     Pulse: 74     Resp:      Temp: 97.6 F (36.4 C)     TempSrc: Oral     SpO2: 92% 92% (!) 88% 96%  Weight: 92.4 kg     Height:        Exam: Gen alert, no distress, Powell O2 Chest wheezing better, faint crackles at bases RRR no MRG Abd soft ntnd no mass or ascites +bs GU condom cath w/ dark yellow urine Ext trace LE edema Neuro is alert, Ox 3 , nf     Home bp meds: Coreg  12.5 bid Farxiga  Lasix  40mg  every day Advil  prn  Lisinopril  40mg  every day Aldactone  12.5 qd   Date                             Creat               eGFR (ml/min) 2008- 2015                  0.80- 1.20 2016- 2018                  1.06- 1.62 2019- 2021                  1.14- 1.31         2022                  1.21 2023                  1.20- 1.32 Sept 2024                    1.38 >> 1.09 Apr 2023                     1.15- 1.51 Feb                              1.30                 55 ml/min Mar                              1.38 07/25/23                        1.31                 54 ml/min 8/13                             1.20                 > 60  8/14                             1.61 8/15                             2.45 11/19/23  3.53    8/16 UA: neg LE/ nit, neg protein 8/15 UNa: 11,  UCr: 230  CT abd/ pelv 8/14: Urinary Tract: Kidneys demonstrate a normal enhancement pattern....no obstructive changes are noted. The ureters are within normal limits. The bladder is decompressed.  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 Creat on admission was 1.20, then rose up to 3.5 on day on consult. Had 100cc IV contrast w/ CT on 8/14. FeNa on 8/15 was low.  Pt was hypotensive 1st 48 hrs  here w/ sbp's in the 90s, and then BP's improved the last 48hrs. Was on acei at home (held here). AKI suspected due to home ACEi+ hypotension+ vol depletion + contrast injury. IVFs were continued + supportive care. Creat continues to improve, will dc IVFs. OK for dc from renal standpoint, pt can f/u w/ PCP, does not need renal f/u. Would hold the ACEi for about 2 wks then can be resumed prn. Will sign off.  Volume: no evidence of vol overload. Multiple CXRs here have been neg for edema. Cont to hold home diuretics.  Wide-complex tachycardia: seen by cardiology/ EP team.  Abd pain /N/ V/ SIRS: suspected GI viral illness.  Acute hypoxia: CTA neg for PE, no infiltrates or effusion.  Hypotension: on admission , resolved S/P CRT-D NICM: EF 20% w/ now improved to 55% H/o AVF H/o MAVE: for PAF       Rob Geralynn MD  [facility] 11/21/2023, 9:46 AM  Recent Labs  Lab 11/17/23 1350 11/18/23 0048 11/18/23 0440 11/20/23 0534 11/21/23 0452  HGB 13.2 12.9*   < > 13.4 10.1*  ALBUMIN 3.2* 3.1*  --   --   --   CALCIUM  8.4* 8.5*   < > 8.9 7.7*  CREATININE 1.61* 2.45*   < > 2.43* 1.65*  K 3.8 3.8   < > 4.4 4.3   < > = values in this interval not displayed.   No results for input(s): IRON, TIBC, FERRITIN in the last 168 hours. Inpatient medications:  carvedilol   12.5 mg Oral BID   enoxaparin  (LOVENOX ) injection  30 mg Subcutaneous Daily   insulin  aspart  0-5 Units Subcutaneous QHS   insulin  aspart  0-9 Units Subcutaneous TID WC    lactated ringers  65 mL/hr at 11/21/23 0727   acetaminophen  **OR** acetaminophen , levalbuterol , metoprolol  tartrate, ondansetron  (ZOFRAN ) IV, oxyCODONE 

## 2023-11-21 NOTE — Progress Notes (Signed)
 SATURATION QUALIFICATIONS: (This note is used to comply with regulatory documentation for home oxygen)  Patient Saturations on Room Air at Rest = 94-95%  Patient Saturations on Room Air while Ambulating = 89-94%  Pt quickly recovered to 90% with cues for pursed lip breathing and able to continue to ambulate with RW.

## 2023-11-22 LAB — CULTURE, BLOOD (ROUTINE X 2)
Culture: NO GROWTH
Culture: NO GROWTH
Special Requests: ADEQUATE
Special Requests: ADEQUATE

## 2023-11-23 ENCOUNTER — Other Ambulatory Visit: Payer: Self-pay

## 2023-11-23 ENCOUNTER — Emergency Department (HOSPITAL_COMMUNITY)
Admission: EM | Admit: 2023-11-23 | Discharge: 2023-11-23 | Discharge status: Against Medical Advice | Attending: Emergency Medicine | Admitting: Emergency Medicine

## 2023-11-23 ENCOUNTER — Emergency Department (HOSPITAL_COMMUNITY)

## 2023-11-23 DIAGNOSIS — R2243 Localized swelling, mass and lump, lower limb, bilateral: Secondary | ICD-10-CM | POA: Insufficient documentation

## 2023-11-23 DIAGNOSIS — Z5321 Procedure and treatment not carried out due to patient leaving prior to being seen by health care provider: Secondary | ICD-10-CM | POA: Diagnosis not present

## 2023-11-23 DIAGNOSIS — I509 Heart failure, unspecified: Secondary | ICD-10-CM | POA: Insufficient documentation

## 2023-11-23 DIAGNOSIS — R059 Cough, unspecified: Secondary | ICD-10-CM | POA: Diagnosis not present

## 2023-11-23 LAB — CBC WITH DIFFERENTIAL/PLATELET
Abs Immature Granulocytes: 0.17 K/uL — ABNORMAL HIGH (ref 0.00–0.07)
Basophils Absolute: 0.1 K/uL (ref 0.0–0.1)
Basophils Relative: 1 %
Eosinophils Absolute: 0.3 K/uL (ref 0.0–0.5)
Eosinophils Relative: 4 %
HCT: 41.7 % (ref 39.0–52.0)
Hemoglobin: 13.4 g/dL (ref 13.0–17.0)
Immature Granulocytes: 2 %
Lymphocytes Relative: 8 %
Lymphs Abs: 0.6 K/uL — ABNORMAL LOW (ref 0.7–4.0)
MCH: 28.8 pg (ref 26.0–34.0)
MCHC: 32.1 g/dL (ref 30.0–36.0)
MCV: 89.7 fL (ref 80.0–100.0)
Monocytes Absolute: 0.6 K/uL (ref 0.1–1.0)
Monocytes Relative: 8 %
Neutro Abs: 5.8 K/uL (ref 1.7–7.7)
Neutrophils Relative %: 77 %
Platelets: 252 K/uL (ref 150–400)
RBC: 4.65 MIL/uL (ref 4.22–5.81)
RDW: 14.2 % (ref 11.5–15.5)
WBC: 7.5 K/uL (ref 4.0–10.5)
nRBC: 0 % (ref 0.0–0.2)

## 2023-11-23 LAB — COMPREHENSIVE METABOLIC PANEL WITH GFR
ALT: 53 U/L — ABNORMAL HIGH (ref 0–44)
AST: 35 U/L (ref 15–41)
Albumin: 3.2 g/dL — ABNORMAL LOW (ref 3.5–5.0)
Alkaline Phosphatase: 136 U/L — ABNORMAL HIGH (ref 38–126)
Anion gap: 10 (ref 5–15)
BUN: 40 mg/dL — ABNORMAL HIGH (ref 8–23)
CO2: 23 mmol/L (ref 22–32)
Calcium: 8.7 mg/dL — ABNORMAL LOW (ref 8.9–10.3)
Chloride: 106 mmol/L (ref 98–111)
Creatinine, Ser: 1.21 mg/dL (ref 0.61–1.24)
GFR, Estimated: 59 mL/min — ABNORMAL LOW (ref >60)
Glucose, Bld: 260 mg/dL — ABNORMAL HIGH (ref 70–99)
Potassium: 4.2 mmol/L (ref 3.5–5.1)
Sodium: 139 mmol/L (ref 135–145)
Total Bilirubin: 1.1 mg/dL (ref 0.0–1.2)
Total Protein: 6.3 g/dL — ABNORMAL LOW (ref 6.5–8.1)

## 2023-11-23 LAB — BRAIN NATRIURETIC PEPTIDE: B Natriuretic Peptide: 253.4 pg/mL — ABNORMAL HIGH (ref 0.0–100.0)

## 2023-11-23 LAB — TROPONIN I (HIGH SENSITIVITY): Troponin I (High Sensitivity): 10 ng/L (ref <18)

## 2023-11-23 NOTE — ED Triage Notes (Signed)
 Bilateral leg swelling and weeping since this AM, recently d/c on Monday for CHF exacerbation. Denies SOB or chest pain. Has not missed any doses of lasix  at home.

## 2023-11-23 NOTE — ED Provider Triage Note (Signed)
 Emergency Medicine Provider Triage Evaluation Note  EPIFANIO LABRADOR , a 83 y.o. male  was evaluated in triage.  Pt complains of bilateral leg swelling 2 weeks, sent over by PCP for fluid overload requesting IV lasix . Last took lasix  this AM. Patient has been weeping more than previous.   Endorses cough (improved from previous admission) Denies fever, headaches, vision changes, chest pain, shortness of breath, abdominal pain, n/v/d, dysuria, numbness.  Review of Systems  Positive: N/a Negative: N/a  Physical Exam  BP (!) 144/68   Pulse 79   Temp 98.3 F (36.8 C) (Oral)   Resp 18   Ht 5' 10 (1.778 m)   Wt 92.5 kg   SpO2 97%   BMI 29.27 kg/m  Gen:   Awake, no distress   Resp:  Normal effort  MSK:   Moves extremities without difficulty  Other:    Medical Decision Making  Medically screening exam initiated at 1:39 PM.  Appropriate orders placed.  Jeison Delpilar Bares was informed that the remainder of the evaluation will be completed by another provider, this initial triage assessment does not replace that evaluation, and the importance of remaining in the ED until their evaluation is complete.     Beola Terrall RAMAN, [place] 11/23/23 [phone]

## 2023-11-24 ENCOUNTER — Other Ambulatory Visit (HOSPITAL_BASED_OUTPATIENT_CLINIC_OR_DEPARTMENT_OTHER): Payer: Self-pay

## 2023-11-24 MED ORDER — ALLOPURINOL 100 MG PO TABS
100.0000 mg | ORAL_TABLET | Freq: Every day | ORAL | 3 refills | Status: AC
  Filled 2023-11-24: qty 90, 90d supply, fill #0
  Filled 2024-02-14: qty 90, 90d supply, fill #1

## 2023-12-01 ENCOUNTER — Other Ambulatory Visit: Payer: Self-pay

## 2023-12-01 ENCOUNTER — Ambulatory Visit: Attending: Cardiology | Admitting: Cardiovascular Disease

## 2023-12-01 ENCOUNTER — Encounter: Payer: Self-pay | Admitting: Cardiovascular Disease

## 2023-12-01 ENCOUNTER — Other Ambulatory Visit (HOSPITAL_BASED_OUTPATIENT_CLINIC_OR_DEPARTMENT_OTHER): Payer: Self-pay

## 2023-12-01 VITALS — BP 120/80 | HR 75 | Ht 70.0 in | Wt 191.2 lb

## 2023-12-01 DIAGNOSIS — Z95810 Presence of automatic (implantable) cardiac defibrillator: Secondary | ICD-10-CM

## 2023-12-01 DIAGNOSIS — I428 Other cardiomyopathies: Secondary | ICD-10-CM

## 2023-12-01 DIAGNOSIS — I4819 Other persistent atrial fibrillation: Secondary | ICD-10-CM | POA: Diagnosis not present

## 2023-12-01 MED ORDER — METOPROLOL SUCCINATE ER 100 MG PO TB24
100.0000 mg | ORAL_TABLET | Freq: Every day | ORAL | 1 refills | Status: AC
  Filled 2023-12-01: qty 90, 90d supply, fill #0
  Filled 2024-03-04: qty 90, 90d supply, fill #1

## 2023-12-01 NOTE — Progress Notes (Signed)
 Electrophysiology Office Note:    Date:  12/01/2023   ID:  [name], [name] [birth year], MRN [id number]  PCP:  Vernadine Charlie ORN, MD   [facility] Providers Cardiologist:  Redell Shallow, MD Electrophysiologist:  Elspeth Sage, MD     Referring MD: Vernadine Charlie ORN, MD   History of Present Illness:    [name] is a 83 y.o. male with a medical history significant for atrial fibrillation and flutter, [employer] biventricular ICD, aortic stenosis status post AVR, left bundle branch block, nonischemic cardiomyopathy, obstructive sleep apnea referred for device and rhythm management.     Discussed the use of AI scribe software for clinical note transcription with the patient, who gave verbal consent to proceed.  History of Present Illness [name] is an 83 year old male with a cardiac device who presents for evaluation of device battery replacement and heart rhythm management.  He has a Chief Financial Officer and RV lead with Medtronic right atrial and left ventricular leads, implanted after aortic valve replacement. The current device was placed in 2016 following a recall. The device has not delivered a shock since implantation, according to the patient. The battery is nearing the end of its life. He has experienced a device change before.  EF improved after device implantation.   He takes carvedilol  for VHF GDMT and rate control. His blood pressure is stable at 120/80 mmHg but dropped too low during a recent hospital stay. He monitors his blood pressure frequently and is concerned about hypotension with increased dosage.         Today, he is at baseline and reports that he feels well.  EKGs/Labs/Other Studies Reviewed Today:     Echocardiogram:  TTE November 18, 2023 LVEF 50 to 55%.  Left atrium mildly dilated.    EKG:   EKG Interpretation Date/Time:  Thursday December 01 2023 09:22:54 EDT Ventricular Rate:  75 PR Interval:    QRS  Duration:  160 QT Interval:  466 QTC Calculation: 520 R Axis:   -38  Text Interpretation: Ventricular-paced rhythm Biventricular pacemaker detected When compared with ECG of 18-Nov-2023 08:20, No significant change was found Confirmed by Nancey Scotts [phone]) on 12/01/2023 5:08:26 PM     Physical Exam:    VS:  BP 120/80 (BP Location: Right Arm, Patient Position: Sitting, Cuff Size: Normal)   Pulse 75   Ht 5' 10 (1.778 m)   Wt 191 lb 3.2 oz (86.7 kg)   SpO2 95%   BMI 27.43 kg/m     Wt Readings from Last 3 Encounters:  12/01/23 191 lb 3.2 oz (86.7 kg)  11/23/23 204 lb (92.5 kg)  11/21/23 203 lb 11.3 oz (92.4 kg)     GEN: Well nourished, well developed in no acute distress CARDIAC: RRR, no murmurs, rubs, gallops The device site is normal -- no tenderness, edema, drainage, redness, threatened erosion.  RESPIRATORY:  Normal work of breathing MUSCULOSKELETAL: no edema    ASSESSMENT & PLAN:     Environmental manager CRT-D Placed due to LBBB, reduced EF He has a mixed system --Boston device and RV ICD lead with Medtronic RA and LV leads  BiV Pacing 31% since last interrogation Rates not well-controlled Will switch from carverdilol to metoprolol  XL 100mg   Longstanding persistent atrial flutter The rates have been trending up BiV pacing burden is suboptimal  CHFrecovered EF Possibly LBBB-induced EF has improved Will switch from carverdilol to metoprolol  XL 100mg   Device is approaching ERI Estimated  approximately 3 months battery remaining till ERI Will adjust to monthly transmissions for battery monitoring We discussed the generator change procedure.  I explained the logistics and risks of the procedure including infection, bleeding, damage to existing leads.  The risk of severe complications such as death, stroke, heart attack are extremely low.   Signed, Eulas FORBES Furbish, MD  12/01/2023 9:30 PM    [facility]

## 2023-12-01 NOTE — Patient Instructions (Signed)
 Medication Instructions:  Your physician has recommended you make the following change in your medication:   **  Stop Carvedilol   ** Begin Metoprolol  Succinate 100mg  - 1 tablet by mouth daily   *If you need a refill on your cardiac medications before your next appointment, please call your pharmacy*  Lab Work: None ordered.  If you have labs (blood work) drawn today and your tests are completely normal, you will receive your results only by: MyChart Message (if you have MyChart) OR A paper copy in the mail If you have any lab test that is abnormal or we need to change your treatment, we will call you to review the results.  Testing/Procedures: None ordered.   Follow-Up: At [facility], you and your health needs are our priority.  As part of our continuing mission to provide you with exceptional heart care, our providers are all part of one team.  This team includes your primary Cardiologist (physician) and Advanced Practice Providers or APPs (Physician Assistants and Nurse Practitioners) who all work together to provide you with the care you need, when you need it.  Your next appointment:   Follow up will be with pacemaker generator change.

## 2023-12-01 NOTE — Progress Notes (Signed)
 ekg

## 2023-12-05 ENCOUNTER — Ambulatory Visit

## 2023-12-07 LAB — CUP PACEART REMOTE DEVICE CHECK
Battery Remaining Longevity: 3 mo — CL
Battery Remaining Percentage: 3 %
Brady Statistic RA Percent Paced: 0 %
Brady Statistic RV Percent Paced: 53 %
Date Time Interrogation Session: 20250901041100
HighPow Impedance: 47 Ohm
Implantable Lead Connection Status: 753985
Implantable Lead Connection Status: 753985
Implantable Lead Connection Status: 753985
Implantable Lead Implant Date: 20111201
Implantable Lead Implant Date: 20111201
Implantable Lead Implant Date: 20111201
Implantable Lead Location: 753858
Implantable Lead Location: 753859
Implantable Lead Location: 753860
Implantable Lead Model: 158
Implantable Lead Model: 4396
Implantable Lead Model: 5076
Implantable Lead Serial Number: 302703
Implantable Pulse Generator Implant Date: 20160817
Lead Channel Impedance Value: 575 Ohm
Lead Channel Impedance Value: 696 Ohm
Lead Channel Impedance Value: 786 Ohm
Lead Channel Pacing Threshold Amplitude: 0.6 V
Lead Channel Pacing Threshold Amplitude: 1.1 V
Lead Channel Pacing Threshold Amplitude: 1.8 V
Lead Channel Pacing Threshold Pulse Width: 0.4 ms
Lead Channel Pacing Threshold Pulse Width: 0.4 ms
Lead Channel Pacing Threshold Pulse Width: 1.5 ms
Lead Channel Setting Pacing Amplitude: 2.2 V
Lead Channel Setting Pacing Amplitude: 2.4 V
Lead Channel Setting Pacing Amplitude: 2.4 V
Lead Channel Setting Pacing Pulse Width: 0.4 ms
Lead Channel Setting Pacing Pulse Width: 1.5 ms
Lead Channel Setting Sensing Sensitivity: 0.5 mV
Lead Channel Setting Sensing Sensitivity: 1 mV
Pulse Gen Serial Number: 113381

## 2023-12-08 ENCOUNTER — Other Ambulatory Visit (HOSPITAL_BASED_OUTPATIENT_CLINIC_OR_DEPARTMENT_OTHER): Payer: Self-pay

## 2023-12-08 MED ORDER — LISINOPRIL 40 MG PO TABS
40.0000 mg | ORAL_TABLET | Freq: Every day | ORAL | 3 refills | Status: AC
  Filled 2023-12-08: qty 90, 90d supply, fill #0
  Filled 2024-03-06: qty 90, 90d supply, fill #1

## 2023-12-15 ENCOUNTER — Other Ambulatory Visit (HOSPITAL_BASED_OUTPATIENT_CLINIC_OR_DEPARTMENT_OTHER): Payer: Self-pay

## 2023-12-15 ENCOUNTER — Other Ambulatory Visit: Payer: Self-pay | Admitting: Cardiology

## 2023-12-15 MED ORDER — AMOXICILLIN 500 MG PO CAPS
2000.0000 mg | ORAL_CAPSULE | Freq: Every day | ORAL | 0 refills | Status: AC
  Filled 2023-12-15: qty 20, 5d supply, fill #0

## 2023-12-16 ENCOUNTER — Other Ambulatory Visit (HOSPITAL_BASED_OUTPATIENT_CLINIC_OR_DEPARTMENT_OTHER): Payer: Self-pay

## 2023-12-16 MED ORDER — SPIRONOLACTONE 25 MG PO TABS
12.5000 mg | ORAL_TABLET | Freq: Every day | ORAL | 1 refills | Status: AC
  Filled 2023-12-16: qty 15, 30d supply, fill #0
  Filled 2023-12-16: qty 45, 90d supply, fill #0
  Filled 2024-03-07: qty 15, 30d supply, fill #1

## 2023-12-23 ENCOUNTER — Other Ambulatory Visit (HOSPITAL_BASED_OUTPATIENT_CLINIC_OR_DEPARTMENT_OTHER): Payer: Self-pay

## 2023-12-29 ENCOUNTER — Telehealth: Payer: Self-pay | Admitting: Cardiology

## 2023-12-29 NOTE — Telephone Encounter (Signed)
   Pre-operative Risk Assessment    Patient Name: KVEON CASANAS  DOB: 28-Feb-1941 MRN: [id number]   Date of last office visit: 12/01/23 Date of next office visit: Not yet scheduled   Request for Surgical Clearance    Procedure:  Routine cleaning  Date of Surgery:  Clearance 12/29/23                                Surgeon:  Dr. Louretta Gaskin Surgeon's Group or Practice Name:  [facility] Phone number:  [phone]  Fax number:  [phone]   Type of Clearance Requested:   - Medical  - Pharmacy:  Hold        Type of Anesthesia:  None    Additional requests/questions:  Caller Ilah) stated patient is in their office now and wants to have patient's teeth cleaned.  Caller stated they will need to get the make and model of patient's pacemaker for future procedures.  Signed, Jasmin B Wilson   12/29/2023, 12:46 PM

## 2023-12-29 NOTE — Telephone Encounter (Signed)
    Primary Cardiologist: Redell Shallow, MD  Chart reviewed as part of pre-operative protocol coverage. Simple dental extractions are considered low risk procedures per guidelines and generally do not require any specific cardiac clearance. It is also generally accepted that for simple extractions and dental cleanings, there is no need to interrupt blood thinner therapy.   SBE prophylaxis is required for the patient.  Device information:  [employer] BiV ICD, serial [id number]  Pulse Gen Model: G141 INOGEN CRT-D      I will route this recommendation to the requesting party via Epic fax function and remove from pre-op pool.  Please call with questions.  Josefa CHRISTELLA Beauvais, NP 12/29/2023, 1:05 PM

## 2024-01-03 ENCOUNTER — Telehealth: Payer: Self-pay

## 2024-01-03 DIAGNOSIS — I5022 Chronic systolic (congestive) heart failure: Secondary | ICD-10-CM

## 2024-01-03 DIAGNOSIS — I447 Left bundle-branch block, unspecified: Secondary | ICD-10-CM

## 2024-01-03 NOTE — Telephone Encounter (Signed)
 ICD reached replacement time 01/02/24. Had recent OV and gen change was discussed with Dr. Nancey at last OV.

## 2024-01-04 NOTE — Progress Notes (Signed)
 Remote ICD Transmission

## 2024-01-05 ENCOUNTER — Ambulatory Visit

## 2024-01-05 DIAGNOSIS — I428 Other cardiomyopathies: Secondary | ICD-10-CM

## 2024-01-05 LAB — CUP PACEART REMOTE DEVICE CHECK
Brady Statistic RA Percent Paced: 0 %
Brady Statistic RV Percent Paced: 82 %
Date Time Interrogation Session: 20251002041100
HighPow Impedance: 46 Ohm
Implantable Lead Connection Status: 753985
Implantable Lead Connection Status: 753985
Implantable Lead Connection Status: 753985
Implantable Lead Implant Date: 20111201
Implantable Lead Implant Date: 20111201
Implantable Lead Implant Date: 20111201
Implantable Lead Location: 753858
Implantable Lead Location: 753859
Implantable Lead Location: 753860
Implantable Lead Model: 158
Implantable Lead Model: 4396
Implantable Lead Model: 5076
Implantable Lead Serial Number: 302703
Implantable Pulse Generator Implant Date: 20160817
Lead Channel Impedance Value: 555 Ohm
Lead Channel Impedance Value: 692 Ohm
Lead Channel Impedance Value: 804 Ohm
Lead Channel Pacing Threshold Amplitude: 0.6 V
Lead Channel Pacing Threshold Amplitude: 1.1 V
Lead Channel Pacing Threshold Amplitude: 1.8 V
Lead Channel Pacing Threshold Pulse Width: 0.4 ms
Lead Channel Pacing Threshold Pulse Width: 0.4 ms
Lead Channel Pacing Threshold Pulse Width: 1.5 ms
Lead Channel Setting Pacing Amplitude: 2.2 V
Lead Channel Setting Pacing Amplitude: 2.4 V
Lead Channel Setting Pacing Amplitude: 2.4 V
Lead Channel Setting Pacing Pulse Width: 0.4 ms
Lead Channel Setting Pacing Pulse Width: 1.5 ms
Lead Channel Setting Sensing Sensitivity: 0.5 mV
Lead Channel Setting Sensing Sensitivity: 1 mV
Pulse Gen Serial Number: 113381

## 2024-01-11 ENCOUNTER — Telehealth (HOSPITAL_COMMUNITY): Payer: Self-pay

## 2024-01-11 NOTE — Telephone Encounter (Signed)
 Spoke with patient to discuss upcoming procedure.   Confirmed patient is scheduled for a ICD generator change on Monday, October 20 with Dr. Eulas Furbish. Instructed patient to arrive at the Main Entrance A at [facility]: [address] [place], [place] [address] and check in at Admitting at 1:00 PM.   Labs: complete by 10/13.  Any recent signs of acute illness or been started on antibiotics? No  Any new medications started? No Any medications to hold? Yes HOLD: Semaglutide  (Ozempic , Rybelsus , Wegovy ) for 7 days prior to the procedure. Last dose on Monday, October 06.   HOLD your Aspirin  for 5 days prior to procedure.  You will take your last dose on Tuesday, October 14.   HOLD: Dapagliflozin  (Farxiga ) for 3 days prior to the procedure. Last dose on Thursday, October 16. Medication instructions:  On the morning of your procedure DO NOT take Furosemide  or Spironolactone . You may take all other medications not discussed with a sip of water.   You may have a LIGHT breakfast prior to 7:00am on the morning of your procedure. Nothing to eat or drink after 7:00am except for sips of water with your medications.    The night before your procedure and the morning of your procedure, wash thoroughly with the CHG surgical soap from the neck down, paying special attention to the area where your procedure will be performed.  Advised of plan to go home the same day and will only stay overnight if medically necessary. You MUST have a responsible adult to drive you home and MUST be with you the first 24 hours after you arrive home.  Informed patient a nurse will call a day before the procedure to confirm arrival time and ensure instructions are followed.  Patient verbalized understanding to all instructions provided and agreed to proceed with procedure.

## 2024-01-12 ENCOUNTER — Telehealth: Payer: Self-pay

## 2024-01-12 LAB — CBC

## 2024-01-12 NOTE — Telephone Encounter (Signed)
Work up complete. 

## 2024-01-12 NOTE — Telephone Encounter (Signed)
-----   Message from Nurse Aldona SAUNDERS sent at 01/11/2024  8:18 AM EDT ----- Regarding: BiV-ICD gen change Important: list procedure date as first item in subject line, followed by procedure type (e.g., 12/16/23 PPM implant)  Precert:  MD: Mealor Type of implant: CRT-D Device manufacturer: [employer] Diagnosis: ERI CPT code: CRT-D change out (no LV lead change) - 33264 C-code(s), including quantity (if indicated):  Procedure scheduled (date/time): 10/20 3pm  Procedure:  Scrub given? [name] has scrub - pt will pick up 10/9 Medication instructions: Hold ASA x 5 days - Semaglutide  - last dose on 10/6 Message sent to CVRR? N/A Added to calendar? Yes Orders entered? no Letter complete? Yes Scheduled with cath lab? Yes Labs ordered (CBC, BMET, PT/INR if on warfarin)? Yes Dye allergy? No Pre-meds ordered and instructions given? No, N/A Letter method: Patient pick-up Special instructions: no H&P: in the hospital  Follow-up:  Cassie/Angel, please schedule Routine.  Covering RN:  Please send this message to [employer], EP scheduler, EP Scheduling pool, and EP [employer].

## 2024-01-13 LAB — CBC
Hematocrit: 45 % (ref 37.5–51.0)
Hemoglobin: 14.5 g/dL (ref 13.0–17.7)
MCH: 29.8 pg (ref 26.6–33.0)
MCHC: 32.2 g/dL (ref 31.5–35.7)
MCV: 92 fL (ref 79–97)
Platelets: 190 x10E3/uL (ref 150–450)
RBC: 4.87 x10E6/uL (ref 4.14–5.80)
RDW: 13.6 % (ref 11.6–15.4)
WBC: 6.5 x10E3/uL (ref 3.4–10.8)

## 2024-01-13 LAB — BASIC METABOLIC PANEL WITH GFR
BUN/Creatinine Ratio: 21 (ref 10–24)
BUN: 34 mg/dL — ABNORMAL HIGH (ref 8–27)
CO2: 23 mmol/L (ref 20–29)
Calcium: 9.1 mg/dL (ref 8.6–10.2)
Chloride: 99 mmol/L (ref 96–106)
Creatinine, Ser: 1.61 mg/dL — ABNORMAL HIGH (ref 0.76–1.27)
Glucose: 225 mg/dL — ABNORMAL HIGH (ref 70–99)
Potassium: 4.6 mmol/L (ref 3.5–5.2)
Sodium: 140 mmol/L (ref 134–144)
eGFR: 42 mL/min/1.73 — ABNORMAL LOW (ref >59)

## 2024-01-13 NOTE — Telephone Encounter (Signed)
 Pt's generator change has been scheduled for 01/23/24.  Pt is aware and received instructions.

## 2024-01-16 ENCOUNTER — Ambulatory Visit: Payer: Self-pay | Admitting: Cardiovascular Disease

## 2024-01-22 NOTE — Pre-Procedure Instructions (Signed)
 Attempted to call patient regarding procedure instructions for tomorrow.  Left voicemail on the following items: Arrival time 1130 Nothing to eat or drink after midnight No meds AM of procedure Responsible person to drive you home and stay with you for 24 hrs Wash with special soap night before and morning of procedure If on anti-coagulant drug instructions ASA- on hold for 5 days

## 2024-01-23 ENCOUNTER — Other Ambulatory Visit: Payer: Self-pay

## 2024-01-23 ENCOUNTER — Ambulatory Visit (HOSPITAL_COMMUNITY)
Admission: RE | Admit: 2024-01-23 | Discharge: 2024-01-23 | Disposition: A | Discharge status: Home / Self Care | Attending: Cardiovascular Disease | Admitting: Cardiovascular Disease

## 2024-01-23 ENCOUNTER — Other Ambulatory Visit (HOSPITAL_COMMUNITY): Payer: Self-pay

## 2024-01-23 ENCOUNTER — Ambulatory Visit (HOSPITAL_COMMUNITY)
Admission: RE | Disposition: A | Discharge status: Home / Self Care | Source: Home / Self Care | Attending: Cardiovascular Disease

## 2024-01-23 DIAGNOSIS — I428 Other cardiomyopathies: Secondary | ICD-10-CM | POA: Insufficient documentation

## 2024-01-23 DIAGNOSIS — I4892 Unspecified atrial flutter: Secondary | ICD-10-CM | POA: Diagnosis not present

## 2024-01-23 DIAGNOSIS — Z4502 Encounter for adjustment and management of automatic implantable cardiac defibrillator: Secondary | ICD-10-CM | POA: Diagnosis present

## 2024-01-23 DIAGNOSIS — I447 Left bundle-branch block, unspecified: Secondary | ICD-10-CM | POA: Insufficient documentation

## 2024-01-23 HISTORY — PX: BIV ICD GENERATOR CHANGEOUT: EP1194

## 2024-01-23 LAB — GLUCOSE, CAPILLARY: Glucose-Capillary: 180 mg/dL — ABNORMAL HIGH (ref 70–99)

## 2024-01-23 SURGERY — BIV ICD GENERATOR CHANGEOUT

## 2024-01-23 MED ORDER — CEFAZOLIN SODIUM-DEXTROSE 2-4 GM/100ML-% IV SOLN
INTRAVENOUS | Status: AC
  Filled 2024-01-23: qty 100

## 2024-01-23 MED ORDER — ONDANSETRON HCL 4 MG/2ML IJ SOLN: 4.0000 mg | Freq: Four times a day (QID) | INTRAMUSCULAR | Status: DC | PRN

## 2024-01-23 MED ORDER — POVIDONE-IODINE 10 % EX SWAB
2.0000 | Freq: Once | CUTANEOUS | Status: AC
  Administered 2024-01-23: 2 via TOPICAL

## 2024-01-23 MED ORDER — MIDAZOLAM HCL 2 MG/2ML IJ SOLN
INTRAMUSCULAR | Status: AC
  Filled 2024-01-23: qty 2

## 2024-01-23 MED ORDER — LIDOCAINE HCL (PF) 1 % IJ SOLN
INTRAMUSCULAR | Status: DC | PRN
  Administered 2024-01-23: 35 mL

## 2024-01-23 MED ORDER — SODIUM CHLORIDE 0.9% FLUSH: 3.0000 mL | Freq: Two times a day (BID) | INTRAVENOUS | Status: DC

## 2024-01-23 MED ORDER — FENTANYL CITRATE (PF) 100 MCG/2ML IJ SOLN
INTRAMUSCULAR | Status: DC | PRN
  Administered 2024-01-23: 50 ug via INTRAVENOUS

## 2024-01-23 MED ORDER — CHLORHEXIDINE GLUCONATE 4 % EX SOLN: 4.0000 | Freq: Once | CUTANEOUS | Status: DC

## 2024-01-23 MED ORDER — SODIUM CHLORIDE 0.9 % IV SOLN: INTRAVENOUS | Status: DC

## 2024-01-23 MED ORDER — FENTANYL CITRATE (PF) 100 MCG/2ML IJ SOLN
INTRAMUSCULAR | Status: AC
  Filled 2024-01-23: qty 2

## 2024-01-23 MED ORDER — MIDAZOLAM HCL 5 MG/5ML IJ SOLN
INTRAMUSCULAR | Status: DC | PRN
  Administered 2024-01-23: 1 mg via INTRAVENOUS

## 2024-01-23 MED ORDER — LIDOCAINE HCL (PF) 1 % IJ SOLN
INTRAMUSCULAR | Status: AC
  Filled 2024-01-23: qty 60

## 2024-01-23 MED ORDER — SODIUM CHLORIDE 0.9 % IV SOLN: 80.0000 mg | INTRAVENOUS | Status: DC

## 2024-01-23 MED ORDER — SODIUM CHLORIDE 0.9 % IV SOLN
INTRAVENOUS | Status: AC
  Filled 2024-01-23: qty 2

## 2024-01-23 MED ORDER — LIDOCAINE HCL (PF) 1 % IJ SOLN
INTRAMUSCULAR | Status: AC
  Filled 2024-01-23: qty 30

## 2024-01-23 MED ORDER — SODIUM CHLORIDE 0.9% FLUSH: 3.0000 mL | INTRAVENOUS | Status: DC | PRN

## 2024-01-23 MED ORDER — SODIUM CHLORIDE 0.9 % IV SOLN: 250.0000 mL | INTRAVENOUS | Status: DC | PRN

## 2024-01-23 MED ORDER — CEFAZOLIN SODIUM-DEXTROSE 2-4 GM/100ML-% IV SOLN
2.0000 g | INTRAVENOUS | Status: AC
  Administered 2024-01-23: 2 g via INTRAVENOUS

## 2024-01-23 MED ORDER — ACETAMINOPHEN 325 MG PO TABS: 650.0000 mg | ORAL_TABLET | ORAL | Status: DC | PRN

## 2024-01-23 SURGICAL SUPPLY — 5 items
CABLE SURGICAL S-101-97-12 (CABLE) ×1 IMPLANT
ICD MOMENTUM G125 (ICD Generator) IMPLANT
KIT INSTRUMENT PACEMAKER INSER (INSTRUMENTS) IMPLANT
PAD DEFIB RADIO PHYSIO CONN (PAD) ×1 IMPLANT
TRAY PACEMAKER INSERTION (PACKS) ×1 IMPLANT

## 2024-01-23 NOTE — Discharge Instructions (Addendum)

## 2024-01-23 NOTE — H&P (Signed)
 Electrophysiology Office Note:    Date:  01/23/2024   ID:  CALAHAN PAK, DOB 02/23/1941, MRN [id number]  PCP:  Vernadine Charlie ORN, MD   [facility] Providers Cardiologist:  Redell Shallow, MD Electrophysiologist:  Eulas FORBES Furbish, MD     Referring MD: No ref. provider found   History of Present Illness:    [name] is a 83 y.o. male with a medical history significant for atrial fibrillation and flutter, [employer] biventricular ICD, aortic stenosis status post AVR, left bundle branch block, nonischemic cardiomyopathy, obstructive sleep apnea referred for device and rhythm management.     Discussed the use of AI scribe software for clinical note transcription with the patient, who gave verbal consent to proceed.  History of Present Illness [name] is an 83 year old male with a cardiac device who presents for evaluation of device battery replacement and heart rhythm management.  He has a Chief Financial Officer and RV lead with Medtronic right atrial and left ventricular leads, implanted after aortic valve replacement. The current device was placed in 2016 following a recall. The device has not delivered a shock since implantation, according to the patient. The battery is nearing the end of its life. He has experienced a device change before.  EF improved after device implantation.   He takes carvedilol  for VHF GDMT and rate control. His blood pressure is stable at 120/80 mmHg but dropped too low during a recent hospital stay. He monitors his blood pressure frequently and is concerned about hypotension with increased dosage.         Today, he is at baseline and reports that he feels well. He presents for generator change. He denies having any new issues, medication changes. Recent labs were reviewed.  EKGs/Labs/Other Studies Reviewed Today:     Echocardiogram:  TTE November 18, 2023 LVEF 50 to 55%.  Left atrium mildly  dilated.    EKG:         Physical Exam:    VS:  BP 125/63   Pulse 79   Temp 97.9 F (36.6 C) (Oral)   Resp 14   Ht 5' 10 (1.778 m)   Wt 81.6 kg   SpO2 97%   BMI 25.83 kg/m     Wt Readings from Last 3 Encounters:  01/23/24 81.6 kg  12/01/23 86.7 kg  11/23/23 92.5 kg     GEN: Well nourished, well developed in no acute distress CARDIAC: RRR, no murmurs, rubs, gallops The device site is normal -- no tenderness, edema, drainage, redness, threatened erosion.  RESPIRATORY:  Normal work of breathing MUSCULOSKELETAL: no edema    ASSESSMENT & PLAN:     Environmental manager CRT-D Placed due to LBBB, reduced EF He has a mixed system --[employer] device and RV ICD lead with Medtronic RA and LV leads  BiV Pacing 31% since last interrogation Rates not well-controlled Will switch from carverdilol to metoprolol  XL 100mg   Longstanding persistent atrial flutter The rates have been trending up BiV pacing burden is suboptimal  CHFrecovered EF Possibly LBBB-induced EF has improved Will switch from carverdilol to metoprolol  XL 100mg   Device is approaching ERI Estimated approximately 3 months battery remaining till ERI Will adjust to monthly transmissions for battery monitoring We discussed the generator change procedure.  I explained the logistics and risks of the procedure including infection, bleeding, damage to existing leads.  The risk of severe complications such as death, stroke, heart attack are extremely low.   Signed,  Eulas FORBES Furbish, MD  01/23/2024 2:15 PM    [facility]

## 2024-01-24 ENCOUNTER — Encounter (HOSPITAL_COMMUNITY): Payer: Self-pay | Admitting: Cardiovascular Disease

## 2024-01-24 MED FILL — Gentamicin Sulfate Inj 40 MG/ML: INTRAMUSCULAR | Qty: 80 | Status: AC

## 2024-01-24 MED FILL — Midazolam HCl Inj 2 MG/2ML (Base Equivalent): INTRAMUSCULAR | Qty: 1 | Status: AC

## 2024-01-30 ENCOUNTER — Other Ambulatory Visit (HOSPITAL_BASED_OUTPATIENT_CLINIC_OR_DEPARTMENT_OTHER): Payer: Self-pay

## 2024-01-30 MED ORDER — OFLOXACIN 0.3 % OP SOLN
1.0000 [drp] | Freq: Three times a day (TID) | OPHTHALMIC | 3 refills | Status: AC
  Filled 2024-01-30: qty 5, 10d supply, fill #0

## 2024-02-02 ENCOUNTER — Ambulatory Visit: Attending: Student in an Organized Health Care Education/Training Program

## 2024-02-02 DIAGNOSIS — I428 Other cardiomyopathies: Secondary | ICD-10-CM | POA: Diagnosis not present

## 2024-02-02 DIAGNOSIS — I483 Typical atrial flutter: Secondary | ICD-10-CM

## 2024-02-02 DIAGNOSIS — I447 Left bundle-branch block, unspecified: Secondary | ICD-10-CM

## 2024-02-02 LAB — CUP PACEART INCLINIC DEVICE CHECK
Date Time Interrogation Session: 20251030130120
HighPow Impedance: 52 Ohm
Implantable Lead Connection Status: 753985
Implantable Lead Connection Status: 753985
Implantable Lead Connection Status: 753985
Implantable Lead Implant Date: 20111201
Implantable Lead Implant Date: 20111201
Implantable Lead Implant Date: 20111201
Implantable Lead Location: 753858
Implantable Lead Location: 753859
Implantable Lead Location: 753860
Implantable Lead Model: 158
Implantable Lead Model: 4396
Implantable Lead Model: 5076
Implantable Lead Serial Number: 302703
Implantable Pulse Generator Implant Date: 20251020
Lead Channel Impedance Value: 603 Ohm
Lead Channel Impedance Value: 756 Ohm
Lead Channel Impedance Value: 909 Ohm
Lead Channel Pacing Threshold Amplitude: 0.7 V
Lead Channel Pacing Threshold Amplitude: 2.3 V
Lead Channel Pacing Threshold Pulse Width: 0.4 ms
Lead Channel Pacing Threshold Pulse Width: 1.5 ms
Lead Channel Setting Pacing Amplitude: 2.2 V
Lead Channel Setting Pacing Amplitude: 2.6 V
Lead Channel Setting Pacing Pulse Width: 0.4 ms
Lead Channel Setting Pacing Pulse Width: 1.5 ms
Lead Channel Setting Sensing Sensitivity: 0.5 mV
Lead Channel Setting Sensing Sensitivity: 1 mV
Pulse Gen Serial Number: 161387

## 2024-02-02 NOTE — Progress Notes (Signed)
 Normal dual chamber ICD wound check post generator change. Wound well healed. Presenting rhythm: BP 75. Routine testing performed.  Thresholds, sensing, and impedance demonstrate stable parameters.  LV pacing threshold 2.3V at 1.75ms.  Increased output to 2.6V at 1.55ms.  Estimated longevity 11 years. Reviewed shock plan.  Pt enrolled in remote follow-up.

## 2024-02-02 NOTE — Patient Instructions (Signed)

## 2024-02-06 ENCOUNTER — Encounter

## 2024-02-09 ENCOUNTER — Ambulatory Visit: Payer: Self-pay | Admitting: Cardiovascular Disease

## 2024-02-23 ENCOUNTER — Other Ambulatory Visit (HOSPITAL_BASED_OUTPATIENT_CLINIC_OR_DEPARTMENT_OTHER): Payer: Self-pay

## 2024-02-23 MED ORDER — MICROLET LANCETS MISC
3 refills | Status: AC
  Filled 2024-02-23: qty 100, 90d supply, fill #0

## 2024-02-23 MED ORDER — CONTOUR NEXT TEST VI STRP
ORAL_STRIP | 3 refills | Status: AC
  Filled 2024-02-23: qty 100, 100d supply, fill #0

## 2024-02-23 MED ORDER — CONTOUR NEXT ONE W/DEVICE KIT
PACK | 0 refills | Status: AC
  Filled 2024-02-23: qty 1, 30d supply, fill #0

## 2024-02-24 ENCOUNTER — Other Ambulatory Visit (HOSPITAL_BASED_OUTPATIENT_CLINIC_OR_DEPARTMENT_OTHER): Payer: Self-pay

## 2024-03-05 ENCOUNTER — Ambulatory Visit: Attending: Cardiovascular Disease

## 2024-03-05 DIAGNOSIS — I428 Other cardiomyopathies: Secondary | ICD-10-CM

## 2024-03-06 LAB — CUP PACEART REMOTE DEVICE CHECK
Battery Remaining Longevity: 114 mo
Battery Remaining Percentage: 100 %
Brady Statistic RA Percent Paced: 0 %
Brady Statistic RV Percent Paced: 92 %
Date Time Interrogation Session: 20251201041100
HighPow Impedance: 46 Ohm
Implantable Lead Connection Status: 753985
Implantable Lead Connection Status: 753985
Implantable Lead Connection Status: 753985
Implantable Lead Implant Date: 20111201
Implantable Lead Implant Date: 20111201
Implantable Lead Implant Date: 20111201
Implantable Lead Location: 753858
Implantable Lead Location: 753859
Implantable Lead Location: 753860
Implantable Lead Model: 158
Implantable Lead Model: 4396
Implantable Lead Model: 5076
Implantable Lead Serial Number: 302703
Implantable Pulse Generator Implant Date: 20251020
Lead Channel Impedance Value: 564 Ohm
Lead Channel Impedance Value: 657 Ohm
Lead Channel Impedance Value: 849 Ohm
Lead Channel Pacing Threshold Amplitude: 0.7 V
Lead Channel Pacing Threshold Amplitude: 2.4 V
Lead Channel Pacing Threshold Pulse Width: 0.4 ms
Lead Channel Pacing Threshold Pulse Width: 1.5 ms
Lead Channel Setting Pacing Amplitude: 2.2 V
Lead Channel Setting Pacing Amplitude: 2.6 V
Lead Channel Setting Pacing Pulse Width: 0.4 ms
Lead Channel Setting Pacing Pulse Width: 1.5 ms
Lead Channel Setting Sensing Sensitivity: 0.5 mV
Lead Channel Setting Sensing Sensitivity: 1 mV
Pulse Gen Serial Number: 161387

## 2024-03-07 ENCOUNTER — Other Ambulatory Visit (HOSPITAL_BASED_OUTPATIENT_CLINIC_OR_DEPARTMENT_OTHER): Payer: Self-pay

## 2024-03-08 ENCOUNTER — Ambulatory Visit: Payer: Self-pay | Admitting: Cardiovascular Disease

## 2024-03-08 ENCOUNTER — Encounter

## 2024-03-09 NOTE — Progress Notes (Signed)
 Remote ICD transmission.

## 2024-03-09 NOTE — Progress Notes (Signed)
 Remote ICD Transmission

## 2024-03-19 ENCOUNTER — Other Ambulatory Visit (HOSPITAL_BASED_OUTPATIENT_CLINIC_OR_DEPARTMENT_OTHER): Payer: Self-pay

## 2024-03-21 ENCOUNTER — Other Ambulatory Visit (HOSPITAL_BASED_OUTPATIENT_CLINIC_OR_DEPARTMENT_OTHER): Payer: Self-pay

## 2024-03-21 ENCOUNTER — Other Ambulatory Visit: Payer: Self-pay

## 2024-03-21 MED ORDER — DAPAGLIFLOZIN PROPANEDIOL 10 MG PO TABS
10.0000 mg | ORAL_TABLET | Freq: Every day | ORAL | 3 refills | Status: AC
  Filled 2024-03-21: qty 90, 90d supply, fill #0

## 2024-03-21 MED ORDER — OZEMPIC (2 MG/DOSE) 8 MG/3ML ~~LOC~~ SOPN
3.0000 mL | PEN_INJECTOR | SUBCUTANEOUS | 3 refills | Status: AC
  Filled 2024-03-21: qty 9, 84d supply, fill #0
  Filled 2024-04-23: qty 3, 28d supply, fill #0

## 2024-03-23 ENCOUNTER — Telehealth: Payer: Self-pay

## 2024-03-23 NOTE — Telephone Encounter (Signed)
 Pt was advised to send a manual transmission.  Await transmission.  Last report received to [facility] on December 15 indicated no arrhythmias.

## 2024-03-23 NOTE — Telephone Encounter (Signed)
 Pt called in stating he thinks that he got shocked a couple weeks ago and was calling in to see if he truly did

## 2024-03-26 NOTE — Telephone Encounter (Signed)
 Transmission received.  No events noted.  Advised Pt.  Per Pt a couple of weeks ago he felt funny/dizzy and then get hot.  States after a few minutes he felt fine.  Advised of normal device transmission.  Advised to always call device clinic if he thinks he got a shock.

## 2024-03-31 ENCOUNTER — Other Ambulatory Visit: Payer: Self-pay

## 2024-03-31 ENCOUNTER — Emergency Department (HOSPITAL_BASED_OUTPATIENT_CLINIC_OR_DEPARTMENT_OTHER)
Admission: EM | Admit: 2024-03-31 | Discharge: 2024-03-31 | Discharge status: Against Medical Advice | Attending: Emergency Medicine | Admitting: Emergency Medicine

## 2024-03-31 ENCOUNTER — Encounter (HOSPITAL_BASED_OUTPATIENT_CLINIC_OR_DEPARTMENT_OTHER): Payer: Self-pay

## 2024-03-31 DIAGNOSIS — Z5321 Procedure and treatment not carried out due to patient leaving prior to being seen by health care provider: Secondary | ICD-10-CM | POA: Diagnosis not present

## 2024-03-31 DIAGNOSIS — S80822A Blister (nonthermal), left lower leg, initial encounter: Secondary | ICD-10-CM | POA: Insufficient documentation

## 2024-03-31 DIAGNOSIS — X58XXXA Exposure to other specified factors, initial encounter: Secondary | ICD-10-CM | POA: Diagnosis not present

## 2024-03-31 NOTE — ED Triage Notes (Signed)
 Reports a blister is starting on L shin since this afternoon. Denies pain, drainage, fevers  Approx 2 in in length

## 2024-04-09 ENCOUNTER — Encounter

## 2024-04-23 ENCOUNTER — Other Ambulatory Visit: Payer: Self-pay

## 2024-04-23 ENCOUNTER — Other Ambulatory Visit (HOSPITAL_BASED_OUTPATIENT_CLINIC_OR_DEPARTMENT_OTHER): Payer: Self-pay

## 2024-05-02 ENCOUNTER — Encounter: Payer: Self-pay | Admitting: Cardiovascular Disease

## 2024-05-02 ENCOUNTER — Ambulatory Visit: Attending: Cardiovascular Disease | Admitting: Cardiovascular Disease

## 2024-05-02 VITALS — BP 136/70 | HR 75 | Ht 70.0 in | Wt 191.0 lb

## 2024-05-02 DIAGNOSIS — I483 Typical atrial flutter: Secondary | ICD-10-CM | POA: Diagnosis not present

## 2024-05-02 DIAGNOSIS — I447 Left bundle-branch block, unspecified: Secondary | ICD-10-CM | POA: Diagnosis not present

## 2024-05-02 DIAGNOSIS — I4819 Other persistent atrial fibrillation: Secondary | ICD-10-CM

## 2024-05-02 DIAGNOSIS — I428 Other cardiomyopathies: Secondary | ICD-10-CM

## 2024-05-02 LAB — CUP PACEART INCLINIC DEVICE CHECK
Date Time Interrogation Session: 20260128120512
HighPow Impedance: 54 Ohm
Implantable Lead Connection Status: 753985
Implantable Lead Connection Status: 753985
Implantable Lead Connection Status: 753985
Implantable Lead Implant Date: 20111201
Implantable Lead Implant Date: 20111201
Implantable Lead Implant Date: 20111201
Implantable Lead Location: 753858
Implantable Lead Location: 753859
Implantable Lead Location: 753860
Implantable Lead Model: 158
Implantable Lead Model: 4396
Implantable Lead Model: 5076
Implantable Lead Serial Number: 302703
Implantable Pulse Generator Implant Date: 20251020
Lead Channel Impedance Value: 601 Ohm
Lead Channel Impedance Value: 766 Ohm
Lead Channel Impedance Value: 919 Ohm
Lead Channel Pacing Threshold Amplitude: 0.7 V
Lead Channel Pacing Threshold Amplitude: 2.3 V
Lead Channel Pacing Threshold Pulse Width: 0.4 ms
Lead Channel Pacing Threshold Pulse Width: 1.5 ms
Lead Channel Sensing Intrinsic Amplitude: 12.5 mV
Lead Channel Sensing Intrinsic Amplitude: 25 mV
Lead Channel Setting Pacing Amplitude: 2.2 V
Lead Channel Setting Pacing Amplitude: 2.6 V
Lead Channel Setting Pacing Pulse Width: 0.4 ms
Lead Channel Setting Pacing Pulse Width: 1.5 ms
Lead Channel Setting Sensing Sensitivity: 0.5 mV
Lead Channel Setting Sensing Sensitivity: 1 mV
Pulse Gen Serial Number: 161387

## 2024-05-02 NOTE — Patient Instructions (Signed)
 Medication Instructions:  Your physician recommends that you continue on your current medications as directed. Please refer to the Current Medication list given to you today.  *If you need a refill on your cardiac medications before your next appointment, please call your pharmacy*  Lab Work: None ordered.  If you have labs (blood work) drawn today and your tests are completely normal, you will receive your results only by: MyChart Message (if you have MyChart) OR A paper copy in the mail If you have any lab test that is abnormal or we need to change your treatment, we will call you to review the results.  Testing/Procedures: None ordered.   Follow-Up: At [facility], you and your health needs are our priority.  As part of our continuing mission to provide you with exceptional heart care, our providers are all part of one team.  This team includes your primary Cardiologist (physician) and Advanced Practice Providers or APPs (Physician Assistants and Nurse Practitioners) who all work together to provide you with the care you need, when you need it.  Your next appointment:   6 months with Dr Mealor

## 2024-05-02 NOTE — Progress Notes (Signed)
" °  Electrophysiology Office Note:    Date:  05/02/2024   ID:  [name], DOB 01-25-1941, MRN [id number]  PCP:  Vernadine Charlie ORN, MD   [facility] Providers Cardiologist:  Redell Shallow, MD Electrophysiologist:  Eulas FORBES Furbish, MD     Referring MD: Vernadine Charlie ORN, MD   History of Present Illness:    [name] is a 84 y.o. male with a medical history significant for atrial fibrillation and flutter, [employer] biventricular ICD, aortic stenosis status post AVR, left bundle branch block, nonischemic cardiomyopathy, obstructive sleep apnea referred for device and rhythm management.      History of Present Illness [name] is an 84 year old male with a cardiac device who presents for evaluation of device battery replacement and heart rhythm management.  He has a Chief Financial Officer and RV lead with Medtronic right atrial and left ventricular leads, implanted after aortic valve replacement. The current device was placed in 2016 following a recall. The device has not delivered a shock since implantation, according to the patient. The battery is nearing the end of its life. He has experienced a device change before.  EF improved after device implantation.   He takes carvedilol  for VHF GDMT and rate control. His blood pressure is stable at 120/80 mmHg but dropped too low during a recent hospital stay. He monitors his blood pressure frequently and is concerned about hypotension with increased dosage.  Status post generator change October 2025         Today, he is at baseline and reports that he feels well.  EKGs/Labs/Other Studies Reviewed Today:     Echocardiogram:  TTE November 18, 2023 LVEF 50 to 55%.  Left atrium mildly dilated.    EKG:   EKG Interpretation Date/Time:  Wednesday May 02 2024 11:00:35 EST Ventricular Rate:  75 PR Interval:    QRS Duration:  194 QT Interval:  474 QTC Calculation: 529 R  Axis:   -40  Text Interpretation: Ventricular-paced rhythm Biventricular pacemaker detected When compared with ECG of 01-Dec-2023 09:22, No significant change was found Confirmed by Furbish Eulas [phone]) on 05/02/2024 11:14:16 AM     Physical Exam:    VS:  BP 136/70 (BP Location: Right Arm, Patient Position: Sitting, Cuff Size: Large)   Pulse 75   Ht 5' 10 (1.778 m)   Wt 191 lb (86.6 kg)   SpO2 96%   BMI 27.41 kg/m     Wt Readings from Last 3 Encounters:  05/02/24 191 lb (86.6 kg)  01/23/24 180 lb (81.6 kg)  12/01/23 191 lb 3.2 oz (86.7 kg)     GEN: Well nourished, well developed in no acute distress CARDIAC: RRR, no murmurs, rubs, gallops The device site is normal -- no tenderness, edema, drainage, redness, threatened erosion.  RESPIRATORY:  Normal work of breathing MUSCULOSKELETAL: no edema    ASSESSMENT & PLAN:     Environmental Manager CRT-D Placed due to LBBB, reduced EF He has a mixed system --[employer] device and RV ICD lead with Medtronic RA and LV leads  BiV Pacing improved from 31% to 95% on metoprolol  XL 100 mg He is not pacemaker dependent today   Longstanding persistent atrial flutter Rates and BiV pacing percentage significantly improved on metoprolol  100 mg  CHFrecovered EF Possibly LBBB-induced EF has improved Continue metoprolol  XL 100mg      Signed, Eulas FORBES Furbish, MD  05/02/2024 11:14 AM    [facility] "

## 2024-05-03 ENCOUNTER — Ambulatory Visit: Payer: Self-pay | Admitting: Cardiovascular Disease

## 2024-05-07 ENCOUNTER — Encounter

## 2024-05-23 ENCOUNTER — Ambulatory Visit: Admitting: Cardiology

## 2024-06-04 ENCOUNTER — Ambulatory Visit

## 2024-08-06 ENCOUNTER — Encounter

## 2024-09-03 ENCOUNTER — Ambulatory Visit

## 2024-11-05 ENCOUNTER — Encounter

## 2024-12-03 ENCOUNTER — Ambulatory Visit

## 2025-03-04 ENCOUNTER — Ambulatory Visit

## 2025-06-03 ENCOUNTER — Ambulatory Visit
